# Patient Record
Sex: Female | Born: 1940 | Race: White | Hispanic: No
Health system: Midwestern US, Community
[De-identification: ages and names within clinical notes are randomized; demographics above are authoritative.]

## PROBLEM LIST (undated history)

## (undated) DIAGNOSIS — C50919 Malignant neoplasm of unspecified site of unspecified female breast: Secondary | ICD-10-CM

## (undated) DIAGNOSIS — R634 Abnormal weight loss: Secondary | ICD-10-CM

## (undated) DIAGNOSIS — R609 Edema, unspecified: Secondary | ICD-10-CM

## (undated) DIAGNOSIS — F419 Anxiety disorder, unspecified: Secondary | ICD-10-CM

## (undated) DIAGNOSIS — M25572 Pain in left ankle and joints of left foot: Secondary | ICD-10-CM

## (undated) DIAGNOSIS — Z853 Personal history of malignant neoplasm of breast: Secondary | ICD-10-CM

## (undated) DIAGNOSIS — Z780 Asymptomatic menopausal state: Secondary | ICD-10-CM

## (undated) DIAGNOSIS — G8929 Other chronic pain: Secondary | ICD-10-CM

## (undated) DIAGNOSIS — R319 Hematuria, unspecified: Secondary | ICD-10-CM

## (undated) DIAGNOSIS — R300 Dysuria: Secondary | ICD-10-CM

## (undated) DIAGNOSIS — Z1231 Encounter for screening mammogram for malignant neoplasm of breast: Secondary | ICD-10-CM

## (undated) DIAGNOSIS — Z8542 Personal history of malignant neoplasm of other parts of uterus: Secondary | ICD-10-CM

## (undated) DIAGNOSIS — L299 Pruritus, unspecified: Secondary | ICD-10-CM

## (undated) DIAGNOSIS — R079 Chest pain, unspecified: Secondary | ICD-10-CM

## (undated) DIAGNOSIS — E785 Hyperlipidemia, unspecified: Secondary | ICD-10-CM

## (undated) HISTORY — PX: BREAST BIOPSY: SHX20

## (undated) HISTORY — DX: Malignant neoplasm of unspecified site of unspecified female breast: C50.919

## (undated) HISTORY — PX: MASTECTOMY: SHX3

## (undated) HISTORY — PX: MASTECTOMY W/ SENTINEL NODE BIOPSY: SHX2001

---

## 2007-06-30 ENCOUNTER — Inpatient Hospital Stay (HOSPITAL_COMMUNITY): Admission: EM | Admit: 2007-06-30 | Discharge: 2007-07-02 | Payer: Self-pay | Admitting: Emergency Medicine

## 2008-12-14 LAB — CEA: CEA: 3.3 ng/mL (ref 0.0–5.0)

## 2008-12-16 LAB — CANCER ANTIGEN 125: CA-125: 9 U/mL (ref 0–35)

## 2008-12-16 LAB — AFP, TUMOR MARKER
AFP (TUMOR MARKER): 2 ng/mL (ref 0–9)
AFP Tumor Marker: 2 ng/mL (ref 0–9)

## 2008-12-16 LAB — HCG TUMOR MARKER: Beta-HCG(Tumor Marker): <1 IU/L (ref 0–5)

## 2008-12-16 LAB — HCG, TUMOR MARKER: BETA-HCG,HCGT: <1 IU/L (ref 0–5)

## 2009-02-09 LAB — T4, FREE: T4, Free: 1.1 NG/DL (ref 0.89–1.76)

## 2009-02-09 LAB — TSH 3RD GENERATION: TSH: 2.05 u[IU]/mL (ref 0.51–6.27)

## 2009-02-09 LAB — SED RATE (ESR): Sed rate (ESR): 5 MM/HR (ref 0–30)

## 2009-02-10 LAB — VITAMIN D, 25 HYDROXY: Vitamin D 25-Hydroxy: 25 ng/mL — ABNORMAL LOW (ref 30–80)

## 2009-02-10 LAB — ANA QL, W/REFLEX CASCADE
ANA, Direct: NOT DETECTED
ANA: NOT DETECTED

## 2009-02-11 LAB — RHEUMATOID FACTOR, QL: Rheumatoid factor, QL: NEGATIVE

## 2009-02-13 LAB — CARDIOLIPIN AND PHOSPHATIDYL AB PANEL
Cardiolipin Ab,IgA: 5 [APL'U] (ref 0–11)
Cardiolipin Ab,IgG: 15 [GPL'U] — ABNORMAL HIGH (ref 0–14)
Cardiolipin Ab,IgM: 5 [MPL'U] (ref 0–12)
Phosphatidylethanolamine,IgA: 4 U/mL (ref 0–15)
Phosphatidylethanolamine,IgG: 8 U/mL (ref 0–15)
Phosphatidylethanolamine,IgM: 9 U/mL (ref 0–15)
Phosphatidylglycerol,IgA: 0 U/mL (ref 0–15)
Phosphatidylglycerol,IgG: 4 U/mL (ref 0–15)
Phosphatidylglycerol,IgM: 4 U/mL (ref 0–15)
Phosphatidylinositol,IgA: 0 U/mL (ref 0–15)
Phosphatidylinositol,IgG: 2 U/mL (ref 0–15)
Phosphatidylinositol,IgM: 1 U/mL (ref 0–15)
Phosphatidylserine,IgA: 4 U/mL (ref 0–19)
Phosphatidylserine,IgG: 13 U/mL — ABNORMAL HIGH (ref 0–10)
Phosphatidylserine,IgM: 9 U/mL (ref 0–24)

## 2010-04-11 NOTE — Progress Notes (Signed)
Pt given flushot per Dr RD

## 2010-09-09 NOTE — Consult Note (Signed)
Jodi, Kaufman              ACCOUNT NO.:  [id number]   MEDICAL RECORD NO.:  [id number]          PATIENT TYPE:  INP   LOCATION:  0106                         FACILITY:  [facility]   PHYSICIAN:  Jordan Hawks. Elnoria Howard, MD    DATE OF BIRTH:  [birth year]   DATE OF CONSULTATION:  06/30/2007  DATE OF DISCHARGE:                                 CONSULTATION   REFERRING PHYSICIAN:  [facility] hospitalist.   REASON FOR CONSULTATION:  Hematochezia.   HISTORY OF PRESENT ILLNESS:  This is a 70 year old female with a past  medical history of gastroesophageal reflux disease and colonic polyps  and remote history of breast cancer, status post mastectomy, who was  admitted to the hospital with complaints of hematochezia.  The patient  states that it started early this morning.  Subsequently, she has had 3  bowel movements; with the first bowel movement, there was a significant  amount of clots and the subsequent 2 bowel movements have revealed a  decreased amount of bleeding.  The patient underwent a screening  colonoscopy on June 23, 2007 without any incident.  She was noted to  have 6 polyps,2  of which were rather large and 2 endoclips were placed  on 1 of the polypectomy sites.  All of the polyps were removed with  electrocautery.  Subsequently, the patient was here in [place]  visiting her daughter and she developed hematochezia.  Incidentally, the  patient does complain of having some left lower quadrant pain that has  been persistent for approximately 3 years.  Prior workup was unrevealing  for any gynecologic etiology; however, this is the first time she has  noticed any type of rectal bleeding.   PAST MEDICAL AND SURGICAL HISTORY:  As stated above.   FAMILY HISTORY:  Significant for colonic polyps in her brother.   SOCIAL HISTORY:  Negative for alcohol, tobacco or illicit drug use.   HOME MEDICATIONS:  1. Prilosec 20 mg p.o. daily.  2. Multivitamin.  3. Caltrate.   ALLERGIES:  To  AUGMENTIN and TAMOXIFEN.   REVIEW OF SYSTEMS:  Negative for the 13-point review of systems, unless  stated in the history present illness.   PHYSICAL EXAMINATION:  VITAL SIGNS:  Blood pressure is 125/69, heart  rate is 86, respirations 16, temperature is 98.4.  GENERAL:  The patient is in no acute distress, alert and oriented.  HEENT:  Normocephalic, atraumatic.  Extraocular muscles intact.  NECK:  Supple with no lymphadenopathy.  LUNGS:  Clear to auscultation bilaterally.  CARDIOVASCULAR:  Regular rate and rhythm.  ABDOMEN:  Flat, soft.  Mild tenderness in the left lower quadrant.  No  rebound or rigidity.  EXTREMITIES:  No clubbing, cyanosis or edema.   LABORATORY VALUES:  White blood cell count is 8.9, hemoglobin 13.1, MCV  is 88.5, platelets at 259,000.  PT is 13.1, INR 1, PTT 27.  Sodium 139,  potassium 4.4, chloride 106, CO2 25, glucose 104, BUN is 20, creatinine  0.69, AST is 19, ALT 14, total bilirubin 0.8, alkaline phosphatase is  59, albumin is 2.8.   IMPRESSION:  1. Probable post  polypectomy bleed.  2. Left lower quadrant pain, questionable ischemic colitis in light of      the concomitant hematochezia.  3. Diverticulosis.   After evaluation of the patient, I believe the patient is having a post  polypectomy bleed and a repeat colonoscopy is required at this time,  given the amount of blood that she reports passing, despite the  decreasing amounts of bleeding.   PLAN:  Plan at this time is to proceed with a repeat colonoscopy for  further evaluation and treatment.      Jordan Hawks Elnoria Howard, MD  Electronically Signed     PDH/MEDQ  D:  06/30/2007  T:  07/01/2007  Job:  [id number]   cc:   [address], [facility] [place], [place] [address] Amada Jupiter MD

## 2010-09-09 NOTE — Discharge Summary (Signed)
Jodi Kaufman, Jodi Kaufman              ACCOUNT NO.:  [id number]   MEDICAL RECORD NO.:  [id number]          PATIENT TYPE:  INP   LOCATION:  1433                         FACILITY:  [facility]   PHYSICIAN:  Isidor Holts, M.D.  DATE OF BIRTH:  10/07/1940   DATE OF ADMISSION:  06/30/2007  DATE OF DISCHARGE:  07/02/2007                               DISCHARGE SUMMARY   PRIMARY CARE PHYSICIAN:  Unassigned.   PRIMARY GASTROENTEROLOGIST:  Dr. Amada Jupiter, [address],  [place], [place], [address].   DISCHARGE DIAGNOSES:  1. Colonic polyposis.  2. Status post multiple colonic polypectomies June 23, 2007.  3. Post polypectomy lower gastrointestinal bleed.  4. History of diverticulosis.  5. Gastroesophageal reflux disease.   DISCHARGE MEDICATIONS:  1. Prilosec 20 mg p.o. daily.  2. Multivitamin one p.o. daily.  3. Caltrate 1-2 tablets p.o. daily.   PROCEDURES:  1. Colonoscopy performed July 01, 2007, by Dr. Jordan Hawks. Hung,      gastroenterologist.  This showed old blood upon entry into the      colon which tracked all the way up to the cecum.  Two polypectomy      sites were identified.  One site just distal to the cecal cap was      noted to have two resolution clips and the other side distal to the      initial polypectomy site.  No active bleeding was identified from      the polypectomy sites.  However, the polypectomy site with the      clips demonstrated two hemocystic spots.  Two additional resolution      clips were applied.  However, it was difficult.  The angle was      tangential to the hemocystic spots.  Additionally the fibrotic base      of the polypectomy sites made it difficult to apply the clips but      satisfactory placement was achieved.  One clip was placed on a      suspicious spot on other polypectomy site.  Some mild oozing was      noted after clip placement which was most likely from trauma of the      clip.  To ensure safety 2 mL of 1:10,000 epinephrine was  injected.      Finally a 7 mm sessile polyp was found approximately 5 cm distal to      the second polypectomy site.  This was not removed in light of      current bleeding.  Internal and external hemorrhoids were noted in      the rectum.  There were sigmoid diverticula.   CONSULTATIONS:  Jordan Hawks. Elnoria Howard, gastroenterologist.   ADMISSION HISTORY:  As in H and P notes of June 30, 2007.  However, in  brief this is a 70 year old female, with known history of right breast  cancer, diagnosed approximately 13-1/2 years ago treated with right  mastectomy.  Also colonic polyposis, diverticulosis, history of acute  diverticulitis approximately 7 years ago, GERD who presented with  recurrent hematochezia on June 30, 2007, following colonoscopy June 23, 2007, at the [facility] in [place] where a total of  6 polyps were identified and retrieved in what was described as a  difficult procedure.  She was admitted for further evaluation and  investigation management.   CLINICAL COURSE:  1. Post polypectomy lower GI bleed.  For details of presentation,      refer to admission history above.  The patient was placed on close      observation, intravenous fluids were started, bowel rest was      instituted and serial hemoglobin/hematocrits were done.  The      patient remained hemodynamically stable.  Hemoglobin remained      reasonable.  She underwent lower GI endoscopy on July 01, 2007, by      Dr. Jeani Hawking, who was called on consultation.  For the details      of findings, refer to procedure list above.  Endoclips were placed      to ensure hemostasis and an additional 7 mm sessile polyp was      identified, although this was not removed because of the patient's      recent bleed.  By July 02, 2007, the patient was completely      asymptomatic.  Hemoglobin was considered reasonable at 11.1 with a      hematocrit of 31.7.  She is recommended to follow up subsequently       within a week, with her primary gastroenterologist, Dr. Evelene Croon in [place].   1. History of colon polyps.  As mentioned above the patient was      identified to have at least one remaining 7 mm sessile polyp,      located approximately 5 cm distal to second polypectomy site.  This      was not removed in view of recent bleed.  However, it will likely      require retrieval in the future.  This will be deferred to the      patient's primary gastroenterologist.   DISPOSITION:  The patient was on July 02, 2007, considered clinically  stable for discharge.  She has been discharged accordingly.  She is  recommended to return to regular duties on July 06, 2007.  Activity:  As tolerated.  Diet:  no restrictions.   FOLLOW UP INSTRUCTIONS:  :  The patient is recommended to follow up with her primary  gastroenterologist, Dr. Evelene Croon, in [place] within 1 week of  discharge.  She is instructed to call for an appointment.      Isidor Holts, M.D.  Electronically Signed     CO/MEDQ  D:  07/02/2007  T:  07/02/2007  Job:  [id number]   cc:   Evelene Croon, Dr  [address]  [place], [place] [address]

## 2010-09-09 NOTE — H&P (Signed)
Jodi, Kaufman              ACCOUNT NO.:  [id number]   MEDICAL RECORD NO.:  [id number]          PATIENT TYPE:  INP   LOCATION:  1433                         FACILITY:  [facility]   PHYSICIAN:  Isidor Holts, M.D.  DATE OF BIRTH:  [birth year]   DATE OF ADMISSION:  06/30/2007  DATE OF DISCHARGE:                              HISTORY & PHYSICAL   PRIMARY CARE PHYSICIAN:  Unassigned.  Primary gastroenterologist, Dr.  Reva Bores. Wolfe.   CHIEF COMPLAINT:  Hematochezia with dizziness and weakness x1 day.   HISTORY OF PRESENT ILLNESS:  This is a 70 year old female, who is quite  a good historian.  She normally resides in [place], [place], but was  visiting her mother in [place] and arrived on June 29, 2007.  According to her, she is status post colonoscopy on June 23, 2007,  performed by Dr. Artis Flock at [facility] in [place].  During  this procedure, a total of six polyps were identified.  These were  treated with snare excision, hot biopsy and fulguration.  Retrieval was  described as difficult.  In the a.m. of June 30, 2007, she got up  feeling in her usual state of health, went to the bathroom, moved her  bowels and passed bright red blood, associated with dark clots, which  she described as large in quantity.  About 20-30 minutes later, she  had another bowel movement, this time with a small amount of blood.  During this process, she experienced dizziness and generalized weakness.  According to her, she then called her primary gastroenterologist in  [place], Dr. Artis Flock, who advised her to go to the emergency department  should she have a third bowel movement with blood.  Approximately 2  hours later, this occurred.  She therefore came to the emergency  department.  She denies vomiting, but does admit to left lower quadrant  abdominal discomfort.  She has no fever or chills.   PAST MEDICAL HISTORY:  1. Remote history of right breast cancer approximately 13-1/2  years      ago treated with right mastectomy.  She was unable to tolerate      Tamoxifen at the time, and follows up regularly with her primary      oncologist in [place].  2. Diverticulosis.  3. Acute diverticulitis approximately 7 years ago.  4. GERD.   MEDICATIONS:  1. Prilosec 20 mg p.o. daily.  2. Multivitamin one p.o. daily.  3. Caltrate 1-2 tablets p.o. daily.   ALLERGIES:  AUGMENTIN AND TAMOXIFEN.  These cause nausea and dizziness.   REVIEW OF SYSTEMS:  As per HPI and chief complaint, otherwise negative.   SOCIAL HISTORY:  The patient is divorced approximately 35 years now.  She is retired, but works Secretary/administrator.  Nonsmoker.  Use  to drink alcohol quite heavily approximately 25 years ago.  Lately, has  a glass of wine every couple of weeks, but has completely quit, since  approximately 4 months ago.  No history of drug abuse.  The patient has  two daughters.   FAMILY HISTORY:  The patient's mother is age 91 years  and has a history  of hypertension and coronary artery disease, status post MI.  The  patient's father died at age 91 years from lung cancer.  He was a  smoker.   FAMILY HISTORY:  Noncontributory.   PHYSICAL EXAMINATION:  VITAL SIGNS:  Temperature 98.4, pulse 86 per  minute and regular, respiratory rate 18, BP 125/69 mmHg, pulse oximeter  97% on room air.  GENERAL:  The patient does not appear to be in obvious acute distress  and at the time of this evaluation is alert, communicative, not short of  breath at rest.  HEENT:  No clinical pallor, no jaundice.  No conjunctival injection.  Throat is clear.  NECK:  Supple.  JVP not seen.  No palpable lymphadenopathy.  No palpable  goiter.  CHEST:  Clinically clear to auscultation.  No wheezes or crackles.  HEART:  S1, S2 heard.  Normal regular, no murmurs.  ABDOMEN:  Full, soft.  The patient has diffuse tenderness in both lower  quadrants.  No guarding or rebound.  However, bowel sounds are  heard.  EXTREMITIES:  No pitting edema.  Palpable peripheral pulses.  MUSCULOSKELETAL:  Osteoarthritic changes are noted.  NEUROLOGIC:  No focal neurologic deficit on gross examination.   LABORATORY DATA AND X-RAY FINDINGS:  CBC with WBC 8.9, hemoglobin 13.1,  hematocrit 37.8, platelets 259.  Electrolytes with sodium 139, potassium  4.4, chloride 106, CO2 25, BUN 20, creatinine 0.69, glucose 104.  AST  19, ALT 14, alkaline phosphatase 69.  INR 1.0, PTT 27.  Fecal occult  blood testing positive.   ASSESSMENT/PLAN:  1. Lower gastrointestinal bleed.  Differential diagnostic      considerations include diverticular bleed versus complication of      recent polypectomy.  We shall admit the patient and do serial      hemoglobin/hematocrits.  Intravenous fluids, type and save packed      red blood cells, and transfuse if indicated.  The patient would      certainly benefit from gastroenterology consultation.  We shall      request this and abide by recommendations.   1. History of gastroesophageal reflux disease.  This appears      asymptomatic at present.  Continue proton pump inhibitor treatment.   Further management will depend on clinical course.Isidor Holts, M.D.  Electronically Signed     CO/MEDQ  D:  06/30/2007  T:  07/01/2007  Job:  [id number]   cc:   Reva Bores. Artis Flock, M.D.  [phone] [address]  [facility]  [place], [place]  [address]

## 2010-11-07 NOTE — Telephone Encounter (Signed)
Pt does not want to be seen today because she is too weak but would like to be seen sometime next week. Pt has been experiencing dizziness with vomiting as well as numbness in her leg and foot. Stated that she woke up feeling very dizzy and lightheaded thought she needed some water so got some water tried to lay down and then vomited. Says she is too weak to come in to today but would like to try for next week. Pt can be reached at her home number [phone]

## 2010-11-07 NOTE — Telephone Encounter (Signed)
sched ov for next week pls  Does she want nausea med?  Ok to call in phenergan if she wants

## 2010-11-07 NOTE — Telephone Encounter (Signed)
Put on cancellation list or 1115am tues

## 2010-11-07 NOTE — Telephone Encounter (Signed)
You don't have any appt available next week. Please advise

## 2010-11-07 NOTE — Telephone Encounter (Signed)
Pt scheduled

## 2010-11-11 NOTE — Progress Notes (Signed)
70 year old white female who presents for evaluation    It's been quite a while since we saw her. She had called initially last week and she was complaining of nausea, dizziness and diarrhea. That cleared up within a day. She made the appointment and she's here to address a few other things    She reports that she's been having pain in her right shoulder for some time now, she actually has difficulty abducting the shoulder beyond a certain angle. Also, she is been having intermittent left knee pain. Lastly, she been having a numbness sensation in the left foot but really doesn't extend beyond the ankle region. She describes no back pain or classic radicular symptoms down the leg. She knows of no injury. With regards to the right shoulder or left knee, again no injury there, she really tried anything for it either.    Past Medical History   Diagnosis Date   ??? Diverticulosis    ??? Hepatomegaly    ??? GERD (gastroesophageal reflux disease)    ??? IBS (irritable bowel syndrome)    ??? Allergic rhinitis    ??? Cancer      breast   ??? Colon polyps      Past Surgical History   Procedure Date   ??? Endoscopy, colon, diagnostic    ??? Hx hysterectomy    ??? Hx mastectomy      modified right   ??? Hx tonsillectomy    ??? Hx wisdom teeth extraction      History     Social History   ??? Marital Status: Unknown     Spouse Name: N/A     Number of Children: N/A   ??? Years of Education: N/A     Occupational History   ??? Not on file.     Social History Main Topics   ??? Smoking status: Former Smoker   ??? Smokeless tobacco: Never Used   ??? Alcohol Use: No   ??? Drug Use: No   ??? Sexually Active:      Other Topics Concern   ??? Not on file     Social History Narrative   ??? No narrative on file     Allergies   Allergen Reactions   ??? Augmentin (Amoxicillin-Pot Clavulanate) Rash     No current outpatient prescriptions on file prior to visit.   REVIEW OF SYSTEMS:   Ophtho ??? no vision change or eye pain  Oral ??? no mouth pain, tongue or tooth problems   Ears ??? no hearing loss, ear pain, fullness, no swallowing problems  Cardiac ??? no CP, PND, orthopnea, edema, palpitations or syncope  Chest ??? no breast masses  Resp ??? no wheezing, chronic coughing, dyspnea  GI ??? no heartburn, nausea, vomiting, change in bowel habits, bleeding, hemorrhoids  Neuro ??? no focal weakness, numbness, paresthesias, incoordination, ataxia, involuntary movements  Endo - no polyuria, polydipsia, nocturia, hot flashes    Visit Vitals   Item Reading   ??? BP 134/78   ??? Pulse 88   ??? Temp(Src) 98.6 ??F (37 ??C) (Oral)   ??? Ht 5' 6.5" (1.689 m)   ??? Wt 147 lb (66.679 kg)   ??? BMI 23.37 kg/m2   Decreased abduction in the left shoulder right shoulder to about 45??. There was pain on palpation of the subacromial area and also the a.c. Joint. Strength, sensation, reflexes intact otherwise. The left knee showed intact range of motion, no clear effusion. There was slightly decreased soft touch in the  second and third toes off the left foot, negative straight-leg. Lungs are clear. Heart showed regular rhythm    Assessment and plan:  1. Right shoulder pain. Suggested she go see Dr. Henriette Combs group  2. Left knee pain. She wants to try nsaids for now, declined films  3. Foot numbness.  Decilned EMG for now. She is thinking of seeing Dr. Burt Knack about a second opinion on this.  4. Viral syndrome.  Resolved

## 2010-11-11 NOTE — Patient Instructions (Signed)
MyChart Activation    Thank you for requesting access to MyChart. Please follow the instructions below to securely access and download your online medical record. MyChart allows you to send messages to your doctor, view your test results, renew your prescriptions, schedule appointments, and more.    How Do I Sign Up?    1. In your internet browser, go to [email or web address]  2. Click on the First Time User? Click Here link in the Sign In box. You will be redirect to the New Member Sign Up page.  3. Enter your MyChart Access Code exactly as it appears below. You will not need to use this code after you???ve completed the sign-up process. If you do not sign up before the expiration date, you must request a new code.    MyChart Access Code: [email or web address]  Expires: 02/09/2011 11:11 AM (This is the date your MyChart access code will expire)    4. Enter the last four digits of your Social Security Number (xxxx) and Date of Birth (mm/dd/yyyy) as indicated and click Submit. You will be taken to the next sign-up page.  5. Create a MyChart ID. This will be your MyChart login ID and cannot be changed, so think of one that is secure and easy to remember.  6. Create a MyChart password. You can change your password at any time.  7. Enter your Password Reset Question and Answer. This can be used at a later time if you forget your password.   8. Enter your e-mail address. You will receive e-mail notification when new information is available in MyChart.  9. Click Sign Up. You can now view and download portions of your medical record.  10. Click the Download Summary menu link to download a portable copy of your medical information.    Additional Information    If you have questions, please visit the Frequently Asked Questions section of the MyChart website at [email or web address] Remember, MyChart is NOT to be used for urgent needs. For medical emergencies, dial 911.

## 2011-01-19 LAB — DIFFERENTIAL
Basophils Absolute: 0
Basophils Relative: 0
Eosinophils Absolute: 0
Eosinophils Relative: 0
Lymphocytes Relative: 15
Lymphs Abs: 1.4
Monocytes Absolute: 0.5
Monocytes Relative: 6
Neutro Abs: 7
Neutrophils Relative %: 78 — ABNORMAL HIGH

## 2011-01-19 LAB — COMPREHENSIVE METABOLIC PANEL
ALT: 14
AST: 19
Albumin: 3.8
Alkaline Phosphatase: 59
BUN: 20
CO2: 25
Calcium: 9.5
Chloride: 106
Creatinine, Ser: 0.69
GFR calc Af Amer: >60
GFR calc non Af Amer: >60
Glucose, Bld: 104 — ABNORMAL HIGH
Potassium: 4.4
Sodium: 139
Total Bilirubin: 0.8
Total Protein: 6.4

## 2011-01-19 LAB — BASIC METABOLIC PANEL
BUN: 10
BUN: 3 — ABNORMAL LOW
CO2: 25
CO2: 27
Calcium: 8.7
Calcium: 8.9
Chloride: 108
Chloride: 109
Creatinine, Ser: 0.73
Creatinine, Ser: 0.74
GFR calc Af Amer: >60
GFR calc Af Amer: >60
GFR calc non Af Amer: >60
GFR calc non Af Amer: >60
Glucose, Bld: 105 — ABNORMAL HIGH
Glucose, Bld: 113 — ABNORMAL HIGH
Potassium: 3.5
Potassium: 3.8
Sodium: 140
Sodium: 142

## 2011-01-19 LAB — TYPE AND SCREEN
ABO/RH(D): B NEG
Antibody Screen: POSITIVE
DAT, IgG: NEGATIVE

## 2011-01-19 LAB — PREPARE RBC (CROSSMATCH)

## 2011-01-19 LAB — CBC
HCT: 31.7 — ABNORMAL LOW
HCT: 32.5 — ABNORMAL LOW
HCT: 37.8
Hemoglobin: 11.1 — ABNORMAL LOW
Hemoglobin: 11.4 — ABNORMAL LOW
Hemoglobin: 13.1
MCHC: 34.6
MCHC: 35
MCHC: 35.1
MCV: 87.7
MCV: 88
MCV: 88.5
Platelets: 215
Platelets: 217
Platelets: 259
RBC: 3.61 — ABNORMAL LOW
RBC: 3.7 — ABNORMAL LOW
RBC: 4.27
RDW: 14.1
RDW: 14.4
RDW: 14.5
WBC: 5.2
WBC: 6.1
WBC: 8.9

## 2011-01-19 LAB — PROTIME-INR
INR: 1
Prothrombin Time: 13.4

## 2011-01-19 LAB — HEMOGLOBIN AND HEMATOCRIT, BLOOD
HCT: 34.2 — ABNORMAL LOW
Hemoglobin: 11.6 — ABNORMAL LOW

## 2011-01-19 LAB — APTT: aPTT: 27

## 2011-01-19 LAB — OCCULT BLOOD X 1 CARD TO LAB, STOOL: Fecal Occult Bld: POSITIVE

## 2011-01-19 LAB — ABO/RH: ABO/RH(D): B NEG

## 2011-10-12 ENCOUNTER — Ambulatory Visit (INDEPENDENT_AMBULATORY_CARE_PROVIDER_SITE_OTHER): Payer: Medicare Other | Admitting: Family Medicine

## 2011-10-12 ENCOUNTER — Encounter: Payer: Self-pay | Admitting: Family Medicine

## 2011-10-12 VITALS — BP 130/69 | HR 72 | Ht 67.0 in | Wt 145.0 lb

## 2011-10-12 DIAGNOSIS — Z853 Personal history of malignant neoplasm of breast: Secondary | ICD-10-CM

## 2011-10-12 DIAGNOSIS — Z13220 Encounter for screening for lipoid disorders: Secondary | ICD-10-CM

## 2011-10-12 DIAGNOSIS — R413 Other amnesia: Secondary | ICD-10-CM

## 2011-10-12 DIAGNOSIS — D6489 Other specified anemias: Secondary | ICD-10-CM

## 2011-10-12 DIAGNOSIS — Z1231 Encounter for screening mammogram for malignant neoplasm of breast: Secondary | ICD-10-CM

## 2011-10-12 DIAGNOSIS — N959 Unspecified menopausal and perimenopausal disorder: Secondary | ICD-10-CM

## 2011-10-12 DIAGNOSIS — D539 Nutritional anemia, unspecified: Secondary | ICD-10-CM

## 2011-10-12 DIAGNOSIS — R5381 Other malaise: Secondary | ICD-10-CM

## 2011-10-12 DIAGNOSIS — N3281 Overactive bladder: Secondary | ICD-10-CM | POA: Insufficient documentation

## 2011-10-12 DIAGNOSIS — R5383 Other fatigue: Secondary | ICD-10-CM

## 2011-10-12 DIAGNOSIS — E559 Vitamin D deficiency, unspecified: Secondary | ICD-10-CM

## 2011-10-12 NOTE — Telephone Encounter (Signed)
Tried to call PT. We have a received a records request to transfer records.  Ask PT if she is leaving the practice- if so, ask her if she has a new address.

## 2011-10-12 NOTE — Patient Instructions (Signed)
Please schedule an office visit from [place] testing sometime this summer Please make sure you are fasting for 8-9 hour before going for your labwork. You can drink plenty of water.  We will call you with your lab results. If you don't here from [place] in about a week then please give [place] a call at [phone].

## 2011-10-12 NOTE — Progress Notes (Signed)
Subjective:    Patient ID: Jodi Kaufman, female    DOB: 07/03/1940, 71 y.o.   MRN: [id number]  HPI  Here to estab care.  Last bone density was 5 years ago.  Was told vit D was low as well.  mammo was 2.5 years ago. Hasn't been checked since then.    She does have some chronic mild tenderness in the left lower quadrant. She had an ultrasound of her liver ureters to investigate the area per her GYN. Everything was normal and reassuring. Her pain has not progressed or gotten worse. She does have a history of overactive bladder.  History of breast cancer 18 years ago. Her last mammogram was 2 and half years ago. She says that they were following some type of cancer marker on her blood work yearly. She is intolerant to tamoxifen. She did have a mastectomy, never had radiation or chemotherapy. She does complain of chronic fatigue.  Review of Systems  Constitutional: Negative for fever, diaphoresis and unexpected weight change.  HENT: Negative for hearing loss, rhinorrhea and tinnitus.   Eyes: Positive for visual disturbance.  Respiratory: Negative for cough and wheezing.   Cardiovascular: Negative for chest pain and palpitations.  Gastrointestinal: Negative for nausea, vomiting, diarrhea and blood in stool.  Genitourinary: Negative for vaginal bleeding, vaginal discharge and difficulty urinating.       + nocturia.    Musculoskeletal: Positive for myalgias and arthralgias.  Skin: Negative for rash.  Neurological: Negative for headaches.       + memory loss.   Hematological: Negative for adenopathy. Does not bruise/bleed easily.  Psychiatric/Behavioral: Negative for disturbed wake/sleep cycle and dysphoric mood. The patient is not nervous/anxious.        BP 130/69  Pulse 72  Ht 5\' 7"  (1.702 m)  Wt 145 lb (65.772 kg)  BMI 22.71 kg/m2  SpO2 98%    Allergies  Allergen Reactions  . Clavulanic Acid Hives  . Tamoxifen Nausea Only    Past Medical History  Diagnosis Date  . Breast  cancer     Past Surgical History  Procedure Date  . Mastectomy w/ sentinel node biopsy     Right. 18 years ago.      History   Social History  . Marital Status: Single    Spouse Name: N/A    Number of Children: N/A  . Years of Education: N/A   Occupational History  . Not on file.   Social History Main Topics  . Smoking status: Not on file  . Smokeless tobacco: Not on file  . Alcohol Use: Yes     Rarley.    . Drug Use: Not on file  . Sexually Active: Not Currently   Other Topics Concern  . Not on file   Social History Narrative   Lives in [place] but stays with her daughter 1-2 weeks out of the month.      Family History  Problem Relation Age of Onset  . Heart disease Mother   . Anemia Mother     penicous anemia.   . Alzheimer's disease Mother     Outpatient Encounter Prescriptions as of 10/12/2011  Medication Sig Dispense Refill  . Calcium Citrate (CITRACAL PO) Take by mouth.      . Multiple Vitamins-Minerals (CENTRUM SILVER PO) Take by mouth.           Objective:   Physical Exam  Constitutional: She is oriented to person, place, and time. She appears well-developed and well-nourished.  HENT:  Head: Normocephalic and atraumatic.  Neck: Neck supple. No thyromegaly present.  Cardiovascular: Normal rate, regular rhythm and normal heart sounds.        No carotid or abdominal bruits.  Pulmonary/Chest: Effort normal and breath sounds normal.  Abdominal: Soft. Bowel sounds are normal. She exhibits no distension and no mass. There is tenderness. There is no rebound and no guarding.       Mild tenderness in the LLQ.   Musculoskeletal: She exhibits no edema.  Lymphadenopathy:    She has no cervical adenopathy.  Neurological: She is alert and oriented to person, place, and time.  Skin: Skin is warm and dry.  Psychiatric: She has a normal mood and affect. Her behavior is normal.          Assessment & Plan:  History of breast cancer-it's important to get her  mammogram up-to-date. It was then put in an order for this. She's also overdue for a bone density test as well.  History of vitamin D deficiency-she has been on a supplement but has not been rechecked and well over a year. We will recheck her levels.  Due for screening lipid panel.  Fatigue-will check a TSH and a CBC to rule out other types of anemia.  She's had some early memory loss. Her mother has a diagnosis of Alzheimer's. I recommend scheduling her for many mental status exam sometime this summer for further evaluation.

## 2011-10-13 NOTE — Telephone Encounter (Signed)
Tried to call PT again- no answer

## 2011-10-13 NOTE — Telephone Encounter (Signed)
Tried to call again- no answer

## 2011-10-14 NOTE — Telephone Encounter (Signed)
PT is not leaving practice- she goes to [place] frequently and wants a DR there  to have her records

## 2011-11-06 ENCOUNTER — Encounter: Payer: Self-pay | Admitting: Family Medicine

## 2011-11-06 DIAGNOSIS — E559 Vitamin D deficiency, unspecified: Secondary | ICD-10-CM | POA: Insufficient documentation

## 2011-12-14 ENCOUNTER — Encounter: Payer: Self-pay | Admitting: Family Medicine

## 2011-12-14 ENCOUNTER — Ambulatory Visit (INDEPENDENT_AMBULATORY_CARE_PROVIDER_SITE_OTHER): Payer: Medicare Other | Admitting: Family Medicine

## 2011-12-14 VITALS — BP 111/70 | HR 101 | Temp 97.7°F | Ht 67.0 in | Wt 146.0 lb

## 2011-12-14 DIAGNOSIS — R1084 Generalized abdominal pain: Secondary | ICD-10-CM

## 2011-12-14 LAB — POCT URINALYSIS DIPSTICK
Bilirubin, UA: NEGATIVE
Blood, UA: NEGATIVE
Glucose, UA: NEGATIVE
Ketones, UA: NEGATIVE
Nitrite, UA: NEGATIVE
Spec Grav, UA: 1.025
Urobilinogen, UA: 0.2
pH, UA: 6.5

## 2011-12-14 NOTE — Patient Instructions (Addendum)
We will call you with your lab results. If you don't here from us in about a week then please give us a call at [phone].  

## 2011-12-14 NOTE — Progress Notes (Signed)
  Subjective:    Patient ID: Jodi Kaufman, female    DOB: 07-08-1940, 70 y.o.   MRN: [id number]  HPI Had an excruiting pain in her stomach for 2 days on Thurs and Friday. No vomiting or diarrhea.  No nausea.  Still has gallbladder.  Started feeling some better on Sun. Ate more on Sunday.  No fever. Had something simlar before. Stared in epigastrum  Then felt like moved to her suprapubic area.  No change in BMs.     Review of Systems BP 111/70  Pulse 101  Temp 97.7 F (36.5 C) (Oral)  Ht 5\' 7"  (1.702 m)  Wt 146 lb (66.225 kg)  BMI 22.87 kg/m2    Allergies  Allergen Reactions  . Clavulanic Acid Hives  . Tamoxifen Nausea Only    Past Medical History  Diagnosis Date  . Breast cancer     Past Surgical History  Procedure Date  . Mastectomy w/ sentinel node biopsy     Right. 18 years ago.      History   Social History  . Marital Status: Single    Spouse Name: N/A    Number of Children: N/A  . Years of Education: N/A   Occupational History  . Not on file.   Social History Main Topics  . Smoking status: Never Smoker   . Smokeless tobacco: Not on file  . Alcohol Use: Yes     Rarley.    . Drug Use: Not on file  . Sexually Active: Not Currently   Other Topics Concern  . Not on file   Social History Narrative   Lives in [place] but stays with her daughter 1-2 weeks out of the month.      Family History  Problem Relation Age of Onset  . Heart disease Mother   . Anemia Mother     penicous anemia.   . Alzheimer's disease Mother     Outpatient Encounter Prescriptions as of 12/14/2011  Medication Sig Dispense Refill  . Calcium Citrate (CITRACAL PO) Take by mouth.      . Multiple Vitamins-Minerals (CENTRUM SILVER PO) Take by mouth.              Objective:   Physical Exam  Constitutional: She is oriented to person, place, and time. She appears well-developed and well-nourished.  HENT:  Head: Normocephalic and atraumatic.  Cardiovascular: Normal rate, regular  rhythm and normal heart sounds.   Pulmonary/Chest: Effort normal and breath sounds normal.  Abdominal: Soft. Bowel sounds are normal. She exhibits no distension and no mass. There is tenderness. There is no rebound and no guarding.       Generalized tenderness.   Neurological: She is alert and oriented to person, place, and time.  Skin: Skin is warm and dry.  Psychiatric: She has a normal mood and affect. Her behavior is normal.          Assessment & Plan:  ABdmominal  Pain - She is better today.  She is still tender on exam so would like to get a CBC and CMP.  If elevated consider fiurther eval of GB, though pain is diffuse.  Call if pain recurs.  Check UA as well. UA + for LE and protein.  Will send for culture.  Also consider diverticulitis, though she is better. Pancreatitis is less likely but will check amylase and lipase. Make sure staying hydrated and drinking plenty of fluids. Make sure to increase the fiber in her diet.

## 2011-12-15 ENCOUNTER — Other Ambulatory Visit: Payer: Medicare Other

## 2011-12-15 ENCOUNTER — Ambulatory Visit (INDEPENDENT_AMBULATORY_CARE_PROVIDER_SITE_OTHER): Payer: Medicare Other

## 2011-12-15 DIAGNOSIS — Z1231 Encounter for screening mammogram for malignant neoplasm of breast: Secondary | ICD-10-CM

## 2011-12-15 DIAGNOSIS — D6489 Other specified anemias: Secondary | ICD-10-CM

## 2011-12-16 ENCOUNTER — Encounter: Payer: Self-pay | Admitting: *Deleted

## 2011-12-16 LAB — URINE CULTURE: Colony Count: 30000

## 2012-01-15 NOTE — Progress Notes (Signed)
I reviewed the patient's medical history, the physician assistant's findings on physical examination, the patient's diagnoses, and treatment plan as documented in the progress note. I concur with the treatment plan as documented. There are no additional recommendations at this time.

## 2012-01-15 NOTE — Progress Notes (Signed)
HPI/History  Jamie [name] is a 71 y.o. Caucasian female c/o severe sore throat since yesteday. Pt reports loss of voice and anterior cervical glands tender.  Denies any voice muffling or exudate.  No working thermometer at home, minimal chills.  No other upper respiratory symptoms.    There is no problem list on file for this patient.    Past Medical History   Diagnosis Date   ??? Diverticulosis    ??? Hepatomegaly    ??? GERD (gastroesophageal reflux disease)    ??? IBS (irritable bowel syndrome)    ??? Allergic rhinitis    ??? Cancer      breast   ??? Colon polyps      Past Surgical History   Procedure Date   ??? Endoscopy, colon, diagnostic    ??? Hx hysterectomy    ??? Hx mastectomy      modified right   ??? Hx tonsillectomy    ??? Hx wisdom teeth extraction      History     Social History   ??? Marital Status: UNKNOWN     Spouse Name: N/A     Number of Children: N/A   ??? Years of Education: N/A     Occupational History   ??? Not on file.     Social History Main Topics   ??? Smoking status: Former Smoker   ??? Smokeless tobacco: Never Used   ??? Alcohol Use: No   ??? Drug Use: No   ??? Sexually Active:      Other Topics Concern   ??? Not on file     Social History Narrative   ??? No narrative on file     Family History   Problem Relation Age of Onset   ??? COPD Mother    ??? Cancer Father      lung   ??? Elevated Lipids Brother    ??? Other Mother      hyperthyroidism   ??? Coronary Artery Disease Mother    ??? Hypertension Mother      Current Outpatient Prescriptions   Medication Sig   ??? azithromycin (ZITHROMAX) 250 mg tablet Take two tablets today then one tablet daily. Brand name necessary.     Allergies   Allergen Reactions   ??? Augmentin (Amoxicillin-Pot Clavulanate) Rash       Review of Systems  Slight reflux symptoms, no meds.  Denies cough, wheeze, CP, SOB.  No difficulty swallowing or sensation of closure.  Other significant findings included in HPI.    Physical Examination  BP 120/70   Pulse 78   Temp 97.9 ??F (36.6 ??C)   Resp 16   Ht 5\' 6"  (1.676 m)    Wt 147 lb (66.679 kg)   BMI 23.73 kg/m2   SpO2 98%  Vitals reviewed.    General appearance - Alert and in no acute distress. Not anxious, non-diaphoretic. Pt talks in whisper.  Mental status - Appropriate mood, behavior, speech, dress, motor activity, and thought processes.  Ears - External ears appear normal bilat. Auditory canals appear normal bilat.  TMs appear normal bilat.  Nose - Good air movement. No erythema. No rhinorrhea.  Mouth - Speaks in a whisper. No trismus noted. Mucous membranes moist. Pharynx with slight erythema. No exudate or lesions noted. No pharyngeal swelling. Teeth in ill repair, multiple caries noted.  Neck - Supple without rigidity.  Lymph - No periauricular or perimandibular tenderness or swelling. There are shotty, tender anterior cervical nodes bilat.  Chest -  No tachypnea, retractions, or cyanosis. Good respiratory effort. Clear to auscultation bilat. No wheezes, rales, or rhonchi noted.   Cardiovascular - Normal rate, regular rhythm.    Assessment and Plan  1. Pharyngitis, poor dentition  ?? Discussed dental hygiene/contribution as well as other potential origins of pharyngitis.  Refuses rapid tests/cx.  ?? Will place on Azithromycin.  Pt reports brand name is fine, but intolerant to generic ingredients.    Discussed case with Dr. Tyler Pita.    PLEASE NOTE:   This document has been produced using voice recognition software. Unrecognized errors in transcription may be present.    Etta Quill [employer] [facility] [place]  [phone]  01/15/2012

## 2012-03-25 NOTE — Telephone Encounter (Signed)
Notified patient, script called into [facility].

## 2012-03-25 NOTE — Telephone Encounter (Signed)
PT has been dizzy on and off since last Saturday. Thinks it might be inner ear  Please advise.  PT# [id number]

## 2012-03-25 NOTE — Telephone Encounter (Signed)
antivert - pls call to her pharmacy

## 2012-04-05 NOTE — Telephone Encounter (Signed)
Called and talked to patient encouraging her to have a mammogram, colonoscopy, and pneumonia vaccine. Encouraged patient to call her PCP to schedule an appointment.        Last Test:    Mammogram: 11-26-11    Colonscopy: 11-25-08    Pneumonia Vaccine: 01-27-07    Pap Smear: None

## 2012-04-06 NOTE — Telephone Encounter (Signed)
Pt stated she's not feeling well. Very dizzy and not herself. She stated she's too afraid to drive because of the dizziness and she do not have anyone to bring her. She would like someone to call her and prescribe her with something else that could help her feel a little better

## 2012-04-06 NOTE — Telephone Encounter (Signed)
Tried to call patient, no answer

## 2012-04-06 NOTE — Telephone Encounter (Signed)
Cant really do that without eval   Suggest urgent care

## 2012-04-07 NOTE — Telephone Encounter (Signed)
Tried to call, no answer

## 2012-04-08 NOTE — Telephone Encounter (Signed)
Patient was notified and told to go to urgent care and afer much convincing she agreed

## 2012-04-08 NOTE — Telephone Encounter (Signed)
Have tried to call several times, with no return call. Will close encounter for now.

## 2012-05-11 LAB — AMB POC COMPLETE CBC, AUTOMATED ONCOLOGY
ABS. GRANS (POC): 5 10*3/uL (ref 1.8–9.5)
HCT (POC): 49.4 % — AB (ref 36–48)
HGB (POC): 15.4 g/dL (ref 12–16)
LYMPHOCYTES (POC): 22.9 % — AB (ref 24–44)
PLATELET (POC): 254 10*3/uL (ref 140–440)
WBC (POC): 7.1 10*3/uL (ref 4.5–13)

## 2012-05-11 NOTE — Progress Notes (Signed)
Quick Note:    Labs reviewed and noted.  ______

## 2012-05-11 NOTE — Progress Notes (Signed)
Hematology/Oncology Consult Note    Name: Jamie [name]  Date: 05/11/2012  DOB: March 22, 1941      Primary Care Provider: Dr. Tyler Pita    Jamie [name] is a 72 y.o. year old female with a history of breast cancer and uterine cancer.     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 is a 72 year old female who returns to [place] after not being seen since 2010 for her history of breast cancer and uterine cancer. She also has a history of diverticulosis, GERD and anxiety. She was originally diagnosed with Stage IIA breast cancer in July 1995, lymph node positive ER positive, s/p right mastectomy and completed 2 months of Tamoxifen. She followed up for many years but then missed many appointments.  Her most recent appointment in our clinic was in August of 2010 where she was seen for routine follow up and at that time CT scans were ordered. Since she has last been seen in clinic she has been caring for her ailing mother who is 45. She has stopped taking nexium for her GERD and recently developed some left lower abdominal pain intermittently with some reflux. She has no blood in the urine or stool and no vomiting or nausea. She denies chest pain or sob. Has been up to date with her mammograms and informs me she just had one in September 2013 in [place] where her daughter lives which was normal. She was recommended to have a DEXA scan at that time but declined. Her appetite has been stable, no significant weight loss and no noticeable breast masses or skin changes. She is not taking any other medications besides caltrate and Centrum silver multi vitamin. She is here again to re-establish care with our office.     Allergies: The patient is allergic to augmentin    Past Medical History   Diagnosis Date   ??? Diverticulosis    ??? Hepatomegaly    ??? GERD (gastroesophageal reflux disease)    ??? IBS (irritable bowel syndrome)    ??? Allergic rhinitis    ??? Cancer      breast   ??? Colon polyps      Urinary incontinence    Past Surgical History   Procedure Laterality Date   ??? Endoscopy, colon, diagnostic     ??? Hx hysterectomy     ??? Hx mastectomy and axillary lymph node dissection       modified right   ??? Hx tonsillectomy     ??? Hx wisdom teeth extraction       History     Social History   ??? Marital Status: divorced     Spouse Name: N/A     Number of Children: N/A   ??? Years of Education: N/A     Occupational History   ??? Works as a Solicitor at [employer]     Social History Main Topics   ??? Smoking status: Former Smoker   ??? Smokeless tobacco: Never Used   ??? Alcohol Use: No   ??? Drug Use: No   ??? Sexually Active:      Other Topics Concern   ??? Not on file     Social History Narrative   ??? 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 COPD Mother    ???  Cancer Father      lung   ??? Elevated Lipids Brother    ??? Other Mother      hyperthyroidism   ??? Coronary Artery Disease Mother    ??? Hypertension Mother      Current Outpatient Prescriptions   Medication Sig Dispense Refill   ??? omeprazole (PRILOSEC) 40 mg capsule Take 1 Cap by mouth daily.  30 Cap  3   ??? Caltrate +D        No current facility-administered medications for this visit.       Review of Systems  There are no additional complaints today on the multiorgan system review except for those stated above.       Objective:   BP 113/70   Pulse 78   Temp(Src) 97.9 ??F (36.6 ??C) (Oral)   Ht 5\' 6"  (1.676 m)   Wt 66.679 kg (147 lb)   BMI 23.74 kg/m2    Physical Exam:   PS/ECOG: 100  General: Well appearing, in NAD  Skin: examination of the skin reveals no bruising, rash or petechiae  HEENT: Normocephalic, atraumatic. Conjunctiva and sclera are clear. Pupils are equal, round and reactive to light. EOMs are intact. ENT without oral mucosal lesions, stomatitis or thrush  Neck: supple without lymphadenopathy, JVD or thyromegaly  Lymphatics: no palpable cervical, supraclavicular, axillary or  inguinal lymphadenopathy  Anterior chest wall and breasts. Right breast surgically absent. Left breast without mass, nipple discharge or skin retraction. No axillary adenopathy bilaterally.   Lungs: clear breath sounds bilaterally, no rhonchi or wheezes noted  Heart: Regular rate and rhythm, no murmurs, rubs or gallops, S1-S2 noted. Positive peripheral pulses bilaterally upper and lower extremities  Abdomen: soft, non-tender, non-distended, no HSM, positive bowel sounds  Extremities: without clubbing, cyanosis or edema  Neurologic: no focal deficits, steady gait, Alert and oriented x 3.  Psychologic: mood and affect are appropriate, no anxiety or depression noted    Laboratory Data:     Results for orders placed in visit on 05/11/12   AMB POC COMPLETE CBC, AUTOMATED ONCOLOGY       Result Value Range    HGB (POC) 15.4  12 - 16 g/dL    HCT (POC) 16.1 (*) 36 - 48 %    WBC (POC) 7.1  4.5 - 13 K/uL    PLATELET (POC) 254  140 - 440 K/uL    LYMPHOCYTES (POC) 22.9 (*) 24 - 44 %    ABS. GRANS (POC) 5.0  1.8 - 9.5 K/uL        There is no problem list on file for this patient.        Assessment:     1. Breast cancer    2. Uterine cancer    3. Vitamin D deficiency    4. Reflux    5. Abdominal pain          Plan:    Jamie [name] has re-established care with [place] and today I will check a CA 27.29 level. I have asked her to have her most recent mammogram sent to [place] from [place] and these will continue yearly. Because of her abdominal pain and GERD like symtoms I have recommended a CT of the abdomen and pelvis but at this time she does not want to have these done.  Therefore we discussed a trial of  Prilosec (she does not want nexium) 40 mg daily to see if this controls her symptoms. She is in agreement  that if symptoms persist  she will have the CT scan. I will also send a UA/clx because she is having some dysuria. It has been recommended to Jamie [name] that she have a DEXA scan in the near future and again has declined this. With her hx of  vitamin d deficiency and osteopenia we will address this at the next ov again. Should her CA 27.29 level come back elevated she will be called back for a much sooner apt and CT scans. We will see her in 1 month for fu unless sooner indicated.     Ms. Balin was seen by both myself and Dr. Alanda Slim in consultation today.       Follow-up Disposition:  Return in about 1 month (around 06/11/2012).   Orders Placed This Encounter   ??? METABOLIC PANEL, COMPREHENSIVE   ??? CA 27.29   ??? UA/M W/RFLX CULTURE, ROUTINE   ??? VITAMIN D, 1, 25 DIHYDROXY   ??? Collection of Blood ([facility] Draw, not [facility])   ??? CBC - Automated Hemogram/Platelet   ??? omeprazole (PRILOSEC) 40 mg capsule     Sig: Take 1 Cap by mouth daily.     Dispense:  30 Cap     Refill:  3       Kingsley Callander, NP  05/11/2012    I have assessed the patient independently and  agree with the full assessment as outlined.  Kennon Holter, MD, [facility]

## 2012-05-17 LAB — UA/M W/RFLX CULTURE, ROUTINE
Bilirubin: NEGATIVE
Blood: NEGATIVE
Glucose: NEGATIVE
Ketone: NEGATIVE
Leukocyte Esterase: NEGATIVE
Nitrites: POSITIVE — AB
Specific Gravity: 1.026 (ref 1.005–1.030)
Urobilinogen: 0.2 mg/dL (ref 0.0–1.9)
pH (UA): 8.5 — ABNORMAL HIGH (ref 5.0–7.5)

## 2012-05-17 LAB — MICROSCOPIC EXAMINATION: RBC: NONE SEEN /hpf (ref 0–?)

## 2012-05-17 LAB — METABOLIC PANEL, COMPREHENSIVE
A-G Ratio: 2.1 (ref 1.1–2.5)
ALT (SGPT): 9 IU/L (ref 0–32)
AST (SGOT): 20 IU/L (ref 0–40)
Albumin: 4.5 g/dL (ref 3.5–4.8)
Alk. phosphatase: 55 IU/L (ref 39–117)
BUN/Creatinine ratio: 20 (ref 11–26)
BUN: 19 mg/dL (ref 8–27)
Bilirubin, total: 0.9 mg/dL (ref 0.0–1.2)
CO2: 23 mmol/L (ref 19–28)
Calcium: 10 mg/dL (ref 8.6–10.2)
Chloride: 99 mmol/L (ref 97–108)
Creatinine: 0.93 mg/dL (ref 0.57–1.00)
GFR est AA: 72 mL/min/{1.73_m2} (ref >59)
GFR est non-AA: 62 mL/min/{1.73_m2} (ref >59)
GLOBULIN, TOTAL: 2.1 g/dL (ref 1.5–4.5)
Glucose: 77 mg/dL (ref 65–99)
Potassium: 4.3 mmol/L (ref 3.5–5.2)
Protein, total: 6.6 g/dL (ref 6.0–8.5)
Sodium: 142 mmol/L (ref 134–144)

## 2012-05-17 LAB — CA 27.29: CA 27.29: 36.3 U/mL (ref 0.0–38.6)

## 2012-05-17 LAB — VITAMIN D, 1, 25 DIHYDROXY: Calcitriol (Vit D 1, 25 di-OH): 45.1 pg/mL (ref 10.0–75.0)

## 2012-05-17 LAB — URINE CULTURE, ROUTINE

## 2012-05-17 NOTE — Progress Notes (Signed)
Quick Note:    Labs reviewed. Patient aware and started on Cipro 500 bid x 10 days.  ______

## 2012-05-17 NOTE — Telephone Encounter (Signed)
Jamie [name] was called and informed of her lab results and urinalysis, culture and sensitivity. She has positive proteus mirabilis in her urine >100,000 cfu and this is sensitive to Cipro. I will start her on Cipro 500 mg BID x 10 days.

## 2012-05-27 NOTE — Progress Notes (Signed)
Patient called today to inform me she has not been taking her Cipro that was prescribed. I have instead called her in Levaquin 250 mg solution QD x 10 days since the patient cannot swallow pills, has allergies to Augmentin as well as possibly bactrim. She will pick this up at [facility].

## 2012-06-13 NOTE — Addendum Note (Signed)
Addended by: Harvel Ricks on: 06/13/2012 11:06 AM     Modules accepted: Orders

## 2012-06-13 NOTE — Progress Notes (Signed)
Hematology/Oncology  Progress Note    Name: Jamie [name]  Date: 06/13/2012  DOB: 07/12/1940    RUE:[id number] S. Tyler Pita, MD    Jamie [name] is a 72 year old female who was seen for management of her UTI, breast cancer, uterine cancer, and reflux.  Current therapy ciprofloxacin, OTC Prilosec      Subjective:     The patient is here in the clinic today to inform me that she did not take the full 10 days of antibiotics with ciprofloxacin.  She'll do 5 days then stop the medication.  She also informed me that she did not take the over-the-counter Prilosec for her reflux symptoms either.  She states that her urinary symptoms did improve while taking the antibiotics.  She is still experiencing the acid reflux symptoms however.     Past Medical History   Diagnosis Date   ??? Diverticulosis    ??? Hepatomegaly    ??? GERD (gastroesophageal reflux disease)    ??? IBS (irritable bowel syndrome)    ??? Allergic rhinitis    ??? Cancer      breast   ??? Colon polyps      Past Surgical History   Procedure Laterality Date   ??? Endoscopy, colon, diagnostic     ??? Hx hysterectomy     ??? Hx mastectomy       modified right   ??? Hx tonsillectomy     ??? Hx wisdom teeth extraction       History     Social History   ??? Marital Status: UNKNOWN     Spouse Name: N/A     Number of Children: N/A   ??? Years of Education: N/A     Occupational History   ??? Not on file.     Social History Main Topics   ??? Smoking status: Former Smoker   ??? Smokeless tobacco: Never Used   ??? Alcohol Use: No   ??? Drug Use: No   ??? Sexually Active:      Other Topics Concern   ??? Not on file     Social History Narrative   ??? No narrative on file     Family History   Problem Relation Age of Onset   ??? COPD Mother    ??? Cancer Father      lung   ??? Elevated Lipids Brother    ??? Other Mother      hyperthyroidism   ??? Coronary Artery Disease Mother    ??? Hypertension Mother      Current Outpatient Prescriptions   Medication Sig Dispense Refill   ??? ciprofloxacin (CIPRO) 500 mg tablet        ??? omeprazole  (PRILOSEC) 40 mg capsule Take 1 Cap by mouth daily.  30 Cap  3   ??? azithromycin (ZITHROMAX) 250 mg tablet Take two tablets today then one tablet daily. Brand name necessary.  6 Tab  0       Review of Systems  All items on the multiple organ system review are negative, except as outlined above.    Objective:   BP 118/72   Pulse 74   Temp(Src) 97.7 ??F (36.5 ??C)   Wt 67.132 kg (148 lb)   BMI 23.9 kg/m2    Physical Exam:   Gen. Appearance: The patient is in no acute distress.  Skin: There is no bruise or rash.  HEENT: The exam is unremarkable.  Neck: Supple without lymphadenopathy or thyromegaly.  Lungs: Clear to  auscultation and percussion; there are no wheezes or rhonchi.  Heart: Regular rate and rhythm; there are no murmurs, gallops, or rubs.  Abdomen: Bowel sounds are present and normal.  There is no guarding, tenderness, or hepatosplenomegaly.  Extremities: There is no clubbing, cyanosis, or edema.  Neurologic: There are no focal neurologic deficits.  Lymphatics: There is no palpable peripheral lymphadenopathy.    Lab data:      Results for orders placed in visit on 05/11/12   METABOLIC PANEL, COMPREHENSIVE       Result Value Range    Glucose 77  65 - 99 mg/dL    BUN 19  8 - 27 mg/dL    Creatinine 5.28  4.13 - 1.00 mg/dL    GFR est non-AA 62  >24 mL/min/1.73    GFR est AA 72  >59 mL/min/1.73    BUN/Creatinine ratio 20  11 - 26    Sodium 142  134 - 144 mmol/L    Potassium 4.3  3.5 - 5.2 mmol/L    Chloride 99  97 - 108 mmol/L    CO2 23  19 - 28 mmol/L    Calcium 10.0  8.6 - 10.2 mg/dL    Protein, total 6.6  6.0 - 8.5 g/dL    Albumin 4.5  3.5 - 4.8 g/dL    GLOBULIN, TOTAL 2.1  1.5 - 4.5 g/dL    A-G Ratio 2.1  1.1 - 2.5    Bilirubin, total 0.9  0.0 - 1.2 mg/dL    Alk. phosphatase 55  39 - 117 IU/L    AST 20  0 - 40 IU/L    ALT 9  0 - 32 IU/L   CA 27.29       Result Value Range    CA 27.29 36.3  0.0 - 38.6 U/mL   UA/M W/RFLX CULTURE, ROUTINE       Result Value Range    Specific Gravity 1.026  1.005 - 1.030    pH 8.5 (*)  5.0 - 7.5    Color Yellow  Yellow    Appearance Turbid (*) Clear    Leukocyte Esterase Negative  Negative    Protein 2+ (*) Negative/Trace    Glucose Negative  Negative    Ketone Negative  Negative    Blood Negative  Negative    Bilirubin Negative  Negative    Urobilinogen 0.2  0.0 - 1.9 mg/dL    Nitrites Positive (*) Negative    Microscopic Examination See additional order      URINALYSIS REFLEX Comment     VITAMIN D, 1, 25 DIHYDROXY       Result Value Range    Calcitriol (Vit D 1, 25 di-OH) 45.1  10.0 - 75.0 pg/mL   MICROSCOPIC EXAMINATION       Result Value Range    WBC 0-5  0 -  5 /hpf    RBC None seen  0 -  3 /hpf    Epithelial cells 0-10  0 - 10 /hpf    Casts Present (*) None seen /lpf    Cast type Comment  N/A    Crystals Present (*) N/A    Crystal type Comment  N/A    Mucus Present  Not Estab.    Bacteria Few  None seen/Few   URINE CULTURE, ROUTINE       Result Value Range    Urine Culture, Routine        Value: Proteus mirabilis  Greater than 100,000 colony forming units per mL   AMB POC COMPLETE CBC, AUTOMATED ONCOLOGY       Result Value Range    HGB (POC) 15.4  12 - 16 g/dL    HCT (POC) 09.8 (*) 36 - 48 %    WBC (POC) 7.1  4.5 - 13 K/uL    PLATELET (POC) 254  140 - 440 K/uL    LYMPHOCYTES (POC) 22.9 (*) 24 - 44 %    ABS. GRANS (POC) 5.0  1.8 - 9.5 K/uL            Assessment:     1. Breast cancer    2. Uterine cancer    3. UTI (lower urinary tract infection)        Plan:   A urinalysis with reflex culture and sensitivity will be ordered today.  I have asked the patient to consider taking the over-the-counter Prilosec to see if this helps improve her symptoms of reflux.  We will also schedule the patient for a bone density test.  I will have her return to clinic in 3 months.  Orders Placed This Encounter   ??? UA/M W/RFLX CULTURE, ROUTINE   ??? METABOLIC PANEL, COMPREHENSIVE   ??? CA 27.29   ??? CANCER ANTIGEN 125   ??? AMB POC COMPLETE CBC, AUTOMATED ONCOLOGY   ??? ciprofloxacin (CIPRO) 500 mg tablet     Sig:         Kennon Holter, MD  06/13/2012

## 2012-06-14 NOTE — Addendum Note (Signed)
Addended by: Alveta Heimlich C on: 06/14/2012 12:15 PM     Modules accepted: Orders

## 2012-06-15 LAB — MICROSCOPIC EXAMINATION

## 2012-06-15 LAB — CA 27.29: CA 27.29: 35.1 U/mL (ref 0.0–38.6)

## 2012-06-15 LAB — METABOLIC PANEL, COMPREHENSIVE
A-G Ratio: 2 (ref 1.1–2.5)
ALT (SGPT): 12 IU/L (ref 0–32)
AST (SGOT): 19 IU/L (ref 0–40)
Albumin: 4.3 g/dL (ref 3.5–4.8)
Alk. phosphatase: 56 IU/L (ref 39–117)
BUN/Creatinine ratio: 24 (ref 11–26)
BUN: 20 mg/dL (ref 8–27)
Bilirubin, total: 0.6 mg/dL (ref 0.0–1.2)
CO2: 28 mmol/L (ref 19–28)
Calcium: 9.9 mg/dL (ref 8.6–10.2)
Chloride: 101 mmol/L (ref 97–108)
Creatinine: 0.83 mg/dL (ref 0.57–1.00)
GFR est AA: 83 mL/min/{1.73_m2} (ref >59)
GFR est non-AA: 72 mL/min/{1.73_m2} (ref >59)
GLOBULIN, TOTAL: 2.2 g/dL (ref 1.5–4.5)
Glucose: 67 mg/dL (ref 65–99)
Potassium: 4.3 mmol/L (ref 3.5–5.2)
Protein, total: 6.5 g/dL (ref 6.0–8.5)
Sodium: 144 mmol/L (ref 134–144)

## 2012-06-15 LAB — UA/M W/RFLX CULTURE, ROUTINE
Bilirubin: NEGATIVE
Blood: NEGATIVE
Glucose: NEGATIVE
Ketone: NEGATIVE
Leukocyte Esterase: NEGATIVE
Nitrites: NEGATIVE
Protein: NEGATIVE
Specific Gravity: 1.025 (ref 1.005–1.030)
Urobilinogen: 0.2 mg/dL (ref 0.0–1.9)
pH (UA): 8 — ABNORMAL HIGH (ref 5.0–7.5)

## 2012-06-15 LAB — CANCER ANTIGEN 125: Cancer Ag (CA) 125: 8.2 U/mL (ref 0.0–34.0)

## 2012-06-17 LAB — AMB POC COMPLETE CBC, AUTOMATED ONCOLOGY
ABS. GRANS (POC): 4.4 10*3/uL (ref 1.8–9.5)
HCT (POC): 46.1 % (ref 36–48)
HGB (POC): 15.1 g/dL (ref 12–16)
LYMPHOCYTES (POC): 27.2 % (ref 24–44)
PLATELET (POC): 253 10*3/uL (ref 140–440)
WBC (POC): 6.5 10*3/uL (ref 4.5–13)

## 2012-06-17 NOTE — Addendum Note (Signed)
Addended by: Betsey Amen. on: 06/17/2012 02:39 PM     Modules accepted: Orders

## 2012-06-20 NOTE — Progress Notes (Signed)
Quick Note:    Labs reviewed and noted.  ______

## 2013-12-06 ENCOUNTER — Other Ambulatory Visit: Payer: Self-pay | Admitting: Family Medicine

## 2013-12-06 DIAGNOSIS — Z1231 Encounter for screening mammogram for malignant neoplasm of breast: Secondary | ICD-10-CM

## 2014-01-09 ENCOUNTER — Ambulatory Visit: Payer: Medicare Other

## 2014-01-17 ENCOUNTER — Ambulatory Visit: Payer: Medicare Other

## 2014-02-07 ENCOUNTER — Other Ambulatory Visit: Payer: Self-pay | Admitting: Hematology and Oncology

## 2014-02-07 ENCOUNTER — Other Ambulatory Visit: Payer: Self-pay | Admitting: Family Medicine

## 2014-02-07 ENCOUNTER — Ambulatory Visit (INDEPENDENT_AMBULATORY_CARE_PROVIDER_SITE_OTHER): Payer: Medicare Other

## 2014-02-07 DIAGNOSIS — Z1231 Encounter for screening mammogram for malignant neoplasm of breast: Secondary | ICD-10-CM

## 2015-04-02 ENCOUNTER — Encounter: Attending: Internal Medicine | Primary: Family Medicine

## 2015-08-16 ENCOUNTER — Other Ambulatory Visit: Payer: Self-pay | Admitting: Family Medicine

## 2015-08-16 DIAGNOSIS — Z1239 Encounter for other screening for malignant neoplasm of breast: Secondary | ICD-10-CM

## 2015-08-16 DIAGNOSIS — Z139 Encounter for screening, unspecified: Secondary | ICD-10-CM

## 2015-09-18 ENCOUNTER — Ambulatory Visit: Payer: Medicare Other

## 2015-09-19 ENCOUNTER — Ambulatory Visit: Payer: Medicare Other | Admitting: Family Medicine

## 2015-10-02 ENCOUNTER — Ambulatory Visit: Payer: Medicare Other | Admitting: Family Medicine

## 2015-10-02 ENCOUNTER — Ambulatory Visit: Payer: Medicare Other

## 2015-10-09 ENCOUNTER — Ambulatory Visit: Payer: Medicare Other

## 2015-11-27 ENCOUNTER — Ambulatory Visit: Admit: 2015-11-27 | Discharge: 2015-11-27 | Payer: MEDICARE | Attending: Family | Primary: Family Medicine

## 2015-11-27 ENCOUNTER — Inpatient Hospital Stay: Admit: 2015-11-27 | Payer: MEDICARE | Primary: Family Medicine

## 2015-11-27 DIAGNOSIS — K219 Gastro-esophageal reflux disease without esophagitis: Secondary | ICD-10-CM | POA: Insufficient documentation

## 2015-11-27 DIAGNOSIS — Z8739 Personal history of other diseases of the musculoskeletal system and connective tissue: Secondary | ICD-10-CM | POA: Insufficient documentation

## 2015-11-27 DIAGNOSIS — R399 Unspecified symptoms and signs involving the genitourinary system: Secondary | ICD-10-CM

## 2015-11-27 DIAGNOSIS — E785 Hyperlipidemia, unspecified: Secondary | ICD-10-CM

## 2015-11-27 LAB — AMB POC URINALYSIS DIP STICK AUTO W/O MICRO
Glucose (UA POC): NEGATIVE
Nitrites (UA POC): NEGATIVE
Specific gravity (UA POC): 1.02 (ref 1.001–1.035)
Urobilinogen (UA POC): 0.2 (ref 0.2–1)
pH (UA POC): 7 (ref 4.6–8.0)

## 2015-11-27 MED ORDER — TRIMETHOPRIM-SULFAMETHOXAZOLE 160 MG-800 MG TAB
160-800 mg | ORAL_TABLET | Freq: Two times a day (BID) | ORAL | 0 refills | Status: AC
Start: 2015-11-27 — End: 2015-12-04

## 2015-11-27 NOTE — Patient Instructions (Addendum)
Paronychia: Care Instructions  Your Care Instructions  Paronychia (say "pair-oh-NY-kee-uh") is an infection of the skin around a fingernail or toenail. It happens when germs enter through a break in the skin. The doctor may have made a small cut in the infected area to drain the pus.  Most cases of paronychia improve in a few days. But watch your symptoms and follow your doctor's advice. Though rare, a mild case can turn into something more serious and infect your entire finger or toe. Also, it is possible for an infection to return.  Follow-up care is a key part of your treatment and safety. Be sure to make and go to all appointments, and call your doctor if you are having problems. It's also a good idea to know your test results and keep a list of the medicines you take.  How can you care for yourself at home?  ?? If your doctor told you how to care for your infected nail, follow the doctor's instructions. If you did not get instructions, follow this general advice:  ?? Wash the area with clean water 2 times a day. Don't use hydrogen peroxide or alcohol, which can slow healing.  ?? You may cover the area with a thin layer of petroleum jelly, such as Vaseline, and a nonstick bandage.  ?? Apply more petroleum jelly and replace the bandage as needed.  ?? If your doctor prescribed antibiotics, take them as directed. Do not stop taking them just because you feel better. You need to take the full course of antibiotics.  ?? Take an over-the-counter pain medicine, such as acetaminophen (Tylenol), ibuprofen (Advil, Motrin), or naproxen (Aleve). Read and follow all instructions on the label.  ?? Do not take two or more pain medicines at the same time unless the doctor told you to. Many pain medicines have acetaminophen, which is Tylenol. Too much acetaminophen (Tylenol) can be harmful.  ?? Prop up the toe or finger so that it is higher than the level of your heart. This will help with pain and swelling.   ?? Apply heat. Put a warm water bottle, heating pad set on low, or warm cloth on your finger or toe. Do not go to sleep with a heating pad on your skin.  ?? Soak the area in warm water twice a day for 15 minutes each time. After soaking, dry the area well and apply a thin layer of petroleum jelly, such as Vaseline. Put on a new bandage.  When should you call for help?  Call your doctor now or seek immediate medical care if:  ?? You have signs of new or worsening infection, such as:  ?? Increased pain, swelling, warmth, or redness.  ?? Red streaks leading from the infected skin.  ?? Pus draining from the area.  ?? A fever.  Watch closely for changes in your health, and be sure to contact your doctor if:  ?? You do not get better as expected.  Where can you learn more?  Go to http://www.healthwise.net/[facility].  Enter C435 in the search box to learn more about "Paronychia: Care Instructions."  Current as of: February 07, 2015  Content Version: 11.3  ?? 2006-2017 [employer]wise[employer]. Care instructions adapted under license by [employer] (which disclaims liability or warranty for this information). If you have questions about a medical condition or this instruction, always ask your healthcare professional. Healthwise, Incorporated disclaims any warranty or liability for your use of this information.

## 2015-11-27 NOTE — Progress Notes (Signed)
Patient was in the office today to see Kendell Bane, NP and expressed interest in resources for help. Nurse Navigator attempted to contact patient to assess and provide resources for help. Phone line remains busy, will attempt to contact at a later time.
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
## 2015-11-27 NOTE — Progress Notes (Signed)
1. Have you been to the ER, urgent care clinic or hospitalized since your last visit? NO.     2. Have you seen or consulted any other health care providers outside of the [facility] since your last visit (Include any pap smears or colon screening)? NO      Do you have an Advanced Directive? NO    Would you like information on Advanced Directives? YES

## 2015-11-28 ENCOUNTER — Inpatient Hospital Stay: Admit: 2015-11-28 | Payer: MEDICARE | Primary: Family Medicine

## 2015-11-28 ENCOUNTER — Encounter: Admit: 2015-11-28 | Discharge: 2015-11-28 | Payer: MEDICARE | Primary: Family Medicine

## 2015-11-28 DIAGNOSIS — E785 Hyperlipidemia, unspecified: Secondary | ICD-10-CM

## 2015-11-28 NOTE — Addendum Note (Signed)
Addended by: Lieutenant Diego on: 11/28/2015 09:27 AM      Modules accepted: Orders

## 2015-11-29 LAB — CBC WITH AUTOMATED DIFF
ABS. BASOPHILS: 0 10*3/uL (ref 0.0–0.06)
ABS. EOSINOPHILS: 0.1 10*3/uL (ref 0.0–0.4)
ABS. LYMPHOCYTES: 1.5 10*3/uL (ref 0.9–3.6)
ABS. MONOCYTES: 0.5 10*3/uL (ref 0.05–1.2)
ABS. NEUTROPHILS: 4.4 10*3/uL (ref 1.8–8.0)
BASOPHILS: 1 % (ref 0–2)
EOSINOPHILS: 1 % (ref 0–5)
HCT: 47.8 % — ABNORMAL HIGH (ref 35.0–45.0)
HGB: 15.4 g/dL (ref 12.0–16.0)
LYMPHOCYTES: 23 % (ref 21–52)
MCH: 29.7 PG (ref 24.0–34.0)
MCHC: 32.2 g/dL (ref 31.0–37.0)
MCV: 92.3 FL (ref 74.0–97.0)
MONOCYTES: 8 % (ref 3–10)
MPV: 11 FL (ref 9.2–11.8)
NEUTROPHILS: 67 % (ref 40–73)
PLATELET: 252 10*3/uL (ref 135–420)
RBC: 5.18 M/uL (ref 4.20–5.30)
RDW: 14.2 % (ref 11.6–14.5)
WBC: 6.5 10*3/uL (ref 4.6–13.2)

## 2015-11-29 LAB — METABOLIC PANEL, COMPREHENSIVE
A-G Ratio: 1.3 (ref 0.8–1.7)
ALT (SGPT): 13 U/L (ref 13–56)
AST (SGOT): 15 U/L (ref 15–37)
Albumin: 4 g/dL (ref 3.4–5.0)
Alk. phosphatase: 64 U/L (ref 45–117)
Anion gap: 11 mmol/L (ref 3.0–18)
BUN/Creatinine ratio: 20 (ref 12–20)
BUN: 20 MG/DL — ABNORMAL HIGH (ref 7.0–18)
Bilirubin, total: 0.9 MG/DL (ref 0.2–1.0)
CO2: 26 mmol/L (ref 21–32)
Calcium: 9.6 MG/DL (ref 8.5–10.1)
Chloride: 105 mmol/L (ref 100–108)
Creatinine: 1.01 MG/DL (ref 0.6–1.3)
GFR est AA: >60 mL/min/{1.73_m2} (ref >60)
GFR est non-AA: 54 mL/min/{1.73_m2} — ABNORMAL LOW (ref >60)
Globulin: 3 g/dL (ref 2.0–4.0)
Glucose: 96 mg/dL (ref 74–99)
Potassium: 4.3 mmol/L (ref 3.5–5.5)
Protein, total: 7 g/dL (ref 6.4–8.2)
Sodium: 142 mmol/L (ref 136–145)

## 2015-11-29 LAB — LIPID PANEL
CHOL/HDL Ratio: 3.3 (ref 0–5.0)
Cholesterol, total: 217 MG/DL — ABNORMAL HIGH (ref <200)
HDL Cholesterol: 65 MG/DL — ABNORMAL HIGH (ref 40–60)
LDL, calculated: 129.8 MG/DL — ABNORMAL HIGH (ref 0–100)
Triglyceride: 111 MG/DL (ref <150)
VLDL, calculated: 22.2 MG/DL

## 2015-11-29 LAB — CULTURE, URINE
Culture result:: 10000
Culture: 10000

## 2015-11-29 LAB — VITAMIN D, 25 HYDROXY: Vitamin D 25-Hydroxy: 23 ng/mL — ABNORMAL LOW (ref 30–100)

## 2015-11-29 LAB — TSH 3RD GENERATION: TSH: 1.36 u[IU]/mL (ref 0.36–3.74)

## 2015-11-29 NOTE — Telephone Encounter (Signed)
Jamie [name] at [facility] labs is calling to get a valid dx code for Medicare for cbc, tsh and lipid done yesterday.

## 2015-11-29 NOTE — Progress Notes (Signed)
Nurse Navigator contacted patient to evaluate what assistance the patient needed. The patient express needing assistance with her mother. She attempted to contact Assisted Living facilities with no success. Nurse Navigator provided the patient with the contact number for [facility], who provides assistance with navigating the assisted living search and placement. Patient also wanted to schedule a follow up appointment for her mother with Dr. Celesta Aver; appointment scheduled. Patient was satisfied with information and thanked Nurse Navigator for assistance and stated no other needs at this time.

## 2015-12-02 NOTE — Telephone Encounter (Signed)
Call from Shanon Brow at [facility], he is asking for a different icd-10 code for lipid, cbc and tsh, he said the ones listed aren'y payable codes for these tests with Medicare, [name]

## 2015-12-02 NOTE — Telephone Encounter (Signed)
I changed the code to [phone], this will not work?

## 2015-12-03 NOTE — Telephone Encounter (Signed)
Per Mickel Baas, it shows it has been billed successfully

## 2015-12-03 NOTE — Telephone Encounter (Signed)
Jamie [name] they are needing a DX code for the labs. Says the items put down are not billable for medicare. Please check with RD and see what he would suggest as a billable dx for the labs for Medicare.

## 2015-12-03 NOTE — Telephone Encounter (Signed)
Use hyperlipidemia, unspecified

## 2015-12-04 NOTE — Telephone Encounter (Signed)
Hyperlipidemia added for primary dx. Order faxed to [facility].

## 2015-12-04 NOTE — Telephone Encounter (Signed)
Please call Jamie [name] and let her know that I have looked over her labs.     Labs showed that she is dehydrated some so she needs to increase her water intake. Her kidney function is a little on the lower end of normal so we will watch that.     Lipid levels are elevated, we can discuss this further at upcoming appointment, however first line therapy will be diet and exercise modifications. Limit high fat foods, animal type oils, red meats, processed foods. Exercise recommendation in 150 minutes of walking weekly.     Thyroid function is normal    Vitamin D level is 23, which is low, normal is above 30. Recommend starting OTC Vitamin D 1000 international units PO daily and then we can recheck labs in the future. Also get about 15- 30 mins of sun exposure to arms and legs about 3-4 times a week.     Please follow up at scheduled appointment.     Thank you

## 2015-12-04 NOTE — Telephone Encounter (Signed)
Patient is calling for lab results.

## 2015-12-05 NOTE — Telephone Encounter (Signed)
I spoke with patient in depth regarding her results and recommendations below.  She verbalized understanding.  She stated she woke up with some dizziness this morning and wonders if it could be from the Vitamin D def.  I told her it's likely from being dehydrated and encouraged her to drink plenty of water.  She said she was also feeling sick to her stomach after drinking ensure this morning.  She said she may call the office back to be seen, I encouraged her to get some fluids in her and give it about 24 hours and see if she doesn't start to feel better.    I also talked with her about diet changes, she said she normally gets up and eats a good breakfast but then eats candy the rest of the day and drinks a lot of almond milk.  She isn't sure she can make many changes to her schedule at this point because she spends so much time taking care of her mother.  I encouraged her to think about small changes she can start to work into her schedule, she will do this and call if her other issues don't resolve.  Total time on the phone was 16 minutes.

## 2015-12-05 NOTE — Telephone Encounter (Signed)
Mail copy of lab results to PT

## 2015-12-06 NOTE — Telephone Encounter (Signed)
Not any better- still very dizzy and has a "belly ache "for 2 days- please advise

## 2015-12-08 NOTE — Telephone Encounter (Signed)
Please contact Jamie [name].  She called over the weekend and stated she has not been feeling well with "2 days of belly aches".     Please find out if she has been taking the Bactrim as prescribed. If so did she complete the prescribed 7 days worth.     Also ask her if she feels her stomach aches are coming from her history of IBS and did she start to change her diet as per our conversation she was eating very poorly.    If she started to change her diet her body may be adjusting to that.    If she would like to see me sooner that her scheduled appointment I would be more than happy to see her for concerns.

## 2015-12-09 NOTE — Telephone Encounter (Signed)
Attempted to contact patient, no answer and her voice mailbox is full.

## 2015-12-11 ENCOUNTER — Encounter: Attending: Family | Primary: Family Medicine

## 2015-12-11 NOTE — Telephone Encounter (Signed)
Tried to call again, VM is full. She cancelled her appt with Larene Beach today, Im going to done the message

## 2015-12-13 ENCOUNTER — Encounter: Attending: Family | Primary: Family Medicine

## 2016-08-14 NOTE — Telephone Encounter (Signed)
Pt called with 2 infected teeth. I checked with RD - he said schedule with DR Norma Fredrickson- I tried to schedule an OV but she said she can't come due to caring for her mother

## 2016-10-21 ENCOUNTER — Other Ambulatory Visit: Payer: Self-pay | Admitting: Family Medicine

## 2016-10-21 DIAGNOSIS — Z1239 Encounter for other screening for malignant neoplasm of breast: Secondary | ICD-10-CM

## 2016-10-23 ENCOUNTER — Ambulatory Visit: Payer: Medicare Other

## 2016-11-03 ENCOUNTER — Ambulatory Visit: Admit: 2016-11-03 | Discharge: 2016-11-03 | Payer: MEDICARE | Attending: Family | Primary: Family Medicine

## 2016-11-03 DIAGNOSIS — Z889 Allergy status to unspecified drugs, medicaments and biological substances status: Secondary | ICD-10-CM

## 2016-11-03 MED ORDER — LORATADINE 10 MG TAB
10 mg | ORAL_TABLET | Freq: Every day | ORAL | 3 refills | Status: DC
Start: 2016-11-03 — End: 2017-09-01

## 2016-11-03 MED ORDER — OMEPRAZOLE 20 MG CAP, DELAYED RELEASE
20 mg | ORAL_CAPSULE | Freq: Every day | ORAL | 3 refills | Status: DC
Start: 2016-11-03 — End: 2017-09-01

## 2016-11-03 MED ORDER — FLUTICASONE 50 MCG/ACTUATION NASAL SPRAY, SUSP
50 mcg/actuation | NASAL | 1 refills | Status: DC
Start: 2016-11-03 — End: 2017-09-01

## 2016-11-03 NOTE — Progress Notes (Signed)
1. Have you been to the ER, urgent care clinic or hospitalized since your last visit? NO.     2. Have you seen or consulted any other health care providers outside of the [facility] since your last visit (Include any pap smears or colon screening)? NO      Do you have an Advanced Directive? NO    Would you like information on Advanced Directives? NO

## 2016-11-03 NOTE — Patient Instructions (Signed)
Symptomatic Treatment:     Make sure you are staying hydrated, 6-8 glasses of water daily, warm teas with honey to soothe a sore throat, throat lozenges, cool mist humidifier    Over the counter medications:     Nasal saline rinses followed by Flonase or Nasacort 1 puff to each side of the nose to be done daily prior to bedtime to help decreased nasal congestion and post nasal drip.    Mucinex daily to help loosen chest and nasal congestion and needs to be taken daily with good hydration to provide relief.     Antihistamines (Claritin, Zyrtec, Allegra) help dry up congestion and decrease watery or itchy eyes, ear fullness if related to noninfectious fluid behind the ear drum, and itchy ears.    Sudafed- helps with congestion and cough. This medication does contain phenylephrine so be cautious while taking this medication if you have a diagnosis of elevated blood pressure.     Tylenol, Naproxsyn, Aleve can be used to help relieve pain/tenderness/headaches/fever

## 2016-11-03 NOTE — Progress Notes (Signed)
Jamie [name] is a 76 y.o. Caucasian female and presents with    Chief Complaint   Patient presents with   ??? Depression     Patient here for depression about mothers.   ??? Labs     Patient here for a physical and new labs too be done.    ??? Leg Swelling     Patient wants to reports that she has been having off and on both leg swelling.  Patient stated that she notice in the left foot is red after standing for a long period of time and that it happens have she has eaten alot of sweets.        Subjective:  HPI  She lived with her 69 year old mother who had advanced stage Dementia who recently passed away. She also reports recent death of brother from lung cancer/colon cancer. She has two daughters that are pushing her to go to the doctor. She does not want to focus on anything negative. She denies psychology referral today.    Swelling of left ankle and foot mostly to left sometimes to right.     She thinks is allergic to dust. Sneezing frequently. Last three nights placed vix vapor rub to nose and wakes feeling better with positive relief.   ??  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 She has not followed up with Dr. Nadyne Coombes since 2014. The last mammogram was 01/2014 which was unremarkable but recommended yearly screenings.  ??  Abdominal pain/history of GERD  She has complaints of abdominal pain today that has been ongoing. She has a history of IBS with constipation. Her diet consists of Reese peanut butter cups throughout the day. She does not cook. She believes that her poor food choices are the cause for the abdominal pain. She denies nausea, vomiting, diarrhea, blood in the stool. She does have a history of GERD and is not taking the recommended Prilosec. The reflux is intermittent and again she feels it is related to poor diet.     Again today she is with complaints of generalized abdominal pain that has  been intermittent for some time. She has a poor diet, states eating mostly candy except for breakfast.     Additional Concerns: none     ROS   Review of Systems   Constitutional: Negative.    Cardiovascular: Negative.    Gastrointestinal: Positive for abdominal pain and heartburn.   Skin: Negative.        Allergies   Allergen Reactions   ??? Augmentin [Amoxicillin-Pot Clavulanate] Rash   ??? Clavulanic Acid Hives   ??? Tamoxifen Nausea Only       Current Outpatient Prescriptions   Medication Sig Dispense Refill   ??? calcium citrate-vitamin D3 (CITRACAL + D) tablet Take 1 Tab by mouth daily.     ??? loratadine (CLARITIN) 10 mg tablet Take 1 Tab by mouth daily. 90 Tab 3   ??? fluticasone (FLONASE) 50 mcg/actuation nasal spray 1 puff to each nare after nasal saline rinse prior to bedtime 1 Bottle 1   ??? omeprazole (PRILOSEC) 20 mg capsule Take 1 Cap by mouth daily. 90 Cap 3       Social History     Social History   ??? Marital status: UNKNOWN     Spouse name: N/A   ??? Number of children: N/A   ??? Years of education: N/A     Occupational History   ???  Not on file.     Social History Main Topics   ??? Smoking status: Never Smoker   ??? Smokeless tobacco: Never Used   ??? 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 Drug use: No   ??? Sexual activity: No     Other Topics Concern   ??? Not on file     Social History Narrative       Past Medical History:   Diagnosis Date   ??? Allergic rhinitis    ??? Arthritis    ??? Cancer ([place])     breast   ??? Colon polyps    ??? Depression    ??? Diverticulosis    ??? GERD (gastroesophageal reflux disease)    ??? Hepatomegaly    ??? IBS (irritable bowel syndrome)        Past Surgical History:   Procedure Laterality Date   ??? ENDOSCOPY, COLON, DIAGNOSTIC     ??? HX HYSTERECTOMY     ??? HX MASTECTOMY      modified right   ??? HX TONSILLECTOMY     ??? HX WISDOM TEETH EXTRACTION         Family History   Problem Relation Age of Onset   ??? Cancer Father      lung   ??? Other Mother      hyperthyroidism    ??? Coronary Artery Disease Mother    ??? Hypertension Mother    ??? Elevated Lipids Brother    ??? Cancer Brother      colon cancer   ??? Hypertension Brother        Objective:  Vitals:    11/03/16 1418   BP: 110/70   Pulse: 87   Resp: 14   Temp: 98.7 ??F (37.1 ??C)   TempSrc: Axillary   SpO2: 97%   Weight: 146 lb 3.2 oz (66.3 kg)   Height: 5' 6"  (1.676 m)   PainSc:   0 - No pain       LABS   Results for orders placed or performed during the hospital encounter of 11/28/15   CBC WITH AUTOMATED DIFF   Result Value Ref Range    WBC 6.5 4.6 - 13.2 K/uL    RBC 5.18 4.20 - 5.30 M/uL    HGB 15.4 12.0 - 16.0 g/dL    HCT 47.8 (H) 35.0 - 45.0 %    MCV 92.3 74.0 - 97.0 FL    MCH 29.7 24.0 - 34.0 PG    MCHC 32.2 31.0 - 37.0 g/dL    RDW 14.2 11.6 - 14.5 %    PLATELET 252 135 - 420 K/uL    MPV 11.0 9.2 - 11.8 FL    NEUTROPHILS 67 40 - 73 %    LYMPHOCYTES 23 21 - 52 %    MONOCYTES 8 3 - 10 %    EOSINOPHILS 1 0 - 5 %    BASOPHILS 1 0 - 2 %    ABS. NEUTROPHILS 4.4 1.8 - 8.0 K/UL    ABS. LYMPHOCYTES 1.5 0.9 - 3.6 K/UL    ABS. MONOCYTES 0.5 0.05 - 1.2 K/UL    ABS. EOSINOPHILS 0.1 0.0 - 0.4 K/UL    ABS. BASOPHILS 0.0 0.0 - 0.06 K/UL    DF AUTOMATED     METABOLIC PANEL, COMPREHENSIVE   Result Value Ref Range    Sodium 142 136 - 145 mmol/L    Potassium 4.3 3.5 -  5.5 mmol/L    Chloride 105 100 - 108 mmol/L    CO2 26 21 - 32 mmol/L    Anion gap 11 3.0 - 18 mmol/L    Glucose 96 74 - 99 mg/dL    BUN 20 (H) 7.0 - 18 MG/DL    Creatinine 1.01 0.6 - 1.3 MG/DL    BUN/Creatinine ratio 20 12 - 20      GFR est AA >60 >60 ml/min/1.66m    GFR est non-AA 54 (L) >60 ml/min/1.723m   Calcium 9.6 8.5 - 10.1 MG/DL    Bilirubin, total 0.9 0.2 - 1.0 MG/DL    ALT (SGPT) 13 13 - 56 U/L    AST (SGOT) 15 15 - 37 U/L    Alk. phosphatase 64 45 - 117 U/L    Protein, total 7.0 6.4 - 8.2 g/dL    Albumin 4.0 3.4 - 5.0 g/dL    Globulin 3.0 2.0 - 4.0 g/dL    A-G Ratio 1.3 0.8 - 1.7     TSH 3RD GENERATION   Result Value Ref Range    TSH 1.36 0.36 - 3.74 uIU/mL   LIPID PANEL    Result Value Ref Range    LIPID PROFILE          Cholesterol, total 217 (H) <200 MG/DL    Triglyceride 111 <150 MG/DL    HDL Cholesterol 65 (H) 40 - 60 MG/DL    LDL, calculated 129.8 (H) 0 - 100 MG/DL    VLDL, calculated 22.2 MG/DL    CHOL/HDL Ratio 3.3 0 - 5.0     VITAMIN D, 25 HYDROXY   Result Value Ref Range    Vitamin D 25-Hydroxy 23.0 (L) 30 - 100 ng/mL       TESTS  none    PE  Physical Exam   Constitutional: She is oriented to person, place, and time. She appears well-developed and well-nourished. No distress.   HENT:   Head: Normocephalic and atraumatic.   Cardiovascular: Normal rate, regular rhythm, normal heart sounds and intact distal pulses.    No murmur heard.  Pulmonary/Chest: Effort normal and breath sounds normal. No respiratory distress. She has no wheezes. She has no rales. She exhibits no tenderness.   Abdominal: Soft. Bowel sounds are normal. She exhibits no distension and no mass. There is no hepatosplenomegaly, splenomegaly or hepatomegaly. There is tenderness. There is no rebound, no guarding and no CVA tenderness.   Neurological: She is alert and oriented to person, place, and time.   Skin: Skin is warm and dry. She is not diaphoretic.   Psychiatric: She has a normal mood and affect. Her behavior is normal. Judgment and thought content normal.   Vitals reviewed.    Assessment/Plan:    1. GERD/Generalized abdominal pain- CT abd/pelvis stat order and labs ordered. Highly recommended to have completed soon, r/o diverticulitis, pancreatitis, cholecystitis, history of uterine and breast cancer.  She reports will have labs completed tomorrow while fasting. Start Prilosec for acid reflux. Will call with results. She is overwhelmed with grief currently so requested to have imaging and labs completed and then follow up after completed. She reports will be going out of town to see family.     2. Bilateral lower extremity swelling with left worse than right- USKoreaduplex left lower ext ordered stat. ECHO ordered, BNP.  Highly recommended to be completed soon.    3. Seasonal allergies- Post nasal drip, trial Claritin, nasal saline rinse and Flonase.  4. Vitamin d def- lab ordered.    5. Situational depression due to grieving process related to mother and brothers death. She did not want to discuss anything negative. She did not want to finish PHQ9 screening. She refused psychology referral today. Will monitor.    Lab review: orders written for new lab studies as appropriate; see orders    Today's Visit:   Diagnoses and all orders for this visit:    1. H/O seasonal allergies  -     loratadine (CLARITIN) 10 mg tablet; Take 1 Tab by mouth daily.  -     fluticasone (FLONASE) 50 mcg/actuation nasal spray; 1 puff to each nare after nasal saline rinse prior to bedtime    2. Left leg swelling  -     ECHO ADULT COMPLETE; Future  -     CBC WITH AUTOMATED DIFF; Future  -     METABOLIC PANEL, COMPREHENSIVE; Future  -     HEMOGLOBIN A1C WITH EAG; Future  -     LIPID PANEL; Future  -     TSH 3RD GENERATION; Future  -     URINALYSIS W/ RFLX MICROSCOPIC; Future  -     LIPASE; Future  -     AMYLASE; Future  -     CT ABD PELV W WO CONT; Future  -     NT-PRO BNP; Future  -     [place] EXT NONVAS LT COMP; Future    3. Gastroesophageal reflux disease without esophagitis    4. Generalized abdominal pain  -     ECHO ADULT COMPLETE; Future  -     CBC WITH AUTOMATED DIFF; Future  -     METABOLIC PANEL, COMPREHENSIVE; Future  -     HEMOGLOBIN A1C WITH EAG; Future  -     LIPID PANEL; Future  -     TSH 3RD GENERATION; Future  -     URINALYSIS W/ RFLX MICROSCOPIC; Future  -     LIPASE; Future  -     AMYLASE; Future  -     CT ABD PELV W WO CONT; Future  -     NT-PRO BNP; Future  -     [place] EXT NONVAS LT COMP; Future    5. Vitamin D deficiency  -     VITAMIN D, 25 HYDROXY; Future    6. History of uterine cancer: hysterectomy ovaries intact    7. Situational depression    Other orders   -     omeprazole (PRILOSEC) 20 mg capsule; Take 1 Cap by mouth daily.      Health Maintenance: mammogram scheduled for this Friday, hx of breast cancer, depending on results she will follow up with Dr. Nadyne Coombes.    I have discussed the diagnosis with the patient and the intended plan as seen in the above orders.  The patient has received an after-visit summary and questions were answered concerning future plans.  I have discussed medication side effects and warnings with the patient as well. I have reviewed the plan of care with the patient, accepted their input and they are in agreement with the treatment goals.       Follow-up Disposition: Not on File   More than 1/2 of this 40 minute visit was spent in counseling and coordination of care, as described above.    Wyline Beady, FNP-C  [facility]  [address] [place] [address]  Phone: [phone]  Fax: [phone]

## 2016-11-04 ENCOUNTER — Encounter

## 2016-11-04 ENCOUNTER — Inpatient Hospital Stay: Admit: 2016-11-04 | Payer: MEDICARE | Attending: Family | Primary: Family Medicine

## 2016-11-04 ENCOUNTER — Inpatient Hospital Stay: Payer: MEDICARE | Attending: Family | Primary: Family Medicine

## 2016-11-04 DIAGNOSIS — R600 Localized edema: Secondary | ICD-10-CM

## 2016-11-04 NOTE — Progress Notes (Signed)
Please inform Jamie [name] that imaging was negative for blood clots.

## 2016-11-05 NOTE — Telephone Encounter (Signed)
Pt aware of message below and verbalized understanding. No further questions or concerns from pt at this time.

## 2016-11-05 NOTE — Telephone Encounter (Signed)
-----   Message from Lieutenant Diego, NP sent at 11/04/2016 11:26 PM EDT -----  Please inform Ms. Lemaster that imaging was negative for blood clots.

## 2016-11-06 ENCOUNTER — Ambulatory Visit (INDEPENDENT_AMBULATORY_CARE_PROVIDER_SITE_OTHER): Payer: Medicare Other

## 2016-11-06 DIAGNOSIS — Z1231 Encounter for screening mammogram for malignant neoplasm of breast: Secondary | ICD-10-CM

## 2016-11-06 DIAGNOSIS — Z1239 Encounter for other screening for malignant neoplasm of breast: Secondary | ICD-10-CM

## 2016-11-13 NOTE — Telephone Encounter (Signed)
PT is looking for test results

## 2016-11-15 NOTE — Telephone Encounter (Signed)
Mammogram was negative for malignancy, recommended yearly screening.     Please ask if she is still planning on scheduling the CT of the abdomen/pelvis and the ECHO to look at her heart as I received messages from scheduling stating that they have been unsuccessful in reaching her to schedule, if she needs number to scheduling please provide.

## 2016-11-16 NOTE — Telephone Encounter (Signed)
Tried to call home number again, it half rings and then goes to busy signal. Not sure if its also disconnected?   Letter sent

## 2016-11-16 NOTE — Telephone Encounter (Signed)
Tried to call, mobile number is disconnected  Home number is busy

## 2016-11-25 NOTE — Telephone Encounter (Signed)
Pain in left leg- she had U/S on this leg- she injured  It a couple of days after the test- pain from ankle up to thigh. I scheduled her for Monday since that was the 1st you had- wasn't sure if she needed to be seen sooner by another provider. Please advsie

## 2016-11-26 NOTE — Telephone Encounter (Signed)
Spoke with patient she stated that about 2 weeks ago she injured her leg on the door of a dishwasher. She stated that her leg has been sore since but she had very severe pain in her leg yesterday 11/25/16 and that is why she called to schedule an appointment. Since then the pain has decreased. Patient states that the pain is actually a lot better. Patient states that the leg is slightly swollen but that she often has bilateral edema in her legs. Patient stated that the area is bruised where she hit it. She is unsure if she should keep her appointment since the pain is going away. I spoke with SL who stated if patient is having pain and bruising then to just keep her current appointment and she will evaluate her on Monday 11/30/16 until then continue with rest, elevation for the swelling and tylenol or ibuprofen for pain . Patient agrees.

## 2016-11-26 NOTE — Telephone Encounter (Signed)
Please contact the patient and triage.

## 2016-11-30 ENCOUNTER — Inpatient Hospital Stay: Admit: 2016-11-30 | Payer: MEDICARE | Primary: Family Medicine

## 2016-11-30 ENCOUNTER — Ambulatory Visit: Admit: 2016-11-30 | Discharge: 2016-11-30 | Payer: MEDICARE | Attending: Family | Primary: Family Medicine

## 2016-11-30 DIAGNOSIS — M25572 Pain in left ankle and joints of left foot: Secondary | ICD-10-CM

## 2016-11-30 DIAGNOSIS — M7989 Other specified soft tissue disorders: Secondary | ICD-10-CM

## 2016-11-30 NOTE — Progress Notes (Signed)
1. Have you been to the ER, urgent care clinic or hospitalized since your last visit? NO.     2. Have you seen or consulted any other health care providers outside of the [facility] since your last visit (Include any pap smears or colon screening)? NO      Do you have an Advanced Directive? NO    Would you like information on Advanced Directives? NO

## 2016-11-30 NOTE — Progress Notes (Signed)
Jamie [name] is a 76 y.o. Caucasian female and presents with    Chief Complaint   Patient presents with   ??? Leg Pain     Patient here for left leg pain.  Patient reports it was feeling better on friday and when she went to go lay down that the pain radiated down from thigh into the toes. Patient reports pain getting worse x 1 month   ??? Foot Pain     Patient reports left foot pain after 2-3 hours of standing. Patient reports that its more often and more worse x 1 month       Subjective:  HPI   Jamie [name] presents today with complaints of left ankle pain. She reports was visiting family about 1 month ago, the dishwasher draw was down and she hit the ankle on the draw. Reports swelling and pain 12/10 on pain rating scale at the time, today 4/10 pain. She reports immediate ankle swelling, she iced the area but states the swelling decreased over two weeks. Took one dose of ibuprofen and reports some relief. Did not want to take any other medication. Denies erythema, paresthesias.     Additional Concerns: none     ROS   Review of Systems   Constitutional: Negative.    HENT: Negative.    Eyes: Negative.    Respiratory: Negative.    Cardiovascular: Negative.    Gastrointestinal: Negative.    Genitourinary: Negative.    Skin: Negative.        Allergies   Allergen Reactions   ??? Augmentin [Amoxicillin-Pot Clavulanate] Rash   ??? Clavulanic Acid Hives   ??? Tamoxifen Nausea Only       Current Outpatient Prescriptions   Medication Sig Dispense Refill   ??? calcium citrate-vitamin D3 (CITRACAL + D) tablet Take 1 Tab by mouth daily.     ??? loratadine (CLARITIN) 10 mg tablet Take 1 Tab by mouth daily. 90 Tab 3   ??? fluticasone (FLONASE) 50 mcg/actuation nasal spray 1 puff to each nare after nasal saline rinse prior to bedtime 1 Bottle 1   ??? omeprazole (PRILOSEC) 20 mg capsule Take 1 Cap by mouth daily. 90 Cap 3       Social History     Social History   ??? Marital status: UNKNOWN     Spouse name: N/A   ??? Number of children: N/A    ??? Years of education: N/A     Occupational History   ??? Not on file.     Social History Main Topics   ??? Smoking status: Never Smoker   ??? Smokeless tobacco: Never Used   ??? 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 Drug use: No   ??? Sexual activity: No     Other Topics Concern   ??? Not on file     Social History Narrative       Past Medical History:   Diagnosis Date   ??? Allergic rhinitis    ??? Arthritis    ??? Cancer ([place])     breast   ??? Colon polyps    ??? Depression    ??? Diverticulosis    ??? GERD (gastroesophageal reflux disease)    ??? Hepatomegaly    ??? IBS (irritable bowel syndrome)        Past Surgical History:   Procedure Laterality Date   ??? ENDOSCOPY, COLON, DIAGNOSTIC     ???  HX HYSTERECTOMY     ??? HX MASTECTOMY      modified right   ??? HX TONSILLECTOMY     ??? HX WISDOM TEETH EXTRACTION         Family History   Problem Relation Age of Onset   ??? Cancer Father      lung   ??? Other Mother      hyperthyroidism   ??? Coronary Artery Disease Mother    ??? Hypertension Mother    ??? Elevated Lipids Brother    ??? Cancer Brother      colon cancer   ??? Hypertension Brother      Objective:  Vitals:    11/30/16 1334   BP: 116/68   Pulse: 76   Resp: 14   Temp: 98.4 ??F (36.9 ??C)   TempSrc: Axillary   SpO2: 96%   Weight: 148 lb (67.1 kg)   Height: 5' 6"  (1.676 m)   PainSc:   5   PainLoc: Leg       LABS   Results for orders placed or performed during the hospital encounter of 11/28/15   CBC WITH AUTOMATED DIFF   Result Value Ref Range    WBC 6.5 4.6 - 13.2 K/uL    RBC 5.18 4.20 - 5.30 M/uL    HGB 15.4 12.0 - 16.0 g/dL    HCT 47.8 (H) 35.0 - 45.0 %    MCV 92.3 74.0 - 97.0 FL    MCH 29.7 24.0 - 34.0 PG    MCHC 32.2 31.0 - 37.0 g/dL    RDW 14.2 11.6 - 14.5 %    PLATELET 252 135 - 420 K/uL    MPV 11.0 9.2 - 11.8 FL    NEUTROPHILS 67 40 - 73 %    LYMPHOCYTES 23 21 - 52 %    MONOCYTES 8 3 - 10 %    EOSINOPHILS 1 0 - 5 %    BASOPHILS 1 0 - 2 %    ABS. NEUTROPHILS 4.4 1.8 - 8.0 K/UL    ABS. LYMPHOCYTES 1.5 0.9 - 3.6 K/UL     ABS. MONOCYTES 0.5 0.05 - 1.2 K/UL    ABS. EOSINOPHILS 0.1 0.0 - 0.4 K/UL    ABS. BASOPHILS 0.0 0.0 - 0.06 K/UL    DF AUTOMATED     METABOLIC PANEL, COMPREHENSIVE   Result Value Ref Range    Sodium 142 136 - 145 mmol/L    Potassium 4.3 3.5 - 5.5 mmol/L    Chloride 105 100 - 108 mmol/L    CO2 26 21 - 32 mmol/L    Anion gap 11 3.0 - 18 mmol/L    Glucose 96 74 - 99 mg/dL    BUN 20 (H) 7.0 - 18 MG/DL    Creatinine 1.01 0.6 - 1.3 MG/DL    BUN/Creatinine ratio 20 12 - 20      GFR est AA >60 >60 ml/min/1.4m    GFR est non-AA 54 (L) >60 ml/min/1.716m   Calcium 9.6 8.5 - 10.1 MG/DL    Bilirubin, total 0.9 0.2 - 1.0 MG/DL    ALT (SGPT) 13 13 - 56 U/L    AST (SGOT) 15 15 - 37 U/L    Alk. phosphatase 64 45 - 117 U/L    Protein, total 7.0 6.4 - 8.2 g/dL    Albumin 4.0 3.4 - 5.0 g/dL    Globulin 3.0 2.0 - 4.0 g/dL    A-G Ratio 1.3 0.8 - 1.7  TSH 3RD GENERATION   Result Value Ref Range    TSH 1.36 0.36 - 3.74 uIU/mL   LIPID PANEL   Result Value Ref Range    LIPID PROFILE          Cholesterol, total 217 (H) <200 MG/DL    Triglyceride 111 <150 MG/DL    HDL Cholesterol 65 (H) 40 - 60 MG/DL    LDL, calculated 129.8 (H) 0 - 100 MG/DL    VLDL, calculated 22.2 MG/DL    CHOL/HDL Ratio 3.3 0 - 5.0     VITAMIN D, 25 HYDROXY   Result Value Ref Range    Vitamin D 25-Hydroxy 23.0 (L) 30 - 100 ng/mL       TESTS  MAM Results (most recent):    Results from Hospital Encounter encounter on 06/15/06   MAM MAMMO UNI DX DIGTL   Narrative MAM 6090 - MAMMOGRAPHY UNILATERAL  - LEFT  -  Jun 15 2006  RESULTS: HISTORY:  SCREENING  FINDINGS:  CC AND MLO VIEWS OF THE LEFT BREAST ARE SUBMITTED.  COMPARISON  IS MADE TO PRIOR EXAM/S FROM 2002  2003  2005  AND 2006 .  THE R2 IMAGE  CHECKER WAS USED.  THERE IS HETEROGENEOUSLY DENSE FIBROGLANDULAR TISSUE.  NO MASSES   ARCHITECTURAL DISTORTION  SUSPICIOUS MICROCALCIFICATIONS  OR DOMINANT  AXILLARY LYMPHADENOPATHY IS SEEN.  BENIGN APPEARING CALCIFICATIONS ARE  AGAIN NOTED.   IMPRESSION:   NO RADIOGRAPHIC EVIDENCE FOR MALIGNANCY.  BI-RADS 2  BI-RADS 0:  ADDITIONAL IMAGING NECESSARY  BI-RADS 1:  NEGATIVE  BI-RADS 2:  BENIGN FINDINGS  BI-RADS 3:  PROBABLY BENIGN  BI-RADS 4:  SUSPICIOUS ABNORMALITY  BI-RADS 5:  HIGHLY SUSPICIOUS FOR MALIGNANCY  TECHNOLOGIST: Tori Milks SMOOT  INTERPRETING PHYSICIAN: Benjiman Core M.D.  ELECTRONICALLY SIGNED  BY / DATE: Benjiman Core M.D. Jun 16 2006  9:33A          PE  Physical Exam   Constitutional: She is oriented to person, place, and time.   Musculoskeletal: Normal range of motion. She exhibits no edema, tenderness or deformity.   Neurological: She is alert and oriented to person, place, and time. She has normal reflexes. No cranial nerve deficit.   Skin: Skin is warm and dry. No rash noted. No erythema. No pallor.   x1 scabbed site over lateral malleolus, no infectious symptoms   Psychiatric: She has a normal mood and affect. Her behavior is normal. Judgment and thought content normal.       Assessment/Plan:    1. Left ankle pain with trauma, possible fracture- Seems to be improving but did order xray due to length of swelling reported and continues to report intermittent bouts of pain. x1 scabbed site- no infectious symptoms noted. Rest, ice, nsaids.    Lab review: orders written for new lab studies as appropriate; see orders    Today's Visit:   Diagnoses and all orders for this visit:    1. Acute left ankle pain  -     XR ANKLE LT AP/LAT; Future      Health Maintenance: In process    I have discussed the diagnosis with the patient and the intended plan as seen in the above orders.  The patient has received an after-visit summary and questions were answered concerning future plans.  I have discussed medication side effects and warnings with the patient as well. I have reviewed the plan of care with the patient, accepted their input and they are in agreement with the  treatment goals.       Follow-up Disposition: Not on File    More than 1/2 of this 20 minute visit was spent in counseling and coordination of care, as described above.    Wyline Beady, FNP-C  [facility]  [address] [place] [address]  Phone: [phone]  Fax: [phone]

## 2016-11-30 NOTE — Progress Notes (Signed)
Please inform Jamie [name] that the ankle xray was negative for fracture or dislocation however it did show that there is a small heel spur and she did complain of some foot pain in the past with excessive standing and this may be an aggravating factor to the plantar fascia which is the connective tissue that supports the arch of the foot connecting the heel of the foot to the toes. Current recommendation is inserts in the shoes to the heel region and change in shoe along with as needed use of anti-inflammatories such as Aleve or Naprosyn.

## 2016-12-01 ENCOUNTER — Inpatient Hospital Stay: Admit: 2016-12-01 | Payer: MEDICARE | Primary: Family Medicine

## 2016-12-01 LAB — CBC WITH AUTOMATED DIFF
ABS. BASOPHILS: 0 10*3/uL (ref 0.0–0.1)
ABS. EOSINOPHILS: 0.2 10*3/uL (ref 0.0–0.4)
ABS. LYMPHOCYTES: 1.6 10*3/uL (ref 0.9–3.6)
ABS. MONOCYTES: 0.8 10*3/uL (ref 0.05–1.2)
ABS. NEUTROPHILS: 5.8 10*3/uL (ref 1.8–8.0)
BASOPHILS: 0 % (ref 0–2)
EOSINOPHILS: 2 % (ref 0–5)
HCT: 47.7 % — ABNORMAL HIGH (ref 35.0–45.0)
HGB: 15.5 g/dL (ref 12.0–16.0)
LYMPHOCYTES: 19 % — ABNORMAL LOW (ref 21–52)
MCH: 30 PG (ref 24.0–34.0)
MCHC: 32.5 g/dL (ref 31.0–37.0)
MCV: 92.4 FL (ref 74.0–97.0)
MONOCYTES: 10 % (ref 3–10)
MPV: 11.2 FL (ref 9.2–11.8)
NEUTROPHILS: 69 % (ref 40–73)
PLATELET: 262 10*3/uL (ref 135–420)
RBC: 5.16 M/uL (ref 4.20–5.30)
RDW: 14.3 % (ref 11.6–14.5)
WBC: 8.4 10*3/uL (ref 4.6–13.2)

## 2016-12-01 LAB — URINALYSIS W/ RFLX MICROSCOPIC
Bilirubin: NEGATIVE
Blood: NEGATIVE
Glucose: NEGATIVE mg/dL
Ketone: NEGATIVE mg/dL
Nitrites: NEGATIVE
Protein: NEGATIVE mg/dL
Specific gravity: 1.03 (ref 1.005–1.030)
Urobilinogen: 0.2 EU/dL (ref 0.2–1.0)
pH (UA): 6.5 (ref 5.0–8.0)

## 2016-12-01 LAB — TSH 3RD GENERATION: TSH: 1.3 u[IU]/mL (ref 0.36–3.74)

## 2016-12-01 LAB — VITAMIN D, 25 HYDROXY: Vitamin D 25-Hydroxy: 16.8 ng/mL — ABNORMAL LOW (ref 30–100)

## 2016-12-01 LAB — LIPID PANEL
CHOL/HDL Ratio: 3.2 (ref 0–5.0)
Cholesterol, total: 227 MG/DL — ABNORMAL HIGH (ref <200)
HDL Cholesterol: 70 MG/DL — ABNORMAL HIGH (ref 40–60)
LDL, calculated: 134.8 MG/DL — ABNORMAL HIGH (ref 0–100)
LDL/HDL Ratio: 1.9
Triglyceride: 111 MG/DL (ref <150)
VLDL, calculated: 22.2 MG/DL

## 2016-12-01 LAB — METABOLIC PANEL, COMPREHENSIVE
A-G Ratio: 1.2 (ref 0.8–1.7)
ALT (SGPT): 15 U/L (ref 13–56)
AST (SGOT): 12 U/L — ABNORMAL LOW (ref 15–37)
Albumin: 3.8 g/dL (ref 3.4–5.0)
Alk. phosphatase: 75 U/L (ref 45–117)
Anion gap: 8 mmol/L (ref 3.0–18)
BUN/Creatinine ratio: 27 — ABNORMAL HIGH (ref 12–20)
BUN: 24 MG/DL — ABNORMAL HIGH (ref 7.0–18)
Bilirubin, total: 0.6 MG/DL (ref 0.2–1.0)
CO2: 27 mmol/L (ref 21–32)
Calcium: 8.9 MG/DL (ref 8.5–10.1)
Chloride: 107 mmol/L (ref 100–108)
Creatinine: 0.89 MG/DL (ref 0.6–1.3)
GFR est AA: >60 mL/min/{1.73_m2} (ref >60)
GFR est non-AA: >60 mL/min/{1.73_m2} (ref >60)
Globulin: 3.3 g/dL (ref 2.0–4.0)
Glucose: 95 mg/dL (ref 74–99)
Potassium: 4.6 mmol/L (ref 3.5–5.5)
Protein, total: 7.1 g/dL (ref 6.4–8.2)
Sodium: 142 mmol/L (ref 136–145)

## 2016-12-01 LAB — NT-PRO BNP: NT pro-BNP: 56 PG/ML (ref 0–1800)

## 2016-12-01 LAB — URINE MICROSCOPIC ONLY
RBC: 0 /hpf (ref 0–5)
WBC: 0 /hpf (ref 0–4)

## 2016-12-01 LAB — HEMOGLOBIN A1C WITH EAG
Est. average glucose: 114 mg/dL
Hemoglobin A1c: 5.6 % (ref 4.2–5.6)

## 2016-12-01 LAB — AMYLASE: Amylase: 74 U/L (ref 25–115)

## 2016-12-01 LAB — LIPASE: Lipase: 206 U/L (ref 73–393)

## 2016-12-01 NOTE — Telephone Encounter (Signed)
Called and spoke to patient about the below message.  Patient verbalized understanding and requested to have this information mailed to her.  Also printed some information for Plantar Fasciitis for patient to be mailed.

## 2016-12-01 NOTE — Telephone Encounter (Signed)
-----   Message from Lieutenant Diego, NP sent at 11/30/2016 10:26 PM EDT -----  Please inform Jamie [name] that the ankle xray was negative for fracture or dislocation however it did show that there is a small heel spur and she did complain of some foot pain in the past with excessive standing and this may be an aggravating factor to the plantar fascia which is the connective tissue that supports the arch of the foot connecting the heel of the foot to the toes. Current recommendation is inserts in the shoes to the heel region and change in shoe along with as needed use of anti-inflammatories such as Aleve or Naprosyn.

## 2016-12-03 NOTE — Telephone Encounter (Signed)
No answer on home #, voice mailbox is full on mobile #.

## 2016-12-03 NOTE — Telephone Encounter (Signed)
Please inform Jamie [name] that vitamin d is low at 16 and needs to be at 30 to 100, Please take vitamin d 2000 units daily and we will recheck in the future. Her bad cholesterol is also high but her good cholesterol is also elevated which is good to help reduce risk of heart attack and stroke,however the risk of heart attack and stroke is 11.2% for you over the next 10 years so it is advised to start taking a medication to lower cholesterol. Diet is always important so limit red meat, animal oils, sweets, increase fiber in the diet. All other labs were unremarkable for pathology.

## 2016-12-04 MED ORDER — ATORVASTATIN 40 MG TAB
40 mg | ORAL_TABLET | Freq: Every day | ORAL | 0 refills | Status: DC
Start: 2016-12-04 — End: 2017-09-01

## 2016-12-04 NOTE — Telephone Encounter (Signed)
Pt given this information. She does want you to send medication for her cholesterol to the [facility] on file.    She also wants a copy of her labs mailed to her which I will do today.

## 2016-12-04 NOTE — Telephone Encounter (Signed)
Pt called the office today requesting her results.  I tried to call her back and she isn't available.  Same as yesterday, her mailbox is full and no answer on home #.  I'm going to close this message out because we have 2 encounters in the inbox for her with the same message in each.

## 2016-12-04 NOTE — Telephone Encounter (Signed)
Please inform that I sent in Lipitor 40 mg daily. Please let the patient know that I will need her to follow up in 2 weeks with me and this medication will need to be monitored through labs about every 3-4 months. I also sent in Vitamin d to see if will be covered by insurance. Please schedule her.

## 2016-12-04 NOTE — Telephone Encounter (Signed)
I tried calling her back.  No answer on home phone and her voice mailbox is full on her mobile #.  I tried reaching her yesterday as well and it was the same scenario.  Her results message is below.      ?? Lieutenant Diego, NP 23 hours ago (1:32 PM)   ??    ??   ??    Please inform Jamie [name] that vitamin d is low at 16 and needs to be at 30 to 100, Please take vitamin d 2000 units daily and we will recheck in the future. Her bad cholesterol is also high but her good cholesterol is also elevated which is good to help reduce risk of heart attack and stroke,however the risk of heart attack and stroke is 11.2% for you over the next 10 years so it is advised to start taking a medication to lower cholesterol. Diet is always important so limit red meat, animal oils, sweets, increase fiber in the diet. All other labs were unremarkable for pathology.  ??  ??   ??  ??   ?? Documentation

## 2016-12-04 NOTE — Telephone Encounter (Signed)
Patient request a call back to go over lab results from 11/30/16    Please advise patient.

## 2016-12-07 NOTE — Telephone Encounter (Signed)
Called and spoke to patient about message below.  Patient stated that she is going out of town the last week of August so scheduled a follow up appointment on Friday August 24th at 1:30pm.  Patient verbalized understanding with no additional questions.

## 2016-12-18 ENCOUNTER — Encounter: Attending: Family | Primary: Family Medicine

## 2016-12-31 ENCOUNTER — Encounter: Attending: Family | Primary: Family Medicine

## 2017-05-05 ENCOUNTER — Encounter: Attending: Family | Primary: Family Medicine

## 2017-06-03 ENCOUNTER — Telehealth

## 2017-06-03 NOTE — Telephone Encounter (Signed)
She has a hx of breast ca and uterine ca per Dr Rolla Plate last note in 2014

## 2017-06-03 NOTE — Telephone Encounter (Signed)
Pt needs a referral to Dr Nadyne Coombes, oncologist.  Michela Pitcher she contacted their office, since she was a former patient, but they said since it has been so long since she was last there, they need a referral from PCP.

## 2017-06-03 NOTE — Telephone Encounter (Signed)
Why does she need this referral? What is going on?

## 2017-06-06 NOTE — Addendum Note (Signed)
Addended by: Lieutenant Diego on: 06/06/2017 09:19 PM     Modules accepted: Orders

## 2017-06-06 NOTE — Telephone Encounter (Signed)
Yes she is with this history however she has not followed up since that time so I wanted to make sure no acute issues but I will refer.

## 2017-06-14 ENCOUNTER — Encounter: Attending: Family | Primary: Family Medicine

## 2017-06-21 ENCOUNTER — Encounter: Attending: Family | Primary: Family Medicine

## 2017-09-01 ENCOUNTER — Ambulatory Visit: Attending: Family | Primary: Family Medicine

## 2017-09-01 ENCOUNTER — Ambulatory Visit: Admit: 2017-09-01 | Discharge: 2017-09-01 | Payer: MEDICARE | Attending: Family | Primary: Family Medicine

## 2017-09-01 DIAGNOSIS — Z853 Personal history of malignant neoplasm of breast: Secondary | ICD-10-CM

## 2017-09-01 LAB — CBC AND DIFFERENTIAL
HCT: 48 — AB (ref 36–46)
Hemoglobin: 15.5 (ref 12.0–16.0)
Platelets: 262 (ref 150–399)
WBC: 8.4

## 2017-09-01 LAB — HEPATIC FUNCTION PANEL
ALT: 15 (ref 7–35)
AST: 12 — AB (ref 13–35)
Alkaline Phosphatase: 75 (ref 25–125)
Bilirubin, Total: 0.6

## 2017-09-01 LAB — BASIC METABOLIC PANEL
BUN: 24 — AB (ref 4–21)
Creatinine: 0.9 (ref 0.5–1.1)
Glucose: 95
Potassium: 4.6 (ref 3.4–5.3)
Sodium: 142 (ref 137–147)

## 2017-09-01 LAB — LIPID PANEL
Cholesterol: 227 — AB (ref 0–200)
HDL: 70 (ref 35–70)
LDL Cholesterol: 135
Triglycerides: 111 (ref 40–160)

## 2017-09-01 LAB — TSH: TSH: 1.3 (ref 0.41–5.90)

## 2017-09-01 LAB — VITAMIN D 25 HYDROXY (VIT D DEFICIENCY, FRACTURES): Vit D, 25-Hydroxy: 16.8

## 2017-09-01 MED ORDER — DIPHTH,PERTUS(ACEL)TETANUS VAC(PF) 2 LF-(5-3-5MCG)-5 LF/0.5 ML IM SUSP
2 Lf-(.5-5-3-5 mcg)-5Lf/0.5 mL | INJECTION | Freq: Once | INTRAMUSCULAR | 0 refills | Status: AC
Start: 2017-09-01 — End: 2017-09-01

## 2017-09-01 MED ORDER — VARICELLA-ZOSTER GLYCOE VACC-AS01B ADJ(PF) 50 MCG/0.5 ML IM SUSPENSION
50 mcg/0.5 mL | INTRAMUSCULAR | 1 refills | Status: DC
Start: 2017-09-01 — End: 2018-05-06

## 2017-09-01 NOTE — Progress Notes (Signed)
Jamie [name] presents today for   Chief Complaint   Patient presents with   ??? Fatigue              Depression Screening:  3 most recent PHQ Screens 11/03/2016   Little interest or pleasure in doing things Nearly every day   Feeling down, depressed, irritable, or hopeless Nearly every day   Total Score PHQ 2 6       Learning Assessment:  Learning Assessment 11/27/2015   PRIMARY LEARNER Patient   HIGHEST LEVEL OF EDUCATION - PRIMARY LEARNER  SOME COLLEGE   BARRIERS PRIMARY LEARNER FINANCIAL   PRIMARY LANGUAGE ENGLISH   LEARNER PREFERENCE PRIMARY DEMONSTRATION     LISTENING     PICTURES     READING     VIDEOS   ANSWERED BY Patient   RELATIONSHIP SELF       Abuse Screening:  Abuse Screening Questionnaire 11/27/2015   Do you ever feel afraid of your partner? N   Are you in a relationship with someone who physically or mentally threatens you? N   Is it safe for you to go home? Y       Fall Risk  Fall Risk Assessment, last 12 mths 11/03/2016   Able to walk? Yes   Fall in past 12 months? No           Coordination of Care:  1. Have you been to the ER, urgent care clinic since your last visit? no  Hospitalized since your last visit? no    2. Have you seen or consulted any other health care providers outside of [facility] since your last visit?Include any pap smears or colon screening. no

## 2017-09-01 NOTE — Progress Notes (Signed)
Jamie [name] is a 77 y.o. Caucasian female and presents with    Chief Complaint   Patient presents with   ??? Fatigue     reports being tired all the time, reports tremors and shakes in hands       Subjective:  HPI   Jamie [name] presents today for fatigue. She would not let the nurse take a temperature, complained about BP procedure, she is unable to fill out the depression questionnaire, see media for notes. She lost her mother over a year ago, reports with poor appetite, little energy and sometimes feels down but then reports no money to do pleasurable.  She is putting question marks by feeling bad about herself but denies SI/HI, she is putting question marks by trouble concentrating. This is a very difficult examination. She is reporting the loss of her mother and brother passed 5 days later, she reports it took a tole on her. She is learning how to cook, she continues to consume too much Reeses cups but reports cut down.       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 She has not followed up with Dr. Nadyne Coombes since 2014. The last mammogram was 01/2014 which was unremarkable but??recommended yearly screenings. 10/2016 completed mammogram and was negative for malignancy requesting yearly screenings.   ??  Abdominal pain/history of GERD  She had complaints of abdominal pain that had been ongoing. She has a history of IBS with constipation. Her diet consisted in the past of Reese peanut butter cups throughout the day. She does not cook. She believes that her poor food choices are the cause for the abdominal pain. She denies nausea, vomiting, diarrhea, blood in the stool. She does have a history of GERD and is not taking the recommended Prilosec. The reflux is intermittent and again she feels it is related to poor diet.   ??      Additional Concerns: none     ROS   Review of Systems   Constitutional: Positive for malaise/fatigue.       Allergies    Allergen Reactions   ??? Augmentin [Amoxicillin-Pot Clavulanate] Rash   ??? Clavulanic Acid Hives   ??? Tamoxifen Nausea Only       Current Outpatient Medications   Medication Sig Dispense Refill   ??? multivit-min/iron/folic/lutein (CENTRUM SILVER WOMEN PO) Take  by mouth.     ??? varicella-zoster recombinant, PF, (SHINGRIX, PF,) 50 mcg/0.5 mL susr injection First injection IM then second in 2 to 6 months after first 0.5 mL 1   ??? calcium citrate-vitamin D3 (CITRACAL + D) tablet Take 1 Tab by mouth two (2) times a day.         Social History     Socioeconomic History   ??? Marital status: UNKNOWN     Spouse name: Not on file   ??? Number of children: Not on file   ??? Years of education: Not on file   ??? Highest education level: Not on file   Occupational History   ??? Not on file   Social Needs   ??? Financial resource strain: Not on file   ??? Food insecurity:     Worry: Not on file     Inability: Not on file   ??? Transportation needs:     Medical: Not on file     Non-medical: Not on file   Tobacco Use   ??? Smoking status: Never Smoker   ???  Smokeless tobacco: Never Used   Substance and Sexual Activity   ??? 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 Drug use: No   ??? Sexual activity: Never   Lifestyle   ??? Physical activity:     Days per week: Not on file     Minutes per session: Not on file   ??? Stress: Not on file   Relationships   ??? Social connections:     Talks on phone: Not on file     Gets together: Not on file     Attends religious service: Not on file     Active member of club or organization: Not on file     Attends meetings of clubs or organizations: Not on file     Relationship status: Not on file   ??? Intimate partner violence:     Fear of current or ex partner: Not on file     Emotionally abused: Not on file     Physically abused: Not on file     Forced sexual activity: Not on file   Other Topics Concern   ??? Not on file   Social History Narrative   ??? Not on file       Past Medical History:    Diagnosis Date   ??? Allergic rhinitis    ??? Arthritis    ??? Cancer ([place])     breast   ??? Colon polyps    ??? Depression    ??? Diverticulosis    ??? GERD (gastroesophageal reflux disease)    ??? Hepatomegaly    ??? IBS (irritable bowel syndrome)        Past Surgical History:   Procedure Laterality Date   ??? ENDOSCOPY, COLON, DIAGNOSTIC     ??? HX HYSTERECTOMY     ??? HX MASTECTOMY      modified right   ??? HX TONSILLECTOMY     ??? HX WISDOM TEETH EXTRACTION         Family History   Problem Relation Age of Onset   ??? Cancer Father         lung   ??? Other Mother         hyperthyroidism   ??? Coronary Artery Disease Mother    ??? Hypertension Mother    ??? Elevated Lipids Brother    ??? Cancer Brother         colon cancer   ??? Hypertension Brother      Objective:  Vitals:    09/01/17 0937   BP: 132/78   Pulse: 74   Resp: 14   SpO2: 97%   Weight: 144 lb (65.3 kg)   Height: 5' 6"  (1.676 m)   PainSc:   0 - No pain       LABS   Results for orders placed or performed during the hospital encounter of 11/30/16   CBC WITH AUTOMATED DIFF   Result Value Ref Range    WBC 8.4 4.6 - 13.2 K/uL    RBC 5.16 4.20 - 5.30 M/uL    HGB 15.5 12.0 - 16.0 g/dL    HCT 47.7 (H) 35.0 - 45.0 %    MCV 92.4 74.0 - 97.0 FL    MCH 30.0 24.0 - 34.0 PG    MCHC 32.5 31.0 - 37.0 g/dL    RDW 14.3 11.6 - 14.5 %    PLATELET 262 135 - 420 K/uL  MPV 11.2 9.2 - 11.8 FL    NEUTROPHILS 69 40 - 73 %    LYMPHOCYTES 19 (L) 21 - 52 %    MONOCYTES 10 3 - 10 %    EOSINOPHILS 2 0 - 5 %    BASOPHILS 0 0 - 2 %    ABS. NEUTROPHILS 5.8 1.8 - 8.0 K/UL    ABS. LYMPHOCYTES 1.6 0.9 - 3.6 K/UL    ABS. MONOCYTES 0.8 0.05 - 1.2 K/UL    ABS. EOSINOPHILS 0.2 0.0 - 0.4 K/UL    ABS. BASOPHILS 0.0 0.0 - 0.1 K/UL    DF AUTOMATED     METABOLIC PANEL, COMPREHENSIVE   Result Value Ref Range    Sodium 142 136 - 145 mmol/L    Potassium 4.6 3.5 - 5.5 mmol/L    Chloride 107 100 - 108 mmol/L    CO2 27 21 - 32 mmol/L    Anion gap 8 3.0 - 18 mmol/L    Glucose 95 74 - 99 mg/dL    BUN 24 (H) 7.0 - 18 MG/DL     Creatinine 0.89 0.6 - 1.3 MG/DL    BUN/Creatinine ratio 27 (H) 12 - 20      GFR est AA >60 >60 ml/min/1.63m    GFR est non-AA >60 >60 ml/min/1.716m   Calcium 8.9 8.5 - 10.1 MG/DL    Bilirubin, total 0.6 0.2 - 1.0 MG/DL    ALT (SGPT) 15 13 - 56 U/L    AST (SGOT) 12 (L) 15 - 37 U/L    Alk. phosphatase 75 45 - 117 U/L    Protein, total 7.1 6.4 - 8.2 g/dL    Albumin 3.8 3.4 - 5.0 g/dL    Globulin 3.3 2.0 - 4.0 g/dL    A-G Ratio 1.2 0.8 - 1.7     HEMOGLOBIN A1C WITH EAG   Result Value Ref Range    Hemoglobin A1c 5.6 4.2 - 5.6 %    Est. average glucose 114 mg/dL   LIPID PANEL   Result Value Ref Range    LIPID PROFILE          Cholesterol, total 227 (H) <200 MG/DL    Triglyceride 111 <150 MG/DL    HDL Cholesterol 70 (H) 40 - 60 MG/DL    LDL, calculated 134.8 (H) 0 - 100 MG/DL    VLDL, calculated 22.2 MG/DL    CHOL/HDL Ratio 3.2 0 - 5.0      LDL/HDL Ratio 1.9     TSH 3RD GENERATION   Result Value Ref Range    TSH 1.30 0.36 - 3.74 uIU/mL   URINALYSIS W/ RFLX MICROSCOPIC   Result Value Ref Range    Color YELLOW      Appearance CLOUDY      Specific gravity 1.030 1.005 - 1.030      pH (UA) 6.5 5.0 - 8.0      Protein NEGATIVE  NEG mg/dL    Glucose NEGATIVE  NEG mg/dL    Ketone NEGATIVE  NEG mg/dL    Bilirubin NEGATIVE  NEG      Blood NEGATIVE  NEG      Urobilinogen 0.2 0.2 - 1.0 EU/dL    Nitrites NEGATIVE  NEG      Leukocyte Esterase TRACE (A) NEG     LIPASE   Result Value Ref Range    Lipase 206 73 - 393 U/L   AMYLASE   Result Value Ref Range    Amylase 74 25 -  115 U/L   NT-PRO BNP   Result Value Ref Range    NT pro-BNP 56 0 - 1,800 PG/ML   VITAMIN D, 25 HYDROXY   Result Value Ref Range    Vitamin D 25-Hydroxy 16.8 (L) 30 - 100 ng/mL   URINE MICROSCOPIC ONLY   Result Value Ref Range    WBC 0 to 3 0 - 4 /hpf    RBC 0 to 3 0 - 5 /hpf    Epithelial cells 1+ 0 - 5 /lpf    Triple Phosphate crystals 1+ (A) NEG       TESTS  none    PE  Physical Exam   Constitutional: She is oriented to person, place, and time. She appears  well-developed and well-nourished. No distress.   HENT:   Head: Normocephalic and atraumatic.   Right Ear: External ear normal.   Left Ear: External ear normal.   Nose: Nose normal.   Mouth/Throat: Oropharynx is clear and moist. Abnormal dentition. No oropharyngeal exudate.   Neck: Normal range of motion.   Cardiovascular: Normal rate, regular rhythm, normal heart sounds and intact distal pulses.   Pulmonary/Chest: Effort normal and breath sounds normal. No respiratory distress. She has no wheezes. She has no rales. She exhibits no tenderness.   Abdominal: Soft. Bowel sounds are normal.   Musculoskeletal: Normal range of motion.   Lymphadenopathy:     She has no cervical adenopathy.   Neurological: She is alert and oriented to person, place, and time. She has normal reflexes.   Skin: Skin is warm and dry. She is not diaphoretic.   Psychiatric: She has a normal mood and affect. Her behavior is normal. Judgment and thought content normal.       Assessment/Plan:    1. Chronic fatigue due to the loss of her mother/brother, she reports lack of income to get better. She is with life stressors related to the loss of her mother, poor diet, lack of hydration. She reports starting to learn how to cook because her mother used to do all the cooking prior to dementia. She is very anxietal. She reports eating a little bit better. Labs today. Mammogram ordered. DEXA scan ordered. Ref to Ophthalmology. Labs ordered for next week with fasting.     2. Urge incontinence- She may be interested in the future for PT therapy.       Lab review: orders written for new lab studies as appropriate; see orders    Today's Visit:   Diagnoses and all orders for this visit:    1. 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 Screening mammogram for high-risk patient  -     MAM MAMMO BI SCREENING INCL CAD; Future    3. Chronic fatigue   -     CBC WITH AUTOMATED DIFF; Future  -     METABOLIC PANEL, COMPREHENSIVE; Future  -     LIPID PANEL; Future  -     TSH 3RD GENERATION; Future  -     URINALYSIS W/ RFLX MICROSCOPIC; Future  -     VITAMIN D, 25 HYDROXY; Future    4. Vitamin D deficiency  -     VITAMIN D, 25 HYDROXY; Future    5. History of osteopenia T score -1.9 (2008)   -     DEXA BONE DENSITY STUDY AXIAL; Future  6. Hyperlipidemia, unspecified hyperlipidemia type  -     LIPID PANEL; Future    7. Polyuria  -     URINALYSIS W/ RFLX MICROSCOPIC; Future    8. Postmenopausal  -     DEXA BONE DENSITY STUDY AXIAL; Future    9. Screening for glaucoma  -     REFERRAL TO OPHTHALMOLOGY    Other orders  -     diph,Pertuss,Acell,,Tet Vac-PF (ADACEL) 2 Lf-(2.5-5-3-5 mcg)-5Lf/0.5 mL susp; 0.5 mL by IntraMUSCular route once for 1 dose.  -     varicella-zoster recombinant, PF, (SHINGRIX, PF,) 50 mcg/0.5 mL susr injection; First injection IM then second in 2 to 6 months after first      Health Maintenance: Tdap, shingrix, pneumococcal overdue, glaucoma screening overdue, needs Medicare Wellness.     I have discussed the diagnosis with the patient and the intended plan as seen in the above orders.  The patient has received an after-visit summary and questions were answered concerning future plans.  I have discussed medication side effects and warnings with the patient as well. I have reviewed the plan of care with the patient, accepted their input and they are in agreement with the treatment goals.          More than 1/2 of this 30 minute visit was spent in counseling and coordination of care, as described above.    Wyline Beady, FNP-C  [facility]  [address] [place] [address]  Phone: [phone]  Fax: [phone]    ---------------------------------------------------------------------------------------------------------------------------  This is a subsequent Preventive Physical Examination (IPPE) providing  Personalized Prevention Plan Services     [place] is a 77 y.o. Caucasian female and presents for a welcome to Medicare physical exam         I have reviewed the patient's medical history in detail and updated the computerized patient record.     History     Past Medical History:   Diagnosis Date   ??? Allergic rhinitis    ??? Arthritis    ??? Cancer ([place])     breast   ??? Colon polyps    ??? Depression    ??? Diverticulosis    ??? GERD (gastroesophageal reflux disease)    ??? Hepatomegaly    ??? IBS (irritable bowel syndrome)       Past Surgical History:   Procedure Laterality Date   ??? ENDOSCOPY, COLON, DIAGNOSTIC     ??? HX HYSTERECTOMY     ??? HX MASTECTOMY      modified right   ??? HX TONSILLECTOMY     ??? HX WISDOM TEETH EXTRACTION       Current Outpatient Medications   Medication Sig Dispense Refill   ??? multivit-min/iron/folic/lutein (CENTRUM SILVER WOMEN PO) Take  by mouth.     ??? varicella-zoster recombinant, PF, (SHINGRIX, PF,) 50 mcg/0.5 mL susr injection First injection IM then second in 2 to 6 months after first 0.5 mL 1   ??? calcium citrate-vitamin D3 (CITRACAL + D) tablet Take 1 Tab by mouth two (2) times a day.       Allergies   Allergen Reactions   ??? Augmentin [Amoxicillin-Pot Clavulanate] Rash   ??? Clavulanic Acid Hives   ??? Tamoxifen Nausea Only     Family History   Problem Relation Age of Onset   ??? Cancer Father         lung   ??? Other Mother  hyperthyroidism   ??? Coronary Artery Disease Mother    ??? Hypertension Mother    ??? Elevated Lipids Brother    ??? Cancer Brother         colon cancer   ??? Hypertension Brother      Social History     Tobacco Use   ??? Smoking status: Never Smoker   ??? Smokeless tobacco: Never Used   Substance Use Topics   ??? 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   Mammo- ordered   Dexascan- ordered    Depression Risk Factor Screening:      Patient Health Questionnaire (PHQ-2)   Over the last 2 weeks, how often have you been bothered by any of the following problems?  ?? Little interest or pleasure in doing things?  ?? Several days. [1]  ?? Feeling down, depressed, or hopeless?   ?? Several days. [1]    Total Score: 2/6  PHQ-2 Assessment Scoring:   A score of 2 or more requires further screening with the PHQ-9    Alcohol Risk Factor Screening:     Women: On any occasion during the past 3 months, have you had more than 3 drinks containing alcohol? NO   Do you average more than 7 drinks per week? NO      Functional Ability and Level of Safety:     Hearing Loss    Hearing is good.    Activities of Daily Living   Self-care.   Requires assistance with: no ADLs    Fall Risk   No fall risk factors    Abuse Screen   None    Review of Systems   ROS   History obtained from the patient    All other systems reviewed and are negative.    A comprehensive review of systems was negative except for that written in the HPI.        Examination   Physical Examination  Vitals:    09/01/17 0937   BP: 132/78   Pulse: 74   Resp: 14   SpO2: 97%   Weight: 144 lb (65.3 kg)   Height: 5' 6"  (1.676 m)   PainSc:   0 - No pain      Body mass index is 23.24 kg/m??.  Visual Acuity: Not performed  EKG Screening: normal EKG, normal sinus rhythm, unchanged from previous tracings, n/a.     End-of-life planning  Advanced Directive in the case than an injury or illness causes the patient to be unable to make health care decisions  Health Care Directive or Living Will: no    Assessment/Plan:   Education and counseling provided:  Are appropriate based on today's review and evaluation  lab results and schedule of future lab studies reviewed with patient  reviewed diet, exercise and weight control.  Brief written plan, checklist  Handouts for vaccines provided.      I have discussed the diagnosis with the patient and the intended plan as seen in the above orders.  The patient has received an after-visit summary and questions were answered concerning future plans.  I have discussed medication side effects and warnings with  the patient as well.I have reviewed the plan of care with the patient, accepted their input and they are in agreement with the treatment goals.

## 2017-09-01 NOTE — Patient Instructions (Signed)
??  Pneumococcal Conjugate Vaccine (PCV13): What You Need to Know  Why get vaccinated?  Vaccination can protect both children and adults from pneumococcal disease.  Pneumococcal disease is caused by bacteria that can spread from person to person through close contact. It can cause ear infections, and it can also lead to more serious infections of the:  ?? Lungs (pneumonia).  ?? Blood (bacteremia).  ?? Covering of the brain and spinal cord (meningitis).  Pneumococcal pneumonia is most common among adults. Pneumococcal meningitis can cause deafness and brain damage, and it kills about 1 child in 10 who get it.  Anyone can get pneumococcal disease, but children under 67 years of age and adults 19 years and older, people with certain medical conditions, and cigarette smokers are at the highest risk.  Before there was a vaccine, the [place] saw the following in children under 5 each year from pneumococcal disease:  ?? More than 700 cases of meningitis  ?? About 13,000 blood infections  ?? About 5 million ear infections  ?? About 200 deaths  Since the vaccine became available, severe pneumococcal disease in these children has fallen by 88%.  About 18,000 older adults die of pneumococcal disease each year in the [place].  Treatment of pneumococcal infections with penicillin and other drugs is not as effective as it used to be, because some strains of the disease have become resistant to these drugs. This makes prevention of the disease through vaccination even more important.  PCV13 vaccine  Pneumococcal conjugate vaccine (called PCV13) protects against 13 types of pneumococcal bacteria.  PCV13 is routinely given to children at 2, 4, 6, and 12???71 months of age. It is also recommended for children and adults 8 to 14 years of age with certain health conditions, and for all adults 79 years of age and older. Your doctor can give you details.  Some people should not get this vaccine   Anyone who has ever had a life-threatening allergic reaction to a dose of this vaccine, to an earlier pneumococcal vaccine called PCV7, or to any vaccine containing diphtheria toxoid (for example, DTaP), should not get PCV13.  Anyone with a severe allergy to any component of PCV13 should not get the vaccine. Tell your doctor if the person being vaccinated has any severe allergies.  If the person scheduled for vaccination is not feeling well, your healthcare provider might decide to reschedule the shot on another day.  Risks of a vaccine reaction  With any medicine, including vaccines, there is a chance of reactions. These are usually mild and go away on their own, but serious reactions are also possible. Problems reported following PCV13 varied by age and dose in the series.  The most common problems reported among children were:  ?? About half became drowsy after the shot, had a temporary loss of appetite, or had redness or tenderness where the shot was given.  ?? About 1 out of 3 had swelling where the shot was given.  ?? About 1 out of 3 had a mild fever, and about 1 in 20 had a fever over 102.2??F.  ?? Up to about 8 out of 10 became fussy or irritable.  Adults have reported pain, redness, and swelling where the shot was given; also mild fever, fatigue, headache, chills, or muscle pain.  Young children who get PCV13 along with inactivated flu vaccine at the same time may be at increased risk for seizures caused by fever. Ask your doctor for more information.  Problems that could happen after any vaccine:  ?? People sometimes faint after a medical procedure, including vaccination. Sitting or lying down for about 15 minutes can help prevent fainting and the injuries caused by a fall. Tell your doctor if you feel dizzy or have vision changes or ringing in the ears.  ?? Some older children and adults get severe pain in the shoulder and have difficulty moving the arm where a shot was given. This happens very rarely.   ?? Any medication can cause a severe allergic reaction. Such reactions from a vaccine are very rare, estimated at about 1 in a million doses, and would happen within a few minutes to a few hours after the vaccination.  As with any medicine, there is a very small chance of a vaccine causing a serious injury or death.  The safety of vaccines is always being monitored. For more information, visit: [email or web address].  What if there is a serious reaction?  What should I look for?  ?? Look for anything that concerns you, such as signs of a severe allergic reaction, very high fever, or unusual behavior.  Signs of a severe allergic reaction can include hives, swelling of the face and throat, difficulty breathing, a fast heartbeat, dizziness, and weakness, usually within a few minutes to a few hours after the vaccination.  What should I do?  ?? If you think it is a severe allergic reaction or other emergency that can't wait, call 911 or get the person to the nearest hospital. Otherwise, call your doctor.  ?? Reactions should be reported to the Vaccine Adverse Event Reporting System (VAERS). Your doctor should file this report, or you can do it yourself through the VAERS website at www.vaers.SamedayNews.es, or by calling 838-560-4609.  VAERS does not give medical advice.  The National Vaccine Injury Compensation Program  The National Vaccine Injury Compensation Program (VICP) is a federal program that was created to compensate people who may have been injured by certain vaccines.  Persons who believe they may have been injured by a vaccine can learn about the program and about filing a claim by calling 872-443-7867 or visiting the [place] website at GoldCloset.com.ee. There is a time limit to file a claim for compensation.  How can I learn more?  ?? Ask your healthcare provider. He or she can give you the vaccine package insert or suggest other sources of information.   ?? Call your local or state health department.  ?? Contact the Centers for Disease Control and Prevention (CDC):  ? Call 854-657-1158 (1-800-CDC-INFO) or  ? Visit CDC's website at http://hunter.com/  Vaccine Information Statement  PCV13 Vaccine  03/01/2014  42 [place] ?? [phone]  Department of Health and [employer] for Disease Control and Prevention  Many Vaccine Information Statements are available in Spanish and other languages. See AbsolutelyGenuine.com.br.  Muchas hojas de informaci??n sobre vacunas est??n disponibles en espa??ol y en otros idiomas. Visite AbsolutelyGenuine.com.br.  Care instructions adapted under license by [employer] (which disclaims liability or warranty for this information). If you have questions about a medical condition or this instruction, always ask your healthcare professional. [place] any warranty or liability for your use of this information.  ??  ??      ??  Recombinant Zoster (Shingles) Vaccine, RZV: What you need to know  Why get vaccinated?  Shingles (also called herpes zoster, or just zoster) is a painful skin rash, often with blisters. Shingles is caused by the varicella zoster  virus, the same virus that causes chickenpox. After you have chickenpox, the virus stays in your body and can cause shingles later in life.  You can't catch shingles from another person. However, a person who has never had chickenpox (or chickenpox vaccine) could get chickenpox from someone with shingles.  A shingles rash usually appears on one side of the face or body and heals within 2 to 4 weeks. Its main symptom is pain, which can be severe. Other symptoms can include fever, headache, chills, and upset stomach. Very rarely, a shingles infection can lead to pneumonia, hearing problems, blindness, brain inflammation (encephalitis), or death.  For about 1 person in 5, severe pain can continue even long after the rash  has cleared up. This long-lasting pain is called post-herpetic neuralgia (PHN).  Shingles is far more common in people 59 years of age and older than in younger people, and the risk increases with age. It is also more common in people whose immune system is weakened because of a disease such as cancer, or by drugs such as steroids or chemotherapy. At least 1 million people a year in the [place] get shingles.  Shingles vaccine (recombinant)  Recombinant shingles vaccine was approved by FDA in 2017 for the prevention of shingles. In clinical trials, it was more than 90% effective in preventing shingles. It can also reduce the likelihood of PHN.  Two doses, 2 to 6 months apart, are recommended for adults 42 and older.  This vaccine is also recommended for people who have already gotten the live shingles vaccine (Zostavax). There is no live virus in this vaccine.  Some people should not get this vaccine  Tell your vaccine provider if you:  ?? Have any severe, life-threatening allergies. A person who has ever had a life-threatening allergic reaction after a dose of recombinant shingles vaccine, or has a severe allergy to any component of this vaccine, may be advised not to be vaccinated. Ask your health care provider if you want information about vaccine components.  ?? Are pregnant or breastfeeding. There is not much information about use of recombinant shingles vaccine in pregnant or nursing women. Your healthcare provider might recommend delaying vaccination.  ?? Are not feeling well. If you have a mild illness, such as a cold, you can probably get the vaccine today. If you are moderately or severely ill, you should probably wait until you recover. Your doctor can advise you.  Risks of a vaccine reaction  With any medicine, including vaccines, there is a chance of reactions.  After recombinant shingles vaccination, a person might experience:  ?? Pain, redness, soreness, or swelling at the site of the injection   ?? Headache, muscle aches, fever, shivering, fatigue  In clinical trials, most people got a sore arm with mild or moderate pain after vaccination, and some also had redness and swelling where they got the shot. Some people felt tired, had muscle pain, a headache, shivering, fever, stomach pain, or nausea. About 1 out of 6 people who got recombinant zoster vaccine experienced side effects that prevented them from doing regular activities. Symptoms went away on their own in about 2 to 3 days. Side effects were more common in younger people.  You should still get the second dose of recombinant zoster vaccine even if you had one of these reactions after the first dose.  Other things that could happen after this vaccine:  ?? People sometimes faint after medical procedures, including vaccination. Sitting or lying down for  about 15 minutes can help prevent fainting and injuries caused by a fall. Tell your provider if you feel dizzy or have vision changes or ringing in the ears.  ?? Some people get shoulder pain that can be more severe and longer-lasting than routine soreness that can follow injections. This happens very rarely.  ?? Any medication can cause a severe allergic reaction. Such reactions to a vaccine are estimated at about 1 in a million doses, and would happen within a few minutes to a few hours after the vaccination.  As with any medicine, there is a very remote chance of a vaccine causing a serious injury or death.  The safety of vaccines is always being monitored. For more information, visit: http://www.aguilar.org/  What if there is a serious problem?  What should I look for?  ?? Look for anything that concerns you, such as signs of a severe allergic reaction, very high fever, or unusual behavior.  Signs of a severe allergic reaction can include hives, swelling of the face and throat, difficulty breathing, a fast heartbeat, dizziness, and  weakness. These would usually start a few minutes to a few hours after the vaccination.  What should I do?  ?? If you think it is a severe allergic reaction or other emergency that can't wait, call 9-1-1 or get to the nearest hospital. Otherwise, call your health care provider.  ?? Afterward, the reaction should be reported to the Vaccine Adverse Event Reporting System (VAERS). Your doctor should file this report, or you can do it yourself through the VAERS website at www.vaers.SamedayNews.es, or by calling [phone].  VAERS does not give medical advice..  How can I learn more?  ?? Ask your health care provider. He or she can give you the vaccine package insert or suggest other sources of information.  ?? Call your local or state health department.  ?? Contact the Centers for Disease Control and Prevention (CDC):  ? Call 405-686-8331 (1-800-CDC-INFO) or  ? Visit CDC's website at http://hunter.com/  Vaccine Information Statement (Interim)  Recombinant Zoster Vaccine  06/08/2016  Department of Health and [employer] for Disease Control and Prevention  Many Vaccine Information Statements are available in Spanish and other languages. See AbsolutelyGenuine.com.br.  Hojas de Informaci??n Sobre Vacunas est??n disponibles en Espa??ol y en muchos otros idiomas. Visite https://www.martin.org/   Care instructions adapted under license by [employer] (which disclaims liability or warranty for this information). If you have questions about a medical condition or this instruction, always ask your healthcare professional. [place] any warranty or liability for your use of this information.  ??  ??        ??  Tdap (Tetanus, Diphtheria, Pertussis) Vaccine: What You Need to Know  Why get vaccinated?  Tetanus, diphtheria, and pertussis are very serious diseases. Tdap vaccine can protect [place] from these diseases. And Tdap vaccine given to pregnant women can protect newborn babies against pertussis.   Tetanus (lockjaw) is rare in the [place] today. It causes painful muscle tightening and stiffness, usually all over the body.  ?? It can lead to tightening of muscles in the head and neck so you can't open your mouth, swallow, or sometimes even breathe. Tetanus kills about 1 out of 10 people who are infected even after receiving the best medical care.  Diphtheria is also rare in the [place] today. It can cause a thick coating to form in the back of the throat.  ?? It can lead  to breathing problems, heart failure, paralysis, and death.  Pertussis (whooping cough) causes severe coughing spells, which can cause difficulty breathing, vomiting, and disturbed sleep.  ?? It can also lead to weight loss, incontinence, and rib fractures. Up to 2 in 100 adolescents and 5 in 100 adults with pertussis are hospitalized or have complications, which could include pneumonia or death.  These diseases are caused by bacteria. Diphtheria and pertussis are spread from person to person through secretions from coughing or sneezing. Tetanus enters the body through cuts, scratches, or wounds.  Before vaccines, as many as 200,000 cases of diphtheria, 200,000 cases of pertussis, and hundreds of cases of tetanus were reported in the [place] each year. Since vaccination began, reports of cases for tetanus and diphtheria have dropped by about 99% and for pertussis by about 80%.  Tdap vaccine  The Tdap vaccine can protect adolescents and adults from tetanus, diphtheria, and pertussis. One dose of Tdap is routinely given at age 60 or 18. People who did not get Tdap at that age should get it as soon as possible.  Tdap is especially important for health care professionals and anyone having close contact with a baby younger than 12 months.  Pregnant women should get a dose of Tdap during every pregnancy, to protect the newborn from pertussis. Infants are most at risk for severe, life-threatening complications from pertussis.   Another vaccine, called Td, protects against tetanus and diphtheria, but not pertussis. A Td booster should be given every 10 years. Tdap may be given as one of these boosters if you have never gotten Tdap before. Tdap may also be given after a severe cut or burn to prevent tetanus infection.  Your doctor or the person giving you the vaccine can give you more information.  Tdap may safely be given at the same time as other vaccines.  Some people should not get this vaccine  ?? A person who has ever had a life-threatening allergic reaction after a previous dose of any diphtheria-, tetanus-, or pertussis-containing vaccine, OR has a severe allergy to any part of this vaccine, should not get Tdap vaccine. Tell the person giving the vaccine about any severe allergies.  ?? Anyone who had coma or long repeated seizures within 7 days after a childhood dose of DTP or DTaP, or a previous dose of Tdap, should not get Tdap, unless a cause other than the vaccine was found. They can still get Td.  ?? Talk to your doctor if you:  ? Have seizures or another nervous system problem.  ? Had severe pain or swelling after any vaccine containing diphtheria, tetanus, or pertussis.  ? Ever had a condition called Guillain-Barr?? Syndrome (GBS).  ? Aren't feeling well on the day the shot is scheduled.  Risks  With any medicine, including vaccines, there is a chance of side effects. These are usually mild and go away on their own. Serious reactions are also possible but are rare.  Most people who get Tdap vaccine do not have any problems with it.  Mild problems following Tdap  (Did not interfere with activities)  ?? Pain where the shot was given (about 3 in 4 adolescents or 2 in 3 adults)  ?? Redness or swelling where the shot was given (about 1 person in 5)  ?? Mild fever of at least 100.4??F (up to about 1 in 25 adolescents or 1 in 100 adults)  ?? Headache (about 3 or 4 people in 10)  ?? Tiredness (about  1 person in 3 or 4)   ?? Nausea, vomiting, diarrhea, stomachache (up to 1 in 4 adolescents or 1 in 10 adults)  ?? Chills, sore joints (about 1 person in 10)  ?? Body aches (about 1 person in 3 or 4)  ?? Rash, swollen glands (uncommon)  Moderate problems following Tdap  (Interfered with activities, but did not require medical attention)  ?? Pain where the shot was given (up to 1 in 5 or 6)  ?? Redness or swelling where the shot was given (up to about 1 in 16 adolescents or 1 in 12 adults)  ?? Fever over 102??F (about 1 in 100 adolescents or 1 in 250 adults)  ?? Headache (about 1 in 7 adolescents or 1 in 10 adults)  ?? Nausea, vomiting, diarrhea, stomachache (up to 1 to 3 people in 100)  ?? Swelling of the entire arm where the shot was given (up to about 1 in 500)  Severe problems following Tdap  (Unable to perform usual activities; required medical attention)  ?? Swelling, severe pain, bleeding and redness in the arm where the shot was given (rare)  Problems that could happen after any vaccine:  ?? People sometimes faint after a medical procedure, including vaccination. Sitting or lying down for about 15 minutes can help prevent fainting, and injuries caused by a fall. Tell your doctor if you feel dizzy or have vision changes or ringing in the ears.  ?? Some people get severe pain in the shoulder and have difficulty moving the arm where a shot was given. This happens very rarely.  ?? Any medication can cause a severe allergic reaction. Such reactions from a vaccine are very rare, estimated at fewer than 1 in a million doses, and would happen within a few minutes to a few hours after the vaccination.  As with any medicine, there is a very remote chance of a vaccine causing a serious injury or death.  The safety of vaccines is always being monitored. For more information, visit: [email or web address]ch.  What if there is a serious problem?  What should I look for?  ?? Look for anything that concerns you, such as signs of a severe allergic  reaction, very high fever, or unusual behavior.  Signs of a severe allergic reaction can include hives, swelling of the face and throat, difficulty breathing, a fast heartbeat, dizziness, and weakness. These would usually start a few minutes to a few hours after the vaccination.  What should I do?  ?? If you think it is a severe allergic reaction or other emergency that can't wait, call 9-1-1 or get the person to the nearest hospital. Otherwise, call your doctor.  ?? Afterward, the reaction should be reported to the Vaccine Adverse Event Reporting System (VAERS). Your doctor might file this report, or you can do it yourself through the VAERS web site at www.vaers.SamedayNews.es, or by calling 4320861180.  VAERS does not give medical advice.  The National Vaccine Injury Compensation Program  The National Vaccine Injury Compensation Program (VICP) is a federal program that was created to compensate people who may have been injured by certain vaccines.  Persons who believe they may have been injured by a vaccine can learn about the program and about filing a claim by calling 905-580-3606 or visiting the [place] website at GoldCloset.com.ee. There is a time limit to file a claim for compensation.  How can I learn more?  ?? Ask your doctor. He or she can give you the vaccine package  insert or suggest other sources of information.  ?? Call your local or state health department.  ?? Contact the Centers for Disease Control and Prevention (CDC):  ? Call 631-348-4391 (1-800-CDC-INFO) or  ? Visit CDC's website at http://hunter.com/  Vaccine Information Statement (Interim)  Tdap Vaccine  (06/20/13)  42 [place] ?? [phone]  Department of Health and [place] for Disease Control and Prevention  Many Vaccine Information Statements are available in Spanish and other languages. See AbsolutelyGenuine.com.br.  Muchas hojas de informaci??n sobre vacunas est??n disponibles en espa??ol y  en otros idiomas. Visite AbsolutelyGenuine.com.br.  Care instructions adapted under license by [employer] (which disclaims liability or warranty for this information). If you have questions about a medical condition or this instruction, always ask your healthcare professional. [place] any warranty or liability for your use of this information.  ??  ??

## 2017-09-08 ENCOUNTER — Other Ambulatory Visit: Admit: 2017-09-08 | Discharge: 2017-09-08 | Payer: MEDICARE | Primary: Family Medicine

## 2017-09-08 ENCOUNTER — Inpatient Hospital Stay: Admit: 2017-09-08 | Payer: MEDICARE | Primary: Family Medicine

## 2017-09-08 DIAGNOSIS — E559 Vitamin D deficiency, unspecified: Secondary | ICD-10-CM

## 2017-09-08 DIAGNOSIS — R5382 Chronic fatigue, unspecified: Secondary | ICD-10-CM

## 2017-09-08 NOTE — Progress Notes (Signed)
Vitamin d is insufficient- recommend OTC vitamin D 2000 units daily if not already, labs show need to improve hydration- increase water, cholesterol has improved slightly compared to 9 months ago and recommendation is significant diet changes stopping sweets, breads, pastas, and increasing vegetables and fruits, the calculated risk suggests need for Statin Cholesterol medication, thyroid is good, blood levels- wbc and rbc good,

## 2017-09-09 LAB — METABOLIC PANEL, COMPREHENSIVE
A-G Ratio: 1.3 (ref 0.8–1.7)
ALT (SGPT): 18 U/L (ref 13–56)
AST (SGOT): 17 U/L (ref 15–37)
Albumin: 3.9 g/dL (ref 3.4–5.0)
Alk. phosphatase: 64 U/L (ref 45–117)
Anion gap: 4 mmol/L (ref 3.0–18)
BUN/Creatinine ratio: 23 — ABNORMAL HIGH (ref 12–20)
BUN: 20 MG/DL — ABNORMAL HIGH (ref 7.0–18)
Bilirubin, total: 0.8 MG/DL (ref 0.2–1.0)
CO2: 30 mmol/L (ref 21–32)
Calcium: 9.2 MG/DL (ref 8.5–10.1)
Chloride: 107 mmol/L (ref 100–108)
Creatinine: 0.86 MG/DL (ref 0.6–1.3)
GFR est AA: >60 mL/min/{1.73_m2} (ref >60)
GFR est non-AA: >60 mL/min/{1.73_m2} (ref >60)
Globulin: 2.9 g/dL (ref 2.0–4.0)
Glucose: 98 mg/dL (ref 74–99)
Potassium: 4.3 mmol/L (ref 3.5–5.5)
Protein, total: 6.8 g/dL (ref 6.4–8.2)
Sodium: 141 mmol/L (ref 136–145)

## 2017-09-09 LAB — CBC WITH AUTOMATED DIFF
ABS. BASOPHILS: 0 10*3/uL (ref 0.0–0.1)
ABS. EOSINOPHILS: 0.1 10*3/uL (ref 0.0–0.4)
ABS. LYMPHOCYTES: 1.4 10*3/uL (ref 0.9–3.6)
ABS. MONOCYTES: 0.5 10*3/uL (ref 0.05–1.2)
ABS. NEUTROPHILS: 4 10*3/uL (ref 1.8–8.0)
BASOPHILS: 1 % (ref 0–2)
EOSINOPHILS: 2 % (ref 0–5)
HCT: 47.1 % — ABNORMAL HIGH (ref 35.0–45.0)
HGB: 15 g/dL (ref 12.0–16.0)
LYMPHOCYTES: 23 % (ref 21–52)
MCH: 30.1 PG (ref 24.0–34.0)
MCHC: 31.8 g/dL (ref 31.0–37.0)
MCV: 94.4 FL (ref 74.0–97.0)
MONOCYTES: 8 % (ref 3–10)
MPV: 10.5 FL (ref 9.2–11.8)
NEUTROPHILS: 66 % (ref 40–73)
PLATELET: 267 10*3/uL (ref 135–420)
RBC: 4.99 M/uL (ref 4.20–5.30)
RDW: 14.3 % (ref 11.6–14.5)
WBC: 6 10*3/uL (ref 4.6–13.2)

## 2017-09-09 LAB — LIPID PANEL
CHOL/HDL Ratio: 3.2 (ref 0–5.0)
Cholesterol, total: 217 MG/DL — ABNORMAL HIGH (ref <200)
HDL Cholesterol: 67 MG/DL — ABNORMAL HIGH (ref 40–60)
LDL, calculated: 129 MG/DL — ABNORMAL HIGH (ref 0–100)
Triglyceride: 105 MG/DL (ref <150)
VLDL, calculated: 21 MG/DL

## 2017-09-09 LAB — VITAMIN D, 25 HYDROXY: Vitamin D 25-Hydroxy: 25.9 ng/mL — ABNORMAL LOW (ref 30–100)

## 2017-09-09 LAB — TSH 3RD GENERATION: TSH: 2.13 u[IU]/mL (ref 0.36–3.74)

## 2017-09-15 NOTE — Telephone Encounter (Signed)
-----   Message from Lieutenant Diego, NP sent at 09/14/2017  9:32 PM EDT -----  Vitamin d is insufficient- recommend OTC vitamin D 2000 units daily if not already, labs show need to improve hydration- increase water, cholesterol has improved slightly compared to 9 months ago and recommendation is significant diet changes stopping sweets, breads, pastas, and increasing vegetables and fruits, the calculated risk suggests need for Statin Cholesterol medication, thyroid is good, blood levels- wbc and rbc good,

## 2017-09-15 NOTE — Telephone Encounter (Signed)
Called and spoke with patient gave message below. Patient stated she would need an RX for the vitamin D. I explained that this is the OTC dosage but patient stated she can not swallow pills and would need this in a liquid or crushable form and stated she has not been able to find that OTC. Also wanted to know if you are going to send in an RX for the statin? Again she stated this would need to be crushable or liquid. Please send any RX's to the [facility] on [address]

## 2017-09-21 MED ORDER — ATORVASTATIN 10 MG TAB
10 mg | ORAL_TABLET | Freq: Every day | ORAL | 1 refills | Status: DC
Start: 2017-09-21 — End: 2018-05-06

## 2017-09-21 NOTE — Addendum Note (Signed)
Addended by: Lieutenant Diego on: 09/21/2017 01:29 PM     Modules accepted: Orders

## 2017-09-21 NOTE — Telephone Encounter (Signed)
Yes please send to [facility]

## 2017-09-21 NOTE — Telephone Encounter (Signed)
Statins can be crushed and she can purchase liquid Vitamin D. Would she like me to call in the statin therapy.

## 2017-09-21 NOTE — Addendum Note (Signed)
Addendum Note by Lieutenant Diego, NP at 09/21/17 1329                Author: Lieutenant Diego, NP  Service: --  Author Type: Nurse Practitioner       Filed: 09/21/17 1329  Encounter Date: 09/15/2017  Status: Signed          Editor: Harlon Kutner, Adele Barthel, NP (Nurse Practitioner)          Addended by: Lieutenant Diego on: 09/21/2017 01:29 PM    Modules accepted: Orders

## 2017-10-04 NOTE — Telephone Encounter (Signed)
Pt called and asked for her Lab Results from 05/15 to be mailed to her

## 2017-10-04 NOTE — Telephone Encounter (Signed)
Lab results have been mailed

## 2017-12-15 ENCOUNTER — Other Ambulatory Visit: Payer: Self-pay | Admitting: Family Medicine

## 2017-12-15 DIAGNOSIS — Z1239 Encounter for other screening for malignant neoplasm of breast: Secondary | ICD-10-CM

## 2018-01-11 ENCOUNTER — Ambulatory Visit: Payer: Medicare Other | Admitting: Family Medicine

## 2018-01-12 ENCOUNTER — Ambulatory Visit: Payer: Medicare Other

## 2018-01-12 ENCOUNTER — Ambulatory Visit: Payer: Medicare Other | Admitting: Family Medicine

## 2018-01-13 ENCOUNTER — Ambulatory Visit (INDEPENDENT_AMBULATORY_CARE_PROVIDER_SITE_OTHER): Payer: Medicare Other

## 2018-01-13 DIAGNOSIS — Z1239 Encounter for other screening for malignant neoplasm of breast: Secondary | ICD-10-CM

## 2018-01-13 DIAGNOSIS — Z1231 Encounter for screening mammogram for malignant neoplasm of breast: Secondary | ICD-10-CM

## 2018-02-09 ENCOUNTER — Ambulatory Visit (INDEPENDENT_AMBULATORY_CARE_PROVIDER_SITE_OTHER): Payer: Medicare Other | Admitting: Family Medicine

## 2018-02-09 ENCOUNTER — Encounter: Payer: Self-pay | Admitting: Family Medicine

## 2018-02-09 VITALS — BP 120/63 | HR 75 | Ht 65.98 in | Wt 148.0 lb

## 2018-02-09 DIAGNOSIS — R32 Unspecified urinary incontinence: Secondary | ICD-10-CM | POA: Diagnosis not present

## 2018-02-09 DIAGNOSIS — Z853 Personal history of malignant neoplasm of breast: Secondary | ICD-10-CM | POA: Diagnosis not present

## 2018-02-09 DIAGNOSIS — E559 Vitamin D deficiency, unspecified: Secondary | ICD-10-CM

## 2018-02-09 DIAGNOSIS — F411 Generalized anxiety disorder: Secondary | ICD-10-CM

## 2018-02-09 DIAGNOSIS — N3281 Overactive bladder: Secondary | ICD-10-CM

## 2018-02-09 DIAGNOSIS — B372 Candidiasis of skin and nail: Secondary | ICD-10-CM

## 2018-02-09 MED ORDER — NYSTATIN 100000 UNIT/GM EX POWD: Freq: Four times a day (QID) | CUTANEOUS | 2 refills | Status: DC

## 2018-02-09 NOTE — Patient Instructions (Signed)
I would recommend add 1000 IU/39mcg of daily Vitamin D to your regimen.  We can recheck your Vitamin D in the spring.

## 2018-02-09 NOTE — Progress Notes (Signed)
Subjective:    Patient ID: Jodi Kaufman, female    DOB: Oct 04, 1940, 77 y.o.   MRN: [id number]  HPI  She has a history of Stage 11B infiltrating ductal carcinoma.  She has been she actually saw a Designer, jewellery in [place] where she lives back in May.  They ordered a CBC, CMP, TSH, vitamin D, urinalysis and lipid panel at that time.  Losing weight and she feels fatigue. She feels like she is shaking at times.  Kidney function and liver enzymes were normal.  Cholesterol was mildly elevated.  Myoglobin was normal.  No anemia.  No sign of infection.  Her thyroid and glucose levels were normal.  Vitamin D was low.  Analysis was normal as well.  Dr. Ozella Almond as has been her oncologist for years that is been several years since she seen him.  Even though she has been cancer free for 10 years she wants to get back in and see him at least once a year and would like [place] to place a new referral.\She did just have her mammogram done last month.  She is also concerned because at one point in time she was told she had some type of lump or mass possibly on the left ovary she says they followed it with 2 pelvic ultrasounds and then has not had it followed up since.  He is also having incontinence issues.  She says she is worn pads in her underwear for years but just feels like now when her bladder leaks the entire bladder empties it is not just a little bit of incontinence.  Is to the point where she is having to wear adult diapers and she is interested in having that addressed more in-depth.  Because of the incontinence issues she gets a lot of difficulty with getting yeast on her skin she says she tries to dry her skin as soon as she can and tries to clean with baby wipes but sometimes is not able to do that immediately.  She does have vitamin D deficiency and says she has been taking a supplement.  Her level in May was low.  He also reports a history of constipation she will sometimes use milk of  magnesia or MiraLAX as needed.  She does have a family history of colon cancer and is overdue for her colonoscopy.  Review of Systems  Constitutional: Positive for fatigue and unexpected weight change. Negative for diaphoresis and fever.  HENT: Negative for hearing loss, postnasal drip, sneezing and tinnitus.   Eyes: Negative for visual disturbance.  Respiratory: Negative for cough and wheezing.   Cardiovascular: Negative for chest pain and palpitations.  Gastrointestinal: Positive for constipation.  Genitourinary: Negative for vaginal bleeding and vaginal discharge.  Musculoskeletal: Positive for arthralgias, back pain and myalgias.  Neurological: Negative for headaches.  Hematological: Negative for adenopathy. Does not bruise/bleed easily.   BP 120/63   Pulse 75   Ht 5' 5.98" (1.676 m)   Wt 148 lb (67.1 kg)   SpO2 100%   BMI 23.90 kg/m     Allergies  Allergen Reactions  . Clavulanic Acid Hives  . Tamoxifen Nausea Only  . Amoxicillin-Pot Clavulanate Rash    Hives    Past Medical History:  Diagnosis Date  . Breast cancer [facility])     Past Surgical History:  Procedure Laterality Date  . BREAST BIOPSY    . MASTECTOMY    . MASTECTOMY W/ SENTINEL NODE BIOPSY     Right. 18 years  ago.      Social History   Socioeconomic History  . Marital status: Single    Spouse name: Not on file  . Number of children: Not on file  . Years of education: Not on file  . Highest education level: Not on file  Occupational History  . Not on file  Social Needs  . Financial resource strain: Not on file  . Food insecurity:    Worry: Not on file    Inability: Not on file  . Transportation needs:    Medical: Not on file    Non-medical: Not on file  Tobacco Use  . Smoking status: Never Smoker  . Smokeless tobacco: Never Used  Substance and Sexual Activity  . Alcohol use: Never    Frequency: Never    Comment: Rarley.    . Drug use: Never  . Sexual activity: Not Currently  Lifestyle   . Physical activity:    Days per week: Not on file    Minutes per session: Not on file  . Stress: Not on file  Relationships  . Social connections:    Talks on phone: Not on file    Gets together: Not on file    Attends religious service: Not on file    Active member of club or organization: Not on file    Attends meetings of clubs or organizations: Not on file    Relationship status: Not on file  . Intimate partner violence:    Fear of current or ex partner: Not on file    Emotionally abused: Not on file    Physically abused: Not on file    Forced sexual activity: Not on file  Other Topics Concern  . Not on file  Social History Narrative   Lives in [place] but stays with her daughter 1-2 weeks out of the month.      Family History  Problem Relation Age of Onset  . Heart disease Mother   . Anemia Mother        penicous anemia.   . Alzheimer's disease Mother     Outpatient Encounter Medications as of 02/09/2018  Medication Sig  . Calcium Citrate (CITRACAL PO) Take by mouth.  . Multiple Vitamins-Minerals (CENTRUM SILVER 50+WOMEN PO) Take 1 tablet by mouth daily.  . [DISCONTINUED] atorvastatin (LIPITOR) 10 MG tablet Take 10 mg by mouth at bedtime.  . [DISCONTINUED] Calcium Citrate-Vitamin D 315-250 MG-UNIT TABS Take 1 tablet by mouth 2 (two) times daily.  Marland Kitchen nystatin (MYCOSTATIN/NYSTOP) powder Apply topically 4 (four) times daily.  . [DISCONTINUED] Multiple Vitamins-Minerals (CENTRUM SILVER PO) Take by mouth.   No facility-administered encounter medications on file as of 02/09/2018.          Objective:   Physical Exam  Constitutional: She is oriented to person, place, and time. She appears well-developed and well-nourished.  HENT:  Head: Normocephalic and atraumatic.  Cardiovascular: Normal rate, regular rhythm and normal heart sounds.  Pulmonary/Chest: Effort normal and breath sounds normal.  Neurological: She is alert and oriented to person, place, and time.  Skin:  Skin is warm and dry.  Psychiatric: She has a normal mood and affect. Her behavior is normal.       Assessment & Plan:  Fatigue/weight loss-she is most concerned about getting back in with Dr. Nadyne Coombes and we will work on that.  Encouraged her to continue to keep an eye on her weight.  She said she did get a little bit of weight back after she went  to the beach this summer.  History of breast cancer-mammogram up-to-date.  Vitamin D deficiency-continue with calcium supplement but did recommend that she consider adding a separate vitamin D, 1000 IU / 25 mg daily.  Incontinence-would be happy to refer her to urology for further evaluation and to discuss treatment options.  Unclear if she could have overactive bladder, detrusor instability, bladder prolapse etc.  Recurrent skin yeast infections of the groin/perineum-we will send over prescription for nystatin powder for her to use as needed.  Discussed the most important thing is to get the skin is dry as quickly as possible after the incontinence episodes.  Family history colon cancer-reminded her that she is overdue for colonoscopy and encouraged her to schedule.

## 2018-03-18 ENCOUNTER — Other Ambulatory Visit: Payer: Self-pay | Admitting: Family Medicine

## 2018-03-18 MED ORDER — NYSTATIN 100000 UNIT/GM EX CREA: 1.0000 "application " | TOPICAL_CREAM | Freq: Two times a day (BID) | CUTANEOUS | 0 refills | Status: DC

## 2018-03-18 NOTE — Telephone Encounter (Signed)
Ok to make change 

## 2018-03-18 NOTE — Telephone Encounter (Signed)
Pt would like the Rx for Nystatin changed to cream/ointment. Same pharmacy [facility], [place]). Routing.

## 2018-03-21 NOTE — Telephone Encounter (Signed)
Patient called in and states she has been sick for 8-9 days. Symptoms include low appetite, pain in left shoulder that radiates to chest, left breast, left neck and arm. Patient states she used liquid Ibuprofen and it helped a little. However, at night the pain would come back. Patient also states she had a low grade temperature with coughing up green mucus. Patient states she used Asper cream on the area's that hurt. Informed patient to go to the nearest Urgent Care or Emergency Department to be treated due to [place] not having a nurse practitioner or physicians assistant, and patient verbalized understanding and states that she will go to [facility] to be seen.

## 2018-04-04 NOTE — Telephone Encounter (Signed)
Line busy will try again later

## 2018-04-04 NOTE — Telephone Encounter (Signed)
Patient was very upset that she was not notified that NP Wyline Beady left the practice. I apologized to patient.  Pt stated she contacted our office a few weeks ago about some concerns she was having and she reported she did not go to ED. Patient is scheduled for new patient appointment with Dr.Mason on 06/07/18. Patient was upset that the only availability was a 12:30pm slot and no early morning. Pt stated she feels like [employer] is slapping her in the face right now. Advised patient that if she needed to be seen for anything acute to contact office to be scheduled. Patient verbalized understanding.

## 2018-04-04 NOTE — Telephone Encounter (Signed)
Patient called in upset wanting to schedule a f/u from a call she made on 03/21/18. Patient states she was not notified that Dr. Earlie Counts was leaving and needs to be ASAP. She states she her symptoms have increased and she does not want to go to urgent care or the emergency room. She would like a complete check over and would like to meet her new doctor. Patient can be reached at [phone] to discuss this further

## 2018-04-04 NOTE — Telephone Encounter (Signed)
Mailbox full

## 2018-04-26 ENCOUNTER — Encounter: Payer: Self-pay | Admitting: Family Medicine

## 2018-04-26 ENCOUNTER — Ambulatory Visit (INDEPENDENT_AMBULATORY_CARE_PROVIDER_SITE_OTHER): Payer: Medicare Other

## 2018-04-26 ENCOUNTER — Ambulatory Visit (INDEPENDENT_AMBULATORY_CARE_PROVIDER_SITE_OTHER): Payer: Medicare Other | Admitting: Family Medicine

## 2018-04-26 VITALS — BP 113/69 | HR 75 | Temp 98.7°F | Ht 66.0 in | Wt 146.0 lb

## 2018-04-26 DIAGNOSIS — R0789 Other chest pain: Secondary | ICD-10-CM

## 2018-04-26 DIAGNOSIS — R05 Cough: Secondary | ICD-10-CM | POA: Diagnosis not present

## 2018-04-26 DIAGNOSIS — J439 Emphysema, unspecified: Secondary | ICD-10-CM

## 2018-04-26 DIAGNOSIS — K0889 Other specified disorders of teeth and supporting structures: Secondary | ICD-10-CM | POA: Diagnosis not present

## 2018-04-26 DIAGNOSIS — R059 Cough, unspecified: Secondary | ICD-10-CM

## 2018-04-26 MED ORDER — CEFDINIR 250 MG/5ML PO SUSR: 300.0000 mg | Freq: Two times a day (BID) | ORAL | 0 refills | Status: AC

## 2018-04-26 MED ORDER — SULFAMETHOXAZOLE-TRIMETHOPRIM 800-160 MG PO TABS: 1.0000 | ORAL_TABLET | Freq: Two times a day (BID) | ORAL | 0 refills | Status: DC

## 2018-04-26 MED ORDER — PREDNISONE 20 MG PO TABS: 40.0000 mg | ORAL_TABLET | Freq: Every day | ORAL | 0 refills | Status: DC

## 2018-04-26 NOTE — Progress Notes (Signed)
Acute Office Visit  Subjective:    Patient ID: Jodi Kaufman, female    DOB: November 26, 1940, 77 y.o.   MRN: [id number]  Chief Complaint  Patient presents with  . Cough    pt reports that she has had a cough for 8 days her cough has been dry she has taken robin    HPI Patient is in today for infected tooth.  Is been going on for about a year she has a couple of teeth that actually need to be extracted.  She is been having some pain particularly in the right frontal area on the upper jaw that is been radiating towards her right ear.  Reports that about 4 weeks ago she was having some pain between her left shoulder and her neck that was radiating up into her neck towards her ear and down into her chest.  She thought maybe she had initially slept on it wrong but it lasted literally for a couple of weeks.  When it started to improve slightly she is started to develop a more dry cough which has now been going on for a couple of weeks though she still has chest pain especially when she takes a deep breath.  No fevers chills or sweats.  No significant sinus congestion.  Has had a dry cough for 8 days. Taking Robitussion. She is getting some yellow mucous.    Past Medical History:  Diagnosis Date  . Breast cancer [facility])     Past Surgical History:  Procedure Laterality Date  . BREAST BIOPSY    . MASTECTOMY    . MASTECTOMY W/ SENTINEL NODE BIOPSY     Right. 18 years ago.      Family History  Problem Relation Age of Onset  . Heart disease Mother   . Anemia Mother        penicous anemia.   . Alzheimer's disease Mother     Social History   Socioeconomic History  . Marital status: Single    Spouse name: Not on file  . Number of children: Not on file  . Years of education: Not on file  . Highest education level: Not on file  Occupational History  . Not on file  Social Needs  . Financial resource strain: Not on file  . Food insecurity:    Worry: Not on file    Inability: Not on  file  . Transportation needs:    Medical: Not on file    Non-medical: Not on file  Tobacco Use  . Smoking status: Never Smoker  . Smokeless tobacco: Never Used  Substance and Sexual Activity  . Alcohol use: Never    Frequency: Never    Comment: Rarley.    . Drug use: Never  . Sexual activity: Not Currently  Lifestyle  . Physical activity:    Days per week: Not on file    Minutes per session: Not on file  . Stress: Not on file  Relationships  . Social connections:    Talks on phone: Not on file    Gets together: Not on file    Attends religious service: Not on file    Active member of club or organization: Not on file    Attends meetings of clubs or organizations: Not on file    Relationship status: Not on file  . Intimate partner violence:    Fear of current or ex partner: Not on file    Emotionally abused: Not on file    Physically  abused: Not on file    Forced sexual activity: Not on file  Other Topics Concern  . Not on file  Social History Narrative   Lives in [place] but stays with her daughter 1-2 weeks out of the month.      Outpatient Medications Prior to Visit  Medication Sig Dispense Refill  . Calcium Citrate (CITRACAL PO) Take by mouth.    . Multiple Vitamins-Minerals (CENTRUM SILVER 50+WOMEN PO) Take 1 tablet by mouth daily.    Marland Kitchen nystatin cream (MYCOSTATIN) Apply 1 application topically 2 (two) times daily. 30 g 0   No facility-administered medications prior to visit.     Allergies  Allergen Reactions  . Clavulanic Acid Hives  . Tamoxifen Nausea Only    ROS     Objective:    Physical Exam  Constitutional: She is oriented to person, place, and time. She appears well-developed and well-nourished.  HENT:  Head: Normocephalic and atraumatic.  Right Ear: External ear normal.  Left Ear: External ear normal.  Nose: Nose normal.  Mouth/Throat: Oropharynx is clear and moist.  TMs and canals are clear.   Eyes: Pupils are equal, round, and reactive to  light. Conjunctivae and EOM are normal.  Neck: Neck supple. No thyromegaly present.  Cardiovascular: Normal rate, regular rhythm and normal heart sounds.  Pulmonary/Chest: Effort normal and breath sounds normal. She has no wheezes.  Lymphadenopathy:    She has no cervical adenopathy.  Neurological: She is alert and oriented to person, place, and time.  Skin: Skin is warm and dry.  Psychiatric: She has a normal mood and affect.    BP 113/69   Pulse 75   Temp 98.7 F (37.1 C)   Ht 5\' 6"  (1.676 m)   Wt 146 lb (66.2 kg)   SpO2 98%   BMI 23.57 kg/m  Wt Readings from Last 3 Encounters:  04/26/18 146 lb (66.2 kg)  02/09/18 148 lb (67.1 kg)  12/14/11 146 lb (66.2 kg)    Health Maintenance Due  Topic Date Due  . TETANUS/TDAP  08/08/1960  . DEXA SCAN  08/09/2006  . PNA vac Low Risk Adult (2 of 2 - PCV13) 01/27/2008    There are no preventive care reminders to display for this patient.   No results found for: TSH Lab Results  Component Value Date   WBC 6.1 07/02/2007   HGB 11.1 (L) 07/02/2007   HCT 31.7 (L) 07/02/2007   MCV 88.0 07/02/2007   PLT 217 07/02/2007   Lab Results  Component Value Date   NA 140 07/02/2007   K 3.5 07/02/2007   CO2 27 07/02/2007   GLUCOSE 105 (H) 07/02/2007   BUN 3 (L) 07/02/2007   CREATININE 0.74 07/02/2007   BILITOT 0.8 06/30/2007   ALKPHOS 59 06/30/2007   AST 19 06/30/2007   ALT 14 06/30/2007   PROT 6.4 06/30/2007   ALBUMIN 3.8 06/30/2007   CALCIUM 8.7 07/02/2007   No results found for: CHOL No results found for: HDL No results found for: LDLCALC No results found for: TRIG No results found for: CHOLHDL No results found for: HGBA1C     Assessment & Plan:   Problem List Items Addressed This Visit    None    Visit Diagnoses    Cough    -  Primary   Relevant Orders   DG Chest 2 View (Completed)   Atypical chest pain       Relevant Orders   DG Chest 2 View (Completed)  EKG 12-Lead   Pain, dental         Cough - most  consistant with a COPD exacerbation but she doesn't have a formal dx. Will tx with ABX and prednisone.  Chest x-ray shows emphysema but no sign of pneumonia which is reassuring.  Note she has a difficult time swallowing pills and would prefer a liquid.  Thus sent a prescription for Omnicef.  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  Dental pain-encouraged her to get in with a dentist as soon as possible it sounds like she needs some extensive dental work.   Meds ordered this encounter  Medications  . DISCONTD: sulfamethoxazole-trimethoprim (BACTRIM DS,SEPTRA DS) 800-160 MG tablet    Sig: Take 1 tablet by mouth 2 (two) times daily.    Dispense:  20 tablet    Refill:  0  . predniSONE (DELTASONE) 20 MG tablet    Sig: Take 2 tablets (40 mg total) by mouth daily with breakfast.    Dispense:  10 tablet    Refill:  0  . cefdinir (OMNICEF) 250 MG/5ML suspension    Sig: Take 6 mLs (300 mg total) by mouth 2 (two) times daily for 10 days.    Dispense:  120 mL    Refill:  0       [name], MD

## 2018-04-29 ENCOUNTER — Telehealth: Payer: Self-pay

## 2018-04-29 NOTE — Telephone Encounter (Addendum)
Jodi Kaufman went back to Jodi Kaufman yesterday. She state she has misplaced the last of the [facility]. She wanted to know if she still needs to complete the antibiotic. If so she will need it sent to Jodi Kaufman. She states she is 90 % better and wanted to know if she need the rest. She states she doesn't need the liquid form because it cost too much.

## 2018-04-29 NOTE — Telephone Encounter (Signed)
If she is much better then I think she is OK to not finish the antibiotic.

## 2018-04-29 NOTE — Telephone Encounter (Signed)
Patient advised.

## 2018-05-02 ENCOUNTER — Ambulatory Visit: Attending: Hematology & Oncology | Primary: Family Medicine

## 2018-05-02 ENCOUNTER — Inpatient Hospital Stay: Admit: 2018-05-02 | Payer: MEDICARE | Primary: Family Medicine

## 2018-05-02 ENCOUNTER — Ambulatory Visit
Admit: 2018-05-02 | Discharge: 2018-05-02 | Payer: MEDICARE | Attending: Hematology & Oncology | Primary: Family Medicine

## 2018-05-02 ENCOUNTER — Inpatient Hospital Stay: Admit: 2018-05-02 | Primary: Family Medicine

## 2018-05-02 DIAGNOSIS — Z853 Personal history of malignant neoplasm of breast: Secondary | ICD-10-CM

## 2018-05-02 DIAGNOSIS — Z9011 Acquired absence of right breast and nipple: Secondary | ICD-10-CM

## 2018-05-02 LAB — CBC WITH 3 PART DIFF
ABS. LYMPHOCYTES: 1.2 10*3/uL (ref 1.1–5.9)
ABS. MIXED CELLS: 0.6 10*3/uL (ref 0.0–2.3)
ABS. NEUTROPHILS: 6.7 10*3/uL (ref 1.8–9.5)
ABSOLUTE MIXED CELLS: 0.6 10*3/uL (ref 0.0–2.3)
HCT: 46.4 % (ref 36–48)
HGB: 15.4 g/dL (ref 12.0–16)
Hematocrit: 46.4 % (ref 36–48)
Hemoglobin: 15.4 g/dL (ref 12.0–16)
LYMPHOCYTES: 14 % (ref 14–44)
Lymphocytes %: 14 % (ref 14–44)
Lymphocytes Absolute: 1.2 10*3/uL (ref 1.1–5.9)
MCH: 30 PG (ref 25.0–35.0)
MCH: 30 PG (ref 25.0–35.0)
MCHC: 33.2 g/dL (ref 31–37)
MCHC: 33.2 g/dL (ref 31–37)
MCV: 90.4 FL (ref 78–102)
MCV: 90.4 FL (ref 78–102)
Mixed Cells: 7 % (ref 0.1–17)
Mixed cells: 7 % (ref 0.1–17)
NEUTROPHILS: 79 % — ABNORMAL HIGH (ref 40–70)
Neutrophils %: 79 % — ABNORMAL HIGH (ref 40–70)
Neutrophils Absolute: 6.7 10*3/uL (ref 1.8–9.5)
PLATELET: 266 10*3/uL (ref 140–440)
Platelets: 266 10*3/uL (ref 140–440)
RBC: 5.13 M/uL — ABNORMAL HIGH (ref 4.10–5.10)
RBC: 5.13 M/uL — ABNORMAL HIGH (ref 4.10–5.10)
RDW: 13.1 % (ref 11.5–14.5)
RDW: 13.1 % (ref 11.5–14.5)
WBC: 8.5 10*3/uL (ref 4.5–13.0)
WBC: 8.5 10*3/uL (ref 4.5–13.0)

## 2018-05-02 NOTE — Progress Notes (Deleted)
Hematology/Oncology  Progress Note    Name: Jamie [name]  Date: 05/02/2018  DOB: 28-Dec-1940    [name], Adele Barthel, NP     Jamie [name] is a 78 y.o. year old female who was seen for ***.      Subjective:     Patient has no complaint of ***.Marland Kitchen     Past medical history, family history, and social history: these were reviewed and remains unchanged.    Past Medical History:   Diagnosis Date   ??? Allergic rhinitis    ??? Arthritis    ??? Cancer ([place])     breast   ??? Colon polyps    ??? Depression    ??? Diverticulosis    ??? GERD (gastroesophageal reflux disease)    ??? Hepatomegaly    ??? IBS (irritable bowel syndrome)      Past Surgical History:   Procedure Laterality Date   ??? ENDOSCOPY, COLON, DIAGNOSTIC     ??? HX HYSTERECTOMY     ??? HX MASTECTOMY      modified right   ??? HX TONSILLECTOMY     ??? HX WISDOM TEETH EXTRACTION       Social History     Socioeconomic History   ??? Marital status: SINGLE     Spouse name: Not on file   ??? Number of children: Not on file   ??? Years of education: Not on file   ??? Highest education level: Not on file   Occupational History   ??? Not on file   Social Needs   ??? Financial resource strain: Not on file   ??? Food insecurity:     Worry: Not on file     Inability: Not on file   ??? Transportation needs:     Medical: Not on file     Non-medical: Not on file   Tobacco Use   ??? Smoking status: Never Smoker   ??? Smokeless tobacco: Never Used   Substance and Sexual Activity   ??? 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 Drug use: No   ??? Sexual activity: Never   Lifestyle   ??? Physical activity:     Days per week: Not on file     Minutes per session: Not on file   ??? Stress: Not on file   Relationships   ??? Social connections:     Talks on phone: Not on file     Gets together: Not on file     Attends religious service: Not on file     Active member of club or organization: Not on file     Attends meetings of clubs or organizations: Not on file     Relationship status: Not on file    ??? Intimate partner violence:     Fear of current or ex partner: Not on file     Emotionally abused: Not on file     Physically abused: Not on file     Forced sexual activity: Not on file   Other Topics Concern   ??? Not on file   Social History Narrative   ??? Not on file     Family History   Problem Relation Age of Onset   ??? Cancer Father         lung   ??? Other Mother         hyperthyroidism   ??? Coronary Artery Disease Mother    ???  Hypertension Mother    ??? Elevated Lipids Brother    ??? Cancer Brother         colon cancer   ??? Hypertension Brother      Current Outpatient Medications   Medication Sig Dispense Refill   ??? atorvastatin (LIPITOR) 10 mg tablet Take 1 Tab by mouth daily. 90 Tab 1   ??? multivit-min/iron/folic/lutein (CENTRUM SILVER WOMEN PO) Take  by mouth.     ??? varicella-zoster recombinant, PF, (SHINGRIX, PF,) 50 mcg/0.5 mL susr injection First injection IM then second in 2 to 6 months after first 0.5 mL 1   ??? calcium citrate-vitamin D3 (CITRACAL + D) tablet Take 1 Tab by mouth two (2) times a day.         Review of Systems      Objective:     Visit Vitals  BP 129/80   Pulse 82   Temp 98.2 ??F (36.8 ??C) (Oral)   Resp 18   Ht 5\' 6"  (1.676 m)   Wt 67.1 kg (148 lb)   SpO2 97%   BMI 23.89 kg/m??     ECOG PS***  Physical Exam:   Gen. Appearance: The patient is in no acute distress.  Skin: There is no bruise or rash.  HEENT: The exam is unremarkable.  Neck: Supple without lymphadenopathy or thyromegaly.  Lungs: Clear to auscultation and percussion; there are no wheezes or rhonchi.  Heart: Regular rate and rhythm; there are no murmurs, gallops, or rubs.  Abdomen: Bowel sounds are present and normal.  There is no guarding, tenderness, or hepatosplenomegaly.  Extremities: There is no clubbing, cyanosis, or edema.  Neurologic: There are no focal neurologic deficits.  Lymphatics: There is no palpable peripheral lymphadenopathy. Musculoskeletal: The patient has full range of motion at all joints.  There is no evidence of  joint deformity or effusions.  There is no focal joint tenderness.  Psychological/psychiatric: There is no clinical evidence of anxiety, depression, or melancholy.    Lab data:      No results found for this or any previous visit.        Assessment:     1. History of breast cancer    2. History of left mastectomy    3. History of uterine cancer          Plan:     Orders Placed This Encounter   ??? COMPLETE CBC & AUTO DIFF WBC   ??? CT CHEST ABD PELV W CONT     Standing Status:   Future     Standing Expiration Date:   06/03/2019     Order Specific Question:   Reason for Exam     Answer:   HISTORY OF BREAST CANCER     Order Specific Question:   STAT Creatinine as indicated     Answer:   Yes     Order Specific Question:   This order utilizes IV contrast.  What additional contrast is needed?     Answer:   Per Radiologist Protocol   ??? InHouse CBC ([facility])     Standing Status:   Future     Standing Expiration Date:   1/61/0960   ??? METABOLIC PANEL, COMPREHENSIVE     Standing Status:   Future     Standing Expiration Date:   05/03/2019   ??? CA 27.29     Standing Status:   Future     Standing Expiration Date:   07/30/4096   ??? METABOLIC PANEL, COMPREHENSIVE  Standing Status:   Future     Standing Expiration Date:   05/03/2019   ??? CA 27.29     Standing Status:   Future     Standing Expiration Date:   05/03/2019       Suzy Bouchard, MD  05/02/2018      Please note: This document has been produced using voice recognition software.  Unrecognized errors in transcription may be present.

## 2018-05-02 NOTE — Progress Notes (Signed)
Hematology/Oncology Consult Note    Name: Jamie [name]  Date: 05/02/2018  DOB: [birth year]      Primary Care Provider: Dr. Lieutenant [name]  Jamie [name] is a 78 y.o. year old female with a history of breast cancer and uterine cancer.     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 She also has a history of diverticulosis, GERD and anxiety. She was originally diagnosed with Stage IIA breast cancer in July 1995, lymph node positive ER positive, s/p right mastectomy and completed 2 months of Tamoxifen. She followed up for many years but then missed many appointments.  Her most recent appointment in our clinic was in February 2014 where she was seen for routine follow up and at that time CT scans were ordered.  She reports that since her last clinic visit she has been spending a lot of time with her family in [place].  She did establish with a clinic there who recently did an x-ray and told her that she had abnormal findings on her lungs.  They also told her that she may have emphysema.  Her most recent memory 2018 with a unilateral left breast mammography showing no evidence of disease.  Due to  her history of both breast cancer and uterine cancer she decided to come here for further assessment to rule out any evidence of recurrent or metastatic disease.      Past medical history, family history, and social history were reviewed and are unchanged.    Past Medical History   Diagnosis Date   ??? Diverticulosis    ??? Hepatomegaly    ??? GERD (gastroesophageal reflux disease)    ??? IBS (irritable bowel syndrome)    ??? Allergic rhinitis    ??? Cancer      breast   ??? Colon polyps     Urinary incontinence    Past Surgical History   Procedure Laterality Date   ??? Endoscopy, colon, diagnostic     ??? Hx hysterectomy      ??? Hx mastectomy and axillary lymph node dissection       modified right   ??? Hx tonsillectomy     ??? Hx wisdom teeth extraction       History     Social History   ??? Marital Status: divorced     Spouse Name: N/A     Number of Children: N/A   ??? Years of Education: N/A     Occupational History   ??? Works as a Scientist, clinical (histocompatibility and immunogenetics) at [employer]   ??? Smoking status: Former Smoker   ??? Smokeless tobacco: Never Used   ??? Alcohol Use: No   ??? Drug Use: No   ??? Sexually Active:      Other Topics Concern   ??? Not on file     Social History Narrative   ??? 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 COPD Mother    ??? Cancer Father      lung   ??? Elevated Lipids Brother    ??? Other Mother  hyperthyroidism   ??? Coronary Artery Disease Mother    ??? Hypertension Mother      Current Outpatient Prescriptions   Medication Sig Dispense Refill   ??? omeprazole (PRILOSEC) 40 mg capsule Take 1 Cap by mouth daily.  30 Cap  3   ??? Caltrate +D        No current facility-administered medications for this visit.       Review of Systems  Constitutional: The patient has no acute distress or discomfort.  HEENT: The patient denies recent head trauma, eye pain, blurred vision,  hearing deficit, oropharyngeal mucosal pain or lesions, and the patient denies throat pain or discomfort.  Lymphatics: The patient denies palpable peripheral lymphadenopathy.  Hematologic: The patient denies having bruising, bleeding, or progressive fatigue.  Respiratory: Patient denies having shortness of breath, cough, sputum production, fever, or dyspnea on exertion.  Cardiovascular: The patient denies having leg pain, leg swelling, heart palpitations, chest permit, chest pain, or lightheadedness.  The patient denies having dyspnea on exertion.  Gastrointestinal: The patient denies having nausea, emesis, or diarrhea.  The patient denies having any hematemesis or blood in the stool.  Genitourinary: Patient denies having urinary urgency, frequency, or dysuria.  The patient denies having blood in the urine.  Psychological: The patient denies having symptoms of nervousness, anxiety, depression, or thoughts of harming self.  Skin: Patient denies having skin rashes, skin, ulcerations, or unexplained itching or pruritus.  Musculoskeletal: The patient denies having pain in the joints or bones.        Objective:     Visit Vitals  BP 129/80   Pulse 82   Temp 98.2 ??F (36.8 ??C) (Oral)   Resp 18   Ht 5' 6"  (1.676 m)   Wt 67.1 kg (148 lb)   SpO2 97%   BMI 23.89 kg/m??       Physical Exam:   PS/ECOG: 100  General: Well appearing, in NAD  Skin: examination of the skin reveals no bruising, rash or petechiae  HEENT: Normocephalic, atraumatic. Conjunctiva and sclera are clear. Pupils are equal, round and reactive to light. EOMs are intact. ENT without oral mucosal lesions, stomatitis or thrush  Neck: supple without lymphadenopathy, JVD or thyromegaly  Lymphatics: no palpable cervical, supraclavicular, axillary or inguinal lymphadenopathy  Anterior chest wall and breasts. Right breast surgically absent. Left breast without mass, nipple discharge or skin retraction. No axillary adenopathy bilaterally.   Lungs: clear breath sounds bilaterally, no rhonchi or wheezes noted  Heart: Regular rate and rhythm, no murmurs, rubs or gallops, S1-S2 noted. Positive peripheral pulses bilaterally upper and lower extremities  Abdomen: soft, non-tender, non-distended, no HSM, positive bowel sounds  Extremities: without clubbing, cyanosis or edema  Neurologic: no focal deficits, steady gait, Alert and oriented x 3.  Psychologic: mood and affect are appropriate, no anxiety or depression noted    Laboratory Data:     Results for orders placed or performed during the hospital encounter of 09/08/17   CBC WITH AUTOMATED DIFF   Result Value Ref Range     WBC 6.0 4.6 - 13.2 K/uL    RBC 4.99 4.20 - 5.30 M/uL    HGB 15.0 12.0 - 16.0 g/dL    HCT 47.1 (H) 35.0 - 45.0 %    MCV 94.4 74.0 - 97.0 FL    MCH 30.1 24.0 - 34.0 PG    MCHC 31.8 31.0 - 37.0 g/dL    RDW 14.3 11.6 - 14.5 %    PLATELET 267 135 -  420 K/uL    MPV 10.5 9.2 - 11.8 FL    NEUTROPHILS 66 40 - 73 %    LYMPHOCYTES 23 21 - 52 %    MONOCYTES 8 3 - 10 %    EOSINOPHILS 2 0 - 5 %    BASOPHILS 1 0 - 2 %    ABS. NEUTROPHILS 4.0 1.8 - 8.0 K/UL    ABS. LYMPHOCYTES 1.4 0.9 - 3.6 K/UL    ABS. MONOCYTES 0.5 0.05 - 1.2 K/UL    ABS. EOSINOPHILS 0.1 0.0 - 0.4 K/UL    ABS. BASOPHILS 0.0 0.0 - 0.1 K/UL    DF AUTOMATED     METABOLIC PANEL, COMPREHENSIVE   Result Value Ref Range    Sodium 141 136 - 145 mmol/L    Potassium 4.3 3.5 - 5.5 mmol/L    Chloride 107 100 - 108 mmol/L    CO2 30 21 - 32 mmol/L    Anion gap 4 3.0 - 18 mmol/L    Glucose 98 74 - 99 mg/dL    BUN 20 (H) 7.0 - 18 MG/DL    Creatinine 0.86 0.6 - 1.3 MG/DL    BUN/Creatinine ratio 23 (H) 12 - 20      GFR est AA >60 >60 ml/min/1.72m    GFR est non-AA >60 >60 ml/min/1.730m   Calcium 9.2 8.5 - 10.1 MG/DL    Bilirubin, total 0.8 0.2 - 1.0 MG/DL    ALT (SGPT) 18 13 - 56 U/L    AST (SGOT) 17 15 - 37 U/L    Alk. phosphatase 64 45 - 117 U/L    Protein, total 6.8 6.4 - 8.2 g/dL    Albumin 3.9 3.4 - 5.0 g/dL    Globulin 2.9 2.0 - 4.0 g/dL    A-G Ratio 1.3 0.8 - 1.7     LIPID PANEL   Result Value Ref Range    LIPID PROFILE          Cholesterol, total 217 (H) <200 MG/DL    Triglyceride 105 <150 MG/DL    HDL Cholesterol 67 (H) 40 - 60 MG/DL    LDL, calculated 129 (H) 0 - 100 MG/DL    VLDL, calculated 21 MG/DL    CHOL/HDL Ratio 3.2 0 - 5.0     TSH 3RD GENERATION   Result Value Ref Range    TSH 2.13 0.36 - 3.74 uIU/mL   VITAMIN D, 25 HYDROXY   Result Value Ref Range    Vitamin D 25-Hydroxy 25.9 (L) 30 - 100 ng/mL        Patient Active Problem List   Diagnosis Code   ??? History of right breast cancer: Stage IIB infiltrating dutal carcinoma R breast 1995 Z85.3    ??? History of uterine cancer: hysterectomy ovaries intact Z85.42   ??? Vitamin D deficiency E55.9   ??? GERD (gastroesophageal reflux disease) K21.9   ??? History of osteopenia T score -1.9 (2008)  Z87.39         Assessment:     1. History of breast cancer    2. History of right mastectomy    3. History of uterine cancer          Plan:   History of right breast cancer and right modified radical mastectomy: The patient has not been seen in clinic since February 2014.  At this time we will check a comprehensive metabolic panel and CA 2003???47evel.. Marland KitchenShe was recently told that she had an abnormal  chest x-ray.  With her history of both breast cancer and uterine cancer CT scans to the chest, abdomen, and pelvis will be done to rule out recurrent disease.    History of uterine cancer: CT scans of the abdomen pelvis will be obtained to rule out any evidence of recurrent disease.         Orders Placed This Encounter   ??? COMPLETE CBC & AUTO DIFF WBC   ??? CT CHEST ABD PELV W CONT     Standing Status:   Future     Standing Expiration Date:   06/03/2019     Order Specific Question:   Reason for Exam     Answer:   HISTORY OF BREAST CANCER     Order Specific Question:   STAT Creatinine as indicated     Answer:   Yes     Order Specific Question:   This order utilizes IV contrast.  What additional contrast is needed?     Answer:   Per Radiologist Protocol   ??? InHouse CBC ([facility])     Standing Status:   Future     Standing Expiration Date:   12/13/2991   ??? METABOLIC PANEL, COMPREHENSIVE     Standing Status:   Future     Standing Expiration Date:   05/03/2019   ??? CA 27.29     Standing Status:   Future     Standing Expiration Date:   10/25/6965   ??? METABOLIC PANEL, COMPREHENSIVE     Standing Status:   Future     Standing Expiration Date:   05/03/2019   ??? CA 27.29     Standing Status:   Future     Standing Expiration Date:   05/03/2019       Suzy Bouchard, MD  05/02/2018

## 2018-05-02 NOTE — Patient Instructions (Signed)
Breast Cancer: Care Instructions  Your Care Instructions    Breast cancer occurs when abnormal cells grow out of control in the breast. These cancer cells can spread within the breast, to nearby lymph nodes and other tissues, and to other parts of the body.  Being treated for cancer can weaken your body, and you may feel very tired. Get the rest your body needs so you can feel better.  Finding out that you have cancer is scary. You may feel many emotions and may need some help coping. Seek out family, friends, and counselors for support. You also can do things at home to make yourself feel better while you go through treatment. Call the [employer] (1-800-227-2345) or visit its website at www.cancer.org for more information.  Follow-up care is a key part of your treatment and safety. Be sure to make and go to all appointments, and call your doctor if you are having problems. It's also a good idea to know your test results and keep a list of the medicines you take.  How can you care for yourself at home?  ?? Take your medicines exactly as prescribed. Call your doctor if you think you are having a problem with your medicine. You may get medicine for nausea and vomiting if you have these side effects.  ?? Follow your doctor's instructions to relieve pain. Pain from cancer and surgery can almost always be controlled. Use pain medicine when you first notice pain, before it becomes severe.  ?? Eat healthy food. If you do not feel like eating, try to eat food that has protein and extra calories to keep up your strength and prevent weight loss. Drink liquid meal replacements for extra calories and protein. Try to eat your main meal early.  ?? Get some physical activity every day, but do not get too tired. Keep doing the hobbies you enjoy as your energy allows.  ?? Do not smoke. Smoking can make your cancer worse. If you need help quitting, talk to your doctor about stop-smoking programs and medicines.  These can increase your chances of quitting for good.  ?? Take steps to control your stress and workload. Learn relaxation techniques.  ? Share your feelings. Stress and tension affect our emotions. By expressing your feelings to others, you may be able to understand and cope with them.  ? Consider joining a support group. Talking about a problem with your spouse, a good friend, or other people with similar problems is a good way to reduce tension and stress.  ? Express yourself through art. Try writing, crafts, dance, or art to relieve stress. Some dance, writing, or art groups may be available just for people who have cancer.  ? Be kind to your body and mind. Getting enough sleep, eating a healthy diet, and taking time to do things you enjoy can contribute to an overall feeling of balance in your life and can help reduce stress.  ? Get help if you need it. Discuss your concerns with your doctor or counselor.  ?? If you are vomiting or have diarrhea:  ? Drink plenty of fluids (enough so that your urine is light yellow or clear like water) to prevent dehydration. Choose water and other caffeine-free clear liquids. If you have kidney, heart, or liver disease and have to limit fluids, talk with your doctor before you increase the amount of fluids you drink.  ? When you are able to eat, try clear soups, mild foods, and liquids until   all symptoms are gone for 12 to 48 hours. Other good choices include dry toast, crackers, cooked cereal, and gelatin dessert, such as Jell-O.  ?? If you have not already done so, prepare a list of advance directives. Advance directives are instructions to your doctor and family members about what kind of care you want if you become unable to speak or express yourself.  When should you call for help?  Call 911 anytime you think you may need emergency care. For example, call if:  ?? ?? You passed out (lost consciousness).   ??Call your doctor now or seek immediate medical care if:   ?? ?? You have a fever.   ?? ?? You have abnormal bleeding.   ?? ?? You think you have an infection.   ?? ?? You have new or worse pain.   ?? ?? You have new symptoms, such as a cough, belly pain, vomiting, diarrhea, or a rash.   ??Watch closely for changes in your health, and be sure to contact your doctor if:  ?? ?? You are much more tired than usual.   ?? ?? You have swollen glands in your armpits, groin, or neck.   ?? ?? You do not get better as expected.   Where can you learn more?  Go to http://www.healthwise.net/[employer].  Enter V321 in the search box to learn more about "Breast Cancer: Care Instructions."  Current as of: April 14, 2017  Content Version: 12.2  ?? 2006-2019 [employer]. Care instructions adapted under license by [employer] (which disclaims liability or warranty for this information). If you have questions about a medical condition or this instruction, always ask your healthcare professional. [employer] disclaims any warranty or liability for your use of this information.

## 2018-05-02 NOTE — Progress Notes (Signed)
Hematology/Oncology Consult Note    Name: Jamie [name]  Date: 05/02/2018  DOB: [birth year]      Primary Care Provider: Dr. Lieutenant Diego  Jamie [name] is a 78 y.o. year old female with a history of breast cancer and uterine cancer.     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 She also has a history of diverticulosis, GERD and anxiety. She was originally diagnosed with Stage IIA breast cancer in July 1995, lymph node positive ER positive, s/p right mastectomy and completed 2 months of Tamoxifen. She followed up for many years but then missed many appointments.  Her most recent appointment in our clinic was in February 2014 where she was seen for routine follow up and at that time CT scans were ordered.  She reports that since her last clinic visit she has been spending a lot of time with her family in [place].  She did establish with a clinic there who recently did an x-ray and told her that she had abnormal findings on her lungs.  They also told her that she may have emphysema.  Her most recent memory 2018 with a unilateral left breast mammography showing no evidence of disease.  Due to  her history of both breast cancer and uterine cancer she decided to come here for further assessment to rule out any evidence of recurrent or metastatic disease.      Past medical history, family history, and social history were reviewed and are unchanged.    Past Medical History   Diagnosis Date   ??? Diverticulosis    ??? Hepatomegaly    ??? GERD (gastroesophageal reflux disease)    ??? IBS (irritable bowel syndrome)    ??? Allergic rhinitis    ??? Cancer      breast   ??? Colon polyps     Urinary incontinence    Past Surgical History   Procedure Laterality Date   ??? Endoscopy, colon, diagnostic     ??? Hx hysterectomy     ??? Hx mastectomy and  axillary lymph node dissection       modified right   ??? Hx tonsillectomy     ??? Hx wisdom teeth extraction       History     Social History   ??? Marital Status: divorced     Spouse Name: N/A     Number of Children: N/A   ??? Years of Education: N/A     Occupational History   ??? Works as a Scientist, clinical (histocompatibility and immunogenetics) at [employer]   ??? Smoking status: Former Smoker   ??? Smokeless tobacco: Never Used   ??? Alcohol Use: No   ??? Drug Use: No   ??? Sexually Active:      Other Topics Concern   ??? Not on file     Social History Narrative   ??? 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 COPD Mother    ??? Cancer Father      lung   ??? Elevated Lipids Brother    ??? Other Mother  hyperthyroidism   ??? Coronary Artery Disease Mother    ??? Hypertension Mother      Current Outpatient Prescriptions   Medication Sig Dispense Refill   ??? omeprazole (PRILOSEC) 40 mg capsule Take 1 Cap by mouth daily.  30 Cap  3   ??? Caltrate +D        No current facility-administered medications for this visit.       Review of Systems  Constitutional: The patient has no acute distress or discomfort.  HEENT: The patient denies recent head trauma, eye pain, blurred vision,  hearing deficit, oropharyngeal mucosal pain or lesions, and the patient denies throat pain or discomfort.  Lymphatics: The patient denies palpable peripheral lymphadenopathy.  Hematologic: The patient denies having bruising, bleeding, or progressive fatigue.  Respiratory: Patient denies having shortness of breath, cough, sputum production, fever, or dyspnea on exertion.  Cardiovascular: The patient denies having leg pain, leg swelling, heart palpitations, chest permit, chest pain, or lightheadedness.  The patient denies having dyspnea on exertion.  Gastrointestinal: The patient denies having nausea, emesis, or diarrhea. The patient denies having any hematemesis or blood in the stool.  Genitourinary:  Patient denies having urinary urgency, frequency, or dysuria.  The patient denies having blood in the urine.  Psychological: The patient denies having symptoms of nervousness, anxiety, depression, or thoughts of harming self.  Skin: Patient denies having skin rashes, skin, ulcerations, or unexplained itching or pruritus.  Musculoskeletal: The patient denies having pain in the joints or bones.        Objective:     Visit Vitals  BP 129/80   Pulse 82   Temp 98.2 ??F (36.8 ??C) (Oral)   Resp 18   Ht 5' 6"  (1.676 m)   Wt 67.1 kg (148 lb)   SpO2 97%   BMI 23.89 kg/m??       Physical Exam:   PS/ECOG: 100  General: Well appearing, in NAD  Skin: examination of the skin reveals no bruising, rash or petechiae  HEENT: Normocephalic, atraumatic. Conjunctiva and sclera are clear. Pupils are equal, round and reactive to light. EOMs are intact. ENT without oral mucosal lesions, stomatitis or thrush  Neck: supple without lymphadenopathy, JVD or thyromegaly  Lymphatics: no palpable cervical, supraclavicular, axillary or inguinal lymphadenopathy  Anterior chest wall and breasts. Right breast surgically absent. Left breast without mass, nipple discharge or skin retraction. No axillary adenopathy bilaterally.   Lungs: clear breath sounds bilaterally, no rhonchi or wheezes noted  Heart: Regular rate and rhythm, no murmurs, rubs or gallops, S1-S2 noted. Positive peripheral pulses bilaterally upper and lower extremities  Abdomen: soft, non-tender, non-distended, no HSM, positive bowel sounds  Extremities: without clubbing, cyanosis or edema  Neurologic: no focal deficits, steady gait, Alert and oriented x 3.  Psychologic: mood and affect are appropriate, no anxiety or depression noted    Laboratory Data:     Results for orders placed or performed during the hospital encounter of 09/08/17   CBC WITH AUTOMATED DIFF   Result Value Ref Range    WBC 6.0 4.6 - 13.2 K/uL    RBC 4.99 4.20 - 5.30 M/uL    HGB 15.0 12.0 - 16.0 g/dL    HCT 47.1 (H) 35.0  - 45.0 %    MCV 94.4 74.0 - 97.0 FL    MCH 30.1 24.0 - 34.0 PG    MCHC 31.8 31.0 - 37.0 g/dL    RDW 14.3 11.6 - 14.5 %    PLATELET 267 135 -  420 K/uL    MPV 10.5 9.2 - 11.8 FL    NEUTROPHILS 66 40 - 73 %    LYMPHOCYTES 23 21 - 52 %    MONOCYTES 8 3 - 10 %    EOSINOPHILS 2 0 - 5 %    BASOPHILS 1 0 - 2 %    ABS. NEUTROPHILS 4.0 1.8 - 8.0 K/UL    ABS. LYMPHOCYTES 1.4 0.9 - 3.6 K/UL    ABS. MONOCYTES 0.5 0.05 - 1.2 K/UL    ABS. EOSINOPHILS 0.1 0.0 - 0.4 K/UL    ABS. BASOPHILS 0.0 0.0 - 0.1 K/UL    DF AUTOMATED     METABOLIC PANEL, COMPREHENSIVE   Result Value Ref Range    Sodium 141 136 - 145 mmol/L    Potassium 4.3 3.5 - 5.5 mmol/L    Chloride 107 100 - 108 mmol/L    CO2 30 21 - 32 mmol/L    Anion gap 4 3.0 - 18 mmol/L    Glucose 98 74 - 99 mg/dL    BUN 20 (H) 7.0 - 18 MG/DL    Creatinine 0.86 0.6 - 1.3 MG/DL    BUN/Creatinine ratio 23 (H) 12 - 20      GFR est AA >60 >60 ml/min/1.31m    GFR est non-AA >60 >60 ml/min/1.747m   Calcium 9.2 8.5 - 10.1 MG/DL    Bilirubin, total 0.8 0.2 - 1.0 MG/DL    ALT (SGPT) 18 13 - 56 U/L    AST (SGOT) 17 15 - 37 U/L    Alk. phosphatase 64 45 - 117 U/L    Protein, total 6.8 6.4 - 8.2 g/dL    Albumin 3.9 3.4 - 5.0 g/dL    Globulin 2.9 2.0 - 4.0 g/dL    A-G Ratio 1.3 0.8 - 1.7     LIPID PANEL   Result Value Ref Range    LIPID PROFILE          Cholesterol, total 217 (H) <200 MG/DL    Triglyceride 105 <150 MG/DL    HDL Cholesterol 67 (H) 40 - 60 MG/DL    LDL, calculated 129 (H) 0 - 100 MG/DL    VLDL, calculated 21 MG/DL    CHOL/HDL Ratio 3.2 0 - 5.0     TSH 3RD GENERATION   Result Value Ref Range    TSH 2.13 0.36 - 3.74 uIU/mL   VITAMIN D, 25 HYDROXY   Result Value Ref Range    Vitamin D 25-Hydroxy 25.9 (L) 30 - 100 ng/mL        Patient Active Problem List   Diagnosis Code   ??? 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 History of uterine cancer: hysterectomy ovaries intact Z85.42   ??? Vitamin D deficiency E55.9   ??? GERD (gastroesophageal  reflux disease) K21.9   ??? History of osteopenia T score -1.9 (2008)  Z87.39         Assessment:     1. History of breast cancer    2. History of right mastectomy    3. History of uterine cancer          Plan:   History of right breast cancer and right modified radical mastectomy: The patient has not been seen in clinic since February 2014.  At this time we will check a comprehensive metabolic panel and CA 2044???03evel.. Marland KitchenShe was recently told that she had an abnormal  chest x-ray.  With her history of both breast cancer and uterine cancer CT scans to the chest, abdomen, and pelvis will be done to rule out recurrent disease.    History of uterine cancer: CT scans of the abdomen pelvis will be obtained to rule out any evidence of recurrent disease.         Orders Placed This Encounter   ??? COMPLETE CBC & AUTO DIFF WBC   ??? CT CHEST ABD PELV W CONT     Standing Status:   Future     Standing Expiration Date:   06/03/2019     Order Specific Question:   Reason for Exam     Answer:   HISTORY OF BREAST CANCER     Order Specific Question:   STAT Creatinine as indicated     Answer:   Yes     Order Specific Question:   This order utilizes IV contrast.  What additional contrast is needed?     Answer:   Per Radiologist Protocol   ??? InHouse CBC ([facility])     Standing Status:   Future     Standing Expiration Date:   12/13/2991   ??? METABOLIC PANEL, COMPREHENSIVE     Standing Status:   Future     Standing Expiration Date:   05/03/2019   ??? CA 27.29     Standing Status:   Future     Standing Expiration Date:   10/25/6965   ??? METABOLIC PANEL, COMPREHENSIVE     Standing Status:   Future     Standing Expiration Date:   05/03/2019   ??? CA 27.29     Standing Status:   Future     Standing Expiration Date:   05/03/2019       Suzy Bouchard, MD  05/02/2018

## 2018-05-03 LAB — METABOLIC PANEL, COMPREHENSIVE
A-G Ratio: 1.1 (ref 0.8–1.7)
ALT (SGPT): 15 U/L (ref 13–56)
AST (SGOT): 18 U/L (ref 10–38)
Albumin: 3.7 g/dL (ref 3.4–5.0)
Alk. phosphatase: 63 U/L (ref 45–117)
Anion gap: 6 mmol/L (ref 3.0–18)
BUN/Creatinine ratio: 22 — ABNORMAL HIGH (ref 12–20)
BUN: 19 MG/DL — ABNORMAL HIGH (ref 7.0–18)
Bilirubin, total: 0.6 MG/DL (ref 0.2–1.0)
CO2: 28 mmol/L (ref 21–32)
Calcium: 10.2 MG/DL — ABNORMAL HIGH (ref 8.5–10.1)
Chloride: 107 mmol/L (ref 100–111)
Creatinine: 0.85 MG/DL (ref 0.6–1.3)
GFR est AA: >60 mL/min/{1.73_m2} (ref >60)
GFR est non-AA: >60 mL/min/{1.73_m2} (ref >60)
Globulin: 3.3 g/dL (ref 2.0–4.0)
Glucose: 99 mg/dL (ref 74–99)
Potassium: 4.7 mmol/L (ref 3.5–5.5)
Protein, total: 7 g/dL (ref 6.4–8.2)
Sodium: 141 mmol/L (ref 136–145)

## 2018-05-03 LAB — CA 27.29
CA 27.29, 142134: 40.2 U/mL — ABNORMAL HIGH (ref 0.0–38.6)
CA 27.29: 40.2 U/mL — ABNORMAL HIGH (ref 0.0–38.6)

## 2018-05-03 LAB — COMPREHENSIVE METABOLIC PANEL
ALT: 15 U/L (ref 13–56)
AST: 18 U/L (ref 10–38)
Albumin/Globulin Ratio: 1.1 (ref 0.8–1.7)
Albumin: 3.7 g/dL (ref 3.4–5.0)
Alkaline Phosphatase: 63 U/L (ref 45–117)
Anion Gap: 6 mmol/L (ref 3.0–18)
BUN: 19 MG/DL — ABNORMAL HIGH (ref 7.0–18)
Bun/Cre Ratio: 22 — ABNORMAL HIGH (ref 12–20)
CO2: 28 mmol/L (ref 21–32)
Calcium: 10.2 MG/DL — ABNORMAL HIGH (ref 8.5–10.1)
Chloride: 107 mmol/L (ref 100–111)
Creatinine: 0.85 MG/DL (ref 0.6–1.3)
EGFR IF NonAfrican American: >60 mL/min/{1.73_m2} (ref >60)
GFR African American: >60 mL/min/{1.73_m2} (ref >60)
Globulin: 3.3 g/dL (ref 2.0–4.0)
Glucose: 99 mg/dL (ref 74–99)
Potassium: 4.7 mmol/L (ref 3.5–5.5)
Sodium: 141 mmol/L (ref 136–145)
Total Bilirubin: 0.6 MG/DL (ref 0.2–1.0)
Total Protein: 7 g/dL (ref 6.4–8.2)

## 2018-05-03 NOTE — Telephone Encounter (Signed)
Jamie [name] with [facility] Sched[facility] [phone] calling about  CT/Chest/Abdomin scan. Patient mentioned allergic to contrast. Patient needs pre meds or new order to test. Also ABN triggered dx code. Need dx code for testing.

## 2018-05-05 ENCOUNTER — Encounter: Payer: Self-pay | Admitting: Family Medicine

## 2018-05-06 ENCOUNTER — Ambulatory Visit: Attending: Family Medicine | Primary: Family Medicine

## 2018-05-06 ENCOUNTER — Ambulatory Visit: Admit: 2018-05-06 | Discharge: 2018-05-06 | Payer: MEDICARE | Attending: Family Medicine | Primary: Family Medicine

## 2018-05-06 DIAGNOSIS — Z20828 Contact with and (suspected) exposure to other viral communicable diseases: Secondary | ICD-10-CM

## 2018-05-06 MED ORDER — ALPRAZOLAM 0.25 MG TAB
0.25 mg | ORAL_TABLET | Freq: Once | ORAL | 0 refills | Status: AC | PRN
Start: 2018-05-06 — End: 2018-05-06

## 2018-05-06 MED ORDER — OSELTAMIVIR PHOSPHATE 75 MG CAP
75 mg | ORAL_CAPSULE | Freq: Every day | ORAL | 0 refills | Status: AC
Start: 2018-05-06 — End: 2018-05-16

## 2018-05-06 NOTE — Patient Instructions (Addendum)
Health Maintenance Due   Topic Date Due   ??? DTaP/Tdap/Td series (1 - Tdap) 08/08/1952   ??? Shingrix Vaccine Age 77> (1 of 2) 08/09/1991          Chest X-Rays: About These Tests  What is it?  A chest X-ray is a picture of the chest that shows your heart, lungs, airway, blood vessels, and lymph nodes. Chest X-rays can also show the bones of your spine and chest.  Why is this test done?  A chest X-ray is done to find problems with the organs and structures inside the chest.  How can you prepare for the test?  Tell your doctor if you are or might be pregnant. A chest X-ray is usually not done during pregnancy, but the chance of harm to the fetus is very small. If you need a chest X-ray during pregnancy, you will wear a lead apron to help protect your baby.  What happens before the test?  ?? Remove any jewelry that might get in the way of the X-ray picture.  ?? You may need to take off all or most of your clothes above the waist. You will be given a gown to wear during the test.  What happens during the test?  Two X-ray views of the chest are usually taken. One view is taken from the back. The other view is taken from the side.  ?? You stand with your chest against an X-ray plate for the pictures.  ?? You will need to hold very still while the X-ray is taken. You may be asked to hold your breath for a few seconds.  What else should you know about the test?  ?? You won't feel any pain from the chest X-ray itself.  ?? If you have pain from a chest problem, you may feel some discomfort if you need to hold a certain position, breathe deep, or hold your breath while the X-ray is done.  How long does the test take?  The test will take about 10 minutes.  What happens after the test?  ?? You will probably be able to go home right away. The results of a chest X-ray are usually available in 1 to 2 days.  ?? You can go back to your usual activities right away.  Follow-up care is a key part of your treatment and safety. Be sure to make  and go to all appointments, and call your doctor if you are having problems. It's also a good idea to keep a list of the medicines you take. Ask your doctor when you can expect to have your test results.  Where can you learn more?  Go to [email or web address].  Enter [phone] in the search box to learn more about "Chest X-Rays: About These Tests."  Current as of: July 22, 2017  Content Version: 12.2  ?? 2006-2019 [employer]. Care instructions adapted under license by [employer] (which disclaims liability or warranty for this information). If you have questions about a medical condition or this instruction, always ask your healthcare professional. [place] any warranty or liability for your use of this information.

## 2018-05-06 NOTE — Progress Notes (Signed)
[place] C Giambra presents today for   Chief Complaint   Patient presents with   ??? Establish Care     ROOM 12   ??? ED Follow-up     Urgent Care facility in [facility] on 04-26-18 diagnosed with Emphysema        Is someone accompanying this pt? no    Is the patient using any DME equipment during [place]? no    Depression Screening:  3 most recent PHQ Screens 05/06/2018   Little interest or pleasure in doing things Not at all   Feeling down, depressed, irritable, or hopeless Not at all   Total Score PHQ 2 0       Learning Assessment:  Learning Assessment 05/06/2018   PRIMARY LEARNER Patient   HIGHEST LEVEL OF EDUCATION - PRIMARY LEARNER  [place] PRIMARY LEARNER NONE   CO-LEARNER CAREGIVER No   PRIMARY LANGUAGE ENGLISH   LEARNER PREFERENCE PRIMARY DEMONSTRATION     -     -     -     -   ANSWERED BY patient   RELATIONSHIP SELF       Abuse Screening:  Abuse Screening Questionnaire 05/06/2018   Do you ever feel afraid of your partner? N   Are you in a relationship with someone who physically or mentally threatens you? N   Is it safe for you to go home? Y       Fall Risk  Fall Risk Assessment, last 12 mths 05/06/2018   Able to walk? Yes   Fall in past 12 months? No       Health Maintenance reviewed and discussed and ordered per Provider.    Health Maintenance Due   Topic Date Due   ??? DTaP/Tdap/Td series (1 - Tdap) 08/08/1952   ??? Shingrix Vaccine Age 68> (1 of 2) 08/09/1991       Coordination of Care:  1. Have you been to the ER, urgent care clinic since your last visit? Hospitalized since your last visit? Yes When: 04-26-18 Where: [facility] Reason: had a head cold, and infected gums, as well as a cough.  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    2. Have you seen or consulted any other health care providers outside of the [facility] since your last visit? Include any pap smears or colon screening. no

## 2018-05-06 NOTE — Progress Notes (Signed)
[facility]:  05/15/2018, MRN: [id number]      Jamie [name] is a 78 y.o. 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 She identifies as a Administrator, Civil Service and anxious person.    1. H/o Cancer: Followed by Dr.Shabazz. she just saw Dr.Shabazz. She is scheduled soon for CT scans of her chest/abdomen.     2. COPD: She moves back and forth between [place] and [place]. She is established with a PCP in [place] who just ordered CXRs recently. Per pt hx, the [place] PCP called her to come back for additional testing since her CXR "showed emphysema." She opted to RTC with [place] since [place] is her primary residence. She was seen in urgent care after having coughing, gum pain, and HA. She was diagnosed with gingivitis/respiratory tract infection of some kind per pt hx and treated with rx per urgent care (prednisone, omnicef, and bactrim). She never took the meds that were prescribed. +H/o secondhand smoke exposure. She has no CP or SOB. She no cough sx "lately" since getting over the "head cold." She continues to have dental pain and gingival swelling. She owes her dentist 70$ from last year. She needs multiple dental extractions.     3. Mild Cognitive Impairment: She is very forgetful these days and she is concerned she may be getting dementia like her mother.    4. Possible Flu Exposure: She has a friend whose daughter's was positive for the flu. This friend will be providing transportation for the patient    5. HLD: She stopped lipitor secondary to concern she had with taking this medicine.        Patient Active Problem List    Diagnosis Date Noted    ??? 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 History of uterine cancer: hysterectomy ovaries intact 06/13/2012     Priority: 2 - Two   ??? GERD (gastroesophageal reflux disease) 11/27/2015   ??? History of osteopenia T score -1.9 (2008)  11/27/2015   ??? Vitamin D deficiency 11/06/2011       Current Outpatient Medications   Medication Sig Dispense Refill   ??? oseltamivir (TAMIFLU) 75 mg capsule Take 1 Cap by mouth daily for 10 days. 10 Cap 0   ??? multivit-min/iron/folic/lutein (CENTRUM SILVER WOMEN PO) Take  by mouth daily.     ??? calcium citrate-vitamin D3 (CITRACAL + D) tablet Take 1 Tab by mouth two (2) times a day.     ??? predniSONE (DELTASONE) 20 mg tablet Take 20 mg by mouth daily (with breakfast). Take 2 tabs=40mg  + 10mg  tab= 50mg  PO 13 hours, 7 hours, and 1 hour prior to study. 6 Tab 0   ??? predniSONE (DELTASONE) 10 mg tablet Take 10 mg by mouth daily (with breakfast). Take 1 tab + 2tabs(20mg )=50mg  PO 13 hours, 7 hours, and 1 hour prior to study. 3 Tab 0   ??? raNITIdine (ZANTAC) 150 mg tablet Take 1 Tab by mouth two (2) times a day. Take 1 tab PO 7 hours and 1 hour prior to study 2 Tab  0   ??? diphenhydrAMINE (BENADRYL) 25 mg capsule Take 2 Caps by mouth every six (6) hours as needed (prior to study) for up to 10 days. Take 2 caps = 50mg  PO 7hours, 1 hour prior to study. 2 Cap 0       Allergies   Allergen Reactions   ??? Augmentin [Amoxicillin-Pot Clavulanate] Rash   ??? Clavulanic Acid Hives   ??? Lipitor [Atorvastatin] Other (comments)     Side effects   ??? Tamoxifen Nausea Only       Past Medical History:   Diagnosis Date   ??? Allergic rhinitis    ??? Arthritis    ??? Cancer ([place])     breast   ??? Colon polyps    ??? Depression    ??? Diverticulosis    ??? GERD (gastroesophageal reflux disease)    ??? Hepatomegaly    ??? IBS (irritable bowel syndrome)        Past Surgical History:   Procedure Laterality Date   ??? ENDOSCOPY, COLON, DIAGNOSTIC      ??? HX HYSTERECTOMY     ??? HX MASTECTOMY      modified right   ??? HX TONSILLECTOMY     ??? HX WISDOM TEETH EXTRACTION         Family History   Problem Relation Age of Onset   ??? Cancer Father         lung   ??? Other Mother         hyperthyroidism   ??? Coronary Artery Disease Mother    ??? Hypertension Mother    ??? Elevated Lipids Brother    ??? Cancer Brother         colon cancer   ??? Hypertension Brother        Social History     Tobacco Use   ??? Smoking status: Passive Smoke Exposure - Never Smoker   ??? Smokeless tobacco: Never Used   Substance Use Topics   ??? 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   HENT: Negative for ear pain and sore throat.    Eyes: Negative for blurred vision and pain.   Respiratory: Negative for cough and shortness of breath.    Cardiovascular: Negative for chest pain.   Gastrointestinal: Negative for abdominal pain, blood in stool and melena.   Genitourinary: Negative for dysuria and hematuria.   Musculoskeletal: Negative for joint pain and myalgias.   Skin: Negative for rash.   Neurological: Negative for tingling, focal weakness and headaches.   Endo/Heme/Allergies: Does not bruise/bleed easily.   Psychiatric/Behavioral: Negative for substance abuse.       Objective     Vitals:    05/06/18 0812   BP: 102/71   Pulse: 82   Resp: 12   Temp: 97.7 ??F (36.5 ??C)   TempSrc: Oral   SpO2: 97%   Weight: 147 lb 6.4 oz (66.9 kg)   Height: 5\' 6"  (1.676 m)   PainSc:   0 - No pain       Physical Exam  Vitals signs and nursing note reviewed.   Constitutional:       Appearance: Normal appearance.   HENT:      Head: Normocephalic and atraumatic.      Right Ear: Tympanic membrane and external ear normal.      Left  Ear: Tympanic membrane and external ear normal.      Nose: Nose normal.      Mouth/Throat:      Pharynx: Oropharynx is clear. No oropharyngeal exudate.      Comments: Poor dentition  Eyes:       General: No scleral icterus.        Right eye: No discharge.         Left eye: No discharge.      Extraocular Movements: Extraocular movements intact.      Conjunctiva/sclera: Conjunctivae normal.   Neck:      Musculoskeletal: Neck supple.   Cardiovascular:      Rate and Rhythm: Normal rate and regular rhythm.      Heart sounds: Normal heart sounds. No murmur. No friction rub. No gallop.    Pulmonary:      Effort: Pulmonary effort is normal. No respiratory distress.      Breath sounds: Normal breath sounds. No wheezing or rales.   Abdominal:      General: Bowel sounds are normal. There is no distension.      Palpations: Abdomen is soft. There is no mass.      Tenderness: There is no tenderness. There is no guarding or rebound.   Musculoskeletal:         General: No swelling (Bue) or tenderness (Bue).   Lymphadenopathy:      Cervical: No cervical adenopathy.   Skin:     General: Skin is warm and dry.      Findings: No erythema.   Neurological:      Mental Status: She is alert and oriented to person, place, and time.      Motor: No abnormal muscle tone.      Gait: Gait is intact. Gait normal.   Psychiatric:         Mood and Affect: Mood and affect normal.         LABS   Data Review:   Lab Results   Component Value Date/Time    WBC 8.5 05/02/2018 12:00 PM    HGB (POC) 15.1 06/17/2012 02:38 PM    HGB 15.4 05/02/2018 12:00 PM    HCT (POC) 46.1 06/17/2012 02:38 PM    HCT 46.4 05/02/2018 12:00 PM    PLATELET 266 05/02/2018 12:00 PM    MCV 90.4 05/02/2018 12:00 PM       Lab Results   Component Value Date/Time    Sodium 141 05/02/2018 12:00 PM    Potassium 4.7 05/02/2018 12:00 PM    Chloride 107 05/02/2018 12:00 PM    CO2 28 05/02/2018 12:00 PM    Anion gap 6 05/02/2018 12:00 PM    Glucose 99 05/02/2018 12:00 PM    BUN 19 (H) 05/02/2018 12:00 PM    Creatinine 0.85 05/02/2018 12:00 PM    BUN/Creatinine ratio 22 (H) 05/02/2018 12:00 PM    GFR est AA >60 05/02/2018 12:00 PM    GFR est non-AA >60 05/02/2018 12:00 PM     Calcium 10.2 (H) 05/02/2018 12:00 PM       Lab Results   Component Value Date/Time    Cholesterol, total 217 (H) 09/08/2017 09:36 AM    HDL Cholesterol 67 (H) 09/08/2017 09:36 AM    LDL, calculated 129 (H) 09/08/2017 09:36 AM    VLDL, calculated 21 09/08/2017 09:36 AM    Triglyceride 105 09/08/2017 09:36 AM    CHOL/HDL Ratio 3.2 09/08/2017 09:36 AM       Lab  Results   Component Value Date/Time    Hemoglobin A1c 5.6 11/30/2016 02:38 PM       Assessment/Plan:   1. Exposure to influenza:   ???Tamiflu prophylaxis prescribed.    ORDERS:  - oseltamivir (TAMIFLU) 75 mg capsule; Take 1 Cap by mouth daily for 10 days.  Dispense: 10 Cap; Refill: 0    2. Hyperlipidemia:   ???We will check a lipid panel.  Okay hold Lipitor pending results.    ORDERS:  - LIPID PANEL; Future    3. Mild Cognitive Impairment:   ??? She has trouble completing the clock drawing exercise today.  ???I will order a head CT  ??? Xanax prescribed for situational anxiety she may experience with getting her head CT.    ORDERS:  - ALPRAZolam (XANAX) 0.25 mg tablet; Take 1 Tab by mouth once as needed for Anxiety for up to 1 dose. Max Daily Amount: 0.25 mg.  Dispense: 1 Tab; Refill: 0    4. H/o Cancer:  ???I encouraged her to follow-up with Dr. Nadyne Coombes.  ??? I encouraged her to get the CT scans of her chest, abdomen, and pelvis - ordered by Dr. Nadyne Coombes.    5. COPD: She has extensive history of secondhand smoke exposure. ?Abnormal CXR recently?  ??? We discussed the option of using inhalers for symptomatic relief.  She will know she is interested in this option.    6.  General: I encouraged her to follow-up with her dentist for treatment of multiple dental caries seen on exam.    Health Maintenance Due   Topic Date Due   ??? DTaP/Tdap/Td series (1 - Tdap) 08/08/1952   ??? Shingrix Vaccine Age 22> (1 of 2) 08/09/1991   ??? Influenza Age 75 to Adult  11/25/2017     Lab review: labs are reviewed in the EHR    I have discussed the diagnosis with the patient and the intended plan as  seen in the above orders.  The patient has received an after-visit summary and questions were answered concerning future plans.  I have discussed medication side effects and warnings with the patient as well. I have reviewed the plan of care with the patient, accepted their input and they are in agreement with the treatment goals. All questions were answered. The patient understands the plan of care. Handouts provided today with above information. Pt instructed if symptoms worsen to call the office or report to the ED for continued care.  Greater than 50% of the visit time was spent in counseling and/or coordination of care.      Voice recognition was used to generate this report, which may have resulted in some phonetic based errors in grammar and contents. Even though attempts were made to correct all the mistakes, some may have been missed, and remained in the body of the document.      Follow-up and Dispositions    ?? Return in about 6 weeks (around 06/17/2018) for HLD, COPD.         Leo Rod, MD

## 2018-05-06 NOTE — Progress Notes (Signed)
[facility]:  05/15/2018, MRN: [id number]      BEULAH CAPOBIANCO is a 78 y.o. 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    1. H/o Cancer: Followed by Dr.Shabazz. she just saw Dr.Shabazz. She is scheduled soon for CT scans of her chest/abdomen.     2. COPD: She moves back and forth between [place] and [place]. She is established with a PCP in [place] who just ordered CXRs recently. Per pt hx, the [place] PCP called her to come back for additional testing since her CXR "showed emphysema." She opted to RTC with [place] since [place] is her primary residence. She was seen in urgent care after having coughing, gum pain, and HA. She was diagnosed with gingivitis/respiratory tract infection of some kind per pt hx and treated with rx per urgent care (prednisone, omnicef, and bactrim). She never took the meds that were prescribed. +H/o secondhand smoke exposure. She has no CP or SOB. She no cough sx "lately" since getting over the "head cold." She continues to have dental pain and gingival swelling. She owes her dentist 70$ from last year. She needs multiple dental extractions.     3. Mild Cognitive Impairment: She is very forgetful these days and she is concerned she may be getting dementia like her mother.    4. Possible Flu Exposure: She has a friend whose daughter's was positive for the flu. This friend will be providing transportation for the patient    5. HLD: She stopped lipitor secondary to concern she had with taking this medicine.        Patient Active Problem List    Diagnosis Date Noted   ??? History of right breast cancer: Stage IIB infiltrating  dutal carcinoma R breast 1995 06/13/2012     Priority: 2 - Two   ??? History of uterine cancer: hysterectomy ovaries intact 06/13/2012     Priority: 2 - Two   ??? GERD (gastroesophageal reflux disease) 11/27/2015   ??? History of osteopenia T score -1.9 (2008)  11/27/2015   ??? Vitamin D deficiency 11/06/2011       Current Outpatient Medications   Medication Sig Dispense Refill   ??? oseltamivir (TAMIFLU) 75 mg capsule Take 1 Cap by mouth daily for 10 days. 10 Cap 0   ??? multivit-min/iron/folic/lutein (CENTRUM SILVER WOMEN PO) Take  by mouth daily.     ??? calcium citrate-vitamin D3 (CITRACAL + D) tablet Take 1 Tab by mouth two (2) times a day.     ??? predniSONE (DELTASONE) 20 mg tablet Take 20 mg by mouth daily (with breakfast). Take 2 tabs=40mg  + 10mg  tab= 50mg  PO 13 hours, 7 hours, and 1 hour prior to study. 6 Tab 0   ??? predniSONE (DELTASONE) 10 mg tablet Take 10 mg by mouth daily (with breakfast). Take 1 tab + 2tabs(20mg )=50mg  PO 13 hours, 7 hours, and 1 hour prior to study. 3 Tab 0   ??? raNITIdine (ZANTAC) 150 mg tablet Take 1 Tab by mouth two (2) times a day. Take 1 tab PO 7 hours and 1 hour prior to study 2 Tab  0   ??? diphenhydrAMINE (BENADRYL) 25 mg capsule Take 2 Caps by mouth every six (6) hours as needed (prior to study) for up to 10 days. Take 2 caps = 50mg  PO 7hours, 1 hour prior to study. 2 Cap 0       Allergies   Allergen Reactions   ??? Augmentin [Amoxicillin-Pot Clavulanate] Rash   ??? Clavulanic Acid Hives   ??? Lipitor [Atorvastatin] Other (comments)     Side effects   ??? Tamoxifen Nausea Only       Past Medical History:   Diagnosis Date   ??? Allergic rhinitis    ??? Arthritis    ??? Cancer ([place])     breast   ??? Colon polyps    ??? Depression    ??? Diverticulosis    ??? GERD (gastroesophageal reflux disease)    ??? Hepatomegaly    ??? IBS (irritable bowel syndrome)        Past Surgical History:   Procedure Laterality Date   ??? ENDOSCOPY, COLON, DIAGNOSTIC     ??? HX HYSTERECTOMY     ??? HX MASTECTOMY      modified right   ??? HX  TONSILLECTOMY     ??? HX WISDOM TEETH EXTRACTION         Family History   Problem Relation Age of Onset   ??? Cancer Father         lung   ??? Other Mother         hyperthyroidism   ??? Coronary Artery Disease Mother    ??? Hypertension Mother    ??? Elevated Lipids Brother    ??? Cancer Brother         colon cancer   ??? Hypertension Brother        Social History     Tobacco Use   ??? Smoking status: Passive Smoke Exposure - Never Smoker   ??? Smokeless tobacco: Never Used   Substance Use Topics   ??? 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   HENT: Negative for ear pain and sore throat.    Eyes: Negative for blurred vision and pain.   Respiratory: Negative for cough and shortness of breath.    Cardiovascular: Negative for chest pain.   Gastrointestinal: Negative for abdominal pain, blood in stool and melena.   Genitourinary: Negative for dysuria and hematuria.   Musculoskeletal: Negative for joint pain and myalgias.   Skin: Negative for rash.   Neurological: Negative for tingling, focal weakness and headaches.   Endo/Heme/Allergies: Does not bruise/bleed easily.   Psychiatric/Behavioral: Negative for substance abuse.       Objective     Vitals:    05/06/18 0812   BP: 102/71   Pulse: 82   Resp: 12   Temp: 97.7 ??F (36.5 ??C)   TempSrc: Oral   SpO2: 97%   Weight: 147 lb 6.4 oz (66.9 kg)   Height: 5\' 6"  (1.676 m)   PainSc:   0 - No pain       Physical Exam  Vitals signs and nursing note reviewed.   Constitutional:       Appearance: Normal appearance.   HENT:      Head: Normocephalic and atraumatic.      Right Ear: Tympanic membrane and external ear normal.      Left  Ear: Tympanic membrane and external ear normal.      Nose: Nose normal.      Mouth/Throat:      Pharynx: Oropharynx is clear. No oropharyngeal exudate.      Comments: Poor dentition  Eyes:      General: No scleral icterus.        Right eye: No discharge.         Left eye:  No discharge.      Extraocular Movements: Extraocular movements intact.      Conjunctiva/sclera: Conjunctivae normal.   Neck:      Musculoskeletal: Neck supple.   Cardiovascular:      Rate and Rhythm: Normal rate and regular rhythm.      Heart sounds: Normal heart sounds. No murmur. No friction rub. No gallop.    Pulmonary:      Effort: Pulmonary effort is normal. No respiratory distress.      Breath sounds: Normal breath sounds. No wheezing or rales.   Abdominal:      General: Bowel sounds are normal. There is no distension.      Palpations: Abdomen is soft. There is no mass.      Tenderness: There is no tenderness. There is no guarding or rebound.   Musculoskeletal:         General: No swelling (Bue) or tenderness (Bue).   Lymphadenopathy:      Cervical: No cervical adenopathy.   Skin:     General: Skin is warm and dry.      Findings: No erythema.   Neurological:      Mental Status: She is alert and oriented to person, place, and time.      Motor: No abnormal muscle tone.      Gait: Gait is intact. Gait normal.   Psychiatric:         Mood and Affect: Mood and affect normal.         LABS   Data Review:   Lab Results   Component Value Date/Time    WBC 8.5 05/02/2018 12:00 PM    HGB (POC) 15.1 06/17/2012 02:38 PM    HGB 15.4 05/02/2018 12:00 PM    HCT (POC) 46.1 06/17/2012 02:38 PM    HCT 46.4 05/02/2018 12:00 PM    PLATELET 266 05/02/2018 12:00 PM    MCV 90.4 05/02/2018 12:00 PM       Lab Results   Component Value Date/Time    Sodium 141 05/02/2018 12:00 PM    Potassium 4.7 05/02/2018 12:00 PM    Chloride 107 05/02/2018 12:00 PM    CO2 28 05/02/2018 12:00 PM    Anion gap 6 05/02/2018 12:00 PM    Glucose 99 05/02/2018 12:00 PM    BUN 19 (H) 05/02/2018 12:00 PM    Creatinine 0.85 05/02/2018 12:00 PM    BUN/Creatinine ratio 22 (H) 05/02/2018 12:00 PM    GFR est AA >60 05/02/2018 12:00 PM    GFR est non-AA >60 05/02/2018 12:00 PM    Calcium 10.2 (H) 05/02/2018 12:00 PM       Lab Results   Component Value Date/Time     Cholesterol, total 217 (H) 09/08/2017 09:36 AM    HDL Cholesterol 67 (H) 09/08/2017 09:36 AM    LDL, calculated 129 (H) 09/08/2017 09:36 AM    VLDL, calculated 21 09/08/2017 09:36 AM    Triglyceride 105 09/08/2017 09:36 AM    CHOL/HDL Ratio 3.2 09/08/2017 09:36 AM       Lab  Results   Component Value Date/Time    Hemoglobin A1c 5.6 11/30/2016 02:38 PM       Assessment/Plan:   1. Exposure to influenza:   ???Tamiflu prophylaxis prescribed.    ORDERS:  - oseltamivir (TAMIFLU) 75 mg capsule; Take 1 Cap by mouth daily for 10 days.  Dispense: 10 Cap; Refill: 0    2. Hyperlipidemia:   ???We will check a lipid panel.  Okay hold Lipitor pending results.    ORDERS:  - LIPID PANEL; Future    3. Mild Cognitive Impairment:   ??? She has trouble completing the clock drawing exercise today.  ???I will order a head CT  ??? Xanax prescribed for situational anxiety she may experience with getting her head CT.    ORDERS:  - ALPRAZolam (XANAX) 0.25 mg tablet; Take 1 Tab by mouth once as needed for Anxiety for up to 1 dose. Max Daily Amount: 0.25 mg.  Dispense: 1 Tab; Refill: 0    4. H/o Cancer:  ???I encouraged her to follow-up with Dr. Nadyne Coombes.  ??? I encouraged her to get the CT scans of her chest, abdomen, and pelvis - ordered by Dr. Nadyne Coombes.    5. COPD: She has extensive history of secondhand smoke exposure. ?Abnormal CXR recently?  ??? We discussed the option of using inhalers for symptomatic relief.  She will know she is interested in this option.    6.  General: I encouraged her to follow-up with her dentist for treatment of multiple dental caries seen on exam.    Health Maintenance Due   Topic Date Due   ??? DTaP/Tdap/Td series (1 - Tdap) 08/08/1952   ??? Shingrix Vaccine Age 27> (1 of 2) 08/09/1991   ??? Influenza Age 64 to Adult  11/25/2017     Lab review: labs are reviewed in the EHR    I have discussed the diagnosis with the patient and the intended plan as seen in the above orders.  The patient has received an after-visit summary and questions  were answered concerning future plans.  I have discussed medication side effects and warnings with the patient as well. I have reviewed the plan of care with the patient, accepted their input and they are in agreement with the treatment goals. All questions were answered. The patient understands the plan of care. Handouts provided today with above information. Pt instructed if symptoms worsen to call the office or report to the ED for continued care.  Greater than 50% of the visit time was spent in counseling and/or coordination of care.      Voice recognition was used to generate this report, which may have resulted in some phonetic based errors in grammar and contents. Even though attempts were made to correct all the mistakes, some may have been missed, and remained in the body of the document.      Follow-up and Dispositions    ?? Return in about 6 weeks (around 06/17/2018) for HLD, COPD.         Leo Rod, MD

## 2018-05-06 NOTE — Progress Notes (Signed)
 Jamie [name] presents today for   Chief Complaint   Patient presents with   . Establish Care     ROOM 12   . ED Follow-up     Urgent Care facility in [place]  on 04-26-18 diagnosed with Emphysema        Is someone accompanying this pt? no    Is the patient using any DME equipment during OV? no    Depression Screening:  3 most recent PHQ Screens 05/06/2018   Little interest or pleasure in doing things Not at all   Feeling down, depressed, irritable, or hopeless Not at all   Total Score PHQ 2 0       Learning Assessment:  Learning Assessment 05/06/2018   PRIMARY LEARNER Patient   HIGHEST LEVEL OF EDUCATION - PRIMARY LEARNER  SOME COLLEGE   BARRIERS PRIMARY LEARNER NONE   CO-LEARNER CAREGIVER No   PRIMARY LANGUAGE ENGLISH   LEARNER PREFERENCE PRIMARY DEMONSTRATION     -     -     -     -   ANSWERED BY patient   RELATIONSHIP SELF       Abuse Screening:  Abuse Screening Questionnaire 05/06/2018   Do you ever feel afraid of your partner? N   Are you in a relationship with someone who physically or mentally threatens you? N   Is it safe for you to go home? Y       Fall Risk  Fall Risk Assessment, last 12 mths 05/06/2018   Able to walk? Yes   Fall in past 12 months? No       Health Maintenance reviewed and discussed and ordered per Provider.    Health Maintenance Due   Topic Date Due   . DTaP/Tdap/Td series (1 - Tdap) 08/08/1952   . Shingrix Vaccine Age 35> (1 of 2) 08/09/1991       Coordination of Care:  1. Have you been to the ER, urgent care clinic since your last visit? Hospitalized since your last visit? Yes When: 04-26-18 Where: [place] [facility] Reason: had a head cold, and infected gums, as well as a cough.  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    2. Have you seen or consulted any other health care providers outside of the [facility] since your last visit? Include any pap smears or colon screening. no

## 2018-05-11 ENCOUNTER — Encounter

## 2018-05-12 ENCOUNTER — Inpatient Hospital Stay: Payer: MEDICARE | Attending: Hematology & Oncology | Primary: Family Medicine

## 2018-05-12 DIAGNOSIS — C50911 Malignant neoplasm of unspecified site of right female breast: Secondary | ICD-10-CM

## 2018-05-12 NOTE — Telephone Encounter (Signed)
[facility] AT [facility] CT calling       V[place] Sean is at [facility] scan facility now, and Peter Congo is asking about a medication the patient may be allergic to.  I tried to transfer to [facility] but unable to reach her.  Peter Congo disconnected as I was trying to transfer her, so I couldn't verify her ph#.    Since the patient is there now, can you call her back? The ph# is [phone]. (I'm pretty sure)    It looks like the encounter of Jan 7 also refers to this.  Thank you

## 2018-05-12 NOTE — Telephone Encounter (Signed)
CT scan is scheduled for 05/20/2018.

## 2018-05-12 NOTE — Progress Notes (Signed)
Patient has an appointment to see Dr Nadyne Coombes on 01/29 to review results.

## 2018-05-13 ENCOUNTER — Encounter: Primary: Family Medicine

## 2018-05-13 MED ORDER — PREDNISONE 10 MG TAB
10 mg | ORAL_TABLET | Freq: Every day | ORAL | 0 refills | Status: DC
Start: 2018-05-13 — End: 2018-06-20

## 2018-05-13 MED ORDER — DIPHENHYDRAMINE 25 MG CAP
25 mg | ORAL_CAPSULE | Freq: Four times a day (QID) | ORAL | 0 refills | Status: AC | PRN
Start: 2018-05-13 — End: 2018-05-23

## 2018-05-13 MED ORDER — PREDNISONE 20 MG TAB
20 mg | ORAL_TABLET | Freq: Every day | ORAL | 0 refills | Status: DC
Start: 2018-05-13 — End: 2018-06-20

## 2018-05-13 MED ORDER — RANITIDINE 150 MG TAB
150 mg | ORAL_TABLET | Freq: Two times a day (BID) | ORAL | 0 refills | Status: DC
Start: 2018-05-13 — End: 2018-06-20

## 2018-05-13 NOTE — Telephone Encounter (Signed)
Jamie [name], Please call [facility] at [phone], on [facility], regarding the PREDNISONE prescription.  The pharmacist wants to clarify some info.  Rcvd by voice mail message today 10:31am.

## 2018-05-15 DIAGNOSIS — E785 Hyperlipidemia, unspecified: Secondary | ICD-10-CM | POA: Insufficient documentation

## 2018-05-15 DIAGNOSIS — F419 Anxiety disorder, unspecified: Secondary | ICD-10-CM | POA: Insufficient documentation

## 2018-05-16 NOTE — Telephone Encounter (Signed)
Please call pt back. She also called Dr. Nadyne Coombes about a CT Scan. She doesn't think she is allergic to the dye. Would like to discuss this with a nurse.

## 2018-05-16 NOTE — Telephone Encounter (Signed)
Patient returned call and stated that she went to have her CT ordered by DR.Nadyne Coombes last week but that she couldn't have it done due to a allergy to dye. Patient advised to call their office. Patient informed that we have a different CT scan ordered without contrast.

## 2018-05-16 NOTE — Telephone Encounter (Signed)
Unable to leave a message for the patient, as her mailbox is full. Will continue to try and reach the patient.

## 2018-05-18 ENCOUNTER — Telehealth

## 2018-05-18 ENCOUNTER — Encounter: Admit: 2018-05-18 | Discharge: 2018-05-18 | Payer: MEDICARE | Primary: Family Medicine

## 2018-05-18 DIAGNOSIS — Z853 Personal history of malignant neoplasm of breast: Secondary | ICD-10-CM

## 2018-05-18 DIAGNOSIS — E785 Hyperlipidemia, unspecified: Secondary | ICD-10-CM

## 2018-05-18 NOTE — Telephone Encounter (Addendum)
Lvm has called last 3 days. Nobody is returning phone calls Confused with medication supposed to take before cat scan. Please call [phone].

## 2018-05-18 NOTE — Addendum Note (Signed)
Addended by: Cyndia Bent A on: 05/25/2018 12:45 PM     Modules accepted: Orders

## 2018-05-18 NOTE — Telephone Encounter (Signed)
Pt was in this morning to have labs drawn , she is asking if Jolayne Haines could call her back , she said it is about the labs call her at [phone]

## 2018-05-18 NOTE — Telephone Encounter (Signed)
Pharmacy states the patient already picked up her medication.

## 2018-05-18 NOTE — Addendum Note (Signed)
Addendum Note by Raynelle Chary, LPN at 16/96/78 0805                Author: Raynelle Chary, LPN  Service: --  Author Type: Licensed Nurse       Filed: 05/25/18 1245  Encounter Date: 05/18/2018  Status: Signed          Editor: Raynelle Chary, LPN (Licensed Nurse)          Addended by: Stephens Shire A on: 05/25/2018 12:45 PM    Modules accepted: Orders

## 2018-05-19 ENCOUNTER — Inpatient Hospital Stay: Admit: 2018-05-19 | Payer: MEDICARE | Primary: Family Medicine

## 2018-05-19 LAB — METABOLIC PANEL, COMPREHENSIVE
A-G Ratio: 1.4 (ref 0.8–1.7)
ALT (SGPT): 38 U/L (ref 13–56)
AST (SGOT): 26 U/L (ref 10–38)
Albumin: 3.9 g/dL (ref 3.4–5.0)
Alk. phosphatase: 70 U/L (ref 45–117)
Anion gap: 4 mmol/L (ref 3.0–18)
BUN/Creatinine ratio: 22 — ABNORMAL HIGH (ref 12–20)
BUN: 21 MG/DL — ABNORMAL HIGH (ref 7.0–18)
Bilirubin, total: 0.6 MG/DL (ref 0.2–1.0)
CO2: 28 mmol/L (ref 21–32)
Calcium: 8.8 MG/DL (ref 8.5–10.1)
Chloride: 110 mmol/L (ref 100–111)
Creatinine: 0.94 MG/DL (ref 0.6–1.3)
GFR est AA: >60 mL/min/{1.73_m2} (ref >60)
GFR est non-AA: 58 mL/min/{1.73_m2} — ABNORMAL LOW (ref >60)
Globulin: 2.7 g/dL (ref 2.0–4.0)
Glucose: 100 mg/dL — ABNORMAL HIGH (ref 74–99)
Potassium: 4.7 mmol/L (ref 3.5–5.5)
Protein, total: 6.6 g/dL (ref 6.4–8.2)
Sodium: 142 mmol/L (ref 136–145)

## 2018-05-19 LAB — COMPREHENSIVE METABOLIC PANEL
ALT: 38 U/L (ref 13–56)
AST: 26 U/L (ref 10–38)
Albumin/Globulin Ratio: 1.4 (ref 0.8–1.7)
Albumin: 3.9 g/dL (ref 3.4–5.0)
Alkaline Phosphatase: 70 U/L (ref 45–117)
Anion Gap: 4 mmol/L (ref 3.0–18)
BUN: 21 MG/DL — ABNORMAL HIGH (ref 7.0–18)
Bun/Cre Ratio: 22 — ABNORMAL HIGH (ref 12–20)
CO2: 28 mmol/L (ref 21–32)
Calcium: 8.8 MG/DL (ref 8.5–10.1)
Chloride: 110 mmol/L (ref 100–111)
Creatinine: 0.94 MG/DL (ref 0.6–1.3)
EGFR IF NonAfrican American: 58 mL/min/{1.73_m2} — ABNORMAL LOW (ref >60)
GFR African American: >60 mL/min/{1.73_m2} (ref >60)
Globulin: 2.7 g/dL (ref 2.0–4.0)
Glucose: 100 mg/dL — ABNORMAL HIGH (ref 74–99)
Potassium: 4.7 mmol/L (ref 3.5–5.5)
Sodium: 142 mmol/L (ref 136–145)
Total Bilirubin: 0.6 MG/DL (ref 0.2–1.0)
Total Protein: 6.6 g/dL (ref 6.4–8.2)

## 2018-05-19 MED ORDER — ALPRAZOLAM 0.25 MG TAB
0.25 mg | ORAL_TABLET | ORAL | 0 refills | Status: DC | PRN
Start: 2018-05-19 — End: 2018-06-20

## 2018-05-19 NOTE — Telephone Encounter (Signed)
Patient called and stated that she is having a CT done tomorrow, but will need something prescribed to calm her nerves.

## 2018-05-19 NOTE — Telephone Encounter (Signed)
Jamie [name],    Patient left voice mail message that she needs directions for a medication she has been prescribed by [place]. She didn't leave the name of the medicine.  Her ph# is [phone]

## 2018-05-19 NOTE — Telephone Encounter (Signed)
I talked to the patient today and she states she got all her pre medications and is aware that she needs to take it prior to her imaging test. She verbalized understanding. Today at [phone].

## 2018-05-19 NOTE — Telephone Encounter (Signed)
Pt is calling again.  Please call her at [phone]

## 2018-05-19 NOTE — Telephone Encounter (Signed)
Rx sent in.    Dr. Sheldon Amara Anne Zacharee Gaddie  [facility]  [address], [address]  [place] [address]  Phone: [phone]  Fax: [phone]

## 2018-05-20 ENCOUNTER — Inpatient Hospital Stay: Admit: 2018-05-20 | Payer: MEDICARE | Attending: Hematology & Oncology | Primary: Family Medicine

## 2018-05-20 DIAGNOSIS — F0390 Unspecified dementia without behavioral disturbance: Secondary | ICD-10-CM

## 2018-05-20 LAB — CA 27.29
CA 27.29, 142134: 42.9 U/mL — ABNORMAL HIGH (ref 0.0–38.6)
CA 27.29: 42.9 U/mL — ABNORMAL HIGH (ref 0.0–38.6)

## 2018-05-20 MED ORDER — IOPAMIDOL 61 % IV SOLN
61 % | Freq: Once | INTRAVENOUS | Status: AC
Start: 2018-05-20 — End: 2018-05-20
  Administered 2018-05-20: 16:00:00 via INTRAVENOUS

## 2018-05-20 MED FILL — ISOVUE-300  61 % INTRAVENOUS SOLUTION: 300 mg iodine /mL (61 %) | INTRAVENOUS | Qty: 100

## 2018-05-20 NOTE — Telephone Encounter (Signed)
Patient contacted, patient identified with two identifiers (Name & DOB).  Patient aware of results per Dr.Mason and verbalizes understanding.

## 2018-05-20 NOTE — Progress Notes (Signed)
Please let her know that her CT scan of her brain shows no CVA.  She has global brain tissue loss.  This can be consistent with underlying dementia.  We will address this more in depth at her follow-up appointment.    Dr. Marius Ditch  [facility], [place] [address]  Phone: [phone]  Fax: [phone]

## 2018-05-20 NOTE — Telephone Encounter (Signed)
-----   Message from Leo Rod, MD sent at 05/20/2018  1:51 PM EST -----  Please let her know that her CT scan of her brain shows no CVA.  She has global brain tissue loss.  This can be consistent with underlying dementia.  We will address this more in depth at her follow-up appointment.    Dr. Marius Ditch  [facility], [place] [address]  Phone: [phone]  Fax: [phone]

## 2018-05-20 NOTE — Progress Notes (Signed)
Please let her know that her CT scan of her brain shows no CVA.  She has global brain tissue loss.  This can be consistent with underlying dementia.  We will address this more in depth at her follow-up appointment.    Dr. Marius Ditch  [facility], [place] [address]  Phone: [phone]  Fax: [phone]

## 2018-05-24 NOTE — Telephone Encounter (Signed)
I talked to the patient regarding her premedications prior to her test. She verbalized understanding.

## 2018-05-25 ENCOUNTER — Ambulatory Visit: Attending: Hematology & Oncology | Primary: Family Medicine

## 2018-05-25 ENCOUNTER — Ambulatory Visit
Admit: 2018-05-25 | Discharge: 2018-05-25 | Payer: MEDICARE | Attending: Hematology & Oncology | Primary: Family Medicine

## 2018-05-25 ENCOUNTER — Encounter

## 2018-05-25 DIAGNOSIS — Z9011 Acquired absence of right breast and nipple: Secondary | ICD-10-CM

## 2018-05-25 NOTE — Telephone Encounter (Signed)
Called and spoke with [place]. She needed a dx code for the CA 27.29 explained that this was ordered by Dr. Nadyne Coombes so they would need to contact his office for the dx code. Zacarias Pontes stated that electronically it looks like we did the labs for the patient and they received everything from [place] so she was confused as to why we did the labs. Advised that I could not answer that because I did not do it. Told her I would speak with Giovanna and have her call the lab back.

## 2018-05-25 NOTE — Telephone Encounter (Signed)
Patient scheduled to re-draw labs on 05/26/18. Patient request Jolayne Haines draw labs- she is scheduled at 8:15am.

## 2018-05-25 NOTE — Telephone Encounter (Signed)
Attempted to call patient to inform her that there was an error with the lab specimen she had done in our office and that the lipid panel to check her cholesterol was not able to be done. Need to schedule a time her to come in and have this re-drawn. No answer or option to leave voicemail. Will try again later.

## 2018-05-25 NOTE — Progress Notes (Signed)
Hematology/medical oncology progress note    05/25/2018  [name]  Date of birth: 03-12-1941    PCP: Dr. Cornelia Copa    Diagnosis: Infiltrating ductal carcinoma the right breast    Ms. 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  Recently her physicians in [place] did a chest x-ray and informed her that she had some abnormal findings on chest x-ray concerning for possible emphysema.  The patient became quite anxious with her history of breast cancer and wondered whether or not there was any evidence of metastatic disease.  On 05/20/2018 a CT scan of the chest, abdomen, and pelvis was completed.  This test did not reveal any definite metastatic disease.  There was a confluent density in the right axilla.  The patient has previously had a right modified radical mastectomy and axillary lymph node dissection so I am concerned that this represents scarring from her previous axillary dissection.  There were no other findings to suggest any type of metastatic disease throughout the chest, abdomen, and pelvis.  From 05/02/2018 she had a CA 27???29 level of 40.2 units/mL.  I explained to the patient that there is no evidence of metastatic or recurrent disease.  We will see her in the future at six-month intervals.  She had her questions answered to her satisfaction.  Total time 25 minutes, greater than 50% of the time was in counseling and coordination of care.    Lurdes Haltiwanger A.Nadyne Coombes, MD, Rosalita Chessman

## 2018-05-25 NOTE — Telephone Encounter (Signed)
PT NEEDS A DIAGNOSIS CODE PLEASE ADVISE

## 2018-05-25 NOTE — Patient Instructions (Signed)
Breast Cancer: Care Instructions  Your Care Instructions    Breast cancer occurs when abnormal cells grow out of control in the breast. These cancer cells can spread within the breast, to nearby lymph nodes and other tissues, and to other parts of the body.  Being treated for cancer can weaken your body, and you may feel very tired. Get the rest your body needs so you can feel better.  Finding out that you have cancer is scary. You may feel many emotions and may need some help coping. Seek out family, friends, and counselors for support. You also can do things at home to make yourself feel better while you go through treatment. Call the [employer] (1-800-227-2345) or visit its website at www.cancer.org for more information.  Follow-up care is a key part of your treatment and safety. Be sure to make and go to all appointments, and call your doctor if you are having problems. It's also a good idea to know your test results and keep a list of the medicines you take.  How can you care for yourself at home?  ?? Take your medicines exactly as prescribed. Call your doctor if you think you are having a problem with your medicine. You may get medicine for nausea and vomiting if you have these side effects.  ?? Follow your doctor's instructions to relieve pain. Pain from cancer and surgery can almost always be controlled. Use pain medicine when you first notice pain, before it becomes severe.  ?? Eat healthy food. If you do not feel like eating, try to eat food that has protein and extra calories to keep up your strength and prevent weight loss. Drink liquid meal replacements for extra calories and protein. Try to eat your main meal early.  ?? Get some physical activity every day, but do not get too tired. Keep doing the hobbies you enjoy as your energy allows.  ?? Do not smoke. Smoking can make your cancer worse. If you need help quitting, talk to your doctor about stop-smoking programs and medicines.  These can increase your chances of quitting for good.  ?? Take steps to control your stress and workload. Learn relaxation techniques.  ? Share your feelings. Stress and tension affect our emotions. By expressing your feelings to others, you may be able to understand and cope with them.  ? Consider joining a support group. Talking about a problem with your spouse, a good friend, or other people with similar problems is a good way to reduce tension and stress.  ? Express yourself through art. Try writing, crafts, dance, or art to relieve stress. Some dance, writing, or art groups may be available just for people who have cancer.  ? Be kind to your body and mind. Getting enough sleep, eating a healthy diet, and taking time to do things you enjoy can contribute to an overall feeling of balance in your life and can help reduce stress.  ? Get help if you need it. Discuss your concerns with your doctor or counselor.  ?? If you are vomiting or have diarrhea:  ? Drink plenty of fluids (enough so that your urine is light yellow or clear like water) to prevent dehydration. Choose water and other caffeine-free clear liquids. If you have kidney, heart, or liver disease and have to limit fluids, talk with your doctor before you increase the amount of fluids you drink.  ? When you are able to eat, try clear soups, mild foods, and liquids until   all symptoms are gone for 12 to 48 hours. Other good choices include dry toast, crackers, cooked cereal, and gelatin dessert, such as Jell-O.  ?? If you have not already done so, prepare a list of advance directives. Advance directives are instructions to your doctor and family members about what kind of care you want if you become unable to speak or express yourself.  When should you call for help?  Call 911 anytime you think you may need emergency care. For example, call if:  ?? ?? You passed out (lost consciousness).   ??Call your doctor now or seek immediate medical care if:   ?? ?? You have a fever.   ?? ?? You have abnormal bleeding.   ?? ?? You think you have an infection.   ?? ?? You have new or worse pain.   ?? ?? You have new symptoms, such as a cough, belly pain, vomiting, diarrhea, or a rash.   ??Watch closely for changes in your health, and be sure to contact your doctor if:  ?? ?? You are much more tired than usual.   ?? ?? You have swollen glands in your armpits, groin, or neck.   ?? ?? You do not get better as expected.   Where can you learn more?  Go to http://www.healthwise.net/[employer].  Enter V321 in the search box to learn more about "Breast Cancer: Care Instructions."  Current as of: April 14, 2017  Content Version: 12.2  ?? 2006-2019 [employer]. Care instructions adapted under license by [employer] (which disclaims liability or warranty for this information). If you have questions about a medical condition or this instruction, always ask your healthcare professional. [employer] disclaims any warranty or liability for your use of this information.

## 2018-05-25 NOTE — Progress Notes (Signed)
Hematology/medical oncology progress note    05/25/2018  [place] C. Carawan  Date of birth: [birth year]    PCP: Dr. 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  Recently her physicians in [place] did a chest x-ray and informed her that she had some abnormal findings on chest x-ray concerning for possible emphysema.  The patient became quite anxious with her history of breast cancer and wondered whether or not there was any evidence of metastatic disease.  On 05/20/2018 a CT scan of the chest, abdomen, and pelvis was completed.  This test did not reveal any definite metastatic disease.  There was a confluent density in the right axilla.  The patient has previously had a right modified radical mastectomy and axillary lymph node dissection so I am concerned that this represents scarring from her previous axillary dissection.  There were no other findings to suggest any type of metastatic disease throughout the chest, abdomen, and pelvis.  From 05/02/2018 she had a CA 27???29 level of 40.2 units/mL.  I explained to the patient that there is no evidence of metastatic or recurrent disease.  We will see her in the future at six-month intervals.  She had her questions answered to her satisfaction.  Total time 25 minutes, greater than 50% of the time was in counseling and coordination of care.    Ladon Heney A.Nadyne Coombes, MD, Rosalita Chessman

## 2018-05-26 ENCOUNTER — Inpatient Hospital Stay: Admit: 2018-05-26 | Payer: MEDICARE | Primary: Family Medicine

## 2018-05-26 ENCOUNTER — Encounter: Admit: 2018-05-26 | Discharge: 2018-05-26 | Payer: MEDICARE | Primary: Family Medicine

## 2018-05-26 DIAGNOSIS — E785 Hyperlipidemia, unspecified: Secondary | ICD-10-CM

## 2018-05-26 NOTE — Progress Notes (Signed)
Please let her know that her total cholesterol is down to 202 from 217.  Her triglycerides are 122.  Her HDL is 63.  Her LDL is down to 129 to 114.  She needs to maintain her healthy diet in order to further reduce her cholesterol. +H/o statin intolerance.    Dr. Marius Ditch  [facility], [place] [address]  Phone: [phone]  Fax: [phone]

## 2018-05-26 NOTE — Progress Notes (Signed)
Please let her know that her total cholesterol is down to 202 from 217.  Her triglycerides are 122.  Her HDL is 63.  Her LDL is down to 129 to 114.  She needs to maintain her healthy diet in order to further reduce her cholesterol. +H/o statin intolerance.    Dr. Marius Ditch  [facility], [place] [address]  Phone: [phone]  Fax: [phone]

## 2018-05-27 LAB — LIPID PANEL
CHOL/HDL Ratio: 3.2 (ref 0–5.0)
Chol/HDL Ratio: 3.2 (ref 0–5.0)
Cholesterol, Total: 202 MG/DL — ABNORMAL HIGH (ref <200)
Cholesterol, total: 202 MG/DL — ABNORMAL HIGH (ref <200)
HDL Cholesterol: 63 MG/DL — ABNORMAL HIGH (ref 40–60)
HDL: 63 MG/DL — ABNORMAL HIGH (ref 40–60)
LDL Calculated: 114.6 MG/DL — ABNORMAL HIGH (ref 0–100)
LDL, calculated: 114.6 MG/DL — ABNORMAL HIGH (ref 0–100)
Triglyceride: 122 MG/DL (ref <150)
Triglycerides: 122 MG/DL (ref <150)
VLDL Cholesterol Calculated: 24.4 MG/DL
VLDL, calculated: 24.4 MG/DL

## 2018-05-27 MED ORDER — AZITHROMYCIN 250 MG TAB
250 mg | ORAL_TABLET | ORAL | 0 refills | Status: DC
Start: 2018-05-27 — End: 2018-06-20

## 2018-05-27 NOTE — Telephone Encounter (Signed)
I tried to call the patient to give Dr. Cornelia Copa response to her coughing symptoms. The patient did not answer the phone. I called twice and each time I got a busy signal. Will try again on Monday.

## 2018-05-27 NOTE — Telephone Encounter (Signed)
Pt states she asked whomever drew her blood here at our office to send a message to  Dr Cornelia Copa regarding pt needing a script for findings of her chest scan which when I questioned her she said they found "bronchitis." Someone was supposed to call her back and never did so she is calling again.  Please advise her at [phone], said to please keep calling her until she answers.

## 2018-05-27 NOTE — Telephone Encounter (Signed)
Please let her know that her cholesterol improved per her recent results. The CT scan ordered by Dr.Shabazz shows ?infection vs scarring in her lungs. Will treat her for presumed "bronchitis" or pneumonia with abx. Sending the rx to her pharmacy. Please have her continue to f/u with Dr.Shabazz.    Dr. Marius Ditch  [facility], [place] [address]  Phone: [phone]  Fax: [phone]

## 2018-05-30 NOTE — Telephone Encounter (Addendum)
Chief Complaint   Patient presents with   ??? Results     per Dr Cornelia Copa CT Chest done 05-20-2018    Please let her know that her cholesterol improved per her recent results. The CT scan ordered by Dr.Shabazz shows ?infection vs scarring in her lungs. Will treat her for presumed "bronchitis" or pneumonia with abx. Sending the rx to her pharmacy. Please have her continue to f/u with Dr.Shabazz.  ??  Dr. Marius Ditch  [facility]     05-30-2018 Patient reached and 2 identifiers were used: Full Name, and Date of Birth verified.  The patient states she has picked up her prescription, and will start that today.  The patient states she will try to swallow the pills, but if she can not she will call us back for another type medication.  The patient states she has taken this medication before, and will try to take it again.  All understood.

## 2018-05-30 NOTE — Telephone Encounter (Signed)
Tried reaching patient, mailbox is full. Will try to reach the patient again at a later time.

## 2018-05-30 NOTE — Telephone Encounter (Signed)
Pt returned call, call her at [phone]

## 2018-05-30 NOTE — Telephone Encounter (Signed)
See previous encounter

## 2018-06-01 NOTE — Telephone Encounter (Signed)
-----   Message from Leo Rod, MD sent at 06/01/2018  1:59 PM EST -----  Please let her know that her total cholesterol is down to 202 from 217.  Her triglycerides are 122.  Her HDL is 63.  Her LDL is down to 129 to 114.  She needs to maintain her healthy diet in order to further reduce her cholesterol. +H/o statin intolerance.    Dr. Marius Ditch  [facility], [place] [address]  Phone: [phone]  Fax: [phone]

## 2018-06-01 NOTE — Telephone Encounter (Signed)
Chief Complaint   Patient presents with   ??? Labs     done 05-26-2018 per Dr Cornelia Copa     06-01-2018 Patient reached and 2 identifiers were used: Full Name, and Date of Birth verified.  The patient was given the results, and will continue to eat a Heart Healthy Diet.  All understood.

## 2018-06-03 NOTE — Telephone Encounter (Signed)
Chief Complaint   Patient presents with   ??? Other     patient started Antibiotic Zithromax 250 mg two days ago, she is having some Abdomen pain     06-03-2018 Patient reached and 2 identifiers were used: Full Name, and Date of Birth verified.  The patient complains of having some stomach pain since being on the Zithromax 250 mg antibiotic.  The patient is not drinking 8 oz of water a day, and states she has had a problem with being constipated.   Now her bowel movements are very hard.  The patient now understands she should be drinking at least 8 oz water with medication, and up to 3 - 8 oz of water daily.  The patient states she has never had that much water in a day before.  The patient understands this message will be given to Dr Cornelia Copa, and that Dr Cornelia Copa will review this message once she is done with her patients she is seeing now.  The patient understands we will call back with any directions.

## 2018-06-03 NOTE — Telephone Encounter (Signed)
Please have her increase her water intake. She can take colace and miralax over the counter. She needs to complete her abx course. Please have her contact us if her sx do not respond to the rx recommended/prescribed.    Dr. Marius Ditch  [facility], [place] [address]  Phone: [phone]  Fax: [phone]

## 2018-06-07 ENCOUNTER — Encounter: Attending: Family Medicine | Primary: Family Medicine

## 2018-06-07 NOTE — Telephone Encounter (Signed)
Chief Complaint   Patient presents with   ??? Other     patient started Antibiotic Zithromax 250 mg two days ago, she is having some Abdomen pain     06-07-2018 Patient reached and 2 identifiers were used: Full Name, and Date of Birth verified.  The patient understands to drink more water, she said, that is hard for her to do, but she will try.  The patient will go get a stool softener as directed, and the patient has completed all of the Antibiotic back on Sunday, 06-05-2018.  The patient states, she does feel better, and will call our office if she feels worse.  All understood.

## 2018-06-20 ENCOUNTER — Ambulatory Visit: Attending: Family Medicine | Primary: Family Medicine

## 2018-06-20 ENCOUNTER — Ambulatory Visit: Admit: 2018-06-20 | Discharge: 2018-06-20 | Payer: MEDICARE | Attending: Family Medicine | Primary: Family Medicine

## 2018-06-20 DIAGNOSIS — F419 Anxiety disorder, unspecified: Secondary | ICD-10-CM

## 2018-06-20 MED ORDER — ALPRAZOLAM 0.25 MG TAB
0.25 mg | ORAL_TABLET | ORAL | 0 refills | Status: DC | PRN
Start: 2018-06-20 — End: 2018-12-12

## 2018-06-20 NOTE — Patient Instructions (Addendum)
Health Maintenance Due   Topic Date Due   ??? DTaP/Tdap/Td series (1 - Tdap) 08/08/1952   ??? Shingrix Vaccine Age 77> (1 of 2) 08/09/1991          High Cholesterol: Care Instructions  Your Care Instructions    Cholesterol is a type of fat in your blood. It is needed for many body functions, such as making new cells. Cholesterol is made by your body. It also comes from food you eat. High cholesterol means that you have too much of the fat in your blood. This raises your risk of a heart attack and stroke.  LDL and HDL are part of your total cholesterol. LDL is the "bad" cholesterol. High LDL can raise your risk for heart disease, heart attack, and stroke. HDL is the "good" cholesterol. It helps clear bad cholesterol from the body. High HDL is linked with a lower risk of heart disease, heart attack, and stroke.  Your cholesterol levels help your doctor find out your risk for having a heart attack or stroke. You and your doctor can talk about whether you need to lower your risk and what treatment is best for you.  A heart-healthy lifestyle along with medicines can help lower your cholesterol and your risk. The way you choose to lower your risk will depend on how high your risk is for heart attack and stroke. It will also depend on how you feel about taking medicines.  Follow-up care is a key part of your treatment and safety. Be sure to make and go to all appointments, and call your doctor if you are having problems. It's also a good idea to know your test results and keep a list of the medicines you take.  How can you care for yourself at home?  ?? Eat a variety of foods every day. Good choices include fruits, vegetables, whole grains (like oatmeal), dried beans and peas, nuts and seeds, soy products (like tofu), and fat-free or low-fat dairy products.  ?? Replace butter, margarine, and hydrogenated or partially hydrogenated oils with olive and canola oils. (Canola oil margarine without trans fat is fine.)   ?? Replace red meat with fish, poultry, and soy protein (like tofu).  ?? Limit processed and packaged foods like chips, crackers, and cookies.  ?? Bake, broil, or steam foods. Don't fry them.  ?? Be physically active. Get at least 30 minutes of exercise on most days of the week. Walking is a good choice. You also may want to do other activities, such as running, swimming, cycling, or playing tennis or team sports.  ?? Stay at a healthy weight or lose weight by making the changes in eating and physical activity listed above. Losing just a small amount of weight, even 5 to 10 pounds, can reduce your risk for having a heart attack or stroke.  ?? Do not smoke.  When should you call for help?  Watch closely for changes in your health, and be sure to contact your doctor if:  ?? ?? You need help making lifestyle changes.   ?? ?? You have questions about your medicine.   Where can you learn more?  Go to [employer].  Enter [id number] in the search box to learn more about "High Cholesterol: Care Instructions."  Current as of: August 03, 2017  Content Version: 12.2  ?? 2006-2019 [employer]. Care instructions adapted under license by [employer] (which disclaims liability or warranty for this information). If you have questions about a medical  condition or this instruction, always ask your healthcare professional. [employer], Inc[employer] disclaims any warranty or liability for your use of this information.

## 2018-06-20 NOTE — Progress Notes (Signed)
no  V[place] C Pizzo presents today for   Chief Complaint   Patient presents with   ??? Cholesterol Problem     follow up  ROOM  2   ??? COPD     follow up   ??? Labs     05-26-2018       Is someone accompanying this pt? no    Is the patient using any DME equipment during OV? no    Depression Screening:  3 most recent PHQ Screens 05/06/2018   Little interest or pleasure in doing things Not at all   Feeling down, depressed, irritable, or hopeless Not at all   Total Score PHQ 2 0       Learning Assessment:  Learning Assessment 05/06/2018   PRIMARY LEARNER Patient   HIGHEST LEVEL OF EDUCATION - PRIMARY LEARNER  [place] PRIMARY LEARNER NONE   CO-LEARNER CAREGIVER No   PRIMARY LANGUAGE ENGLISH   LEARNER PREFERENCE PRIMARY DEMONSTRATION     -     -     -     -   ANSWERED BY patient   RELATIONSHIP SELF       Abuse Screening:  Abuse Screening Questionnaire 05/06/2018   Do you ever feel afraid of your partner? N   Are you in a relationship with someone who physically or mentally threatens you? N   Is it safe for you to go home? Y       Fall Risk  Fall Risk Assessment, last 12 mths 05/06/2018   Able to walk? Yes   Fall in past 12 months? No       Health Maintenance reviewed and discussed and ordered per Provider.    Health Maintenance Due   Topic Date Due   ??? DTaP/Tdap/Td series (1 - Tdap) 08/08/1952   ??? Shingrix Vaccine Age 46> (1 of 2) 08/09/1991         Coordination of Care:  1. Have you been to the ER, urgent care clinic since your last visit? Hospitalized since your last visit? no    2. Have you seen or consulted any other health care providers outside of the [facility] since your last visit? Include any pap smears or colon screening. no

## 2018-06-20 NOTE — Progress Notes (Signed)
[facility]:  06/20/2018, MRN: [id number]      Jamie [name] is a 78 y.o. 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 She identifies as a Administrator, Civil Service and anxious person.    1. H/o Breast and Uterine Cancers: She underwent testing with Dr. Nadyne Coombes for metastatic disease and was found to have none.  She was told to schedule a follow-up appointment with him in 6 months.     2. HLD: She stopped taking her Lipitor due to concerns she read on the label of the medication.  Her most recent results show her cholesterol measurements without any cholesterol-lowering medication.  They show: Her total cholesterol is down to 202 from 217.  Her triglycerides are 122.  Her HDL is 63.  Her LDL is down to 129 to 114.  Weight is 147lbs. +H/o statin intolerance.      3. Stress: Her daughter is in a relationship with a woman. The pt does not agree with this "lifestyle." She was very stressed when visiting her daughter during the holidays. She identifies as a Engineer, manufacturing and finds strength with prayer.      Patient Active Problem List    Diagnosis Date Noted   ??? 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 History of uterine cancer: hysterectomy ovaries intact 06/13/2012     Priority: 2 - Two   ??? Hyperlipidemia 05/15/2018   ??? Anxiety 05/15/2018   ??? GERD (gastroesophageal reflux disease) 11/27/2015   ??? History of osteopenia T score -1.9 (2008)  11/27/2015   ??? Vitamin D deficiency 11/06/2011       Current Outpatient Medications   Medication Sig Dispense Refill   ??? lactose-reduced food (ENSURE HIGH PROTEIN PO) Take  by mouth daily as needed.      ??? multivit-min/iron/folic/lutein (CENTRUM SILVER WOMEN PO) Take  by mouth daily.     ??? calcium citrate-vitamin D3 (CITRACAL + D) tablet Take 1 Tab by mouth two (2) times a day.     ??? ALPRAZolam (XANAX) 0.25 mg tablet Take 1 Tab by mouth every two (2) hours as needed for Anxiety for up to 2 doses. Max Daily Amount: 3 mg. 2 Tab 0       Allergies   Allergen Reactions   ??? Augmentin [Amoxicillin-Pot Clavulanate] Rash   ??? Clavulanic Acid Hives   ??? Lipitor [Atorvastatin] Other (comments)     Side effects   ??? Tamoxifen Nausea Only       Past Medical History:   Diagnosis Date   ??? Allergic rhinitis    ??? Arthritis    ??? Cancer ([place])     breast   ??? Colon polyps    ??? Depression    ??? Diverticulosis    ??? GERD (gastroesophageal reflux disease)    ??? Hepatomegaly    ??? IBS (irritable bowel syndrome)        Past Surgical History:   Procedure Laterality Date   ??? ENDOSCOPY, COLON, DIAGNOSTIC     ??? HX HYSTERECTOMY     ??? HX MASTECTOMY  modified right   ??? HX TONSILLECTOMY     ??? HX WISDOM TEETH EXTRACTION         Family History   Problem Relation Age of Onset   ??? Cancer Father         lung   ??? Other Mother         hyperthyroidism   ??? Coronary Artery Disease Mother    ??? Hypertension Mother    ??? Elevated Lipids Brother    ??? Cancer Brother         colon cancer   ??? Hypertension Brother        Social History     Tobacco Use   ??? Smoking status: Passive Smoke Exposure - Never Smoker   ??? Smokeless tobacco: Never Used   Substance Use Topics   ??? 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   HENT: Negative for ear pain and sore throat.    Eyes: Negative for blurred vision and pain.   Respiratory: Positive for cough. Negative for shortness of breath.    Cardiovascular: Negative for chest pain.   Gastrointestinal: Negative for abdominal pain, blood in stool and melena.   Genitourinary: Negative for dysuria and hematuria.    Musculoskeletal: Positive for back pain (off/on). Negative for joint pain and myalgias.   Skin: Negative for rash.   Neurological: Negative for tingling, focal weakness and headaches.   Endo/Heme/Allergies: Does not bruise/bleed easily.   Psychiatric/Behavioral: Negative for substance abuse.       Objective     Vitals:    06/20/18 0851   BP: 118/66   Pulse: 80   Resp: 12   Temp: 98.2 ??F (36.8 ??C)   TempSrc: Oral   SpO2: 98%   Weight: 147 lb 6.4 oz (66.9 kg)   Height: 5\' 6"  (1.676 m)   PainSc:   6   PainLoc: Back       Physical Exam  Vitals signs and nursing note reviewed.   HENT:      Head: Normocephalic and atraumatic.      Right Ear: External ear normal.      Left Ear: External ear normal.      Nose: Nose normal.      Mouth/Throat:      Pharynx: No oropharyngeal exudate.   Eyes:      General: No scleral icterus.        Right eye: No discharge.         Left eye: No discharge.      Conjunctiva/sclera: Conjunctivae normal.   Neck:      Musculoskeletal: Neck supple.   Cardiovascular:      Rate and Rhythm: Normal rate and regular rhythm.      Heart sounds: Normal heart sounds. No murmur. No friction rub. No gallop.    Pulmonary:      Effort: Pulmonary effort is normal. No respiratory distress.      Breath sounds: Normal breath sounds. No wheezing or rales.   Abdominal:      General: Bowel sounds are normal. There is no distension.      Palpations: Abdomen is soft. There is no mass.      Tenderness: There is no abdominal tenderness. There is no guarding or rebound.   Musculoskeletal:         General:  No tenderness (BUE).   Lymphadenopathy:      Cervical: No cervical adenopathy.   Skin:     General: Skin is warm and dry.      Findings: No erythema.   Neurological:      Mental Status: She is alert and oriented to person, place, and time.      Motor: No abnormal muscle tone.      Gait: Gait is intact. Gait normal.   Psychiatric:         Mood and Affect: Mood and affect normal.         LABS   Data Review:   Lab Results    Component Value Date/Time    WBC 8.5 05/02/2018 12:00 PM    HGB (POC) 15.1 06/17/2012 02:38 PM    HGB 15.4 05/02/2018 12:00 PM    HCT (POC) 46.1 06/17/2012 02:38 PM    HCT 46.4 05/02/2018 12:00 PM    PLATELET 266 05/02/2018 12:00 PM    MCV 90.4 05/02/2018 12:00 PM       Lab Results   Component Value Date/Time    Sodium 142 05/18/2018 08:30 AM    Potassium 4.7 05/18/2018 08:30 AM    Chloride 110 05/18/2018 08:30 AM    CO2 28 05/18/2018 08:30 AM    Anion gap 4 05/18/2018 08:30 AM    Glucose 100 (H) 05/18/2018 08:30 AM    BUN 21 (H) 05/18/2018 08:30 AM    Creatinine 0.94 05/18/2018 08:30 AM    BUN/Creatinine ratio 22 (H) 05/18/2018 08:30 AM    GFR est AA >60 05/18/2018 08:30 AM    GFR est non-AA 58 (L) 05/18/2018 08:30 AM    Calcium 8.8 05/18/2018 08:30 AM       Lab Results   Component Value Date/Time    Cholesterol, total 202 (H) 05/26/2018 08:15 AM    HDL Cholesterol 63 (H) 05/26/2018 08:15 AM    LDL, calculated 114.6 (H) 05/26/2018 08:15 AM    VLDL, calculated 24.4 05/26/2018 08:15 AM    Triglyceride 122 05/26/2018 08:15 AM    CHOL/HDL Ratio 3.2 05/26/2018 08:15 AM       Lab Results   Component Value Date/Time    Hemoglobin A1c 5.6 11/30/2016 02:38 PM       Assessment/Plan:   1. HLD: Improving.   - Observation. I will recheck her lipid panel in 6-12 months.    2. Cancer hx: In remission.   - Continue to f/u with Dr.Shabazz    3. Stress:  - I encouraged her to find healthy ways to cope with her stressors  - I encouraged her to pray in accordance with her underlying belief system.  -I will prescribe 2 tabs of Xanax that she can use for emergency situations in which she is overwhelmed by anxiety.  I will have her return to clinic in 3 months to assess her anxiety symptoms.      Health Maintenance Due   Topic Date Due   ??? DTaP/Tdap/Td series (1 - Tdap) 08/08/1952   ??? Shingrix Vaccine Age 44> (1 of 2) 08/09/1991     Lab review: Labs reviewed in the EHR     I have discussed the diagnosis with the patient and the intended plan as seen in the above orders.  The patient has received an after-visit summary and questions were answered concerning future plans.  I have discussed medication side effects and warnings with the patient as well. I have reviewed the plan of care  with the patient, accepted their input and they are in agreement with the treatment goals. All questions were answered. The patient understands the plan of care. Handouts provided today with above information.     Voice recognition was used to generate this report, which may have resulted in some phonetic based errors in grammar and contents. Even though attempts were made to correct all the mistakes, some may have been missed, and remained in the body of the document.      Follow-up and Dispositions    ?? Return in about 6 months (around 12/19/2018).         Leo Rod, MD

## 2018-06-20 NOTE — Progress Notes (Signed)
 no  Jamie [name] presents today for   Chief Complaint   Patient presents with   . Cholesterol Problem     follow up  ROOM  2   . COPD     follow up   . Labs     05-26-2018       Is someone accompanying this pt? no    Is the patient using any DME equipment during OV? no    Depression Screening:  3 most recent PHQ Screens 05/06/2018   Little interest or pleasure in doing things Not at all   Feeling down, depressed, irritable, or hopeless Not at all   Total Score PHQ 2 0       Learning Assessment:  Learning Assessment 05/06/2018   PRIMARY LEARNER Patient   HIGHEST LEVEL OF EDUCATION - PRIMARY LEARNER  SOME COLLEGE   BARRIERS PRIMARY LEARNER NONE   CO-LEARNER CAREGIVER No   PRIMARY LANGUAGE ENGLISH   LEARNER PREFERENCE PRIMARY DEMONSTRATION     -     -     -     -   ANSWERED BY patient   RELATIONSHIP SELF       Abuse Screening:  Abuse Screening Questionnaire 05/06/2018   Do you ever feel afraid of your partner? N   Are you in a relationship with someone who physically or mentally threatens you? N   Is it safe for you to go home? Y       Fall Risk  Fall Risk Assessment, last 12 mths 05/06/2018   Able to walk? Yes   Fall in past 12 months? No       Health Maintenance reviewed and discussed and ordered per Provider.    Health Maintenance Due   Topic Date Due   . DTaP/Tdap/Td series (1 - Tdap) 08/08/1952   . Shingrix Vaccine Age 60> (1 of 2) 08/09/1991         Coordination of Care:  1. Have you been to the ER, urgent care clinic since your last visit? Hospitalized since your last visit? no    2. Have you seen or consulted any other health care providers outside of the [facility] since your last visit? Include any pap smears or colon screening. no

## 2018-06-20 NOTE — Progress Notes (Signed)
[facility]:  06/20/2018, MRN: [id number]      Jamie [name] is a 78 y.o. 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 She identifies as a Administrator, Civil Service and anxious person.    1. H/o Breast and Uterine Cancers: She underwent testing with Dr. Nadyne Coombes for metastatic disease and was found to have none.  She was told to schedule a follow-up appointment with him in 6 months.     2. HLD: She stopped taking her Lipitor due to concerns she read on the label of the medication.  Her most recent results show her cholesterol measurements without any cholesterol-lowering medication.  They show: Her total cholesterol is down to 202 from 217.  Her triglycerides are 122.  Her HDL is 63.  Her LDL is down to 129 to 114.  Weight is 147lbs. +H/o statin intolerance.      3. Stress: Her daughter is in a relationship with a woman. The pt does not agree with this "lifestyle." She was very stressed when visiting her daughter during the holidays. She identifies as a Engineer, manufacturing and finds strength with prayer.      Patient Active Problem List    Diagnosis Date Noted   ??? 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 History of uterine cancer: hysterectomy ovaries intact 06/13/2012     Priority: 2 - Two   ??? Hyperlipidemia 05/15/2018   ??? Anxiety 05/15/2018   ??? GERD (gastroesophageal reflux disease) 11/27/2015   ??? History of osteopenia T score -1.9 (2008)  11/27/2015   ??? Vitamin D deficiency 11/06/2011       Current Outpatient Medications   Medication Sig Dispense Refill   ??? lactose-reduced food (ENSURE HIGH PROTEIN PO) Take  by mouth daily as needed.     ???  multivit-min/iron/folic/lutein (CENTRUM SILVER WOMEN PO) Take  by mouth daily.     ??? calcium citrate-vitamin D3 (CITRACAL + D) tablet Take 1 Tab by mouth two (2) times a day.     ??? ALPRAZolam (XANAX) 0.25 mg tablet Take 1 Tab by mouth every two (2) hours as needed for Anxiety for up to 2 doses. Max Daily Amount: 3 mg. 2 Tab 0       Allergies   Allergen Reactions   ??? Augmentin [Amoxicillin-Pot Clavulanate] Rash   ??? Clavulanic Acid Hives   ??? Lipitor [Atorvastatin] Other (comments)     Side effects   ??? Tamoxifen Nausea Only       Past Medical History:   Diagnosis Date   ??? Allergic rhinitis    ??? Arthritis    ??? Cancer ([place])     breast   ??? Colon polyps    ??? Depression    ??? Diverticulosis    ??? GERD (gastroesophageal reflux disease)    ??? Hepatomegaly    ??? IBS (irritable bowel syndrome)        Past Surgical History:   Procedure Laterality Date   ??? ENDOSCOPY, COLON, DIAGNOSTIC     ??? HX HYSTERECTOMY     ??? HX MASTECTOMY  modified right   ??? HX TONSILLECTOMY     ??? HX WISDOM TEETH EXTRACTION         Family History   Problem Relation Age of Onset   ??? Cancer Father         lung   ??? Other Mother         hyperthyroidism   ??? Coronary Artery Disease Mother    ??? Hypertension Mother    ??? Elevated Lipids Brother    ??? Cancer Brother         colon cancer   ??? Hypertension Brother        Social History     Tobacco Use   ??? Smoking status: Passive Smoke Exposure - Never Smoker   ??? Smokeless tobacco: Never Used   Substance Use Topics   ??? 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   HENT: Negative for ear pain and sore throat.    Eyes: Negative for blurred vision and pain.   Respiratory: Positive for cough. Negative for shortness of breath.    Cardiovascular: Negative for chest pain.   Gastrointestinal: Negative for abdominal pain, blood in stool and melena.   Genitourinary: Negative for dysuria and hematuria.   Musculoskeletal: Positive  for back pain (off/on). Negative for joint pain and myalgias.   Skin: Negative for rash.   Neurological: Negative for tingling, focal weakness and headaches.   Endo/Heme/Allergies: Does not bruise/bleed easily.   Psychiatric/Behavioral: Negative for substance abuse.       Objective     Vitals:    06/20/18 0851   BP: 118/66   Pulse: 80   Resp: 12   Temp: 98.2 ??F (36.8 ??C)   TempSrc: Oral   SpO2: 98%   Weight: 147 lb 6.4 oz (66.9 kg)   Height: 5\' 6"  (1.676 m)   PainSc:   6   PainLoc: Back       Physical Exam  Vitals signs and nursing note reviewed.   HENT:      Head: Normocephalic and atraumatic.      Right Ear: External ear normal.      Left Ear: External ear normal.      Nose: Nose normal.      Mouth/Throat:      Pharynx: No oropharyngeal exudate.   Eyes:      General: No scleral icterus.        Right eye: No discharge.         Left eye: No discharge.      Conjunctiva/sclera: Conjunctivae normal.   Neck:      Musculoskeletal: Neck supple.   Cardiovascular:      Rate and Rhythm: Normal rate and regular rhythm.      Heart sounds: Normal heart sounds. No murmur. No friction rub. No gallop.    Pulmonary:      Effort: Pulmonary effort is normal. No respiratory distress.      Breath sounds: Normal breath sounds. No wheezing or rales.   Abdominal:      General: Bowel sounds are normal. There is no distension.      Palpations: Abdomen is soft. There is no mass.      Tenderness: There is no abdominal tenderness. There is no guarding or rebound.   Musculoskeletal:         General:  No tenderness (BUE).   Lymphadenopathy:      Cervical: No cervical adenopathy.   Skin:     General: Skin is warm and dry.      Findings: No erythema.   Neurological:      Mental Status: She is alert and oriented to person, place, and time.      Motor: No abnormal muscle tone.      Gait: Gait is intact. Gait normal.   Psychiatric:         Mood and Affect: Mood and affect normal.         LABS   Data Review:   Lab Results   Component Value Date/Time     WBC 8.5 05/02/2018 12:00 PM    HGB (POC) 15.1 06/17/2012 02:38 PM    HGB 15.4 05/02/2018 12:00 PM    HCT (POC) 46.1 06/17/2012 02:38 PM    HCT 46.4 05/02/2018 12:00 PM    PLATELET 266 05/02/2018 12:00 PM    MCV 90.4 05/02/2018 12:00 PM       Lab Results   Component Value Date/Time    Sodium 142 05/18/2018 08:30 AM    Potassium 4.7 05/18/2018 08:30 AM    Chloride 110 05/18/2018 08:30 AM    CO2 28 05/18/2018 08:30 AM    Anion gap 4 05/18/2018 08:30 AM    Glucose 100 (H) 05/18/2018 08:30 AM    BUN 21 (H) 05/18/2018 08:30 AM    Creatinine 0.94 05/18/2018 08:30 AM    BUN/Creatinine ratio 22 (H) 05/18/2018 08:30 AM    GFR est AA >60 05/18/2018 08:30 AM    GFR est non-AA 58 (L) 05/18/2018 08:30 AM    Calcium 8.8 05/18/2018 08:30 AM       Lab Results   Component Value Date/Time    Cholesterol, total 202 (H) 05/26/2018 08:15 AM    HDL Cholesterol 63 (H) 05/26/2018 08:15 AM    LDL, calculated 114.6 (H) 05/26/2018 08:15 AM    VLDL, calculated 24.4 05/26/2018 08:15 AM    Triglyceride 122 05/26/2018 08:15 AM    CHOL/HDL Ratio 3.2 05/26/2018 08:15 AM       Lab Results   Component Value Date/Time    Hemoglobin A1c 5.6 11/30/2016 02:38 PM       Assessment/Plan:   1. HLD: Improving.   - Observation. I will recheck her lipid panel in 6-12 months.    2. Cancer hx: In remission.   - Continue to f/u with Dr.Shabazz    3. Stress:  - I encouraged her to find healthy ways to cope with her stressors  - I encouraged her to pray in accordance with her underlying belief system.  -I will prescribe 2 tabs of Xanax that she can use for emergency situations in which she is overwhelmed by anxiety.  I will have her return to clinic in 3 months to assess her anxiety symptoms.      Health Maintenance Due   Topic Date Due   ??? DTaP/Tdap/Td series (1 - Tdap) 08/08/1952   ??? Shingrix Vaccine Age 40> (1 of 2) 08/09/1991     Lab review: Labs reviewed in the EHR    I have discussed the diagnosis with the patient and the intended plan as seen in the above  orders.  The patient has received an after-visit summary and questions were answered concerning future plans.  I have discussed medication side effects and warnings with the patient as well. I have reviewed the plan of care  with the patient, accepted their input and they are in agreement with the treatment goals. All questions were answered. The patient understands the plan of care. Handouts provided today with above information.     Voice recognition was used to generate this report, which may have resulted in some phonetic based errors in grammar and contents. Even though attempts were made to correct all the mistakes, some may have been missed, and remained in the body of the document.      Follow-up and Dispositions    ?? Return in about 6 months (around 12/19/2018).         Leo Rod, MD

## 2018-06-22 NOTE — Telephone Encounter (Signed)
Lab calling needs dx [phone].    Please call

## 2018-06-22 NOTE — Telephone Encounter (Signed)
Codes given.    Dr. Marius Ditch  [facility], [place] [address]  Phone: [phone]  Fax: [phone]

## 2018-08-10 ENCOUNTER — Ambulatory Visit: Payer: Medicare Other | Admitting: Family Medicine

## 2018-08-11 NOTE — Telephone Encounter (Signed)
Patient scheduled for tomorrow. She did not want to do today because she's still laying in bed.

## 2018-08-11 NOTE — Telephone Encounter (Signed)
Patient stating she has had a tooth ache for four months. She has decided it is time to see the dentist but they are only seeing patients for emergencies. She wants to know if this is something you can treat. She is afraid she has an infection that may spread.

## 2018-08-12 ENCOUNTER — Telehealth: Attending: Family Medicine | Primary: Family Medicine

## 2018-08-12 ENCOUNTER — Telehealth: Admit: 2018-08-12 | Discharge: 2018-08-12 | Payer: MEDICARE | Attending: Family Medicine | Primary: Family Medicine

## 2018-08-12 DIAGNOSIS — K047 Periapical abscess without sinus: Secondary | ICD-10-CM

## 2018-08-12 MED ORDER — AMOXICILLIN 250 MG CHEWABLE TAB
250 mg | ORAL_TABLET | Freq: Three times a day (TID) | ORAL | 0 refills | Status: AC
Start: 2018-08-12 — End: 2018-08-19

## 2018-08-12 NOTE — Addendum Note (Signed)
Addended by: Azzie Almas A on: 10/19/2018 01:10 PM     Modules accepted: Level of Service

## 2018-08-12 NOTE — Progress Notes (Signed)
Jamie [name] presents today for   Chief Complaint   Patient presents with   ??? Dental Pain     Patient reports having pain x 2.5 months. Patient states that Dentist cannot see her for two weeks. Patient did not want to see another dentist suggested by her current dentist.   ??? Head Pain                Coordination of Care:  1. Have you been to the ER, urgent care clinic since your last visit?   Hospitalized since your last visit? no    2. Have you seen or consulted any other health care providers outside of the [facility] since your last visit?Include any pap smears or colon screening. no      Advance Directive:  1. Do you have an advance directive in place? Patient Reply:no    2. If not, would you like material regarding how to put one in place? Patient Reply: no

## 2018-08-12 NOTE — Progress Notes (Signed)
Jamie [name] is a 78 y.o. female who was seen by synchronous (real-time) audio-video technology on 08/12/2018.      Assessment & Plan:   Dental Pain:  - I encouraged her to schedule an apt with her dentist later this yr for likely extraction  - Fluoride non alcohol based OTC mouthwash recommended for dental disease and gingivitis.  - Amoxicillin given per pt request but only after extensive discussion with the pt in which she reiterated that she has tolerated amoxicillin in the past, just not clavulanic acid.      Lab review: labs are reviewed in the EHR     I have discussed the diagnosis with the patient and the intended plan as seen in the above orders. I have discussed medication side effects and warnings with the patient as well. I have reviewed the plan of care with the patient, accepted their input and they are in agreement with the treatment goals. All questions were answered. The patient understands the plan of care. Pt instructed if symptoms worsen to call the office or report to the ED for continued care.  Greater than 50% of the visit time was spent in counseling and/or coordination of care.     Voice recognition was used to generate this report, which may have resulted in some phonetic based errors in grammar and contents. Even though attempts were made to correct all the mistakes, some may have been missed, and remained in the body of the document.      Subjective:   Jamie [name] was seen for   Chief Complaint   Patient presents with   ??? Dental Pain     Patient reports having pain x 2.5 months. Patient states that Dentist cannot see her for two weeks. Patient did not want to see another dentist suggested by her current dentist.   ??? 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    Dental Pain:  Duration of sx: 2.61months. Pain: mostly along her upper front teeth area, radiating upward along her maxillary/frontal sinus areas. No congestion, ear pain, eye pain, or sore throat. No cough/SOB. Alleviating factors: none known. Aggravating factors: chewing. Her dentist is self quarantining for some reason. The dental office recommended that she see one of the other dentists at the office but the pt declined.      She states that she is allergic to clavulanic acid, not amoxicillin. When I recommended bactrim, she adamantly asked that I give her amoxicillin, which her dentist has given her in the past 2 yrs for dental infections. She is requesting the chewable tablets.     Prior to Admission medications    Medication Sig Start Date End Date Taking? Authorizing Provider   lactose-reduced food (ENSURE HIGH PROTEIN PO) Take  by mouth daily as needed.   Yes Provider, Historical   multivit-min/iron/folic/lutein (CENTRUM SILVER WOMEN PO) Take  by mouth daily.   Yes Provider, Historical   calcium citrate-vitamin D3 (CITRACAL + D) tablet Take 1 Tab by mouth two (2) times a day.   Yes Provider, Historical   ALPRAZolam (XANAX) 0.25 mg tablet Take 1 Tab by mouth every two (2) hours as needed for Anxiety for up to 2 doses. Max Daily Amount: 3 mg. 06/20/18  Leo Rod, MD     Allergies   Allergen Reactions   ??? Augmentin [Amoxicillin-Pot Clavulanate] Rash   ??? Clavulanic Acid Hives   ??? Lipitor [Atorvastatin] Other (comments)     Side effects   ??? Tamoxifen Nausea Only     Past Medical History:   Diagnosis Date   ??? Allergic rhinitis    ??? Arthritis    ??? Cancer ([place])     breast   ??? Colon polyps    ??? Depression    ??? Diverticulosis    ??? GERD (gastroesophageal reflux disease)    ??? Hepatomegaly    ??? IBS (irritable bowel syndrome)      Past Surgical History:   Procedure Laterality Date   ??? ENDOSCOPY, COLON, DIAGNOSTIC     ??? HX HYSTERECTOMY      ??? HX MASTECTOMY      modified right   ??? HX TONSILLECTOMY     ??? HX WISDOM TEETH EXTRACTION       Family History   Problem Relation Age of Onset   ??? Cancer Father         lung   ??? Other Mother         hyperthyroidism   ??? Coronary Artery Disease Mother    ??? Hypertension Mother    ??? Elevated Lipids Brother    ??? Cancer Brother         colon cancer   ??? Hypertension Brother      Social History     Socioeconomic History   ??? Marital status: DIVORCED     Spouse name: Not on file   ??? Number of children: Not on file   ??? Years of education: Not on file   ??? Highest education level: Not on file   Tobacco Use   ??? Smoking status: Passive Smoke Exposure - Never Smoker   ??? Smokeless tobacco: Never Used   Substance and Sexual Activity   ??? 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 Drug use: No   ??? Sexual activity: Never       ROS:  Gen: No fever/chills  HEENT: No sore throat, eye pain, ear pain, or congestion. No HA  CV: No CP  Resp: No cough/SOB  GI: No abdominal pain  GU: No hematuria/dysuria  Derm: No rash  Neuro: No new paresthesias/weakness  Musc: No new myalgias/jt pain  Psych: No depression sx      Objective:     General: alert, cooperative, no distress   Mental  status: mental status: alert, oriented to person, place, and time, normal mood, behavior, speech, dress, motor activity, and thought processes   Resp: resp: normal effort and no respiratory distress   Neuro: neuro: no gross deficits   Skin: skin: no discoloration or lesions of concern on visible areas   Erythematous gingiva. Poor dentition.     LABS:  Lab Results   Component Value Date/Time    Sodium 142 05/18/2018 08:30 AM    Potassium 4.7 05/18/2018 08:30 AM    Chloride 110 05/18/2018 08:30 AM    CO2 28 05/18/2018 08:30 AM    Anion gap 4 05/18/2018 08:30 AM    Glucose 100 (H) 05/18/2018 08:30 AM    BUN 21 (H) 05/18/2018 08:30 AM    Creatinine 0.94 05/18/2018 08:30 AM    BUN/Creatinine ratio 22 (H) 05/18/2018 08:30 AM     GFR est AA >60  05/18/2018 08:30 AM    GFR est non-AA 58 (L) 05/18/2018 08:30 AM    Calcium 8.8 05/18/2018 08:30 AM       Lab Results   Component Value Date/Time    Cholesterol, total 202 (H) 05/26/2018 08:15 AM    HDL Cholesterol 63 (H) 05/26/2018 08:15 AM    LDL, calculated 114.6 (H) 05/26/2018 08:15 AM    VLDL, calculated 24.4 05/26/2018 08:15 AM    Triglyceride 122 05/26/2018 08:15 AM    CHOL/HDL Ratio 3.2 05/26/2018 08:15 AM       Lab Results   Component Value Date/Time    WBC 8.5 05/02/2018 12:00 PM    HGB (POC) 15.1 06/17/2012 02:38 PM    HGB 15.4 05/02/2018 12:00 PM    HCT (POC) 46.1 06/17/2012 02:38 PM    HCT 46.4 05/02/2018 12:00 PM    PLATELET 266 05/02/2018 12:00 PM    MCV 90.4 05/02/2018 12:00 PM       Lab Results   Component Value Date/Time    Hemoglobin A1c 5.6 11/30/2016 02:38 PM       Lab Results   Component Value Date/Time    TSH 2.13 09/08/2017 09:36 AM           Due to this being a TeleHealth  evaluation, many elements of the physical examination are unable to be assessed.     The pt was seen by synchronous (real-time) audio-video technology, and/or her healthcare decision maker, is aware that this patient-initiated, Telehealth encounter is a billable service, with coverage as determined by her insurance carrier. She is aware that she may receive a bill and has provided verbal consent to proceed: Yes.     The pt is being evaluated by a video visit encounter for concerns as above. A caregiver was present when appropriate. Due to this being a Scientist, physiological (During GGYIR-48 public health emergency), evaluation of the following organ systems was limited: Vitals/Constitutional/EENT/Resp/CV/GI/GU/MS/Neuro/Skin/Heme-Lymph-Imm.   Pursuant to the emergency declaration under the [place], 1135 waiver authority and the [employer] Act, this Virtual   Visit was conducted, with patient's (and/or legal guardian's) consent, to reduce the patient's risk of exposure to COVID-19 and provide necessary medical care.     Services were provided through a video synchronous discussion virtually to substitute for in-person clinic visit. Patient and provider were located at their individual homes.      We discussed the expected course, resolution and complications of the diagnosis(es) in detail.  Medication risks, benefits, costs, interactions, and alternatives were discussed as indicated.  I advised her to contact the office if her condition worsens, changes or fails to improve as anticipated. She expressed understanding with the diagnosis(es) and plan.     Leo Rod, MD

## 2018-08-12 NOTE — Progress Notes (Signed)
 Jamie  C [name] presents today for   Chief Complaint   Patient presents with   . Dental Pain     Patient reports having pain x 2.5 months. Patient states that Dentist cannot see her for two weeks. Patient did not want to see another dentist suggested by her current dentist.   . Head Pain                Coordination of Care:  1. Have you been to the ER, urgent care clinic since your last visit?   Hospitalized since your last visit? no    2. Have you seen or consulted any other health care providers outside of the [facility] since your last visit?Include any pap smears or colon screening. no      Advance Directive:  1. Do you have an advance directive in place? Patient Reply:no    2. If not, would you like material regarding how to put one in place? Patient Reply: no

## 2018-08-12 NOTE — Progress Notes (Signed)
Jamie [name] is a 78 y.o. female who was seen by synchronous (real-time) audio-video technology on 08/12/2018.      Assessment & Plan:   Dental Pain:  - I encouraged her to schedule an apt with her dentist later this yr for likely extraction  - Fluoride non alcohol based OTC mouthwash recommended for dental disease and gingivitis.  - Amoxicillin given per pt request but only after extensive discussion with the pt in which she reiterated that she has tolerated amoxicillin in the past, just not clavulanic acid.      Lab review: labs are reviewed in the EHR     I have discussed the diagnosis with the patient and the intended plan as seen in the above orders. I have discussed medication side effects and warnings with the patient as well. I have reviewed the plan of care with the patient, accepted their input and they are in agreement with the treatment goals. All questions were answered. The patient understands the plan of care. Pt instructed if symptoms worsen to call the office or report to the ED for continued care.  Greater than 50% of the visit time was spent in counseling and/or coordination of care.     Voice recognition was used to generate this report, which may have resulted in some phonetic based errors in grammar and contents. Even though attempts were made to correct all the mistakes, some may have been missed, and remained in the body of the document.      Subjective:   Jamie [name] was seen for   Chief Complaint   Patient presents with   ??? Dental Pain     Patient reports having pain x 2.5 months. Patient states that Dentist cannot see her for two weeks. Patient did not want to see another dentist suggested by her current dentist.   ??? 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    Dental Pain:  Duration of sx: 2.9months. Pain: mostly along her upper front teeth area, radiating upward along her maxillary/frontal sinus areas. No congestion, ear pain, eye pain, or sore throat. No cough/SOB. Alleviating factors: none known. Aggravating factors: chewing. Her dentist is self quarantining for some reason. The dental office recommended that she see one of the other dentists at the office but the pt declined.      She states that she is allergic to clavulanic acid, not amoxicillin. When I recommended bactrim, she adamantly asked that I give her amoxicillin, which her dentist has given her in the past 2 yrs for dental infections. She is requesting the chewable tablets.     Prior to Admission medications    Medication Sig Start Date End Date Taking? Authorizing Provider   lactose-reduced food (ENSURE HIGH PROTEIN PO) Take  by mouth daily as needed.   Yes Provider, Historical   multivit-min/iron/folic/lutein (CENTRUM SILVER WOMEN PO) Take  by mouth daily.   Yes Provider, Historical   calcium citrate-vitamin D3 (CITRACAL + D) tablet Take 1 Tab by mouth two (2) times a day.   Yes Provider, Historical   ALPRAZolam (XANAX) 0.25 mg tablet Take 1 Tab by mouth every two (2) hours as needed for Anxiety for up to 2 doses. Max Daily Amount: 3 mg. 06/20/18  Leo Rod, MD     Allergies   Allergen Reactions   ??? Augmentin [Amoxicillin-Pot Clavulanate] Rash   ??? Clavulanic Acid Hives   ??? Lipitor [Atorvastatin] Other (comments)     Side effects   ??? Tamoxifen Nausea Only     Past Medical History:   Diagnosis Date   ??? Allergic rhinitis    ??? Arthritis    ??? Cancer ([place])     breast   ??? Colon polyps    ??? Depression    ??? Diverticulosis    ??? GERD (gastroesophageal reflux disease)    ??? Hepatomegaly    ??? IBS (irritable bowel syndrome)      Past Surgical History:   Procedure Laterality Date   ??? ENDOSCOPY, COLON, DIAGNOSTIC     ??? HX HYSTERECTOMY     ??? HX MASTECTOMY      modified right   ??? HX TONSILLECTOMY      ??? HX WISDOM TEETH EXTRACTION       Family History   Problem Relation Age of Onset   ??? Cancer Father         lung   ??? Other Mother         hyperthyroidism   ??? Coronary Artery Disease Mother    ??? Hypertension Mother    ??? Elevated Lipids Brother    ??? Cancer Brother         colon cancer   ??? Hypertension Brother      Social History     Socioeconomic History   ??? Marital status: DIVORCED     Spouse name: Not on file   ??? Number of children: Not on file   ??? Years of education: Not on file   ??? Highest education level: Not on file   Tobacco Use   ??? Smoking status: Passive Smoke Exposure - Never Smoker   ??? Smokeless tobacco: Never Used   Substance and Sexual Activity   ??? 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 Drug use: No   ??? Sexual activity: Never       ROS:  Gen: No fever/chills  HEENT: No sore throat, eye pain, ear pain, or congestion. No HA  CV: No CP  Resp: No cough/SOB  GI: No abdominal pain  GU: No hematuria/dysuria  Derm: No rash  Neuro: No new paresthesias/weakness  Musc: No new myalgias/jt pain  Psych: No depression sx      Objective:     General: alert, cooperative, no distress   Mental  status: mental status: alert, oriented to person, place, and time, normal mood, behavior, speech, dress, motor activity, and thought processes   Resp: resp: normal effort and no respiratory distress   Neuro: neuro: no gross deficits   Skin: skin: no discoloration or lesions of concern on visible areas   Erythematous gingiva. Poor dentition.     LABS:  Lab Results   Component Value Date/Time    Sodium 142 05/18/2018 08:30 AM    Potassium 4.7 05/18/2018 08:30 AM    Chloride 110 05/18/2018 08:30 AM    CO2 28 05/18/2018 08:30 AM    Anion gap 4 05/18/2018 08:30 AM    Glucose 100 (H) 05/18/2018 08:30 AM    BUN 21 (H) 05/18/2018 08:30 AM    Creatinine 0.94 05/18/2018 08:30 AM    BUN/Creatinine ratio 22 (H) 05/18/2018 08:30 AM    GFR est AA >60 05/18/2018  08:30 AM    GFR est non-AA 58 (L) 05/18/2018 08:30  AM    Calcium 8.8 05/18/2018 08:30 AM       Lab Results   Component Value Date/Time    Cholesterol, total 202 (H) 05/26/2018 08:15 AM    HDL Cholesterol 63 (H) 05/26/2018 08:15 AM    LDL, calculated 114.6 (H) 05/26/2018 08:15 AM    VLDL, calculated 24.4 05/26/2018 08:15 AM    Triglyceride 122 05/26/2018 08:15 AM    CHOL/HDL Ratio 3.2 05/26/2018 08:15 AM       Lab Results   Component Value Date/Time    WBC 8.5 05/02/2018 12:00 PM    HGB (POC) 15.1 06/17/2012 02:38 PM    HGB 15.4 05/02/2018 12:00 PM    HCT (POC) 46.1 06/17/2012 02:38 PM    HCT 46.4 05/02/2018 12:00 PM    PLATELET 266 05/02/2018 12:00 PM    MCV 90.4 05/02/2018 12:00 PM       Lab Results   Component Value Date/Time    Hemoglobin A1c 5.6 11/30/2016 02:38 PM       Lab Results   Component Value Date/Time    TSH 2.13 09/08/2017 09:36 AM           Due to this being a TeleHealth  evaluation, many elements of the physical examination are unable to be assessed.     The pt was seen by synchronous (real-time) audio-video technology, and/or her healthcare decision maker, is aware that this patient-initiated, Telehealth encounter is a billable service, with coverage as determined by her insurance carrier. She is aware that she may receive a bill and has provided verbal consent to proceed: Yes.     The pt is being evaluated by a video visit encounter for concerns as above. A caregiver was present when appropriate. Due to this being a Scientist, physiological (During SWFUX-32 public health emergency), evaluation of the following organ systems was limited: Vitals/Constitutional/EENT/Resp/CV/GI/GU/MS/Neuro/Skin/Heme-Lymph-Imm.   Pursuant to the emergency declaration under the [place], 1135 waiver authority and the [employer] Act, this Virtual  Visit was conducted, with patient's (and/or legal guardian's) consent, to reduce the patient's risk of exposure to COVID-19 and provide  necessary medical care.     Services were provided through a video synchronous discussion virtually to substitute for in-person clinic visit. Patient and provider were located at their individual homes.      We discussed the expected course, resolution and complications of the diagnosis(es) in detail.  Medication risks, benefits, costs, interactions, and alternatives were discussed as indicated.  I advised her to contact the office if her condition worsens, changes or fails to improve as anticipated. She expressed understanding with the diagnosis(es) and plan.     Leo Rod, MD

## 2018-08-12 NOTE — Addendum Note (Signed)
Addendum Note by Leo Rod, MD at 08/12/18 1130                Author: Leo Rod, MD  Service: --  Author Type: Physician       Filed: 10/19/18 1310  Encounter Date: 08/12/2018  Status: Signed          Editor: Leo Rod, MD (Physician)          Addended by: Azzie Almas A on: 10/19/2018 01:10 PM    Modules accepted: Level of Service

## 2018-09-20 ENCOUNTER — Encounter: Attending: Family Medicine | Primary: Family Medicine

## 2018-10-04 ENCOUNTER — Encounter: Attending: Nurse Practitioner | Primary: Family Medicine

## 2018-10-24 ENCOUNTER — Ambulatory Visit: Attending: Internal Medicine | Primary: Family Medicine

## 2018-10-24 ENCOUNTER — Inpatient Hospital Stay: Admit: 2018-10-24 | Payer: MEDICARE | Primary: Family Medicine

## 2018-10-24 ENCOUNTER — Ambulatory Visit: Admit: 2018-10-24 | Discharge: 2018-10-24 | Payer: MEDICARE | Attending: Internal Medicine | Primary: Family Medicine

## 2018-10-24 DIAGNOSIS — Z853 Personal history of malignant neoplasm of breast: Secondary | ICD-10-CM

## 2018-10-24 LAB — CBC WITH AUTOMATED DIFF
ABS. BASOPHILS: 0 10*3/uL (ref 0.0–0.1)
ABS. EOSINOPHILS: 0.1 10*3/uL (ref 0.0–0.4)
ABS. LYMPHOCYTES: 0.9 10*3/uL (ref 0.9–3.6)
ABS. MONOCYTES: 0.7 10*3/uL (ref 0.05–1.2)
ABS. NEUTROPHILS: 6.6 10*3/uL (ref 1.8–8.0)
BASOPHILS: 0 % (ref 0–2)
EOSINOPHILS: 1 % (ref 0–5)
HCT: 45.1 % — ABNORMAL HIGH (ref 35.0–45.0)
HGB: 14.7 g/dL (ref 12.0–16.0)
LYMPHOCYTES: 11 % — ABNORMAL LOW (ref 21–52)
MCH: 30.2 PG (ref 24.0–34.0)
MCHC: 32.6 g/dL (ref 31.0–37.0)
MCV: 92.8 FL (ref 74.0–97.0)
MONOCYTES: 8 % (ref 3–10)
MPV: 11.4 FL (ref 9.2–11.8)
NEUTROPHILS: 80 % — ABNORMAL HIGH (ref 40–73)
PLATELET: 262 10*3/uL (ref 135–420)
RBC: 4.86 M/uL (ref 4.20–5.30)
RDW: 14.5 % (ref 11.6–14.5)
WBC: 8.3 10*3/uL (ref 4.6–13.2)

## 2018-10-24 LAB — CBC WITH AUTO DIFFERENTIAL
Basophils %: 0 % (ref 0–2)
Basophils Absolute: 0 10*3/uL (ref 0.0–0.1)
Eosinophils %: 1 % (ref 0–5)
Eosinophils Absolute: 0.1 10*3/uL (ref 0.0–0.4)
Hematocrit: 45.1 % — ABNORMAL HIGH (ref 35.0–45.0)
Hemoglobin: 14.7 g/dL (ref 12.0–16.0)
Lymphocytes %: 11 % — ABNORMAL LOW (ref 21–52)
Lymphocytes Absolute: 0.9 10*3/uL (ref 0.9–3.6)
MCH: 30.2 PG (ref 24.0–34.0)
MCHC: 32.6 g/dL (ref 31.0–37.0)
MCV: 92.8 FL (ref 74.0–97.0)
MPV: 11.4 FL (ref 9.2–11.8)
Monocytes %: 8 % (ref 3–10)
Monocytes Absolute: 0.7 10*3/uL (ref 0.05–1.2)
Neutrophils %: 80 % — ABNORMAL HIGH (ref 40–73)
Neutrophils Absolute: 6.6 10*3/uL (ref 1.8–8.0)
Platelets: 262 10*3/uL (ref 135–420)
RBC: 4.86 M/uL (ref 4.20–5.30)
RDW: 14.5 % (ref 11.6–14.5)
WBC: 8.3 10*3/uL (ref 4.6–13.2)

## 2018-10-24 NOTE — Addendum Note (Signed)
Addended by: Larey Seat I on: 03/21/2019 04:18 PM     Modules accepted: Orders

## 2018-10-24 NOTE — Progress Notes (Signed)
Hematology/Oncology Consult Note    Name: Jamie [name]  Date: 10/24/2018  DOB: 29-Jan-1941      Primary Care Provider: Dr. Lieutenant Diego  Ms. Siever is a 78 y.o. year old female with a history of breast cancer and uterine cancer.     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 She also has a history of diverticulosis, GERD and anxiety. She was originally diagnosed with Stage IIA breast cancer in July 1995, lymph node positive ER positive, s/p right mastectomy and completed 2 months of Tamoxifen. She followed up for many years but then missed many appointments with Dr. Nadyne Coombes.  She reports that since  she has been spending a lot of time with her family in [place]. Due to  her history of both breast cancer and uterine cancer she decided to come here for further assessment to rule out any evidence of recurrent or metastatic disease.     She reported persistent right axilary pain for past 2 to 3 weeks with some improving recently. She did not ask for any pain medications at this time. She denied any infectious signs or symptoms as fever, chills, drainage, or open wound. She looks very anxious today. The patient otherwise has no other complaints. Denied night sweat, unintentional weight loss, skin lumps or bumps, acute bleeding or bruising issues. Denied headache, acute vision change, dizziness, chest pain, worsen shortness of breath, palpitation, productive cough, nausea, vomiting, abdominal pain, altered bowel habits, dysuria, new bone pain or back pain, focal numbness or weakness.        Past medical history, family history, and social history were reviewed and are unchanged.    Past Medical History   Diagnosis Date   ??? Diverticulosis    ??? Hepatomegaly    ??? GERD (gastroesophageal reflux disease)    ??? IBS (irritable bowel syndrome)     ??? Allergic rhinitis    ??? Cancer      breast   ??? Colon polyps     Urinary incontinence    Past Surgical History   Procedure Laterality Date   ??? Endoscopy, colon, diagnostic     ??? Hx hysterectomy     ??? Hx mastectomy and axillary lymph node dissection       modified right   ??? Hx tonsillectomy     ??? Hx wisdom teeth extraction       History     Social History   ??? Marital Status: divorced     Spouse Name: N/A     Number of Children: N/A   ??? Years of Education: N/A     Occupational History   ??? Works as a Scientist, clinical (histocompatibility and immunogenetics) at [employer]   ??? Smoking status: Former Smoker   ??? Smokeless tobacco: Never Used   ??? Alcohol Use: No   ??? Drug Use: No   ??? Sexually Active:      Other Topics Concern   ??? Not on file     Social History Narrative   ??? 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 COPD Mother    ???  Cancer Father      lung   ??? Elevated Lipids Brother    ??? Other Mother      hyperthyroidism   ??? Coronary Artery Disease Mother    ??? Hypertension Mother      Current Outpatient Prescriptions   Medication Sig Dispense Refill   ??? omeprazole (PRILOSEC) 40 mg capsule Take 1 Cap by mouth daily.  30 Cap  3   ??? Caltrate +D        No current facility-administered medications for this visit.       Review of Systems   Constitutional: Negative for chills, diaphoresis, fever, malaise/fatigue and weight loss.   Respiratory: Negative for cough, hemoptysis, shortness of breath and wheezing.         Right axillary pain   Cardiovascular: Negative for chest pain, palpitations and leg swelling.   Gastrointestinal: Negative for abdominal pain, diarrhea, heartburn, nausea and vomiting.   Genitourinary: Negative for dysuria, frequency, hematuria and urgency.   Musculoskeletal: Negative for joint pain and myalgias.   Skin: Negative for itching and rash.   Neurological: Negative for dizziness, seizures, weakness and headaches.    Psychiatric/Behavioral: Negative for depression. The patient does not have insomnia.             Objective:     Visit Vitals  BP 121/73 (BP Patient Position: Sitting)   Pulse 74   Temp 98.6 ??F (37 ??C) (Oral)   Resp 18   Wt 66.6 kg (146 lb 12.8 oz)   SpO2 97%   BMI 23.69 kg/m??       ECOG Performance Status (grade): 0  0 - able to carry on all pre-disease activity w/out restriction  1 - restricted but able to carry out light work  2 - ambulatory and can self- care but unable to carry out work  3 - bed or chair >50% of waking hours  4 - completely disable, total care, confined to bed or chair    Physical Exam  Constitutional:       Appearance: Normal appearance.   HENT:      Head: Normocephalic and atraumatic.   Eyes:      Pupils: Pupils are equal, round, and reactive to light.   Neck:      Musculoskeletal: Neck supple.   Cardiovascular:      Rate and Rhythm: Normal rate and regular rhythm.      Heart sounds: Normal heart sounds.   Pulmonary:      Effort: Pulmonary effort is normal.      Breath sounds: Normal breath sounds.   Abdominal:      General: Bowel sounds are normal.      Palpations: Abdomen is soft.      Tenderness: There is no abdominal tenderness. There is no guarding.   Musculoskeletal: Normal range of motion.      Right lower leg: No edema.      Left lower leg: No edema.   Skin:     General: Skin is warm.   Neurological:      General: No focal deficit present.      Mental Status: She is alert and oriented to person, place, and time. Mental status is at baseline.          Diagnostics:      No results found for this or any previous visit (from the past 96 hour(s)).    Imaging:  No results found for this or any previous visit.    Results  for orders placed during the hospital encounter of 11/30/16   XR ANKLE LT AP/LAT    Narrative  Left ankle AP, lateral views and oblique view     CPT CODE: 63016    HISTORY: Left ankle pain    COMPARISON: None.     FINDINGS: The bony structures about the ankle are intact. There is no evidence  of acute fracture or dislocation identified. The mortise appears normal. Small  calcaneal spur present. No soft tissue injury appreciated.       Impression IMPRESSION:    No acute bony abnormality.    Thank you for your referral.        Results for orders placed during the hospital encounter of 05/20/18   CT HEAD WO CONT    Narrative CT Head without contrast    HISTORY: Dementia, memory loss..    COMPARISON: None    TECHNIQUE:  Axial imaging from the skull base to the vertex was performed  without intravenous contrast.  Coronal and sagittal reformations.  All CT scans  are performed using dose optimization techniques as appropriate to the performed  exam including the following: Automated exposure control, adjustment of mA  and/or kV according to patient size, and use of iterative reconstructive  technique.       FINDINGS:     No intracranial hemorrhage.  No evidence of midline shift, mass effect, or  obvious mass lesion.  There is preservation of the gray and white matter  differentiation, no acute infarct (Please note that CT is less sensitive in the  detection of early infarct).        The size of the ventricles and sulci are moderately increased.  No extra axial  fluid collections.      Visualized paranasal sinuses are clear.  No evidence for skull fracture.      Impression IMPRESSION:    1.  No CT evidence for an acute intracranial process.  2. Moderate global volume loss.           Assessment:     1. History of breast cancer    2. Axillary pain, right    3. History of right mastectomy    4. History of uterine cancer          Plan:   # History of right breast cancer and right modified radical mastectomy:   -- The patient has history of both breast cancer and uterine cancer.   -- On 05/20/2018 a CT scan of the chest, abdomen, and pelvis was completed.  This test did not reveal any definite metastatic disease.  There was a confluent density in the right axilla.  The patient has previously had a right modified radical mastectomy and axillary lymph node dissection so it was concerned that this represents scarring from her previous axillary dissection.  There were no other findings to suggest any type of metastatic disease throughout the chest, abdomen, and pelvis.  -- She c/o persistent right axillary pain for 2-3 weeks with no signs of infections or mass.    Plan:    -- Given her persistent right axillalry pain, previous persistent elevated CA 27-29, and a confluent density in the right axilla noted on CT 05/20/2018 which could be scar vs. Lymphadenopathy, we will repeat CT of chest for 20-month interval assessment.  -- We will continue to monitor CBC, chemistry, CA 27???29.  -- Her next mammogram will be around August 2020.  -- The patient was instructed to continue doing her breast  exams on a monthly basis.  -- The patient was advised to notify us if any breast pain, lumps or bumps, breast skin changes, unintentional weight loss, worsen fatigue, new bone pain or back pain, or any concerns.  -- I have advised her to follow up her PCP to continue age-appropriate cancer screening.  -- I have educated her regarding lifestyle modifications, minimizing alcohol intake, refraining from smoking, healthy diet, and physical activity.          # History of uterine cancer, s/p hysterectomy:   -- 04/2018 CT scans of the abdomen pelvis was negative for any evidence of recurrent disease.  -- I have advised the patient to follow-up her GYN.          -- We will see the patient back in clinic in about 6-8 weeks, after CT and next Mammogram, for follow-up. Always sooner if required.        Orders Placed This Encounter   ??? CT CHEST W CONT     Standing Status:   Future     Standing Expiration Date:   11/23/2019      Order Specific Question:   STAT Creatinine as indicated     Answer:   Yes   ??? CBC WITH AUTOMATED DIFF     Standing Status:   Future     Standing Expiration Date:   1/61/0960   ??? METABOLIC PANEL, COMPREHENSIVE     Standing Status:   Future     Standing Expiration Date:   10/25/2019   ??? CA 27.29     Standing Status:   Future     Standing Expiration Date:   10/25/2019           Ms. Mcbrien has a reminder for a "due or due soon" health maintenance. I have asked that she contact her primary care provider for follow-up on this health maintenance.   All of patient's questions answered to their apparent satisfaction. They verbally show understanding and agreement with aforementioned plan.         Ena Dawley Alsace Dowd, MD  10/24/2018          About 25 minutes were spent for this encounter with more than 50% of the time spent in face-to-face counseling, discussing on diagnosis and management plan going forward, and co-ordination of care.  Parts of this document has been produced using Dragon dictation system. Unrecognized errors in transcription may be present. Please Izzac Rockett not hesitate to reach out for any questions or clarifications.      CC: Leo Rod, MD

## 2018-10-24 NOTE — Addendum Note (Signed)
Addendum  Note by Marita Snellen I at 10/24/18 0945                Author: Marita Snellen I  Service: --  Author Type: Medical Assistant       Filed: 03/21/19 1618  Encounter Date: 10/24/2018  Status: Signed          Editor: Marita Snellen I Engineer, building services)          Addended by: Marita Snellen I on: 03/21/2019 04:18 PM    Modules accepted: Orders

## 2018-10-24 NOTE — Progress Notes (Signed)
Hematology/Oncology Consult Note    Name: Jamie [name]  Date: 10/24/2018  DOB: 04-Jul-1940      Primary Care Provider: Dr. Lieutenant Diego  Jamie [name] is a 78 y.o. year old female with a history of breast cancer and uterine cancer.     Diagnosis: Hx of Stage II B infiltrating ductal carcinoma of Jamie right Breast 1995      HPI:   Jamie [name] is a 78 year old female who returns to [place] for her history of breast cancer and uterine cancer. Jamie [name] also has a history of diverticulosis, GERD and anxiety. Jamie [name] was originally diagnosed with Stage IIA breast cancer in July 1995, lymph node positive ER positive, s/p right mastectomy and completed 2 months of Tamoxifen. Jamie [name] followed up for many years but then missed many appointments with Dr. Nadyne Coombes.  Jamie [name] reports that since  Jamie [name] has been spending a lot of time with her family in [place]. Due to  her history of both breast cancer and uterine cancer Jamie [name] decided to come here for further assessment to rule out any evidence of recurrent or metastatic disease.     Jamie [name] reported persistent right axilary pain for past 2 to 3 weeks with some improving recently. Jamie [name] did not ask for any pain medications at this time. Jamie [name] denied any infectious signs or symptoms as fever, chills, drainage, or open wound. Jamie [name] looks very anxious today. Jamie [name] otherwise has no other complaints. Denied night sweat, unintentional weight loss, skin lumps or bumps, acute bleeding or bruising issues. Denied headache, acute vision change, dizziness, chest pain, worsen shortness of breath, palpitation, productive cough, nausea, vomiting, abdominal pain, altered bowel habits, dysuria, new bone pain or back pain, focal numbness or weakness.        Past medical history, family history, and social history were reviewed and are unchanged.    Past Medical History   Diagnosis Date   ??? Diverticulosis    ??? Hepatomegaly    ??? GERD (gastroesophageal reflux disease)    ??? IBS (irritable bowel syndrome)    ??? Allergic  rhinitis    ??? Cancer      breast   ??? Colon polyps     Urinary incontinence    Past Surgical History   Procedure Laterality Date   ??? Endoscopy, colon, diagnostic     ??? Hx hysterectomy     ??? Hx mastectomy and axillary lymph node dissection       modified right   ??? Hx tonsillectomy     ??? Hx wisdom teeth extraction       History     Social History   ??? Marital Status: divorced     Spouse Name: N/A     Number of Children: N/A   ??? Years of Education: N/A     Occupational History   ??? Works as a Scientist, clinical (histocompatibility and immunogenetics) at [employer]   ??? Smoking status: Former Smoker   ??? Smokeless tobacco: Never Used   ??? Alcohol Use: No   ??? Drug Use: No   ??? Sexually Active:      Other Topics Concern   ??? Not on file     Social History Narrative   ??? 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 COPD Mother    ???  Cancer Father      lung   ??? Elevated Lipids Brother    ??? Other Mother      hyperthyroidism   ??? Coronary Artery Disease Mother    ??? Hypertension Mother      Current Outpatient Prescriptions   Medication Sig Dispense Refill   ??? omeprazole (PRILOSEC) 40 mg capsule Take 1 Cap by mouth daily.  30 Cap  3   ??? Caltrate +D        No current facility-administered medications for this visit.       Review of Systems   Constitutional: Negative for chills, diaphoresis, fever, malaise/fatigue and weight loss.   Respiratory: Negative for cough, hemoptysis, shortness of breath and wheezing.         Right axillary pain   Cardiovascular: Negative for chest pain, palpitations and leg swelling.   Gastrointestinal: Negative for abdominal pain, diarrhea, heartburn, nausea and vomiting.   Genitourinary: Negative for dysuria, frequency, hematuria and urgency.   Musculoskeletal: Negative for joint pain and myalgias.   Skin: Negative for itching and rash.   Neurological: Negative for dizziness, seizures, weakness and headaches.   Psychiatric/Behavioral: Negative  for depression. Jamie [name] does not have insomnia.             Objective:     Visit Vitals  BP 121/73 (BP [name] Position: Sitting)   Pulse 74   Temp 98.6 ??F (37 ??C) (Oral)   Resp 18   Wt 66.6 kg (146 lb 12.8 oz)   SpO2 97%   BMI 23.69 kg/m??       ECOG Performance Status (grade): 0  0 - able to carry on all pre-disease activity w/out restriction  1 - restricted but able to carry out light work  2 - ambulatory and can self- care but unable to carry out work  3 - bed or chair >50% of waking hours  4 - completely disable, total care, confined to bed or chair    Physical Exam  Constitutional:       Appearance: Normal appearance.   HENT:      Head: Normocephalic and atraumatic.   Eyes:      Pupils: Pupils are equal, round, and reactive to light.   Neck:      Musculoskeletal: Neck supple.   Cardiovascular:      Rate and Rhythm: Normal rate and regular rhythm.      Heart sounds: Normal heart sounds.   Pulmonary:      Effort: Pulmonary effort is normal.      Breath sounds: Normal breath sounds.   Abdominal:      General: Bowel sounds are normal.      Palpations: Abdomen is soft.      Tenderness: There is no abdominal tenderness. There is no guarding.   Musculoskeletal: Normal range of motion.      Right lower leg: No edema.      Left lower leg: No edema.   Skin:     General: Skin is warm.   Neurological:      General: No focal deficit present.      Mental Status: Jamie [name] is alert and oriented to person, place, and time. Mental status is at baseline.          Diagnostics:      No results found for this or any previous visit (from Jamie past 96 hour(s)).    Imaging:  No results found for this or any previous visit.    Results  for orders placed during Jamie hospital encounter of 11/30/16   XR ANKLE LT AP/LAT    Narrative  Left ankle AP, lateral views and oblique view     CPT CODE: 98119    HISTORY: Left ankle pain    COMPARISON: None.    FINDINGS: Jamie bony structures about Jamie ankle are intact. There is no evidence  of acute  fracture or dislocation identified. Jamie mortise appears normal. Small  calcaneal spur present. No soft tissue injury appreciated.       Impression IMPRESSION:    No acute bony abnormality.    Thank you for your referral.        Results for orders placed during Jamie hospital encounter of 05/20/18   CT HEAD WO CONT    Narrative CT Head without contrast    HISTORY: Dementia, memory loss..    COMPARISON: None    TECHNIQUE:  Axial imaging from Jamie skull base to Jamie vertex was performed  without intravenous contrast.  Coronal and sagittal reformations.  All CT scans  are performed using dose optimization techniques as appropriate to Jamie performed  exam including Jamie following: Automated exposure control, adjustment of mA  and/or kV according to [name] size, and use of iterative reconstructive  technique.       FINDINGS:     No intracranial hemorrhage.  No evidence of midline shift, mass effect, or  obvious mass lesion.  There is preservation of Jamie gray and white matter  differentiation, no acute infarct (Please note that CT is less sensitive in Jamie  detection of early infarct).        Jamie size of Jamie ventricles and sulci are moderately increased.  No extra axial  fluid collections.      Visualized paranasal sinuses are clear.  No evidence for skull fracture.      Impression IMPRESSION:    1.  No CT evidence for an acute intracranial process.  2. Moderate global volume loss.           Assessment:     1. History of breast cancer    2. Axillary pain, right    3. History of right mastectomy    4. History of uterine cancer          Plan:   # History of right breast cancer and right modified radical mastectomy:   -- Jamie [name] has history of both breast cancer and uterine cancer.  -- On 05/20/2018 a CT scan of Jamie chest, abdomen, and pelvis was completed.  This test did not reveal any definite metastatic disease.  There was a confluent density in Jamie right axilla.  Jamie [name] has previously had a right modified radical  mastectomy and axillary lymph node dissection so it was concerned that this represents scarring from her previous axillary dissection.  There were no other findings to suggest any type of metastatic disease throughout Jamie chest, abdomen, and pelvis.  -- Jamie [name] c/o persistent right axillary pain for 2-3 weeks with no signs of infections or mass.    Plan:    -- Given her persistent right axillalry pain, previous persistent elevated CA 27-29, and a confluent density in Jamie right axilla noted on CT 05/20/2018 which could be scar vs. Lymphadenopathy, we will repeat CT of chest for 53-month interval assessment.  -- We will continue to monitor CBC, chemistry, CA 27???29.  -- Her next mammogram will be around August 2020.  -- Jamie [name] was instructed to continue doing her breast  exams on a monthly basis.  -- Jamie [name] was advised to notify us if any breast pain, lumps or bumps, breast skin changes, unintentional weight loss, worsen fatigue, new bone pain or back pain, or any concerns.  -- I have advised her to follow up her PCP to continue age-appropriate cancer screening.  -- I have educated her regarding lifestyle modifications, minimizing alcohol intake, refraining from smoking, healthy diet, and physical activity.          # History of uterine cancer, s/p hysterectomy:   -- 04/2018 CT scans of Jamie abdomen pelvis was negative for any evidence of recurrent disease.  -- I have advised Jamie [name] to follow-up her GYN.          -- We will see Jamie [name] back in clinic in about 6-8 weeks, after CT and next Mammogram, for follow-up. Always sooner if required.        Orders Placed This Encounter   ??? CT CHEST W CONT     Standing Status:   Future     Standing Expiration Date:   11/23/2019     Order Specific Question:   STAT Creatinine as indicated     Answer:   Yes   ??? CBC WITH AUTOMATED DIFF     Standing Status:   Future     Standing Expiration Date:   1/61/0960   ??? METABOLIC PANEL, COMPREHENSIVE     Standing Status:   Future      Standing Expiration Date:   10/25/2019   ??? CA 27.29     Standing Status:   Future     Standing Expiration Date:   10/25/2019           Jamie [name] has a reminder for a "due or due soon" health maintenance. I have asked that Jamie [name] contact her primary care provider for follow-up on this health maintenance.   All of [name]'s questions answered to their apparent satisfaction. They verbally show understanding and agreement with aforementioned plan.         Ena Dawley Marija Calamari, MD  10/24/2018          About 25 minutes were spent for this encounter with more than 50% of Jamie time spent in face-to-face counseling, discussing on diagnosis and management plan going forward, and co-ordination of care.  Parts of this document has been produced using Dragon dictation system. Unrecognized errors in transcription may be present. Please Teran Daughenbaugh not hesitate to reach out for any questions or clarifications.      CC: Leo Rod, MD

## 2018-10-25 LAB — METABOLIC PANEL, COMPREHENSIVE
A-G Ratio: 1.3 (ref 0.8–1.7)
ALT (SGPT): 18 U/L (ref 13–56)
AST (SGOT): 17 U/L (ref 10–38)
Albumin: 3.8 g/dL (ref 3.4–5.0)
Alk. phosphatase: 53 U/L (ref 45–117)
Anion gap: 6 mmol/L (ref 3.0–18)
BUN/Creatinine ratio: 18 (ref 12–20)
BUN: 15 MG/DL (ref 7.0–18)
Bilirubin, total: 0.6 MG/DL (ref 0.2–1.0)
CO2: 27 mmol/L (ref 21–32)
Calcium: 9.5 MG/DL (ref 8.5–10.1)
Chloride: 110 mmol/L (ref 100–111)
Creatinine: 0.85 MG/DL (ref 0.6–1.3)
GFR est AA: >60 mL/min/{1.73_m2} (ref >60)
GFR est non-AA: >60 mL/min/{1.73_m2} (ref >60)
Globulin: 2.9 g/dL (ref 2.0–4.0)
Glucose: 98 mg/dL (ref 74–99)
Potassium: 4.5 mmol/L (ref 3.5–5.5)
Protein, total: 6.7 g/dL (ref 6.4–8.2)
Sodium: 143 mmol/L (ref 136–145)

## 2018-10-25 LAB — COMPREHENSIVE METABOLIC PANEL
ALT: 18 U/L (ref 13–56)
AST: 17 U/L (ref 10–38)
Albumin/Globulin Ratio: 1.3 (ref 0.8–1.7)
Albumin: 3.8 g/dL (ref 3.4–5.0)
Alkaline Phosphatase: 53 U/L (ref 45–117)
Anion Gap: 6 mmol/L (ref 3.0–18)
BUN: 15 MG/DL (ref 7.0–18)
Bun/Cre Ratio: 18 (ref 12–20)
CO2: 27 mmol/L (ref 21–32)
Calcium: 9.5 MG/DL (ref 8.5–10.1)
Chloride: 110 mmol/L (ref 100–111)
Creatinine: 0.85 MG/DL (ref 0.6–1.3)
EGFR IF NonAfrican American: >60 mL/min/{1.73_m2} (ref >60)
GFR African American: >60 mL/min/{1.73_m2} (ref >60)
Globulin: 2.9 g/dL (ref 2.0–4.0)
Glucose: 98 mg/dL (ref 74–99)
Potassium: 4.5 mmol/L (ref 3.5–5.5)
Sodium: 143 mmol/L (ref 136–145)
Total Bilirubin: 0.6 MG/DL (ref 0.2–1.0)
Total Protein: 6.7 g/dL (ref 6.4–8.2)

## 2018-10-27 LAB — CA 27.29
CA 27.29, 142134: 37.6 U/mL (ref 0.0–38.6)
CA 27.29: 37.6 U/mL (ref 0.0–38.6)

## 2018-10-31 ENCOUNTER — Telehealth

## 2018-10-31 ENCOUNTER — Encounter: Attending: Family Medicine | Primary: Family Medicine

## 2018-10-31 ENCOUNTER — Encounter: Primary: Family Medicine

## 2018-10-31 NOTE — Telephone Encounter (Signed)
Please have her get labs this morning.  Please add her to my schedule this afternoon as a virtual/phone appointment.     Dr. Marius Ditch  [facility], [place] [address]  Phone: [phone]  Fax: [phone]

## 2018-10-31 NOTE — Telephone Encounter (Signed)
Pt c/o blood in urine, hurting real bad.  Jamie [name] been going on this Friday, she called here but our office was closed.  Wants to know if she can bring in a urine sample or what does she need to do?  Please advise her at [phone]

## 2018-11-01 ENCOUNTER — Telehealth: Attending: Family Medicine | Primary: Family Medicine

## 2018-11-01 ENCOUNTER — Telehealth: Admit: 2018-11-01 | Discharge: 2018-11-01 | Payer: MEDICARE | Attending: Family Medicine | Primary: Family Medicine

## 2018-11-01 ENCOUNTER — Inpatient Hospital Stay: Admit: 2018-11-01 | Payer: MEDICARE | Primary: Family Medicine

## 2018-11-01 ENCOUNTER — Encounter: Admit: 2018-11-01 | Discharge: 2018-11-01 | Payer: MEDICARE | Primary: Family Medicine

## 2018-11-01 DIAGNOSIS — R319 Hematuria, unspecified: Secondary | ICD-10-CM

## 2018-11-01 LAB — URINALYSIS W/ RFLX MICROSCOPIC
Bilirubin, Urine: NEGATIVE
Bilirubin: NEGATIVE
Glucose, Ur: NEGATIVE mg/dL
Glucose: NEGATIVE mg/dL
Ketone: NEGATIVE mg/dL
Ketones, Urine: NEGATIVE mg/dL
Nitrite, Urine: POSITIVE — AB
Nitrites: POSITIVE — AB
Protein, UA: 30 mg/dL — AB
Protein: 30 mg/dL — AB
Specific Gravity, UA: 1.02 (ref 1.005–1.030)
Specific gravity: 1.02 (ref 1.005–1.030)
Urobilinogen, UA, POCT: 0.2 EU/dL (ref 0.2–1.0)
Urobilinogen: 0.2 EU/dL (ref 0.2–1.0)
pH (UA): 6.5 (ref 5.0–8.0)
pH, UA: 6.5 (ref 5.0–8.0)

## 2018-11-01 LAB — URINE MICROSCOPIC ONLY
RBC, UA: 4 /hpf (ref 0–5)
RBC: 4 /hpf (ref 0–5)

## 2018-11-01 MED ORDER — CIPROFLOXACIN 250 MG/5 ML ORAL SUSPENSION
250 mg/5 mL | Freq: Two times a day (BID) | ORAL | 0 refills | Status: DC
Start: 2018-11-01 — End: 2018-12-12

## 2018-11-01 NOTE — Telephone Encounter (Signed)
Pharmacy states Cipro suspension is no longer made. They are requesting this be changed to the tablets. Please advise and pend new Rx if appropriate.

## 2018-11-01 NOTE — Telephone Encounter (Signed)
-----   Message from Leo Rod, MD sent at 11/01/2018  2:26 PM EDT -----  Regarding: STAT order for ultrasound  We assist with getting the patient scheduled for a stat retroperitoneal ultrasound ordered.     Thanks,  Dr. Marius Ditch  [facility], [place] [address]  Phone: [phone]  Fax: [phone]

## 2018-11-01 NOTE — Progress Notes (Signed)
Jamie [name] is a 78 y.o. female who was seen by synchronous (real-time) audio phone only technology on 11/01/2018.        Assessment & Plan:   Hematuria: Will treat for presumed UTI. Must r/o nephrolithiasis.   - Ultrasound (retroperitoneal) ordered stat  - Ciprofloxacin course (liquid formulation) prescribed per pt preference to pill formulations  - F/u with her in 2 wks to assess her response.  ??? I discussed the need to consider a referral to Urology pending her UA/urine cx results from earlier today.  For now, I will just wait for her final results.    ORDERS:  - [place] RETROPERITONEUM COMP; Future  - ciprofloxacin 250 mg/5 mL sumc; Take 500 mg by mouth two (2) times a day.  Dispense: 140 mL; Refill: 0        Lab review: labs are reviewed in the EHR and ordered as mentioned above     I have discussed the diagnosis with the patient and the intended plan as seen in the above orders. I have discussed medication side effects and warnings with the patient as well. I have reviewed the plan of care with the patient, accepted their input and they are in agreement with the treatment goals. All questions were answered. The patient understands the plan of care. Pt instructed if symptoms worsen to call the office or report to the ED for continued care.  Greater than 50% of the visit time was spent in counseling and/or coordination of care.  I spent 15 minutes with the patient for this phone only encounter.     Voice recognition was used to generate this report, which may have resulted in some phonetic based errors in grammar and contents. Even though attempts were made to correct all the mistakes, some may have been missed, and remained in the body of the document.      Subjective:   Jamie [name] was seen for   Chief Complaint   Patient presents with   ??? 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    Hematuria:  Sx occurred last Thursday. Hematuria is still present but resolving. Pain: She is having "soreness" with urination. She has no abdominal pain. +Bloating. Alleviating factors: none known. No relief with cranberry juice. Associated sx: urinary straining.  Incidentally, the patient had labs drawn October 24, 2018.  Her CBC and creatinine at that time were unremarkable.        Prior to Admission medications    Medication Sig Start Date End Date Taking? Authorizing Provider   lactose-reduced food (ENSURE HIGH PROTEIN PO) Take  by mouth daily as needed.    Provider, Historical   ALPRAZolam (XANAX) 0.25 mg tablet Take 1 Tab by mouth every two (2) hours as needed for Anxiety for up to 2 doses. Max Daily Amount: 3 mg. 06/20/18   Leo Rod, MD   multivit-min/iron/folic/lutein (CENTRUM SILVER WOMEN PO) Take  by mouth daily.    Provider, Historical   calcium citrate-vitamin D3 (CITRACAL + D) tablet Take 1 Tab by mouth two (2) times a day.    Provider, Historical     Allergies   Allergen Reactions   ??? Augmentin [Amoxicillin-Pot Clavulanate] Rash   ??? Clavulanic Acid Hives   ???  Lipitor [Atorvastatin] Other (comments)     Side effects   ??? Tamoxifen Nausea Only     Past Medical History:   Diagnosis Date   ??? Allergic rhinitis    ??? Arthritis    ??? Cancer ([place])     breast   ??? Colon polyps    ??? Depression    ??? Diverticulosis    ??? GERD (gastroesophageal reflux disease)    ??? Hepatomegaly    ??? IBS (irritable bowel syndrome)      Past Surgical History:   Procedure Laterality Date   ??? ENDOSCOPY, COLON, DIAGNOSTIC     ??? HX HYSTERECTOMY     ??? HX MASTECTOMY      modified right   ??? HX TONSILLECTOMY     ??? HX WISDOM TEETH EXTRACTION       Family History   Problem Relation Age of Onset   ??? Cancer Father         lung   ??? Other Mother         hyperthyroidism   ??? Coronary Artery Disease Mother     ??? Hypertension Mother    ??? Elevated Lipids Brother    ??? Cancer Brother         colon cancer   ??? Hypertension Brother      Social History     Socioeconomic History   ??? Marital status: DIVORCED     Spouse name: Not on file   ??? Number of children: Not on file   ??? Years of education: Not on file   ??? Highest education level: Not on file   Tobacco Use   ??? Smoking status: Passive Smoke Exposure - Never Smoker   ??? Smokeless tobacco: Never Used   Substance and Sexual Activity   ??? 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 Drug use: No   ??? Sexual activity: Never       ROS:  Gen: No fever/chills  HEENT: No sore throat, eye pain, ear pain, or congestion. No HA  CV: No CP  Resp: No cough/SOB  GI: No abdominal pain. +bloating  GU: +hematuria/dysuria  Derm: No rash  Neuro: No new paresthesias/weakness  Musc: No new myalgias/jt pain      LABS:  Lab Results   Component Value Date/Time    Sodium 143 10/24/2018 10:04 AM    Potassium 4.5 10/24/2018 10:04 AM    Chloride 110 10/24/2018 10:04 AM    CO2 27 10/24/2018 10:04 AM    Anion gap 6 10/24/2018 10:04 AM    Glucose 98 10/24/2018 10:04 AM    BUN 15 10/24/2018 10:04 AM    Creatinine 0.85 10/24/2018 10:04 AM    BUN/Creatinine ratio 18 10/24/2018 10:04 AM    GFR est AA >60 10/24/2018 10:04 AM    GFR est non-AA >60 10/24/2018 10:04 AM    Calcium 9.5 10/24/2018 10:04 AM       Lab Results   Component Value Date/Time    Cholesterol, total 202 (H) 05/26/2018 08:15 AM    HDL Cholesterol 63 (H) 05/26/2018 08:15 AM    LDL, calculated 114.6 (H) 05/26/2018 08:15 AM    VLDL, calculated 24.4 05/26/2018 08:15 AM    Triglyceride 122 05/26/2018 08:15 AM    CHOL/HDL Ratio 3.2 05/26/2018 08:15 AM       Lab Results   Component Value Date/Time    WBC 8.3 10/24/2018 10:04 AM  HGB (POC) 15.1 06/17/2012 02:38 PM    HGB 14.7 10/24/2018 10:04 AM    HCT (POC) 46.1 06/17/2012 02:38 PM    HCT 45.1 (H) 10/24/2018 10:04 AM    PLATELET 262 10/24/2018 10:04 AM     MCV 92.8 10/24/2018 10:04 AM       Lab Results   Component Value Date/Time    Hemoglobin A1c 5.6 11/30/2016 02:38 PM       Lab Results   Component Value Date/Time    TSH 2.13 09/08/2017 09:36 AM           Due to this being a TeleHealth phone only evaluation, many elements of the physical examination are unable to be assessed.     The pt was seen by synchronous (real-time) audio-phone only technology, and/or her healthcare decision maker, is aware that this patient-initiated, Telehealth encounter is a billable service, with coverage as determined by her insurance carrier. She is aware that she may receive a bill and has provided verbal consent to proceed: Yes.     The pt is being evaluated by a phone only visit encounter for concerns as above. A caregiver was present when appropriate. Due to this being a Scientist, physiological (During QIHKV-42 public health emergency), evaluation of the following organ systems was limited: Vitals/Constitutional/EENT/Resp/CV/GI/GU/MS/Neuro/Skin/Heme-Lymph-Imm.   Pursuant to the emergency declaration under the [place] waiver authority and the [employer] Act, this Virtual phone only Visit was conducted, with patient's (and/or legal guardian's) consent, to reduce the patient's risk of exposure to COVID-19 and provide necessary medical care.     Services were provided through a phone only synchronous discussion virtually to substitute for in-person clinic visit. Patient and provider were located at their individual homes.      We discussed the expected course, resolution and complications of the diagnosis(es) in detail.  Medication risks, benefits, costs, interactions, and alternatives were discussed as indicated.  I advised her to contact the office if her condition worsens, changes or fails to improve as anticipated. She expressed understanding with the diagnosis(es) and plan.      Leo Rod, MD

## 2018-11-01 NOTE — Telephone Encounter (Signed)
Patient scheduled for STAT US on 11/02/18 at 8:30am [facility] she is to arrive by 8:15am and come with full bladder. Needs to drink 32oz about 1 hour prior to arrival. Explained above directions to patient she verbalized understanding. Patient states she probably doesn't drink 32oz in a day. She is overwhelmed at the thought of drinking this much first thing in the morning and isn't sure if she can. Patient had further questions about what happens if she cant. I advised patient to drink what she can tolerate and show up for the [place] and they will inform her if they are unable to do the test. Patient stated she will try to consume the recommended amount. No further questions or concerns at this time.

## 2018-11-01 NOTE — Progress Notes (Signed)
Jamie [name] is a 78 y.o. female who was seen by synchronous (real-time) audio phone only technology on 11/01/2018.        Assessment & Plan:   Hematuria: Will treat for presumed UTI. Must r/o nephrolithiasis.   - Ultrasound (retroperitoneal) ordered stat  - Ciprofloxacin course (liquid formulation) prescribed per pt preference to pill formulations  - F/u with her in 2 wks to assess her response.  ??? I discussed the need to consider a referral to Urology pending her UA/urine cx results from earlier today.  For now, I will just wait for her final results.    ORDERS:  - [place] RETROPERITONEUM COMP; Future  - ciprofloxacin 250 mg/5 mL sumc; Take 500 mg by mouth two (2) times a day.  Dispense: 140 mL; Refill: 0        Lab review: labs are reviewed in the EHR and ordered as mentioned above     I have discussed the diagnosis with the patient and the intended plan as seen in the above orders. I have discussed medication side effects and warnings with the patient as well. I have reviewed the plan of care with the patient, accepted their input and they are in agreement with the treatment goals. All questions were answered. The patient understands the plan of care. Pt instructed if symptoms worsen to call the office or report to the ED for continued care.  Greater than 50% of the visit time was spent in counseling and/or coordination of care.  I spent 15 minutes with the patient for this phone only encounter.     Voice recognition was used to generate this report, which may have resulted in some phonetic based errors in grammar and contents. Even though attempts were made to correct all the mistakes, some may have been missed, and remained in the body of the document.      Subjective:   Jamie [name] was seen for   Chief Complaint   Patient presents with   ??? 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    Hematuria:  Sx occurred last Thursday. Hematuria is still present but resolving. Pain: She is having "soreness" with urination. She has no abdominal pain. +Bloating. Alleviating factors: none known. No relief with cranberry juice. Associated sx: urinary straining.  Incidentally, the patient had labs drawn October 24, 2018.  Her CBC and creatinine at that time were unremarkable.        Prior to Admission medications    Medication Sig Start Date End Date Taking? Authorizing Provider   lactose-reduced food (ENSURE HIGH PROTEIN PO) Take  by mouth daily as needed.    Provider, Historical   ALPRAZolam (XANAX) 0.25 mg tablet Take 1 Tab by mouth every two (2) hours as needed for Anxiety for up to 2 doses. Max Daily Amount: 3 mg. 06/20/18   Leo Rod, MD   multivit-min/iron/folic/lutein (CENTRUM SILVER WOMEN PO) Take  by mouth daily.    Provider, Historical   calcium citrate-vitamin D3 (CITRACAL + D) tablet Take 1 Tab by mouth two (2) times a day.    Provider, Historical     Allergies   Allergen Reactions   ??? Augmentin [Amoxicillin-Pot Clavulanate] Rash   ??? Clavulanic Acid Hives   ???  Lipitor [Atorvastatin] Other (comments)     Side effects   ??? Tamoxifen Nausea Only     Past Medical History:   Diagnosis Date   ??? Allergic rhinitis    ??? Arthritis    ??? Cancer ([place])     breast   ??? Colon polyps    ??? Depression    ??? Diverticulosis    ??? GERD (gastroesophageal reflux disease)    ??? Hepatomegaly    ??? IBS (irritable bowel syndrome)      Past Surgical History:   Procedure Laterality Date   ??? ENDOSCOPY, COLON, DIAGNOSTIC     ??? HX HYSTERECTOMY     ??? HX MASTECTOMY      modified right   ??? HX TONSILLECTOMY     ??? HX WISDOM TEETH EXTRACTION       Family History   Problem Relation Age of Onset   ??? Cancer Father         lung   ??? Other Mother         hyperthyroidism   ??? Coronary Artery Disease Mother    ??? Hypertension Mother     ??? Elevated Lipids Brother    ??? Cancer Brother         colon cancer   ??? Hypertension Brother      Social History     Socioeconomic History   ??? Marital status: DIVORCED     Spouse name: Not on file   ??? Number of children: Not on file   ??? Years of education: Not on file   ??? Highest education level: Not on file   Tobacco Use   ??? Smoking status: Passive Smoke Exposure - Never Smoker   ??? Smokeless tobacco: Never Used   Substance and Sexual Activity   ??? 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 Drug use: No   ??? Sexual activity: Never       ROS:  Gen: No fever/chills  HEENT: No sore throat, eye pain, ear pain, or congestion. No HA  CV: No CP  Resp: No cough/SOB  GI: No abdominal pain. +bloating  GU: +hematuria/dysuria  Derm: No rash  Neuro: No new paresthesias/weakness  Musc: No new myalgias/jt pain      LABS:  Lab Results   Component Value Date/Time    Sodium 143 10/24/2018 10:04 AM    Potassium 4.5 10/24/2018 10:04 AM    Chloride 110 10/24/2018 10:04 AM    CO2 27 10/24/2018 10:04 AM    Anion gap 6 10/24/2018 10:04 AM    Glucose 98 10/24/2018 10:04 AM    BUN 15 10/24/2018 10:04 AM    Creatinine 0.85 10/24/2018 10:04 AM    BUN/Creatinine ratio 18 10/24/2018 10:04 AM    GFR est AA >60 10/24/2018 10:04 AM    GFR est non-AA >60 10/24/2018 10:04 AM    Calcium 9.5 10/24/2018 10:04 AM       Lab Results   Component Value Date/Time    Cholesterol, total 202 (H) 05/26/2018 08:15 AM    HDL Cholesterol 63 (H) 05/26/2018 08:15 AM    LDL, calculated 114.6 (H) 05/26/2018 08:15 AM    VLDL, calculated 24.4 05/26/2018 08:15 AM    Triglyceride 122 05/26/2018 08:15 AM    CHOL/HDL Ratio 3.2 05/26/2018 08:15 AM       Lab Results   Component Value Date/Time    WBC 8.3 10/24/2018 10:04 AM  HGB (POC) 15.1 06/17/2012 02:38 PM    HGB 14.7 10/24/2018 10:04 AM    HCT (POC) 46.1 06/17/2012 02:38 PM    HCT 45.1 (H) 10/24/2018 10:04 AM    PLATELET 262 10/24/2018 10:04 AM    MCV 92.8 10/24/2018 10:04 AM       Lab Results    Component Value Date/Time    Hemoglobin A1c 5.6 11/30/2016 02:38 PM       Lab Results   Component Value Date/Time    TSH 2.13 09/08/2017 09:36 AM           Due to this being a TeleHealth phone only evaluation, many elements of the physical examination are unable to be assessed.     The pt was seen by synchronous (real-time) audio-phone only technology, and/or her healthcare decision maker, is aware that this patient-initiated, Telehealth encounter is a billable service, with coverage as determined by her insurance carrier. She is aware that she may receive a bill and has provided verbal consent to proceed: Yes.     The pt is being evaluated by a phone only visit encounter for concerns as above. A caregiver was present when appropriate. Due to this being a Scientist, physiological (During GEXBM-84 public health emergency), evaluation of the following organ systems was limited: Vitals/Constitutional/EENT/Resp/CV/GI/GU/MS/Neuro/Skin/Heme-Lymph-Imm.   Pursuant to the emergency declaration under the [place] waiver authority and the [employer] Act, this Virtual phone only Visit was conducted, with patient's (and/or legal guardian's) consent, to reduce the patient's risk of exposure to COVID-19 and provide necessary medical care.     Services were provided through a phone only synchronous discussion virtually to substitute for in-person clinic visit. Patient and provider were located at their individual homes.      We discussed the expected course, resolution and complications of the diagnosis(es) in detail.  Medication risks, benefits, costs, interactions, and alternatives were discussed as indicated.  I advised her to contact the office if her condition worsens, changes or fails to improve as anticipated. She expressed understanding with the diagnosis(es) and plan.     Leo Rod, MD

## 2018-11-02 ENCOUNTER — Inpatient Hospital Stay: Admit: 2018-11-02 | Payer: MEDICARE | Attending: Family Medicine | Primary: Family Medicine

## 2018-11-02 ENCOUNTER — Encounter

## 2018-11-02 DIAGNOSIS — R319 Hematuria, unspecified: Secondary | ICD-10-CM

## 2018-11-02 NOTE — Telephone Encounter (Signed)
Pt aware of message below and verbalized understanding. Patient is asking for [place] to mail a copy of the [place] report- will print and mail. No further questions or concerns from pt at this time.

## 2018-11-02 NOTE — Telephone Encounter (Signed)
-----   Message from Leo Rod, MD sent at 11/02/2018 12:04 PM EDT -----  Please let her know that she needs to take her antibiotic as prescribed.  Her urinalysis studies are consistent with a UTI.  Her ultrasound findings do not show any evidence of a kidney stone.  Nevertheless, given the severity of her bladder infection, I am referring her to Urology.  I will follow-up with her at her upcoming virtual appointment to assess her response and symptoms.    Dr. Marius Ditch  [facility], [place] [address]  Phone: [phone]  Fax: [phone]

## 2018-11-02 NOTE — Telephone Encounter (Signed)
Can she crush the tablet? If so, please change the prescription to the tablet as the pt cannot swallow pills well. If she cannot crush the tablet per her pharmacy team, please let me know so I can find another abx to prescribe for her.    Dr. Marius Ditch  [facility], [place] [address]  Phone: [phone]  Fax: [phone]

## 2018-11-02 NOTE — Telephone Encounter (Signed)
I phoned the pharmacy and pt can pick up ciprofloxacin tab regimen. Ok to crush this tab per my discussion with the pharmacist and take with food. Please have her pick up the prescription.    Dr. Marius Ditch  [facility], [place] [address]  Phone: [phone]  Fax: [phone]

## 2018-11-02 NOTE — Telephone Encounter (Signed)
Attempted to call pharmacy multiple times. Unable to reach anyone- will try again later.

## 2018-11-02 NOTE — Progress Notes (Signed)
Please let her know that she needs to take her antibiotic as prescribed.  Her urinalysis studies are consistent with a UTI.  Her ultrasound findings do not show any evidence of a kidney stone.  Nevertheless, given the severity of her bladder infection, I am referring her to Urology.  I will follow-up with her at her upcoming virtual appointment to assess her response and symptoms.    Dr. Marius Ditch  [facility], [place] [address]  Phone: [phone]  Fax: [phone]

## 2018-11-02 NOTE — Progress Notes (Signed)
Please let her know that she needs to take her antibiotic as prescribed.  Her urinalysis studies are consistent with a UTI.  Her ultrasound findings do not show any evidence of a kidney stone.  Nevertheless, given the severity of her bladder infection, I am referring her to Urology.  I will follow-up with her at her upcoming virtual appointment to assess her response and symptoms.    Dr. Marius Ditch  [facility], [place] [address]  Phone: [phone]  Fax: [phone]

## 2018-11-04 LAB — CULTURE, URINE
Colonies Counted: >100000
Colony Count: >100000

## 2018-11-04 NOTE — Telephone Encounter (Signed)
-----   Message from Leo Rod, MD sent at 11/01/2018  2:25 PM EDT -----  Regarding: F/u apt needed  Please schedule patient for a follow-up 30-minute appointment with me in 2 weeks.    Thanks,  Dr. Marius Ditch  [facility], [place] [address]  Phone: [phone]  Fax: [phone]

## 2018-11-08 ENCOUNTER — Ambulatory Visit: Payer: Medicare Other | Admitting: Family Medicine

## 2018-11-16 ENCOUNTER — Ambulatory Visit: Payer: Medicare Other | Admitting: Family Medicine

## 2018-11-22 ENCOUNTER — Encounter: Attending: Family Medicine | Primary: Family Medicine

## 2018-11-23 ENCOUNTER — Encounter: Attending: Hematology & Oncology | Primary: Family Medicine

## 2018-12-12 ENCOUNTER — Telehealth: Attending: Family Medicine | Primary: Family Medicine

## 2018-12-12 ENCOUNTER — Telehealth: Admit: 2018-12-12 | Discharge: 2018-12-12 | Payer: MEDICARE | Attending: Family Medicine | Primary: Family Medicine

## 2018-12-12 DIAGNOSIS — R300 Dysuria: Secondary | ICD-10-CM

## 2018-12-12 MED ORDER — CIPROFLOXACIN 500 MG TAB
500 mg | ORAL_TABLET | Freq: Two times a day (BID) | ORAL | 0 refills | Status: AC
Start: 2018-12-12 — End: 2018-12-19

## 2018-12-12 NOTE — Telephone Encounter (Signed)
Pt returned call

## 2018-12-12 NOTE — Telephone Encounter (Signed)
Patient reached, she was given message per DR.Mason. Patient started to raise her voice and stated" I don't want to see a urologist, I have the letter they sent me but I didn't call them because I felt better after DR.Mason gave me antibiotics". Patient started to raise her voice even more and said"I want to have my urine tested now". I informed the patient that I would send to provider.

## 2018-12-12 NOTE — Telephone Encounter (Signed)
-----   Message from Leo Rod, MD sent at 12/12/2018  3:38 PM EDT -----  Regarding: F/u apts needed  Please cancel the patient's upcoming appointment later this month and please reschedule her for a follow-up appointment in late September or early October.  Please make a 30-minute virtual appointment.  Additionally, please schedule her to drop off a urine sample tomorrow morning at our lab.  Thanks,  Dr. Marius Ditch  [facility], [place] [address]  Phone: [phone]  Fax: [phone]

## 2018-12-12 NOTE — Telephone Encounter (Signed)
appt scheduled

## 2018-12-12 NOTE — Telephone Encounter (Signed)
Tried reaching patient, mailbox is full. Will continue to try and reach the patient.

## 2018-12-12 NOTE — Telephone Encounter (Signed)
appt made

## 2018-12-12 NOTE — Progress Notes (Signed)
Jamie [name] is a 78 y.o. female who was seen by synchronous (real-time) audio-phone only technology on 12/12/2018.        Assessment & Plan:   UTI: Presumed per sx history.  She is also having urinary incontinence symptoms, from OAB?  - Ordering a UA and urine cx  - Ordering ciprofloxacin since this resolved her sx last month and per her last urine cx sensitivity results.  - Notify me if sx do not resolve with the abx course.  - I discussed the need for a referral to Urology.  She is extremely hesitant.  She had a bad experience with a urologist 20 years ago.  I discussed my expectation that she will have to go if she has a third UTI within 3 to 6 months  ???The patient declines a trial of an OAB medication      Lab review: labs are reviewed in the EHR and ordered as mentioned above.     I have discussed the diagnosis with the patient and the intended plan as seen in the above orders. I have discussed medication side effects and warnings with the patient as well. I have reviewed the plan of care with the patient, accepted their input and they are in agreement with the treatment goals. All questions were answered. The patient understands the plan of care. Pt instructed if symptoms worsen to call the office or report to the ED for continued care.  Greater than 50% of the visit time was spent in counseling and/or coordination of care.  I spent 17 minutes with patient this phone only encounter.     Voice recognition was used to generate this report, which may have resulted in some phonetic based errors in grammar and contents. Even though attempts were made to correct all the mistakes, some may have been missed, and remained in the body of the document.      Subjective:   Jamie [name] was seen for   Chief Complaint   Patient presents with   ??? 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  She has a hard time swallowing pills.    UTI: The patient had hematuria and dysuria last month.  A urinalysis was obtained and findings are consistent with a UTI.  She was given a 2 L of ciprofloxacin.  She completed this course of antibiotics without any adverse side effects.  A urine culture was obtained and positive for E. coli.  She was subsequently referred to a urologist given the severity of her UTI.  Note that the peritoneal ultrasound was also obtained.  Findings are listed below.  Today she reports: dysuria x 2-3 days. No hematuria. +Urine incontinence. She wears disposable underwear at night. She has urgency sx long term for yrs. +Lower back pain.          Prior to Admission medications    Medication Sig Start Date End Date Taking? Authorizing Provider   ciprofloxacin 250 mg/5 mL sumc Take 500 mg by mouth two (2) times a day. 11/01/18   Leo Rod, MD   lactose-reduced food (ENSURE HIGH PROTEIN PO) Take  by mouth daily as needed.    Provider, Historical   ALPRAZolam (XANAX) 0.25 mg tablet Take 1 Tab by  mouth every two (2) hours as needed for Anxiety for up to 2 doses. Max Daily Amount: 3 mg. 06/20/18   Leo Rod, MD   multivit-min/iron/folic/lutein (CENTRUM SILVER WOMEN PO) Take  by mouth daily.    Provider, Historical   calcium citrate-vitamin D3 (CITRACAL + D) tablet Take 1 Tab by mouth two (2) times a day.    Provider, Historical     Allergies   Allergen Reactions   ??? Augmentin [Amoxicillin-Pot Clavulanate] Rash   ??? Clavulanic Acid Hives   ??? Lipitor [Atorvastatin] Other (comments)     Side effects   ??? Tamoxifen Nausea Only     Past Medical History:   Diagnosis Date   ??? Allergic rhinitis    ??? Arthritis    ??? Cancer ([place])     breast   ??? Colon polyps    ??? Depression    ??? Diverticulosis    ??? GERD (gastroesophageal reflux disease)    ??? Hepatomegaly     ??? IBS (irritable bowel syndrome)      Past Surgical History:   Procedure Laterality Date   ??? ENDOSCOPY, COLON, DIAGNOSTIC     ??? HX HYSTERECTOMY     ??? HX MASTECTOMY      modified right   ??? HX TONSILLECTOMY     ??? HX WISDOM TEETH EXTRACTION       Family History   Problem Relation Age of Onset   ??? Cancer Father         lung   ??? Other Mother         hyperthyroidism   ??? Coronary Artery Disease Mother    ??? Hypertension Mother    ??? Elevated Lipids Brother    ??? Cancer Brother         colon cancer   ??? Hypertension Brother      Social History     Socioeconomic History   ??? Marital status: DIVORCED     Spouse name: Not on file   ??? Number of children: Not on file   ??? Years of education: Not on file   ??? Highest education level: Not on file   Tobacco Use   ??? Smoking status: Passive Smoke Exposure - Never Smoker   ??? Smokeless tobacco: Never Used   Substance and Sexual Activity   ??? 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 Drug use: No   ??? Sexual activity: Never       ROS:  Gen: No fever/chills  HEENT: No sore throat, eye pain, ear pain, or congestion. No HA  CV: No CP  Resp: No cough/SOB  GI: +Abdominal pain  GU: +Dysuria.   Derm: No rash  Neuro: No new paresthesias/weakness  Musc: No new myalgias/jt pain  Psych: No depression sx      LABS:  Lab Results   Component Value Date/Time    Sodium 143 10/24/2018 10:04 AM    Potassium 4.5 10/24/2018 10:04 AM    Chloride 110 10/24/2018 10:04 AM    CO2 27 10/24/2018 10:04 AM    Anion gap 6 10/24/2018 10:04 AM    Glucose 98 10/24/2018 10:04 AM    BUN 15 10/24/2018 10:04 AM    Creatinine 0.85 10/24/2018 10:04 AM    BUN/Creatinine ratio 18 10/24/2018 10:04 AM    GFR est AA >60 10/24/2018 10:04 AM    GFR est non-AA >60 10/24/2018 10:04 AM  Calcium 9.5 10/24/2018 10:04 AM       Lab Results   Component Value Date/Time    Cholesterol, total 202 (H) 05/26/2018 08:15 AM    HDL Cholesterol 63 (H) 05/26/2018 08:15 AM    LDL, calculated 114.6 (H) 05/26/2018 08:15 AM     VLDL, calculated 24.4 05/26/2018 08:15 AM    Triglyceride 122 05/26/2018 08:15 AM    CHOL/HDL Ratio 3.2 05/26/2018 08:15 AM       Lab Results   Component Value Date/Time    WBC 8.3 10/24/2018 10:04 AM    HGB (POC) 15.1 06/17/2012 02:38 PM    HGB 14.7 10/24/2018 10:04 AM    HCT (POC) 46.1 06/17/2012 02:38 PM    HCT 45.1 (H) 10/24/2018 10:04 AM    PLATELET 262 10/24/2018 10:04 AM    MCV 92.8 10/24/2018 10:04 AM       Lab Results   Component Value Date/Time    Hemoglobin A1c 5.6 11/30/2016 02:38 PM       Lab Results   Component Value Date/Time    TSH 2.13 09/08/2017 09:36 AM           Due to this being a TeleHealth phone only evaluation, many elements of the physical examination are unable to be assessed.     The pt was seen by synchronous (real-time) audio phone only technology, and/or her healthcare decision maker, is aware that this patient-initiated, Telehealth encounter is a billable service, with coverage as determined by her insurance carrier. She is aware that she may receive a bill and has provided verbal consent to proceed: Yes.     The pt is being evaluated by a phone only visit encounter for concerns as above. A caregiver was present when appropriate. Due to this being a Scientist, physiological (During OFBPZ-02 public health emergency), evaluation of the following organ systems was limited: Vitals/Constitutional/EENT/Resp/CV/GI/GU/MS/Neuro/Skin/Heme-Lymph-Imm.   Pursuant to the emergency declaration under the [place], [place] waiver authority and the [employer] Act, this Virtual phone only Visit was conducted, with patient's (and/or legal guardian's) consent, to reduce the patient's risk of exposure to COVID-19 and provide necessary medical care.     Services were provided through a phone only synchronous discussion virtually to substitute for in-person clinic visit. Patient and provider  were located at their individual homes.      We discussed the expected course, resolution and complications of the diagnosis(es) in detail.  Medication risks, benefits, costs, interactions, and alternatives were discussed as indicated.  I advised her to contact the office if her condition worsens, changes or fails to improve as anticipated. She expressed understanding with the diagnosis(es) and plan.     Leo Rod, MD

## 2018-12-12 NOTE — Telephone Encounter (Signed)
Tried reaching the patient again, no voicemail.

## 2018-12-12 NOTE — Telephone Encounter (Signed)
Pt states she believes she has another UTI.  She is requesting to drop off a urine sample.  Please advise her at [phone].  She said to keep trying the number if she doesn't answer as this is very important to her.

## 2018-12-12 NOTE — Telephone Encounter (Signed)
Please have the patient schedule an appointment with Urology.  Given her hematuria history, she needs to be seen immediately.  Please assist her.    Dr. Marius Ditch  [facility], [place] [address]  Phone: [phone]  Fax: [phone]

## 2018-12-12 NOTE — Telephone Encounter (Signed)
fyi,Patient has not scheduled with Urology. They sent a letter to patient to call their office at [phone].

## 2018-12-12 NOTE — Progress Notes (Signed)
Jamie [name] is a 78 y.o. female who was seen by synchronous (real-time) audio-phone only technology on 12/12/2018.        Assessment & Plan:   UTI: Presumed per sx history.  She is also having urinary incontinence symptoms, from OAB?  - Ordering a UA and urine cx  - Ordering ciprofloxacin since this resolved her sx last month and per her last urine cx sensitivity results.  - Notify me if sx do not resolve with the abx course.  - I discussed the need for a referral to Urology.  She is extremely hesitant.  She had a bad experience with a urologist 20 years ago.  I discussed my expectation that she will have to go if she has a third UTI within 3 to 6 months  ???The patient declines a trial of an OAB medication      Lab review: labs are reviewed in the EHR and ordered as mentioned above.     I have discussed the diagnosis with the patient and the intended plan as seen in the above orders. I have discussed medication side effects and warnings with the patient as well. I have reviewed the plan of care with the patient, accepted their input and they are in agreement with the treatment goals. All questions were answered. The patient understands the plan of care. Pt instructed if symptoms worsen to call the office or report to the ED for continued care.  Greater than 50% of the visit time was spent in counseling and/or coordination of care.  I spent 17 minutes with patient this phone only encounter.     Voice recognition was used to generate this report, which may have resulted in some phonetic based errors in grammar and contents. Even though attempts were made to correct all the mistakes, some may have been missed, and remained in the body of the document.      Subjective:   Jamie [name] was seen for   Chief Complaint   Patient presents with   ??? 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  She has a hard time swallowing pills.    UTI: The patient had hematuria and dysuria last month.  A urinalysis was obtained and findings are consistent with a UTI.  She was given a 2 L of ciprofloxacin.  She completed this course of antibiotics without any adverse side effects.  A urine culture was obtained and positive for E. coli.  She was subsequently referred to a urologist given the severity of her UTI.  Note that the peritoneal ultrasound was also obtained.  Findings are listed below.  Today she reports: dysuria x 2-3 days. No hematuria. +Urine incontinence. She wears disposable underwear at night. She has urgency sx long term for yrs. +Lower back pain.          Prior to Admission medications    Medication Sig Start Date End Date Taking? Authorizing Provider   ciprofloxacin 250 mg/5 mL sumc Take 500 mg by mouth two (2) times a day. 11/01/18   Leo Rod, MD   lactose-reduced food (ENSURE HIGH PROTEIN PO) Take  by mouth daily as needed.    Provider, Historical   ALPRAZolam (XANAX) 0.25 mg tablet Take 1 Tab by  mouth every two (2) hours as needed for Anxiety for up to 2 doses. Max Daily Amount: 3 mg. 06/20/18   Leo Rod, MD   multivit-min/iron/folic/lutein (CENTRUM SILVER WOMEN PO) Take  by mouth daily.    Provider, Historical   calcium citrate-vitamin D3 (CITRACAL + D) tablet Take 1 Tab by mouth two (2) times a day.    Provider, Historical     Allergies   Allergen Reactions   ??? Augmentin [Amoxicillin-Pot Clavulanate] Rash   ??? Clavulanic Acid Hives   ??? Lipitor [Atorvastatin] Other (comments)     Side effects   ??? Tamoxifen Nausea Only     Past Medical History:   Diagnosis Date   ??? Allergic rhinitis    ??? Arthritis    ??? Cancer ([place])     breast   ??? Colon polyps    ??? Depression    ??? Diverticulosis    ??? GERD (gastroesophageal reflux disease)    ??? Hepatomegaly    ??? IBS  (irritable bowel syndrome)      Past Surgical History:   Procedure Laterality Date   ??? ENDOSCOPY, COLON, DIAGNOSTIC     ??? HX HYSTERECTOMY     ??? HX MASTECTOMY      modified right   ??? HX TONSILLECTOMY     ??? HX WISDOM TEETH EXTRACTION       Family History   Problem Relation Age of Onset   ??? Cancer Father         lung   ??? Other Mother         hyperthyroidism   ??? Coronary Artery Disease Mother    ??? Hypertension Mother    ??? Elevated Lipids Brother    ??? Cancer Brother         colon cancer   ??? Hypertension Brother      Social History     Socioeconomic History   ??? Marital status: DIVORCED     Spouse name: Not on file   ??? Number of children: Not on file   ??? Years of education: Not on file   ??? Highest education level: Not on file   Tobacco Use   ??? Smoking status: Passive Smoke Exposure - Never Smoker   ??? Smokeless tobacco: Never Used   Substance and Sexual Activity   ??? 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 Drug use: No   ??? Sexual activity: Never       ROS:  Gen: No fever/chills  HEENT: No sore throat, eye pain, ear pain, or congestion. No HA  CV: No CP  Resp: No cough/SOB  GI: +Abdominal pain  GU: +Dysuria.   Derm: No rash  Neuro: No new paresthesias/weakness  Musc: No new myalgias/jt pain  Psych: No depression sx      LABS:  Lab Results   Component Value Date/Time    Sodium 143 10/24/2018 10:04 AM    Potassium 4.5 10/24/2018 10:04 AM    Chloride 110 10/24/2018 10:04 AM    CO2 27 10/24/2018 10:04 AM    Anion gap 6 10/24/2018 10:04 AM    Glucose 98 10/24/2018 10:04 AM    BUN 15 10/24/2018 10:04 AM    Creatinine 0.85 10/24/2018 10:04 AM    BUN/Creatinine ratio 18 10/24/2018 10:04 AM    GFR est AA >60 10/24/2018 10:04 AM    GFR est non-AA >60 10/24/2018 10:04 AM  Calcium 9.5 10/24/2018 10:04 AM       Lab Results   Component Value Date/Time    Cholesterol, total 202 (H) 05/26/2018 08:15 AM    HDL Cholesterol 63 (H) 05/26/2018 08:15 AM    LDL, calculated 114.6 (H) 05/26/2018 08:15 AM    VLDL,  calculated 24.4 05/26/2018 08:15 AM    Triglyceride 122 05/26/2018 08:15 AM    CHOL/HDL Ratio 3.2 05/26/2018 08:15 AM       Lab Results   Component Value Date/Time    WBC 8.3 10/24/2018 10:04 AM    HGB (POC) 15.1 06/17/2012 02:38 PM    HGB 14.7 10/24/2018 10:04 AM    HCT (POC) 46.1 06/17/2012 02:38 PM    HCT 45.1 (H) 10/24/2018 10:04 AM    PLATELET 262 10/24/2018 10:04 AM    MCV 92.8 10/24/2018 10:04 AM       Lab Results   Component Value Date/Time    Hemoglobin A1c 5.6 11/30/2016 02:38 PM       Lab Results   Component Value Date/Time    TSH 2.13 09/08/2017 09:36 AM           Due to this being a TeleHealth phone only evaluation, many elements of the physical examination are unable to be assessed.     The pt was seen by synchronous (real-time) audio phone only technology, and/or her healthcare decision maker, is aware that this patient-initiated, Telehealth encounter is a billable service, with coverage as determined by her insurance carrier. She is aware that she may receive a bill and has provided verbal consent to proceed: Yes.     The pt is being evaluated by a phone only visit encounter for concerns as above. A caregiver was present when appropriate. Due to this being a Scientist, physiological (During JIRCV-89 public health emergency), evaluation of the following organ systems was limited: Vitals/Constitutional/EENT/Resp/CV/GI/GU/MS/Neuro/Skin/Heme-Lymph-Imm.   Pursuant to the emergency declaration under the [place], [place] waiver authority and the [employer] Act, this Virtual phone only Visit was conducted, with patient's (and/or legal guardian's) consent, to reduce the patient's risk of exposure to COVID-19 and provide necessary medical care.     Services were provided through a phone only synchronous discussion virtually to substitute for in-person clinic visit. Patient and provider were located at their individual  homes.      We discussed the expected course, resolution and complications of the diagnosis(es) in detail.  Medication risks, benefits, costs, interactions, and alternatives were discussed as indicated.  I advised her to contact the office if her condition worsens, changes or fails to improve as anticipated. She expressed understanding with the diagnosis(es) and plan.     Leo Rod, MD

## 2018-12-13 ENCOUNTER — Encounter: Admit: 2018-12-13 | Discharge: 2018-12-13 | Payer: MEDICARE | Primary: Family Medicine

## 2018-12-13 ENCOUNTER — Ambulatory Visit: Primary: Family Medicine

## 2018-12-13 ENCOUNTER — Ambulatory Visit: Payer: MEDICARE | Primary: Family Medicine

## 2018-12-16 ENCOUNTER — Encounter

## 2018-12-16 ENCOUNTER — Inpatient Hospital Stay: Admit: 2018-12-16 | Payer: MEDICARE | Primary: Family Medicine

## 2018-12-16 DIAGNOSIS — R300 Dysuria: Secondary | ICD-10-CM

## 2018-12-19 LAB — CULTURE, URINE
Colonies Counted: >100000
Colony Count: >100000

## 2018-12-20 ENCOUNTER — Encounter: Attending: Internal Medicine | Primary: Family Medicine

## 2018-12-21 ENCOUNTER — Encounter: Attending: Family Medicine | Primary: Family Medicine

## 2018-12-21 NOTE — Telephone Encounter (Signed)
Patient is calling for urine culture results.

## 2018-12-21 NOTE — Telephone Encounter (Signed)
Called patient and gave her the below message. She denies having any symptoms at this time. She states she wants to figure out what keeps causing the UTI's and how is she sure that the UTI has completely resolved this time. Patient reports she can not keep going thru this every month. I asked patient if she has ever seen a Dealer and she stated no. I asked if she would be interested and she stated not at this time. At this time she just wants answers and to get to the bottom of this. Patient became very anxious on the phone and stated she was getting frustrated and needed to hang up the phone. Patient disconnected the call.

## 2018-12-21 NOTE — Telephone Encounter (Signed)
Please let her know that her culture from E. coli base urinary tract infection.  Her antibiotic should have worked to resolve this infection per the culture results.    Dr. Marius Ditch  [facility], [place] [address]  Phone: [phone]  Fax: [phone]

## 2019-01-03 ENCOUNTER — Ambulatory Visit: Payer: MEDICARE | Primary: Family Medicine

## 2019-01-17 NOTE — Progress Notes (Signed)
The patient has called multiple times leaving angry messages about calling her back to cancel and reschedule her 10/14 appt. I have tried twice, only to get "the voicemailbox is full and can not take messages at this time"

## 2019-01-18 ENCOUNTER — Encounter: Payer: Medicare Other | Admitting: Family Medicine

## 2019-01-25 ENCOUNTER — Encounter: Attending: Family Medicine | Primary: Family Medicine

## 2019-01-26 ENCOUNTER — Other Ambulatory Visit: Payer: Self-pay | Admitting: Family Medicine

## 2019-01-26 DIAGNOSIS — Z1231 Encounter for screening mammogram for malignant neoplasm of breast: Secondary | ICD-10-CM

## 2019-01-31 ENCOUNTER — Encounter: Payer: Medicare Other | Admitting: Family Medicine

## 2019-02-01 ENCOUNTER — Inpatient Hospital Stay: Payer: MEDICARE | Attending: Internal Medicine | Primary: Family Medicine

## 2019-02-07 ENCOUNTER — Encounter: Payer: Medicare Other | Admitting: Family Medicine

## 2019-02-08 ENCOUNTER — Encounter

## 2019-02-08 ENCOUNTER — Encounter: Attending: Internal Medicine | Primary: Family Medicine

## 2019-02-08 NOTE — Telephone Encounter (Signed)
Patient called requested xanax for the CT scan in November; patient has been given this before for prior scans due to anxiety.

## 2019-02-09 ENCOUNTER — Ambulatory Visit: Payer: MEDICARE | Primary: Family Medicine

## 2019-02-09 MED ORDER — ALPRAZOLAM 0.25 MG TAB
0.25 mg | ORAL_TABLET | Freq: Two times a day (BID) | ORAL | 0 refills | Status: DC
Start: 2019-02-09 — End: 2019-06-28

## 2019-02-14 ENCOUNTER — Telehealth

## 2019-02-14 MED ORDER — DIPHENHYDRAMINE 25 MG CAP
25 mg | Freq: Four times a day (QID) | ORAL | Status: AC | PRN
Start: 2019-02-14 — End: 2019-02-24

## 2019-02-14 NOTE — Telephone Encounter (Signed)
Patient needs the benadryl as well for the CT scan next month.

## 2019-02-16 ENCOUNTER — Encounter

## 2019-02-16 MED ORDER — DIPHENHYDRAMINE 25 MG TAB
25 mg | ORAL_TABLET | ORAL | 0 refills | Status: AC
Start: 2019-02-16 — End: ?

## 2019-02-16 MED ORDER — PREDNISONE 10 MG TAB
10 mg | ORAL_TABLET | ORAL | 0 refills | Status: DC
Start: 2019-02-16 — End: 2019-06-28

## 2019-02-16 MED ORDER — PREDNISONE 20 MG TAB
20 mg | ORAL_TABLET | ORAL | 0 refills | Status: DC
Start: 2019-02-16 — End: 2019-06-21

## 2019-02-16 MED ORDER — FAMOTIDINE 20 MG TAB
20 mg | ORAL_TABLET | ORAL | 0 refills | Status: AC
Start: 2019-02-16 — End: ?

## 2019-02-22 ENCOUNTER — Encounter: Payer: Self-pay | Admitting: Family Medicine

## 2019-02-22 ENCOUNTER — Other Ambulatory Visit: Payer: Self-pay

## 2019-02-22 ENCOUNTER — Ambulatory Visit (INDEPENDENT_AMBULATORY_CARE_PROVIDER_SITE_OTHER): Payer: Medicare Other | Admitting: Family Medicine

## 2019-02-22 ENCOUNTER — Ambulatory Visit (INDEPENDENT_AMBULATORY_CARE_PROVIDER_SITE_OTHER): Payer: Medicare Other

## 2019-02-22 VITALS — BP 103/63 | HR 72 | Ht 66.0 in | Wt 136.0 lb

## 2019-02-22 DIAGNOSIS — R10827 Generalized rebound abdominal tenderness: Secondary | ICD-10-CM

## 2019-02-22 DIAGNOSIS — R634 Abnormal weight loss: Secondary | ICD-10-CM | POA: Diagnosis not present

## 2019-02-22 DIAGNOSIS — Z1231 Encounter for screening mammogram for malignant neoplasm of breast: Secondary | ICD-10-CM

## 2019-02-22 DIAGNOSIS — R300 Dysuria: Secondary | ICD-10-CM

## 2019-02-22 DIAGNOSIS — R5383 Other fatigue: Secondary | ICD-10-CM

## 2019-02-22 DIAGNOSIS — N390 Urinary tract infection, site not specified: Secondary | ICD-10-CM

## 2019-02-22 DIAGNOSIS — Z800 Family history of malignant neoplasm of digestive organs: Secondary | ICD-10-CM

## 2019-02-22 DIAGNOSIS — Z853 Personal history of malignant neoplasm of breast: Secondary | ICD-10-CM

## 2019-02-22 DIAGNOSIS — E559 Vitamin D deficiency, unspecified: Secondary | ICD-10-CM

## 2019-02-22 DIAGNOSIS — D538 Other specified nutritional anemias: Secondary | ICD-10-CM

## 2019-02-22 LAB — POCT URINALYSIS DIPSTICK
Glucose, UA: NEGATIVE
Nitrite, UA: NEGATIVE
Protein, UA: POSITIVE — AB
Spec Grav, UA: >=1.03 — AB (ref 1.010–1.025)
Urobilinogen, UA: 0.2 E.U./dL
pH, UA: 5.5 (ref 5.0–8.0)

## 2019-02-22 MED ORDER — NITROFURANTOIN MONOHYD MACRO 100 MG PO CAPS: 100.0000 mg | ORAL_CAPSULE | Freq: Two times a day (BID) | ORAL | 0 refills | Status: DC

## 2019-02-22 MED ORDER — PREMARIN 0.625 MG/GM VA CREA: TOPICAL_CREAM | VAGINAL | 2 refills | Status: DC

## 2019-02-22 NOTE — Assessment & Plan Note (Signed)
Will need to get estab here locally with onc at some point. She is far enough out she likely just needs a care plan but she prefers to be followed by Oncology.

## 2019-02-22 NOTE — Progress Notes (Signed)
Established Patient Office Visit  Subjective:  Patient ID: Jodi Kaufman, female    DOB: [birth year]  Age: 78 y.o. MRN: [id number]  CC:  Chief Complaint  Patient presents with  . Urinary Tract Infection    sxs x 1 wk slight burning,denies f/s/c/n/v/d abd,back,flank pain,or blood in urine    HPI Jodi Kaufman presents for urinary symptoms.  She says been going on for about a week she has not noticed any blood in her urine.  No fevers chills or sweats. This is her 3rd UTI in the last 4 months. She was treated for those locally in [place] where she lives. She says she thinks the 2nd UTI was from not finishing her ABX. She has to crush her pills and says it was so bitter she couldn't finishe it.      She has lost some weight since she was last here. She reporst has had several teeth extracted.  Says she hasn't been eating the "best" but doesn't understand why she has lost about 5 lbs unintentionally.    She also reports feeling shaky for about the last 3 years.  She is very worried about cancer. She has a prior history of BrCa and was followed by oncology until recently when her oncologist in [place] retired. She would like to get estab with someone hear. Plans on moving her soon to be closer to family. Has her mammogram scheduled for today.    Past Medical History:  Diagnosis Date  . Breast cancer [facility])     Past Surgical History:  Procedure Laterality Date  . BREAST BIOPSY    . MASTECTOMY    . MASTECTOMY W/ SENTINEL NODE BIOPSY     Right. 18 years ago.      Family History  Problem Relation Age of Onset  . Heart disease Mother   . Anemia Mother        penicous anemia.   . Alzheimer's disease Mother     Social History   Socioeconomic History  . Marital status: Single    Spouse name: Not on file  . Number of children: Not on file  . Years of education: Not on file  . Highest education level: Not on file  Occupational History  . Not on file  Social Needs  . Financial resource  strain: Not on file  . Food insecurity    Worry: Not on file    Inability: Not on file  . Transportation needs    Medical: Not on file    Non-medical: Not on file  Tobacco Use  . Smoking status: Never Smoker  . Smokeless tobacco: Never Used  Substance and Sexual Activity  . Alcohol use: Never    Frequency: Never    Comment: [place].    . Drug use: Never  . Sexual activity: Not Currently  Lifestyle  . Physical activity    Days per week: Not on file    Minutes per session: Not on file  . Stress: Not on file  Relationships  . Social Herbalist on phone: Not on file    Gets together: Not on file    Attends religious service: Not on file    Active member of club or organization: Not on file    Attends meetings of clubs or organizations: Not on file    Relationship status: Not on file  . Intimate partner violence    Fear of current or ex partner: Not on file    Emotionally  abused: Not on file    Physically abused: Not on file    Forced sexual activity: Not on file  Other Topics Concern  . Not on file  Social History Narrative   Lives in [place] but stays with her daughter 1-2 weeks out of the month.      Outpatient Medications Prior to Visit  Medication Sig Dispense Refill  . Calcium Citrate (CITRACAL PO) Take by mouth.    . Multiple Vitamins-Minerals (CENTRUM SILVER 50+WOMEN PO) Take 1 tablet by mouth daily.    Marland Kitchen nystatin cream (MYCOSTATIN) Apply 1 application topically 2 (two) times daily. 30 g 0  . predniSONE (DELTASONE) 20 MG tablet Take 2 tablets (40 mg total) by mouth daily with breakfast. 10 tablet 0   No facility-administered medications prior to visit.     Allergies  Allergen Reactions  . Atorvastatin     Other reaction(s): Other (comments) Side effects  . Clavulanic Acid Hives  . Tamoxifen Nausea Only    ROS Review of Systems    Objective:    Physical Exam  Constitutional: She is oriented to person, place, and time. She appears well-developed  and well-nourished.  HENT:  Head: Normocephalic and atraumatic.  Right Ear: External ear normal.  Left Ear: External ear normal.  Nose: Nose normal.  Mouth/Throat: Oropharynx is clear and moist.  TMs and canals are clear.   Eyes: Pupils are equal, round, and reactive to light. Conjunctivae and EOM are normal.  Neck: Neck supple. No thyromegaly present.  Cardiovascular: Normal rate, regular rhythm and normal heart sounds.  Pulmonary/Chest: Effort normal and breath sounds normal. She has no wheezes.  Abdominal: Soft. Bowel sounds are normal. She exhibits no distension and no mass. There is abdominal tenderness. There is no rebound and no guarding.  General abdominal tenderness  Lymphadenopathy:    She has no cervical adenopathy.  Neurological: She is alert and oriented to person, place, and time.  Skin: Skin is warm and dry.  Psychiatric: She has a normal mood and affect.    BP 103/63   Pulse 72   Ht '5\' 6"'$  (1.676 m)   Wt 136 lb (61.7 kg)   SpO2 97%   BMI 21.95 kg/m  Wt Readings from Last 3 Encounters:  02/22/19 136 lb (61.7 kg)  04/26/18 146 lb (66.2 kg)  02/09/18 148 lb (67.1 kg)     There are no preventive care reminders to display for this patient.  There are no preventive care reminders to display for this patient.  Lab Results  Component Value Date   TSH 1.30 09/01/2017   Lab Results  Component Value Date   WBC 8.4 09/01/2017   HGB 15.5 09/01/2017   HCT 48 (A) 09/01/2017   MCV 88.0 07/02/2007   PLT 262 09/01/2017   Lab Results  Component Value Date   NA 142 09/01/2017   K 4.6 09/01/2017   CO2 27 07/02/2007   GLUCOSE 105 (H) 07/02/2007   BUN 24 (A) 09/01/2017   CREATININE 0.9 09/01/2017   BILITOT 0.8 06/30/2007   ALKPHOS 75 09/01/2017   AST 12 (A) 09/01/2017   ALT 15 09/01/2017   PROT 6.4 06/30/2007   ALBUMIN 3.8 06/30/2007   CALCIUM 8.7 07/02/2007   Lab Results  Component Value Date   CHOL 227 (A) 09/01/2017   Lab Results  Component Value  Date   HDL 70 09/01/2017   Lab Results  Component Value Date   LDLCALC 135 09/01/2017   Lab Results  Component Value Date   TRIG 111 09/01/2017   No results found for: CHOLHDL No results found for: HGBA1C    Assessment & Plan:   Problem List Items Addressed This Visit      Other   Vitamin D deficiency   History of right breast cancer    Will need to get estab here locally with onc at some point. She is far enough out she likely just needs a care plan but she prefers to be followed by Oncology.         Other Visit Diagnoses    Burning with urination    -  Primary   Relevant Medications   nitrofurantoin, macrocrystal-monohydrate, (MACROBID) 100 MG capsule   Other Relevant Orders   POCT urinalysis dipstick (Completed)   COMPLETE METABOLIC PANEL WITH GFR   CBC with Differential/Platelet   TSH   B12   Ferritin   Sedimentation rate   Fatigue, unspecified type       Relevant Orders   COMPLETE METABOLIC PANEL WITH GFR   CBC with Differential/Platelet   TSH   B12   Ferritin   Sedimentation rate   Abnormal weight loss       Relevant Orders   COMPLETE METABOLIC PANEL WITH GFR   CBC with Differential/Platelet   TSH   B12   Ferritin   Sedimentation rate   Generalized abdominal tenderness with rebound tenderness       Relevant Orders   COMPLETE METABOLIC PANEL WITH GFR   CBC with Differential/Platelet   TSH   B12   Ferritin   Sedimentation rate   Family history of colon cancer       Relevant Orders   Ambulatory referral to Gastroenterology   Other specified nutritional anemias       Relevant Orders   CBC with Differential/Platelet   B12   Ferritin   Recurrent UTI       Relevant Medications   conjugated estrogens (PREMARIN) vaginal cream   nitrofurantoin, macrocrystal-monohydrate, (MACROBID) 100 MG capsule      Meds ordered this encounter  Medications  . conjugated estrogens (PREMARIN) vaginal cream    Sig: 1 application on external vaginal and  urethral area 2 times per week at bedtime    Dispense:  60 g    Refill:  2  . nitrofurantoin, macrocrystal-monohydrate, (MACROBID) 100 MG capsule    Sig: Take 1 capsule (100 mg total) by mouth 2 (two) times daily.    Dispense:  10 capsule    Refill:  0    UTI - will tx with nitrofurantoin. Will send culture. Call if sxs don't resolve.   Recurrent UTI  - discussed options since she is post menopausal. Will start external premarin creas.    Abnormal weight loss - will work up further. I suspect may be related to her dentition. This is a common cause of weight loss in older adults.   Family hx of colon cancer - due for screening. Will place referral.  She does have some general abdominal tenderness on exam..     Follow-up: Return in about 2 months (around 04/24/2019) for UTIs and weight loss.    [name], MD

## 2019-02-23 ENCOUNTER — Ambulatory Visit: Payer: Medicare Other

## 2019-02-23 ENCOUNTER — Other Ambulatory Visit: Payer: Self-pay | Admitting: *Deleted

## 2019-02-23 ENCOUNTER — Telehealth: Payer: Self-pay

## 2019-02-23 LAB — COMPLETE METABOLIC PANEL WITH GFR
AG Ratio: 1.9 (calc) (ref 1.0–2.5)
ALT: 8 U/L (ref 6–29)
AST: 17 U/L (ref 10–35)
Albumin: 4.4 g/dL (ref 3.6–5.1)
Alkaline phosphatase (APISO): 43 U/L (ref 37–153)
BUN/Creatinine Ratio: 23 (calc) — ABNORMAL HIGH (ref 6–22)
BUN: 22 mg/dL (ref 7–25)
CO2: 28 mmol/L (ref 20–32)
Calcium: 10.3 mg/dL (ref 8.6–10.4)
Chloride: 103 mmol/L (ref 98–110)
Creat: 0.97 mg/dL — ABNORMAL HIGH (ref 0.60–0.93)
GFR, Est African American: 65 mL/min/{1.73_m2} (ref >=60)
GFR, Est Non African American: 56 mL/min/{1.73_m2} — ABNORMAL LOW (ref >=60)
Globulin: 2.3 g/dL (calc) (ref 1.9–3.7)
Glucose, Bld: 93 mg/dL (ref 65–99)
Potassium: 4.4 mmol/L (ref 3.5–5.3)
Sodium: 140 mmol/L (ref 135–146)
Total Bilirubin: 1.3 mg/dL — ABNORMAL HIGH (ref 0.2–1.2)
Total Protein: 6.7 g/dL (ref 6.1–8.1)

## 2019-02-23 LAB — CBC WITH DIFFERENTIAL/PLATELET
Absolute Monocytes: 696 cells/uL (ref 200–950)
Basophils Absolute: 72 cells/uL (ref 0–200)
Basophils Relative: 0.9 %
Eosinophils Absolute: 88 cells/uL (ref 15–500)
Eosinophils Relative: 1.1 %
HCT: 46.2 % — ABNORMAL HIGH (ref 35.0–45.0)
Hemoglobin: 15.3 g/dL (ref 11.7–15.5)
Lymphs Abs: 1536 cells/uL (ref 850–3900)
MCH: 30.4 pg (ref 27.0–33.0)
MCHC: 33.1 g/dL (ref 32.0–36.0)
MCV: 91.7 fL (ref 80.0–100.0)
MPV: 11.2 fL (ref 7.5–12.5)
Monocytes Relative: 8.7 %
Neutro Abs: 5608 cells/uL (ref 1500–7800)
Neutrophils Relative %: 70.1 %
Platelets: 261 10*3/uL (ref 140–400)
RBC: 5.04 10*6/uL (ref 3.80–5.10)
RDW: 13 % (ref 11.0–15.0)
Total Lymphocyte: 19.2 %
WBC: 8 10*3/uL (ref 3.8–10.8)

## 2019-02-23 LAB — SEDIMENTATION RATE: Sed Rate: 6 mm/h (ref 0–30)

## 2019-02-23 LAB — FERRITIN: Ferritin: 72 ng/mL (ref 16–288)

## 2019-02-23 LAB — VITAMIN B12: Vitamin B-12: 555 pg/mL (ref 200–1100)

## 2019-02-23 LAB — TSH: TSH: 1.76 mIU/L (ref 0.40–4.50)

## 2019-02-23 MED ORDER — ESTRADIOL 0.1 MG/GM VA CREA: TOPICAL_CREAM | VAGINAL | 2 refills | Status: DC

## 2019-02-23 NOTE — Telephone Encounter (Signed)
The Premarin cost $300. All other vaginal creams cost about the same. Tier 3-4.

## 2019-02-23 NOTE — Telephone Encounter (Signed)
Let me at least try to send Estrace insrtead just to see.  One tube will last her about 3 months.

## 2019-02-23 NOTE — Telephone Encounter (Signed)
Pt advised of different medication being sent.Jodi Kaufman, [place]

## 2019-02-28 ENCOUNTER — Encounter

## 2019-03-01 ENCOUNTER — Ambulatory Visit: Payer: MEDICARE | Primary: Family Medicine

## 2019-03-02 ENCOUNTER — Ambulatory Visit: Payer: MEDICARE | Primary: Family Medicine

## 2019-03-07 ENCOUNTER — Telehealth: Payer: Self-pay | Admitting: Family Medicine

## 2019-03-07 NOTE — Telephone Encounter (Signed)
Received fax from [employer] and they denied coverage on Estradiol Cream due to it is not approved for FDA Medical condition. Placing in providers box for review. - CF

## 2019-03-07 NOTE — Telephone Encounter (Signed)
Received Fax for PA on Estradiol Cream sent through cover my meds waiting on determination. - CF

## 2019-03-08 ENCOUNTER — Ambulatory Visit: Payer: MEDICARE | Primary: Family Medicine

## 2019-03-08 ENCOUNTER — Encounter: Attending: Internal Medicine | Primary: Family Medicine

## 2019-03-09 ENCOUNTER — Encounter: Payer: BLUE CROSS/BLUE SHIELD | Attending: Internal Medicine | Primary: Family Medicine

## 2019-03-14 ENCOUNTER — Inpatient Hospital Stay: Admit: 2019-03-14 | Payer: MEDICARE | Attending: Internal Medicine | Primary: Family Medicine

## 2019-03-14 DIAGNOSIS — Z8542 Personal history of malignant neoplasm of other parts of uterus: Secondary | ICD-10-CM

## 2019-03-14 LAB — CREATININE, POC
Creatinine, POC: 0.8 MG/DL (ref 0.6–1.3)
GFRAA, POC: >60 mL/min/{1.73_m2} (ref >60)
GFRNA, POC: >60 mL/min/{1.73_m2} (ref >60)

## 2019-03-14 LAB — AMB POC CREATININE
Creatinine, POC: 0.8 MG/DL (ref 0.6–1.3)
GFR African American: >60 mL/min/{1.73_m2} (ref >60)
GFR Non-African American: >60 mL/min/{1.73_m2} (ref >60)

## 2019-03-14 MED ORDER — IOPAMIDOL 61 % IV SOLN
300 mg iodine /mL (61 %) | Freq: Once | INTRAVENOUS | Status: AC
Start: 2019-03-14 — End: 2019-03-14
  Administered 2019-03-14: 14:00:00 via INTRAVENOUS

## 2019-03-14 MED FILL — ISOVUE-300  61 % INTRAVENOUS SOLUTION: 300 mg iodine /mL (61 %) | INTRAVENOUS | Qty: 100

## 2019-03-17 NOTE — Telephone Encounter (Signed)
Pt lvm had CT Scan done on Tues doesn't know if results are back

## 2019-03-27 ENCOUNTER — Telehealth

## 2019-03-27 NOTE — Telephone Encounter (Signed)
Pt returned call

## 2019-03-27 NOTE — Telephone Encounter (Signed)
Unable to leave a voicemail, try patient again at a later time.

## 2019-03-27 NOTE — Telephone Encounter (Signed)
Patient would like a call about her CT scan, she is unable to make her appt in December and now the appt has been moved to January and she really doesn't want to wait that long for the results. She is also requesting we mail the results to her. This can be done only after she has been given the result by a provider.

## 2019-03-27 NOTE — Telephone Encounter (Signed)
Patient stating she is having left ankle pain. In the past when this happened an ultrasound was done to check for blood clot. It was negative. Stating the pain is much worse this time.

## 2019-03-28 NOTE — Telephone Encounter (Signed)
Patient reached and she would like to see orthopedic, she is also asking if we can do a DMV placard, due to the ankle pain. Should the patient wait for eval with ortho?

## 2019-03-28 NOTE — Telephone Encounter (Signed)
Per DR.Mason the patient can be referred to Orthopedics for evaluation.

## 2019-03-28 NOTE — Addendum Note (Signed)
Addended by: Cyndia Bent A on: 03/28/2019 10:21 AM     Modules accepted: Orders

## 2019-03-28 NOTE — Telephone Encounter (Signed)
Patient reached, she states that she is having sharp pain in the ankle x 1 day. Patient denies any injury, swelling, or redness.

## 2019-03-28 NOTE — Telephone Encounter (Signed)
Will defer any handicap plaquard to be completed by Orthopedics.     Dr. Marius Ditch  [facility], [place] [address]  Phone: [phone]  Fax: [phone]

## 2019-03-28 NOTE — Addendum Note (Signed)
Addendum Note by Crist Infante, LPN at [id number]                Author: Crist Infante, LPN  Service: --  Author Type: Licensed Nurse       Filed: 03/28/19 1021  Encounter Date: 03/27/2019  Status: Signed          Editor: Crist Infante, LPN (Licensed Nurse)          Addended by: Cyndia Bent A on: 03/28/2019 10:21 AM    Modules accepted: Orders

## 2019-03-29 NOTE — Telephone Encounter (Signed)
Patient reached and given message per DR.Mason. Patient also given number to ortho to go ahead and schedule her appointment.

## 2019-03-29 NOTE — Telephone Encounter (Signed)
Unable to leave a voicemail for patient. Will try again at a later time.

## 2019-04-04 ENCOUNTER — Ambulatory Visit: Attending: Foot and Ankle Surgery | Primary: Family Medicine

## 2019-04-04 ENCOUNTER — Ambulatory Visit
Admit: 2019-04-04 | Discharge: 2019-04-04 | Payer: MEDICARE | Attending: Foot and Ankle Surgery | Primary: Family Medicine

## 2019-04-04 DIAGNOSIS — M25572 Pain in left ankle and joints of left foot: Secondary | ICD-10-CM

## 2019-04-04 DIAGNOSIS — G8929 Other chronic pain: Secondary | ICD-10-CM

## 2019-04-04 NOTE — Addendum Note (Signed)
Addended by: Morene Crocker on: 04/07/2019 10:29 AM     Modules accepted: Orders

## 2019-04-04 NOTE — Progress Notes (Addendum)
AMBULATORY PROGRESS NOTE      Patient: Jamie [name]             MRN: [id number]     SSN: [id number] Body mass index is 22.27 kg/m??.  Date of Birth: 24-Jan-1941     AGE: 78 y.o.       EX: female    PCP: Leo Rod, MD       IMPRESSION //  DIAGNOSIS AND TREATMENT PLAN      DIAGNOSES  1. Chronic pain of left ankle    2. Left hip pain    3. Left leg pain    4. Posterior tibial tendinitis of right lower extremity        Orders Placed This Encounter   ??? Generic Supply Order     left Airsport Aircast ankle brace   ??? AMB POC XRAY, ANKLE; COMPLETE, 3+ VIE     ASK ALL FEMALE PATIENTS IF THEY ARE PREGNANT     Order Specific Question:   Reason for Exam     Answer:   PAIN   ??? MRI TIB/FIB LT WO CONT     Please include left ankle     Standing Status:   Future     Standing Expiration Date:   10/03/2019   ??? AMB POC XRAY; TIBIA & FIBULA, TWO VIE     Order Specific Question:   Reason for Exam     Answer:   left hip pain   ??? [73552] Femur, Min 2 Views     Order Specific Question:   Reason for Exam     Answer:   pain   ??? REFERRAL TO NEUROLOGY     Referral Priority:   Routine     Referral Type:   Consultation     Referral Reason:   Specialty Services Required     Referred to Provider:   Toy Cookey, MD     Requested Specialty:   Neurology     Number of Visits Requested:   1        In this individual, there is tenderness to the anterior tib tendon posterior tendon medial region, of her left ankle and medial distal tibial flare, recommendation, MRI of her left tib-fib left ankle.  She is a history of cancer in the past, x2, without any history of recurrence of her breast cancer no history of her initial cancer that she had believe cervical cancer.    I did obtain x-rays, of the tib-fib, and left femur, just to ensure that I do not see any deleterious lesions.  I see none.  My interpretations are listed in the diagnostic section of this report.    PLAN:    1. MRI of  L tib/fib to include L ankle// patient wishes to have the MRI  done after Christmas.   2. X-RAYS of Left tib/fib and Left femur were taken in the office today   3. DME order: left Air sport Aircast ankle brace  4. DMV paperwork for temporary parking pass.   5. Referral to Neurology for assessment and evaluation for tremors.  6. Anticipate bone scan if worsening pain in knee and hip    RTO-after MRI      HPI //  [place] IS A 78 y.o. female who presents to my outpatient office for evaluation of: left ankle pain and left knee pain.     Today she has chief complaint of  left ankle pain along the posteromedial hindfoot and achilles. She denies any major pain in her ankle when sedentary or sleeping. Prolonged walking and standing worsens the pain in her ankle. In recent weeks, the pain in her left ankle has radiated up to her knee. In her left ankle, she describes having sharp, piercing pain. In her left knee, she describes more of a soreness. Patient also mentions redness in her toes and cool sensations in both feet (L>R).      On examination, she reports pain with palpation along the anterior portion of her knee and along her lateral hip. Crossing her left leg over her right leg reproduces the pain in her lateral hip.    Patient has PMHx for cervical cancer 30 years ago, with no chemotherapy and breast cancer 26 years ago, also with no chemotherapy. Patient reports tremors and shaking. She is currently taking Vitamin D supplements for Vitamin D deficiency.       Visit Vitals  BP 135/80 (BP 1 Location: Left arm)   Pulse 78   Temp (!) 96.4 ??F (35.8 ??C) (Temporal)   Resp 16   Ht 5\' 6"  (1.676 m)   Wt 138 lb (62.6 kg)   SpO2 99%   BMI 22.27 kg/m??       ANKLE/FOOT left    Psychiatry: Alert, oriented x 3 (name,place,time of day); speech normal in context and clarity, memory intact grossly, no involuntary movements - tremors, no dementia  Gait: slow  Tenderness: mild anteromedial gutter, medial malleolus, distal tibia,  posterior tibial tendon, deltoid region  Cutaneous: WNL  Joint Motion: WNL  Joint / Tendon Stability: No Ankle or Subtalar instability or joint laxity.                       No peroneal sublux ability or dislocation  Alignment: neutral Hindfoot, none Metatarsus Adductus Metatarsus.  Neuro Motor/Sensory: NL/NL,  Vascular: NL foot/ankle pulses,   Lymphatics: No extremity lymphedema, No calf swelling, no tenderness to calf muscles.    Left knee:     Knees:  Left       Cutaneous: Skin intact, no abrasions, blisters, wounds, erythema       Effusion: Is not present       Crepitus:  no PF joint crepitus       Tenderness: Tenderness: Medial joint line  // Tenderness medially tibia        Alignment of Knee: neutral when standing       ROM: full range of motion       Fullness or swelling: None to popliteal fossa region,         Stability: No instability to anterior, posterior, varus, valgus stress testing       Neuro: knee flexion, knee extension, plantar flexion and plantar dorsiflexion and , Extensor mechanism .Marland Kitchen are all intact         CHART REVIEW     Patient Active Problem List   Diagnosis Code   ??? 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 History of uterine cancer: hysterectomy ovaries intact Z85.42   ??? Vitamin D deficiency E55.9   ??? GERD (gastroesophageal reflux disease) K21.9   ??? History of osteopenia T score -1.9 (2008)  Z87.39   ??? Hyperlipidemia E78.5   ??? Anxiety F41.9        Jamie [name] has been experiencing pain and discomfort confirmed as outlined in the  pain assessment outlined below.      Pain Assessment  04/04/2019   Location of Pain Ankle   Location Modifiers Left   Severity of Pain 8   Quality of Pain Aching;Sharp;Other (Comment)   Quality of Pain Comment soreness   Duration of Pain Persistent   Frequency of Pain Constant   Date Pain First Started (No Data)   Aggravating Factors Standing;Walking   Limiting Behavior Yes   Relieving Factors Rest    Result of Injury No        Jamie [name]  has a past medical history of Allergic rhinitis, Arthritis, Cancer ([place]), Colon polyps, Depression, Diverticulosis, GERD (gastroesophageal reflux disease), Hepatomegaly, and IBS (irritable bowel syndrome).     Patients is employed at:         Past Medical History:   Diagnosis Date   ??? Allergic rhinitis    ??? Arthritis    ??? Cancer ([place])     breast   ??? Colon polyps    ??? Depression    ??? Diverticulosis    ??? GERD (gastroesophageal reflux disease)    ??? Hepatomegaly    ??? IBS (irritable bowel syndrome)      Past Surgical History:   Procedure Laterality Date   ??? ENDOSCOPY, COLON, DIAGNOSTIC     ??? HX HYSTERECTOMY     ??? HX MASTECTOMY      modified right   ??? HX TONSILLECTOMY     ??? HX WISDOM TEETH EXTRACTION       Current Outpatient Medications   Medication Sig   ??? diphenhydrAMINE (Benadryl Allergy) 25 mg tablet Take two tabs 13 hrs, 7 hrs and then 1 hr prior to scan.  Indications: an allergic reaction   ??? famotidine (PEPCID) 20 mg tablet Take one tab 13 hrs, 7 hrs and then 1 hr prior to scan.   ??? ALPRAZolam (XANAX) 0.25 mg tablet Take 1 Tab by mouth two (2) times a day. Max Daily Amount: 0.5 mg.   ??? lactose-reduced food (ENSURE HIGH PROTEIN PO) Take  by mouth daily as needed.   ??? multivit-min/iron/folic/lutein (CENTRUM SILVER WOMEN PO) Take  by mouth daily.   ??? calcium citrate-vitamin D3 (CITRACAL + D) tablet Take 1 Tab by mouth two (2) times a day.   ??? predniSONE (DELTASONE) 20 mg tablet Take two tabs 13 hrs, 7 hrs and 1 hour prior to scan along with 10 mg prednisone tab.   ??? predniSONE (DELTASONE) 10 mg tablet Take one tab 13 hrs, 7 hrs and then 1 hr prior to scan alone with 20 mg prednisone tabs.     No current facility-administered medications for this visit.      Allergies   Allergen Reactions   ??? Augmentin [Amoxicillin-Pot Clavulanate] Rash   ??? Clavulanic Acid Hives   ??? Lipitor [Atorvastatin] Other (comments)     Side effects   ??? Tamoxifen Nausea Only     Social History      Occupational History   ??? Not on file   Tobacco Use   ??? Smoking status: Passive Smoke Exposure - Never Smoker   ??? Smokeless tobacco: Never Used   Substance and Sexual Activity   ??? 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 Drug use: No   ??? Sexual activity: Never     Family History   Problem Relation Age of Onset   ??? Cancer Father  lung   ??? Other Mother         hyperthyroidism   ??? Coronary Artery Disease Mother    ??? Hypertension Mother    ??? Elevated Lipids Brother    ??? Cancer Brother         colon cancer   ??? Hypertension Brother        THE PMP FOR [place] C [place]  WAS REVIEWED BY Teressa Lower, MD 04/04/2019 .      DIAGNOSTIC IMAGING  LAB DATA      Lab Results   Component Value Date/Time    Hemoglobin A1c 5.6 11/30/2016 02:38 PM    //   Lab Results   Component Value Date/Time    Glucose 98 10/24/2018 10:04 AM        No results found for: HBA1CEXT, HBA1CPOC      Lab Results   Component Value Date/Time    Vitamin D 25-Hydroxy 25.9 (L) 09/08/2017 09:36 AM         REVIEW OF SYSTEMS : 04/04/2019  ALL BELOW ARE Negative except : SEE HPI     CONSTITUTIONAL: No weight loss  PSYCHOLOGICAL : No Feelings of anxiety, depression, agitation  EYES: No blurred vision and no eye discharge. NO eye pain, double vision  ENT: No nasal discharge. No ear pain.   CARDIOVASCULAR: No chest pain and no diaphoresis.   RESPIRATORY: No cough, no hemoptysis.   GI: No vomiting, no diarrhea   GU: No urinary frequency and no dysuria.   MUSCULOSKELETAL: see HPI  SKIN: No rashes.   NEURO:  No dizziness,weakness, headaches// No visual changes or confusion, or seizures,   ENDOCRINE: No polyphagia and no polydipsia.   HEMATOLOGY: No bleeding tendencies.      DIAGNOSTIC IMAGING        X RAYS AT [place]  04/04/2019    left TIBIA FIBULA     Bones: No fractures, subluxations, or dislocations  Alignment: Normal  Soft Tissues: Normal  Mineralization: suggests Osteopenia .       DIAGNOSTIC IMAGING           X RAYS AT [place]  04/04/2019    NON WEIGHT BEARING    X RAYS/IMAGES DONE AT [facility] OFFICE 04/04/2019     {Left FEMUR     Bones: No fractures, subluxations, or dislocations  Alignment: Normal  Soft Tissues: Normal  Mineralization: suggests Osteopenia     I have personally reviewed the results of the above study. The interpretation of this study is my professional opinion.          DIAGNOSTIC IMAGING         ANKLE X RAYS 3 VIEWS LEFT  X RAYS AT [place]  04/04/2019    NON WEIGHT BEARING    X RAYS AT [place]  04/04/2019    Bones: No fractures or dislocations. No focal osteolytic or osteoblastic process     Bone Spurs: No significant bone spurs  Alignment: Ankle mortise alignment is congruent, Tibial plafond and talar dome intact.     No Osteochondral defects seen   Joint: No Significant OA changes present  Soft Tissues: Normal, No radiopaque foreign body     No abnormal calcific densities to soft tissues    No ankle joint effusion in lateral projection.  Mineralization: Suggests no Osteopenia    I have personally reviewed the results of the above study. The interpretation of this study is my professional opinion  Teressa Lower, MD  04/04/2019  8:10 AM

## 2019-04-04 NOTE — Progress Notes (Signed)
AMBULATORY PROGRESS NOTE      Patient: Jamie [name]             MRN: [id number]     SSN: [id number] Body mass index is 22.27 kg/m??.  Date of Birth: 1941/02/07     AGE: 78 y.o.       EX: [name]    PCP: Jamie Rod, MD       IMPRESSION //  DIAGNOSIS AND TREATMENT PLAN      DIAGNOSES  1. Chronic pain of left ankle    2. Left hip pain    3. Left leg pain    4. Posterior tibial tendinitis of right lower extremity        Orders Placed This Encounter   ??? Generic Supply Order     left Airsport Aircast ankle brace   ??? AMB POC XRAY, ANKLE; COMPLETE, 3+ VIE     ASK ALL [name] PATIENTS IF THEY ARE PREGNANT     Order Specific Question:   Reason for Exam     Answer:   PAIN   ??? MRI TIB/FIB LT WO CONT     Please include left ankle     Standing Status:   Future     Standing Expiration Date:   10/03/2019   ??? AMB POC XRAY; TIBIA & FIBULA, TWO VIE     Order Specific Question:   Reason for Exam     Answer:   left hip pain   ??? [73552] Femur, Min 2 Views     Order Specific Question:   Reason for Exam     Answer:   pain   ??? REFERRAL TO NEUROLOGY     Referral Priority:   Routine     Referral Type:   Consultation     Referral Reason:   Specialty Services Required     Referred to Provider:   Toy Cookey, MD     Requested Specialty:   Neurology     Number of Visits Requested:   1        In this individual, there is tenderness to the anterior tib tendon posterior tendon medial region, of her left ankle and medial distal tibial flare, recommendation, MRI of her left tib-fib left ankle.  She is a history of cancer in the past, x2, without any history of recurrence of her breast cancer no history of her initial cancer that she had believe cervical cancer.    I did obtain x-rays, of the tib-fib, and left femur, just to ensure that I do not see any deleterious lesions.  I see none.  My interpretations are listed in the diagnostic section of this report.    PLAN:    1. MRI of  L tib/fib to include L ankle// patient wishes to have the MRI  done after Christmas.   2. X-RAYS of Left tib/fib and Left femur were taken in the office today   3. DME order: left Air sport Aircast ankle brace  4. DMV paperwork for temporary parking pass.   5. Referral to Neurology for assessment and evaluation for tremors.  6. Anticipate bone scan if worsening pain in knee and hip    RTO-after MRI      HPI //  Jamie IS A 78 y.o. [name] who presents to my outpatient office for evaluation of: left ankle pain and left knee pain.     Today she has chief complaint of  left ankle pain along the posteromedial hindfoot and achilles. She denies any major pain in her ankle when sedentary or sleeping. Prolonged walking and standing worsens the pain in her ankle. In recent weeks, the pain in her left ankle has radiated up to her knee. In her left ankle, she describes having sharp, piercing pain. In her left knee, she describes more of a soreness. Patient also mentions redness in her toes and cool sensations in both feet (L>R).      On examination, she reports pain with palpation along the anterior portion of her knee and along her lateral hip. Crossing her left leg over her right leg reproduces the pain in her lateral hip.    Patient has PMHx for cervical cancer 30 years ago, with no chemotherapy and breast cancer 26 years ago, also with no chemotherapy. Patient reports tremors and shaking. She is currently taking Vitamin D supplements for Vitamin D deficiency.       Visit Vitals  BP 135/80 (BP 1 Location: Left arm)   Pulse 78   Temp (!) 96.4 ??F (35.8 ??C) (Temporal)   Resp 16   Ht 5\' 6"  (1.676 m)   Wt 138 lb (62.6 kg)   SpO2 99%   BMI 22.27 kg/m??       ANKLE/FOOT left    Psychiatry: Alert, oriented x 3 (name,place,time of day); speech normal in context and clarity, memory intact grossly, no involuntary movements - tremors, no dementia  Gait: slow  Tenderness: mild anteromedial gutter, medial malleolus, distal tibia, posterior tibial tendon, deltoid  region  Cutaneous: WNL  Joint Motion: WNL  Joint / Tendon Stability: No Ankle or Subtalar instability or joint laxity.                       No peroneal sublux ability or dislocation  Alignment: neutral Hindfoot, none Metatarsus Adductus Metatarsus.  Neuro Motor/Sensory: NL/NL,  Vascular: NL foot/ankle pulses,   Lymphatics: No extremity lymphedema, No calf swelling, no tenderness to calf muscles.    Left knee:     Knees:  Left       Cutaneous: Skin intact, no abrasions, blisters, wounds, erythema       Effusion: Is not present       Crepitus:  no PF joint crepitus       Tenderness: Tenderness: Medial joint line  // Tenderness medially tibia        Alignment of Knee: neutral when standing       ROM: full range of motion       Fullness or swelling: None to popliteal fossa region,         Stability: No instability to anterior, posterior, varus, valgus stress testing       Neuro: knee flexion, knee extension, plantar flexion and plantar dorsiflexion and , Extensor mechanism .Marland Kitchen are all intact         CHART REVIEW     Patient Active Problem List   Diagnosis Code   ??? 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 History of uterine cancer: hysterectomy ovaries intact Z85.42   ??? Vitamin D deficiency E55.9   ??? GERD (gastroesophageal reflux disease) K21.9   ??? History of osteopenia T score -1.9 (2008)  Z87.39   ??? Hyperlipidemia E78.5   ??? Anxiety F41.9        Jamie [name] has been experiencing pain and discomfort confirmed as outlined in the  pain assessment outlined below.      Pain Assessment  04/04/2019   Location of Pain Ankle   Location Modifiers Left   Severity of Pain 8   Quality of Pain Aching;Sharp;Other (Comment)   Quality of Pain Comment soreness   Duration of Pain Persistent   Frequency of Pain Constant   Date Pain First Started (No Data)   Aggravating Factors Standing;Walking   Limiting Behavior Yes   Relieving Factors Rest   Result of Injury No        Jamie [name]  has a past medical history of Allergic rhinitis, Arthritis, Cancer ([place]), Colon polyps, Depression, Diverticulosis, GERD (gastroesophageal reflux disease), Hepatomegaly, and IBS (irritable bowel syndrome).     Patients is employed at:         Past Medical History:   Diagnosis Date   ??? Allergic rhinitis    ??? Arthritis    ??? Cancer ([place])     breast   ??? Colon polyps    ??? Depression    ??? Diverticulosis    ??? GERD (gastroesophageal reflux disease)    ??? Hepatomegaly    ??? IBS (irritable bowel syndrome)      Past Surgical History:   Procedure Laterality Date   ??? ENDOSCOPY, COLON, DIAGNOSTIC     ??? HX HYSTERECTOMY     ??? HX MASTECTOMY      modified right   ??? HX TONSILLECTOMY     ??? HX WISDOM TEETH EXTRACTION       Current Outpatient Medications   Medication Sig   ??? diphenhydrAMINE (Benadryl Allergy) 25 mg tablet Take two tabs 13 hrs, 7 hrs and then 1 hr prior to scan.  Indications: an allergic reaction   ??? famotidine (PEPCID) 20 mg tablet Take one tab 13 hrs, 7 hrs and then 1 hr prior to scan.   ??? ALPRAZolam (XANAX) 0.25 mg tablet Take 1 Tab by mouth two (2) times a day. Max Daily Amount: 0.5 mg.   ??? lactose-reduced food (ENSURE HIGH PROTEIN PO) Take  by mouth daily as needed.   ??? multivit-min/iron/folic/lutein (CENTRUM SILVER WOMEN PO) Take  by mouth daily.   ??? calcium citrate-vitamin D3 (CITRACAL + D) tablet Take 1 Tab by mouth two (2) times a day.   ??? predniSONE (DELTASONE) 20 mg tablet Take two tabs 13 hrs, 7 hrs and 1 hour prior to scan along with 10 mg prednisone tab.   ??? predniSONE (DELTASONE) 10 mg tablet Take one tab 13 hrs, 7 hrs and then 1 hr prior to scan alone with 20 mg prednisone tabs.     No current facility-administered medications for this visit.      Allergies   Allergen Reactions   ??? Augmentin [Amoxicillin-Pot Clavulanate] Rash   ??? Clavulanic Acid Hives   ??? Lipitor [Atorvastatin] Other (comments)     Side effects   ??? Tamoxifen Nausea Only     Social History     Occupational History   ??? Not on file    Tobacco Use   ??? Smoking status: Passive Smoke Exposure - Never Smoker   ??? Smokeless tobacco: Never Used   Substance and Sexual Activity   ??? 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 Drug use: No   ??? Sexual activity: Never     Family History   Problem Relation Age of Onset   ??? Cancer Father  lung   ??? Other Mother         hyperthyroidism   ??? Coronary Artery Disease Mother    ??? Hypertension Mother    ??? Elevated Lipids Brother    ??? Cancer Brother         colon cancer   ??? Hypertension Brother        THE PMP FOR Jamie C Manske  WAS REVIEWED BY Teressa Lower, MD 04/04/2019 .      DIAGNOSTIC IMAGING  LAB DATA      Lab Results   Component Value Date/Time    Hemoglobin A1c 5.6 11/30/2016 02:38 PM    //   Lab Results   Component Value Date/Time    Glucose 98 10/24/2018 10:04 AM        No results found for: HBA1CEXT, HBA1CPOC      Lab Results   Component Value Date/Time    Vitamin D 25-Hydroxy 25.9 (L) 09/08/2017 09:36 AM         REVIEW OF SYSTEMS : 04/04/2019  ALL BELOW ARE Negative except : SEE HPI     CONSTITUTIONAL: No weight loss  PSYCHOLOGICAL : No Feelings of anxiety, depression, agitation  EYES: No blurred vision and no eye discharge. NO eye pain, double vision  ENT: No nasal discharge. No ear pain.   CARDIOVASCULAR: No chest pain and no diaphoresis.   RESPIRATORY: No cough, no hemoptysis.   GI: No vomiting, no diarrhea   GU: No urinary frequency and no dysuria.   MUSCULOSKELETAL: see HPI  SKIN: No rashes.   NEURO:  No dizziness,weakness, headaches// No visual changes or confusion, or seizures,   ENDOCRINE: No polyphagia and no polydipsia.   HEMATOLOGY: No bleeding tendencies.      DIAGNOSTIC IMAGING        X RAYS AT [facility]  04/04/2019    left TIBIA FIBULA     Bones: No fractures, subluxations, or dislocations  Alignment: Normal  Soft Tissues: Normal  Mineralization: suggests Osteopenia .       DIAGNOSTIC IMAGING          X RAYS AT [facility]  04/04/2019    NON WEIGHT BEARING    X RAYS/IMAGES DONE AT [facility] OFFICE 04/04/2019     {Left FEMUR     Bones: No fractures, subluxations, or dislocations  Alignment: Normal  Soft Tissues: Normal  Mineralization: suggests Osteopenia     I have personally reviewed the results of the above study. The interpretation of this study is my professional opinion.          DIAGNOSTIC IMAGING         ANKLE X RAYS 3 VIEWS LEFT  X RAYS AT [place]  04/04/2019    NON WEIGHT BEARING    X RAYS AT [facility]  04/04/2019    Bones: No fractures or dislocations. No focal osteolytic or osteoblastic process     Bone Spurs: No significant bone spurs  Alignment: Ankle mortise alignment is congruent, Tibial plafond and talar dome intact.     No Osteochondral defects seen   Joint: No Significant OA changes present  Soft Tissues: Normal, No radiopaque foreign body     No abnormal calcific densities to soft tissues    No ankle joint effusion in lateral projection.  Mineralization: Suggests no Osteopenia    I have personally reviewed the results of the above study. The interpretation of this study is my professional opinion  Teressa Lower, MD  04/04/2019  8:10 AM

## 2019-04-04 NOTE — Addendum Note (Signed)
Addendum  Note by Reita Cliche M at 04/04/19 1000                Author: Thompson Grayer  Service: --  Author Type: Technician       Filed: 04/07/19 1029  Encounter Date: 04/04/2019  Status: Signed          Editor: Old, Valentina Shaggy (Technician)          Addended by: Thompson Grayer on: 04/07/2019 10:29 AM    Modules accepted: Orders

## 2019-04-05 NOTE — Telephone Encounter (Signed)
Patients order, demographics and last  Office note were faxed to [facility] requesting an open unit. Tel# [phone] fax# [phone]

## 2019-04-05 NOTE — Telephone Encounter (Signed)
Patient called for Dr.Caines.     Patient said she saw Dr.Caines yesterday. That he was going to have her get an Mri done for the Left Tib and Fib.     Patient said she told him she wanted to get an open mri, but [facility] called her today and told her they do not have open mri . Patient would like to be set up for an open mri.    Patient said that she would first like to check out the machine at [facility] to see if she could do it.    Patient tel. [phone].

## 2019-04-07 NOTE — Telephone Encounter (Signed)
Hunter from [facility] called stating the order for a tib/fib with ankle is too large of an area and they would need a separate order for the left ankle.      [place] [phone]  Fax [phone]

## 2019-04-07 NOTE — Telephone Encounter (Signed)
New order placed and faxed back to [facility]

## 2019-04-11 ENCOUNTER — Encounter: Payer: BLUE CROSS/BLUE SHIELD | Attending: Internal Medicine | Primary: Family Medicine

## 2019-04-14 ENCOUNTER — Encounter

## 2019-04-17 NOTE — Telephone Encounter (Signed)
Pt called to find out when next appt. Due to conflicting schedule r/s from 05/09/2019 to 05/18/2019. Would like Dr.Do to call if test results were bad from scans, if not will see Dr. Lazaro Arms on appt date.

## 2019-04-25 ENCOUNTER — Ambulatory Visit: Payer: Medicare Other | Admitting: Family Medicine

## 2019-05-01 ENCOUNTER — Encounter

## 2019-05-09 ENCOUNTER — Encounter: Payer: BLUE CROSS/BLUE SHIELD | Attending: Internal Medicine | Primary: Family Medicine

## 2019-05-09 ENCOUNTER — Ambulatory Visit: Payer: Medicare Other | Admitting: Family Medicine

## 2019-05-16 ENCOUNTER — Encounter: Attending: Foot and Ankle Surgery | Primary: Family Medicine

## 2019-05-17 ENCOUNTER — Ambulatory Visit (INDEPENDENT_AMBULATORY_CARE_PROVIDER_SITE_OTHER): Payer: Medicare Other | Admitting: Family Medicine

## 2019-05-17 ENCOUNTER — Encounter: Payer: Self-pay | Admitting: Family Medicine

## 2019-05-17 ENCOUNTER — Other Ambulatory Visit: Payer: Self-pay

## 2019-05-17 VITALS — BP 132/67 | HR 73 | Ht 66.0 in | Wt 133.0 lb

## 2019-05-17 DIAGNOSIS — N390 Urinary tract infection, site not specified: Secondary | ICD-10-CM | POA: Diagnosis not present

## 2019-05-17 DIAGNOSIS — R7309 Other abnormal glucose: Secondary | ICD-10-CM | POA: Diagnosis not present

## 2019-05-17 DIAGNOSIS — R300 Dysuria: Secondary | ICD-10-CM | POA: Diagnosis not present

## 2019-05-17 DIAGNOSIS — M79621 Pain in right upper arm: Secondary | ICD-10-CM | POA: Diagnosis not present

## 2019-05-17 DIAGNOSIS — L723 Sebaceous cyst: Secondary | ICD-10-CM

## 2019-05-17 DIAGNOSIS — R634 Abnormal weight loss: Secondary | ICD-10-CM

## 2019-05-17 LAB — POCT URINALYSIS DIP (CLINITEK)
Bilirubin, UA: NEGATIVE
Blood, UA: NEGATIVE
Glucose, UA: NEGATIVE mg/dL
Ketones, POC UA: NEGATIVE mg/dL
Leukocytes, UA: NEGATIVE
Nitrite, UA: NEGATIVE
POC PROTEIN,UA: 30 — AB
Spec Grav, UA: >=1.03 — AB (ref 1.010–1.025)
Urobilinogen, UA: 0.2 E.U./dL
pH, UA: 6 (ref 5.0–8.0)

## 2019-05-17 LAB — POCT GLYCOSYLATED HEMOGLOBIN (HGB A1C): Hemoglobin A1C: 5.5 % (ref 4.0–5.6)

## 2019-05-17 NOTE — Patient Instructions (Addendum)
The lesions on your posterior shoulder is a sebaceous cyst, it can also be caused an epidermal cyst if it is closer to the top layer of the skin.    Epidermal Cyst  An epidermal cyst is a small, painless lump under your skin. The cyst contains a grayish-white, bad-smelling substance (keratin). Do not try to pop or open an epidermal cyst yourself. What are the causes?  A blocked hair follicle.  A hair that curls and re-enters the skin instead of growing straight out of the skin.  A blocked pore.  Irritated skin.  An injury to the skin.  Certain conditions that are passed along from parent to child (inherited).  Human papillomavirus (HPV).  Long-term sun damage to the skin. What increases the risk?  Having acne.  Being overweight.  Being 59-78 years old. What are the signs or symptoms? These cysts are usually harmless, but they can get infected. Symptoms of infection may include:  Redness.  Inflammation.  Tenderness.  Warmth.  Fever.  A grayish-white, bad-smelling substance drains from the cyst.  Pus drains from the cyst. How is this treated? In many cases, epidermal cysts go away on their own without treatment. If a cyst becomes infected, treatment may include:  Opening and draining the cyst, done by a doctor. After draining, you may need minor surgery to remove the rest of the cyst.  Antibiotic medicine.  Shots of medicines (steroids) that help to reduce inflammation.  Surgery to remove the cyst. Surgery may be done if the cyst: ? Becomes large. ? Bothers you. ? Has a chance of turning into cancer.  Do not try to open a cyst yourself. Follow these instructions at home:  Take over-the-counter and prescription medicines only as told by your doctor.  If you were prescribed an antibiotic medicine, take it it as told by your doctor. Do not stop using the antibiotic even if you start to feel better.  Keep the area around your cyst clean and dry.  Wear  loose, dry clothing.  Avoid touching your cyst.  Check your cyst every day for signs of infection. Check for: ? Redness, swelling, or pain. ? Fluid or blood. ? Warmth. ? Pus or a bad smell.  Keep all follow-up visits as told by your doctor. This is important. How is this prevented?  Wear clean, dry, clothing.  Avoid wearing tight clothing.  Keep your skin clean and dry. Take showers or baths every day. Contact a doctor if:  Your cyst has symptoms of infection.  Your condition does not improve or gets worse.  You have a cyst that looks different from other cysts you have had.  You have a fever. Get help right away if:  Redness spreads from the cyst into the area close by. Summary  An epidermal cyst is a sac made of skin tissue.  If a cyst becomes infected, treatment may include surgery to open and drain the cyst, or to remove it.  Take over-the-counter and prescription medicines only as told by your doctor.  Contact a doctor if your condition is not improving or is getting worse.  Keep all follow-up visits as told by your doctor. This is important. This information is not intended to replace advice given to you by your health care provider. Make sure you discuss any questions you have with your health care provider. Document Revised: 08/04/2018 Document Reviewed: 01/20/2018 Elsevier Patient Education  2020 [employer].

## 2019-05-17 NOTE — Progress Notes (Signed)
Established Patient Office Visit  Subjective:  Patient ID: [place] Kaufman, female    DOB: Jun 23, 1940  Age: 79 y.o. MRN: [id number]  CC:  Chief Complaint  Patient presents with  . uti sxs    HPI [place] Kaufman presents for recurrent UTIs.  I last saw her in October and at that point she had had her third UTI in 4 months.  They have been treated locally in [place] where she lives so I did not have any documentation of these treatments or cultures.  She did feel like the second UTI was from not completing her antibiotics. She finished her nitrofurantoin last time.  She says she did start the Estrace cream for a couple of weeks but then stopped using it bc she was better but she started having burning again.  She denies any change in urine or dysuria or hematuria.    She has a lump on the posterior right shoulder blade. It was as large as a quarter but has gotten smaller.  She also had pain into her axilla on that side as well. That last for about 2 months and then seem to move into her breast tissue.  She has hx of BrCa and has a f/u appt with her oncology.  Last mammo was in October.   She says the soreness in the axilla has also improved significantly she says it still just a little bit there but she has been using Aspercreme and feels like that is actually been helping a little bit.  He also lost some weight when I last saw her but had also had some teeth extracted and so was not eating quite as normally but she was very concerned about the 5 pound weight loss.  He says she feels like her depression is getting a lot better.  Still has some high anxiety levels but feels like the low mood is improved.  She lives alone so just has days where she feels a little bit more down.  And she has not had a lot of family coming to visit since Covid started.  He says she also notes that occasionally she will notice a little bump or rash in the groin area when she is been eating a lot of candy and  sugar.  Past Medical History:  Diagnosis Date  . Breast cancer [facility])     Past Surgical History:  Procedure Laterality Date  . BREAST BIOPSY    . MASTECTOMY    . MASTECTOMY W/ SENTINEL NODE BIOPSY     Right. 18 years ago.      Family History  Problem Relation Age of Onset  . Heart disease Mother   . Anemia Mother        penicous anemia.   . Alzheimer's disease Mother     Social History   Socioeconomic History  . Marital status: Single    Spouse name: Not on file  . Number of children: Not on file  . Years of education: Not on file  . Highest education level: Not on file  Occupational History  . Not on file  Tobacco Use  . Smoking status: Never Smoker  . Smokeless tobacco: Never Used  Substance and Sexual Activity  . Alcohol use: Never    Comment: [place].    . Drug use: Never  . Sexual activity: Not Currently  Other Topics Concern  . Not on file  Social History Narrative   Lives in [place] but stays with her daughter 1-2 weeks  out of the month.     Social Determinants of Health   Financial Resource Strain:   . Difficulty of Paying Living Expenses: Not on file  Food Insecurity:   . Worried About Charity fundraiser in the Last Year: Not on file  . Ran Out of Food in the Last Year: Not on file  Transportation Needs:   . Lack of Transportation (Medical): Not on file  . Lack of Transportation (Non-Medical): Not on file  Physical Activity:   . Days of Exercise per Week: Not on file  . Minutes of Exercise per Session: Not on file  Stress:   . Feeling of Stress : Not on file  Social Connections:   . Frequency of Communication with Friends and Family: Not on file  . Frequency of Social Gatherings with Friends and Family: Not on file  . Attends Religious Services: Not on file  . Active Member of Clubs or Organizations: Not on file  . Attends Archivist Meetings: Not on file  . Marital Status: Not on file  Intimate Partner Violence:   . Fear of Current or  Ex-Partner: Not on file  . Emotionally Abused: Not on file  . Physically Abused: Not on file  . Sexually Abused: Not on file    Outpatient Medications Prior to Visit  Medication Sig Dispense Refill  . Calcium Citrate (CITRACAL PO) Take by mouth.    . estradiol (ESTRACE VAGINAL) 0.1 MG/GM vaginal cream 1 external application around vagina and urethra twice a week. 42.5 g 2  . Multiple Vitamins-Minerals (CENTRUM SILVER 50+WOMEN PO) Take 1 tablet by mouth daily.    . nitrofurantoin, macrocrystal-monohydrate, (MACROBID) 100 MG capsule Take 1 capsule (100 mg total) by mouth 2 (two) times daily. 10 capsule 0   No facility-administered medications prior to visit.    Allergies  Allergen Reactions  . Atorvastatin     Other reaction(s): Other (comments) Side effects  . Clavulanic Acid Hives  . Tamoxifen Nausea Only    ROS Review of Systems    Objective:    Physical Exam  Constitutional: She is oriented to person, place, and time. She appears well-developed and well-nourished.  HENT:  Head: Normocephalic and atraumatic.  Pulmonary/Chest: Effort normal.  Neurological: She is alert and oriented to person, place, and time.  Skin: Skin is warm and dry.  Is a quarter size sebaceous cyst on the posterior right shoulder.  And she has a small approximately 1 cm sebaceous cyst at the base of the left side of the neck going towards the shoulder.  Psychiatric: She has a normal mood and affect. Her behavior is normal.    BP 132/67   Pulse 73   Ht '5\' 6"'$  (1.676 m)   Wt 133 lb (60.3 kg)   SpO2 99%   BMI 21.47 kg/m  Wt Readings from Last 3 Encounters:  05/17/19 133 lb (60.3 kg)  02/22/19 136 lb (61.7 kg)  04/26/18 146 lb (66.2 kg)    There are no preventive care reminders to display for this patient.  There are no preventive care reminders to display for this patient.  Lab Results  Component Value Date   TSH 1.76 02/22/2019   Lab Results  Component Value Date   WBC 8.0  02/22/2019   HGB 15.3 02/22/2019   HCT 46.2 (H) 02/22/2019   MCV 91.7 02/22/2019   PLT 261 02/22/2019   Lab Results  Component Value Date   NA 140 02/22/2019   K  4.4 02/22/2019   CO2 28 02/22/2019   GLUCOSE 93 02/22/2019   BUN 22 02/22/2019   CREATININE 0.97 (H) 02/22/2019   BILITOT 1.3 (H) 02/22/2019   ALKPHOS 75 09/01/2017   AST 17 02/22/2019   ALT 8 02/22/2019   PROT 6.7 02/22/2019   ALBUMIN 3.8 06/30/2007   CALCIUM 10.3 02/22/2019   Lab Results  Component Value Date   CHOL 227 (A) 09/01/2017   Lab Results  Component Value Date   HDL 70 09/01/2017   Lab Results  Component Value Date   LDLCALC 135 09/01/2017   Lab Results  Component Value Date   TRIG 111 09/01/2017   No results found for: CHOLHDL Lab Results  Component Value Date   HGBA1C 5.5 05/17/2019      Assessment & Plan:   Problem List Items Addressed This Visit      Genitourinary   Recurrent UTI    I did encourage her to go ahead and restart the Estrace cream just twice a week.  She still has plenty left in her tube so this should really last her a couple of months.      Relevant Orders   POCT URINALYSIS DIP (CLINITEK) (Completed)   POCT glycosylated hemoglobin (Hb A1C) (Completed)   Urine Culture    Other Visit Diagnoses    Dysuria    -  Primary   Relevant Orders   POCT URINALYSIS DIP (CLINITEK) (Completed)   Urine Culture   Sebaceous cyst       Axillary pain, right       Abnormal weight loss       Abnormal glucose       Relevant Orders   POCT glycosylated hemoglobin (Hb A1C) (Completed)     Dysuria-urinalysis looks okay but we will send for culture decide whether or not we need to put her on another round of antibiotics.  Again I really want her to try a topical estrogen cream to see if this reduces her frequency of UTIs.  Right axillary pain-suspect that it may have come originally from the inflammation of sebaceous cyst.  Which sounds like it started first and then she started to  get right axillary pain.  It has been gradually getting better as the sebaceous cyst has been getting a little smaller so I think the 2 are correlated but she does have an appointment coming up with her oncologist just for further evaluation.  Okay to continue the Aspercreme since it does seem to help.  Sebaceous cyst-discussed diagnosis she has 1 larger 1 about the size of a nickel on her right upper shoulder and a smaller one closer to the left side of her lower neck that is about the size of a centimeter or less.  She also mentioned having occasional bump or rash in the groin when she eats a lot of sugary foods we will test her for diabetes.  A1c was normal today.  Did encourage her to really cut back on her high sugar intake.  Abnormal weight loss-again just really encouraged her to eat more regularly throughout the day and not skip meals or go long periods without eating.  She says when she is down here with family she does better but when she is at home by herself she often times will not eat.  No orders of the defined types were placed in this encounter.   Follow-up: Return in about 4 months (around 09/14/2019).    [name], MD

## 2019-05-17 NOTE — Assessment & Plan Note (Signed)
I did encourage her to go ahead and restart the Estrace cream just twice a week.  She still has plenty left in her tube so this should really last her a couple of months.

## 2019-05-18 ENCOUNTER — Encounter: Payer: BLUE CROSS/BLUE SHIELD | Attending: Internal Medicine | Primary: Family Medicine

## 2019-05-18 ENCOUNTER — Ambulatory Visit: Payer: Medicare Other | Admitting: Family Medicine

## 2019-05-19 LAB — URINE CULTURE
MICRO NUMBER:: 10063771
SPECIMEN QUALITY:: ADEQUATE

## 2019-05-23 ENCOUNTER — Encounter: Payer: MEDICARE | Attending: Foot and Ankle Surgery | Primary: Family Medicine

## 2019-05-26 ENCOUNTER — Ambulatory Visit: Attending: Internal Medicine | Primary: Family Medicine

## 2019-05-26 ENCOUNTER — Ambulatory Visit: Admit: 2019-05-26 | Discharge: 2019-05-26 | Payer: MEDICARE | Attending: Internal Medicine | Primary: Family Medicine

## 2019-05-26 DIAGNOSIS — Z853 Personal history of malignant neoplasm of breast: Secondary | ICD-10-CM

## 2019-05-26 NOTE — Progress Notes (Signed)
Hematology/Oncology Consult Note    Name: Jamie [name]  Date: 05/26/2019  DOB: 03/26/1941      Primary Care Provider: Dr. Lieutenant Diego  Jamie [name] is a 79 y.o. year old female with a history of breast cancer and uterine cancer.     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 She also has a history of diverticulosis, GERD and anxiety. She was originally diagnosed with Stage IIA breast cancer in July 1995, lymph node positive ER positive, s/p right mastectomy and completed 2 months of Tamoxifen. She followed up for many years but then missed many appointments with Dr. Nadyne Coombes.  She reports that since  she has been spending a lot of time with her family in [place]. Due to  her history of both breast cancer and uterine cancer she decided to come here for further assessment to rule out any evidence of recurrent or metastatic disease.     Today she reported she recently f/u with PCP for sebaceous cyst on right shoulder which has been better.   She denied any infectious signs or symptoms as fever, chills, drainage, or open wound. She looks very anxious today. The patient otherwise has no other complaints. Denied night sweat, unintentional weight loss, skin lumps or bumps, acute bleeding or bruising issues. Denied headache, acute vision change, dizziness, chest pain, worsen shortness of breath, palpitation, productive cough, nausea, vomiting, abdominal pain, altered bowel habits, dysuria, new bone pain or back pain, focal numbness or weakness.        Past medical history, family history, and social history were reviewed and are unchanged.    Past Medical History   Diagnosis Date   ??? Diverticulosis    ??? Hepatomegaly    ??? GERD (gastroesophageal reflux disease)    ??? IBS (irritable bowel syndrome)    ??? Allergic rhinitis    ??? Cancer      breast    ??? Colon polyps     Urinary incontinence    Past Surgical History   Procedure Laterality Date   ??? Endoscopy, colon, diagnostic     ??? Hx hysterectomy     ??? Hx mastectomy and axillary lymph node dissection       modified right   ??? Hx tonsillectomy     ??? Hx wisdom teeth extraction       History     Social History   ??? Marital Status: divorced     Spouse Name: N/A     Number of Children: N/A   ??? Years of Education: N/A     Occupational History   ??? Works as a Scientist, clinical (histocompatibility and immunogenetics) at [employer]   ??? Smoking status: Former Smoker   ??? Smokeless tobacco: Never Used   ??? Alcohol Use: No   ??? Drug Use: No   ??? Sexually Active:      Other Topics Concern   ??? Not on file     Social History Narrative   ??? 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 COPD Mother    ??? Cancer Father  lung   ??? Elevated Lipids Brother    ??? Other Mother      hyperthyroidism   ??? Coronary Artery Disease Mother    ??? Hypertension Mother      Current Outpatient Prescriptions   Medication Sig Dispense Refill   ??? omeprazole (PRILOSEC) 40 mg capsule Take 1 Cap by mouth daily.  30 Cap  3   ??? Caltrate +D        No current facility-administered medications for this visit.       Review of Systems   Constitutional: Negative for chills, diaphoresis, fever, malaise/fatigue and weight loss.   Respiratory: Negative for cough, hemoptysis, shortness of breath and wheezing.         Right axillary pain   Cardiovascular: Negative for chest pain, palpitations and leg swelling.   Gastrointestinal: Negative for abdominal pain, diarrhea, heartburn, nausea and vomiting.   Genitourinary: Negative for dysuria, frequency, hematuria and urgency.   Musculoskeletal: Negative for joint pain and myalgias.   Skin: Negative for itching and rash.   Neurological: Negative for dizziness, seizures, weakness and headaches.    Psychiatric/Behavioral: Negative for depression. The patient does not have insomnia.             Objective:     Visit Vitals  BP 133/71   Pulse 80   Temp 98.6 ??F (37 ??C)   Ht 5\' 6"  (1.676 m)   Wt 59 kg (130 lb)   SpO2 99%   BMI 20.98 kg/m??       ECOG Performance Status (grade): 0  0 - able to carry on all pre-disease activity w/out restriction  1 - restricted but able to carry out light work  2 - ambulatory and can self- care but unable to carry out work  3 - bed or chair >50% of waking hours  4 - completely disable, total care, confined to bed or chair    Physical Exam  Constitutional:       Appearance: Normal appearance.   HENT:      Head: Normocephalic and atraumatic.   Eyes:      Pupils: Pupils are equal, round, and reactive to light.   Neck:      Musculoskeletal: Neck supple.   Cardiovascular:      Rate and Rhythm: Normal rate and regular rhythm.      Heart sounds: Normal heart sounds.   Pulmonary:      Effort: Pulmonary effort is normal.      Breath sounds: Normal breath sounds.   Abdominal:      General: Bowel sounds are normal.      Palpations: Abdomen is soft.      Tenderness: There is no abdominal tenderness. There is no guarding.   Musculoskeletal: Normal range of motion.      Right lower leg: No edema.      Left lower leg: No edema.   Skin:     General: Skin is warm.   Neurological:      General: No focal deficit present.      Mental Status: She is alert and oriented to person, place, and time. Mental status is at baseline.      Breast exam  + Left: No discoloration, nipple inversion or retractions. No axillary or supraclavicular lymphadenopathy. No mass.  + Right: No discoloration, nipple inversion or retractions. No axillary or supraclavicular lymphadenopathy. No mass.      Diagnostics:      No results found for this or any previous  visit (from the past 96 hour(s)).    Imaging:  No results found for this or any previous visit.    Results for orders placed during the hospital encounter of 11/30/16    XR ANKLE LT AP/LAT    Narrative  Left ankle AP, lateral views and oblique view     CPT CODE: 60630    HISTORY: Left ankle pain    COMPARISON: None.    FINDINGS: The bony structures about the ankle are intact. There is no evidence  of acute fracture or dislocation identified. The mortise appears normal. Small  calcaneal spur present. No soft tissue injury appreciated.       Impression IMPRESSION:    No acute bony abnormality.    Thank you for your referral.        Results for orders placed in visit on 02/28/19   CT CHEST ABD PELV W CONT    Narrative CT chest, abdomen and pelvis with contrast     CLINICAL HISTORY: History of breast cancer and uterine malignancy    COMPARISON: CT 05/20/18.    TECHNIQUE: Helical scan to the chest, abdomen and pelvis are obtained from the  thoracic inlet to the symphysis pubis after IV contrast administration.    All CT scans at this facility performed using dose optimization techniques as  appreciated to a performed exam, to include automated exposure control,  adjustment of the mA and or KU according to patient size (including appropriate  matching for site specific examination), or use of iterative reconstruction  technique.    FINDINGS:     CT CHEST:     Lung Parenchyma: Chronic atelectasis in posterior bilateral lower lobes appear  stable. Parenchymal scarring also seen with superimposed mild emphysema. No new  or suspicious pulmonary lesion seen. Extensive calcified bronchial wall  calcifications with patent airway..    Thyroid: Unremarkable.    Mediastinum: No adenopathy.     Heart: The heart and the pericardium appear normal.    Vasculature: The aorta and the great vessels are unremarkable.     Pleural Space And Chest Wall: No significant pleural pathology. There is likely  right mastectomy. Surgical staples again noted from prior right axillary node  resection. The 7 mm lymph node in the high right axilla is again noted. The   triangular shaped confluent soft tissue density in subpectoral fat pad appears  stable as well.    Pleural      CT ABDOMEN AND PELVIS:    Liver: Normal.    Gallbladder: There is questionable nodular gallbladder wall thickening versus  volume averaging artifact at the anterior and posterior fundus. Recommend  gallbladder ultrasound for better evaluation.    Biliary System: No ductal dilatation.    Spleen: Normal.    Pancreas: Normal.    Adrenal Glands: Normal.    Kidneys: Normal.    Bowel: The small and large bowel are nondilated.  Normal appendix. Multiple  diverticula in distal colon with poor sigmoid distention, stable. No acute bowel  inflammation. Large fecal ball in the rectum.    Lower genitourinary system: The bladder is suboptimally distended. The uterus is  not clearly seen, assuming surgically removed. There is flat fluid-filled  structure between the bladder and the rectum as likely fluid-filled vaginal  about. No discrete mass or mass effect seen.  No adnexal abnormality.     Peritoneum/Retroperitoneum: No adenopathy.    Vasculature: Moderate atherosclerotic aortic disease.     Other: No free fluid.  CT OSSEOUS STRUCTURES:      Mild osteopenia. No suspicious bony lesion seen.      Impression IMPRESSION:     1. The small subcentimeter right axillary lymph node appears stable. The  confluent density in right peripectoral fat pad appears unchanged and remains  indeterminate for etiology such as postop changes for scarring.    2. No adenopathy or new finding to suggest metastasis.    3. Chronic bronchitis and chronic bibasilar atelectasis.    4. Other incidental findings as above.    Thank you for this referral.            Assessment:     1. History of breast cancer    2. History of right mastectomy    3. History of uterine cancer          Plan:   # History of right breast cancer and right modified radical mastectomy:   -- The patient has history of both breast cancer and uterine cancer.   -- On 05/20/2018 a CT scan of the chest, abdomen, and pelvis was completed.  This test did not reveal any definite metastatic disease.  There was a confluent density in the right axilla.  The patient has previously had a right modified radical mastectomy and axillary lymph node dissection so it was concerned that this represents scarring from her previous axillary dissection.  There were no other findings to suggest any type of metastatic disease throughout the chest, abdomen, and pelvis.  -- She reported she recently f/u with PCP for sebaceous cyst on right shoulder which has been better.   -- 03/14/2019 CT reported the small subcentimeter right axillary lymph node appears stable. The confluent density in right peripectoral fat pad appears unchanged and remains indeterminate for etiology such as postop changes for scarring.  No adenopathy or new finding to suggest metastasis.  -- Clinically the patient is doing well and has no evidence of disease recurrence. Recent CT report was reviewed with patient today.      Plan:    -- The patient was instructed to continue doing her breast exams on a monthly basis.  -- The patient was advised to notify us if any breast pain, lumps or bumps, breast skin changes, unintentional weight loss, worsen fatigue, new bone pain or back pain, or any concerns.  -- I have advised her to follow up her PCP to continue age-appropriate cancer screening.  -- I have educated her regarding lifestyle modifications, minimizing alcohol intake, refraining from smoking, healthy diet, and physical activity.  -- Her next mammogram will be due on 01/2020        # History of uterine cancer, s/p hysterectomy:   -- 04/2018 CT scans of the abdomen pelvis was negative for any evidence of recurrent disease.  -- I have advised the patient to follow-up her GYN.          -- We will see the patient back in clinic in about 6 months for follow-up. Always sooner if required.         No orders of the defined types were placed in this encounter.          Ms. Badua has a reminder for a "due or due soon" health maintenance. I have asked that she contact her primary care provider for follow-up on this health maintenance.   All of patient's questions answered to their apparent satisfaction. They verbally show understanding and agreement with aforementioned plan.  Ena Dawley Braydan Marriott, MD  05/26/2019          About 25 minutes were spent for this encounter with more than 50% of the time spent in face-to-face counseling, discussing on diagnosis and management plan going forward, and co-ordination of care.  Parts of this document has been produced using Dragon dictation system. Unrecognized errors in transcription may be present. Please Jarvis Knodel not hesitate to reach out for any questions or clarifications.      CC: Leo Rod, MD

## 2019-05-26 NOTE — Progress Notes (Signed)
Hematology/Oncology Consult Note    Name: Jamie [name]  Date: 05/26/2019  DOB: 10-02-1940      Primary Care Provider: Dr. Lieutenant Diego  Jamie [name] is a 79 y.o. year old female with a history of breast cancer and uterine cancer.     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 She also has a history of diverticulosis, GERD and anxiety. She was originally diagnosed with Stage IIA breast cancer in July 1995, lymph node positive ER positive, s/p right mastectomy and completed 2 months of Tamoxifen. She followed up for many years but then missed many appointments with Dr. Nadyne Coombes.  She reports that since  she has been spending a lot of time with her family in [place]. Due to  her history of both breast cancer and uterine cancer she decided to come here for further assessment to rule out any evidence of recurrent or metastatic disease.     Today she reported she recently f/u with PCP for sebaceous cyst on right shoulder which has been better.   She denied any infectious signs or symptoms as fever, chills, drainage, or open wound. She looks very anxious today. The patient otherwise has no other complaints. Denied night sweat, unintentional weight loss, skin lumps or bumps, acute bleeding or bruising issues. Denied headache, acute vision change, dizziness, chest pain, worsen shortness of breath, palpitation, productive cough, nausea, vomiting, abdominal pain, altered bowel habits, dysuria, new bone pain or back pain, focal numbness or weakness.        Past medical history, family history, and social history were reviewed and are unchanged.    Past Medical History   Diagnosis Date   ??? Diverticulosis    ??? Hepatomegaly    ??? GERD (gastroesophageal reflux disease)    ??? IBS (irritable bowel syndrome)    ??? Allergic rhinitis    ??? Cancer      breast   ??? Colon polyps      Urinary incontinence    Past Surgical History   Procedure Laterality Date   ??? Endoscopy, colon, diagnostic     ??? Hx hysterectomy     ??? Hx mastectomy and axillary lymph node dissection       modified right   ??? Hx tonsillectomy     ??? Hx wisdom teeth extraction       History     Social History   ??? Marital Status: divorced     Spouse Name: N/A     Number of Children: N/A   ??? Years of Education: N/A     Occupational History   ??? Works as a Scientist, clinical (histocompatibility and immunogenetics) at [employer]   ??? Smoking status: Former Smoker   ??? Smokeless tobacco: Never Used   ??? Alcohol Use: No   ??? Drug Use: No   ??? Sexually Active:      Other Topics Concern   ??? Not on file     Social History Narrative   ??? 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 COPD Mother    ??? Cancer Father  lung   ??? Elevated Lipids Brother    ??? Other Mother      hyperthyroidism   ??? Coronary Artery Disease Mother    ??? Hypertension Mother      Current Outpatient Prescriptions   Medication Sig Dispense Refill   ??? omeprazole (PRILOSEC) 40 mg capsule Take 1 Cap by mouth daily.  30 Cap  3   ??? Caltrate +D        No current facility-administered medications for this visit.       Review of Systems   Constitutional: Negative for chills, diaphoresis, fever, malaise/fatigue and weight loss.   Respiratory: Negative for cough, hemoptysis, shortness of breath and wheezing.         Right axillary pain   Cardiovascular: Negative for chest pain, palpitations and leg swelling.   Gastrointestinal: Negative for abdominal pain, diarrhea, heartburn, nausea and vomiting.   Genitourinary: Negative for dysuria, frequency, hematuria and urgency.   Musculoskeletal: Negative for joint pain and myalgias.   Skin: Negative for itching and rash.   Neurological: Negative for dizziness, seizures, weakness and headaches.   Psychiatric/Behavioral: Negative for depression. The patient does not have  insomnia.             Objective:     Visit Vitals  BP 133/71   Pulse 80   Temp 98.6 ??F (37 ??C)   Ht 5\' 6"  (1.676 m)   Wt 59 kg (130 lb)   SpO2 99%   BMI 20.98 kg/m??       ECOG Performance Status (grade): 0  0 - able to carry on all pre-disease activity w/out restriction  1 - restricted but able to carry out light work  2 - ambulatory and can self- care but unable to carry out work  3 - bed or chair >50% of waking hours  4 - completely disable, total care, confined to bed or chair    Physical Exam  Constitutional:       Appearance: Normal appearance.   HENT:      Head: Normocephalic and atraumatic.   Eyes:      Pupils: Pupils are equal, round, and reactive to light.   Neck:      Musculoskeletal: Neck supple.   Cardiovascular:      Rate and Rhythm: Normal rate and regular rhythm.      Heart sounds: Normal heart sounds.   Pulmonary:      Effort: Pulmonary effort is normal.      Breath sounds: Normal breath sounds.   Abdominal:      General: Bowel sounds are normal.      Palpations: Abdomen is soft.      Tenderness: There is no abdominal tenderness. There is no guarding.   Musculoskeletal: Normal range of motion.      Right lower leg: No edema.      Left lower leg: No edema.   Skin:     General: Skin is warm.   Neurological:      General: No focal deficit present.      Mental Status: She is alert and oriented to person, place, and time. Mental status is at baseline.      Breast exam  + Left: No discoloration, nipple inversion or retractions. No axillary or supraclavicular lymphadenopathy. No mass.  + Right: No discoloration, nipple inversion or retractions. No axillary or supraclavicular lymphadenopathy. No mass.      Diagnostics:      No results found for this or any previous  visit (from the past 96 hour(s)).    Imaging:  No results found for this or any previous visit.    Results for orders placed during the hospital encounter of 11/30/16   XR ANKLE LT AP/LAT    Narrative  Left ankle AP, lateral views and oblique view      CPT CODE: 60454    HISTORY: Left ankle pain    COMPARISON: None.    FINDINGS: The bony structures about the ankle are intact. There is no evidence  of acute fracture or dislocation identified. The mortise appears normal. Small  calcaneal spur present. No soft tissue injury appreciated.       Impression IMPRESSION:    No acute bony abnormality.    Thank you for your referral.        Results for orders placed in visit on 02/28/19   CT CHEST ABD PELV W CONT    Narrative CT chest, abdomen and pelvis with contrast     CLINICAL HISTORY: History of breast cancer and uterine malignancy    COMPARISON: CT 05/20/18.    TECHNIQUE: Helical scan to the chest, abdomen and pelvis are obtained from the  thoracic inlet to the symphysis pubis after IV contrast administration.    All CT scans at this facility performed using dose optimization techniques as  appreciated to a performed exam, to include automated exposure control,  adjustment of the mA and or KU according to patient size (including appropriate  matching for site specific examination), or use of iterative reconstruction  technique.    FINDINGS:     CT CHEST:     Lung Parenchyma: Chronic atelectasis in posterior bilateral lower lobes appear  stable. Parenchymal scarring also seen with superimposed mild emphysema. No new  or suspicious pulmonary lesion seen. Extensive calcified bronchial wall  calcifications with patent airway..    Thyroid: Unremarkable.    Mediastinum: No adenopathy.     Heart: The heart and the pericardium appear normal.    Vasculature: The aorta and the great vessels are unremarkable.     Pleural Space And Chest Wall: No significant pleural pathology. There is likely  right mastectomy. Surgical staples again noted from prior right axillary node  resection. The 7 mm lymph node in the high right axilla is again noted. The  triangular shaped confluent soft tissue density in subpectoral fat pad appears  stable as well.    Pleural      CT ABDOMEN AND  PELVIS:    Liver: Normal.    Gallbladder: There is questionable nodular gallbladder wall thickening versus  volume averaging artifact at the anterior and posterior fundus. Recommend  gallbladder ultrasound for better evaluation.    Biliary System: No ductal dilatation.    Spleen: Normal.    Pancreas: Normal.    Adrenal Glands: Normal.    Kidneys: Normal.    Bowel: The small and large bowel are nondilated.  Normal appendix. Multiple  diverticula in distal colon with poor sigmoid distention, stable. No acute bowel  inflammation. Large fecal ball in the rectum.    Lower genitourinary system: The bladder is suboptimally distended. The uterus is  not clearly seen, assuming surgically removed. There is flat fluid-filled  structure between the bladder and the rectum as likely fluid-filled vaginal  about. No discrete mass or mass effect seen.  No adnexal abnormality.     Peritoneum/Retroperitoneum: No adenopathy.    Vasculature: Moderate atherosclerotic aortic disease.     Other: No free fluid.  CT OSSEOUS STRUCTURES:      Mild osteopenia. No suspicious bony lesion seen.      Impression IMPRESSION:     1. The small subcentimeter right axillary lymph node appears stable. The  confluent density in right peripectoral fat pad appears unchanged and remains  indeterminate for etiology such as postop changes for scarring.    2. No adenopathy or new finding to suggest metastasis.    3. Chronic bronchitis and chronic bibasilar atelectasis.    4. Other incidental findings as above.    Thank you for this referral.            Assessment:     1. History of breast cancer    2. History of right mastectomy    3. History of uterine cancer          Plan:   # History of right breast cancer and right modified radical mastectomy:   -- The patient has history of both breast cancer and uterine cancer.  -- On 05/20/2018 a CT scan of the chest, abdomen, and pelvis was completed.  This test did not reveal any definite metastatic disease.  There was  a confluent density in the right axilla.  The patient has previously had a right modified radical mastectomy and axillary lymph node dissection so it was concerned that this represents scarring from her previous axillary dissection.  There were no other findings to suggest any type of metastatic disease throughout the chest, abdomen, and pelvis.  -- She reported she recently f/u with PCP for sebaceous cyst on right shoulder which has been better.   -- 03/14/2019 CT reported the small subcentimeter right axillary lymph node appears stable. The confluent density in right peripectoral fat pad appears unchanged and remains indeterminate for etiology such as postop changes for scarring.  No adenopathy or new finding to suggest metastasis.  -- Clinically the patient is doing well and has no evidence of disease recurrence. Recent CT report was reviewed with patient today.      Plan:    -- The patient was instructed to continue doing her breast exams on a monthly basis.  -- The patient was advised to notify us if any breast pain, lumps or bumps, breast skin changes, unintentional weight loss, worsen fatigue, new bone pain or back pain, or any concerns.  -- I have advised her to follow up her PCP to continue age-appropriate cancer screening.  -- I have educated her regarding lifestyle modifications, minimizing alcohol intake, refraining from smoking, healthy diet, and physical activity.  -- Her next mammogram will be due on 01/2020        # History of uterine cancer, s/p hysterectomy:   -- 04/2018 CT scans of the abdomen pelvis was negative for any evidence of recurrent disease.  -- I have advised the patient to follow-up her GYN.          -- We will see the patient back in clinic in about 6 months for follow-up. Always sooner if required.        No orders of the defined types were placed in this encounter.          Ms. Heckendorf has a reminder for a "due or due soon" health maintenance. I have asked that she contact her primary  care provider for follow-up on this health maintenance.   All of patient's questions answered to their apparent satisfaction. They verbally show understanding and agreement with aforementioned plan.  Ena Dawley Vishruth Seoane, MD  05/26/2019          About 25 minutes were spent for this encounter with more than 50% of the time spent in face-to-face counseling, discussing on diagnosis and management plan going forward, and co-ordination of care.  Parts of this document has been produced using Dragon dictation system. Unrecognized errors in transcription may be present. Please Terryl Niziolek not hesitate to reach out for any questions or clarifications.      CC: Leo Rod, MD

## 2019-05-29 ENCOUNTER — Telehealth: Payer: Self-pay

## 2019-05-29 NOTE — Telephone Encounter (Signed)
Patient advised.

## 2019-05-29 NOTE — Telephone Encounter (Signed)
A1C was normal at 5.5.

## 2019-05-29 NOTE — Telephone Encounter (Signed)
[place] is wanting to know about the HGBA1C. Please advise.

## 2019-06-21 ENCOUNTER — Ambulatory Visit: Attending: Family Medicine | Primary: Family Medicine

## 2019-06-21 ENCOUNTER — Encounter

## 2019-06-21 ENCOUNTER — Ambulatory Visit: Admit: 2019-06-21 | Discharge: 2019-06-21 | Payer: MEDICARE | Attending: Family Medicine | Primary: Family Medicine

## 2019-06-21 ENCOUNTER — Inpatient Hospital Stay: Admit: 2019-06-21 | Payer: MEDICARE | Primary: Family Medicine

## 2019-06-21 DIAGNOSIS — M2550 Pain in unspecified joint: Secondary | ICD-10-CM

## 2019-06-21 DIAGNOSIS — Z1231 Encounter for screening mammogram for malignant neoplasm of breast: Secondary | ICD-10-CM

## 2019-06-21 LAB — CBC WITH AUTOMATED DIFF
ABS. BASOPHILS: 0 10*3/uL (ref 0.0–0.1)
ABS. EOSINOPHILS: 0.1 10*3/uL (ref 0.0–0.4)
ABS. LYMPHOCYTES: 1.1 10*3/uL (ref 0.9–3.6)
ABS. MONOCYTES: 0.6 10*3/uL (ref 0.05–1.2)
ABS. NEUTROPHILS: 5 10*3/uL (ref 1.8–8.0)
BASOPHILS: 1 % (ref 0–2)
EOSINOPHILS: 2 % (ref 0–5)
HCT: 47.1 % — ABNORMAL HIGH (ref 35.0–45.0)
HGB: 15.5 g/dL (ref 12.0–16.0)
LYMPHOCYTES: 16 % — ABNORMAL LOW (ref 21–52)
MCH: 30.4 PG (ref 24.0–34.0)
MCHC: 32.9 g/dL (ref 31.0–37.0)
MCV: 92.4 FL (ref 74.0–97.0)
MONOCYTES: 9 % (ref 3–10)
MPV: 11.1 FL (ref 9.2–11.8)
NEUTROPHILS: 72 % (ref 40–73)
PLATELET: 277 10*3/uL (ref 135–420)
RBC: 5.1 M/uL (ref 4.20–5.30)
RDW: 14.2 % (ref 11.6–14.5)
WBC: 6.9 10*3/uL (ref 4.6–13.2)

## 2019-06-21 LAB — LIPID PANEL
CHOL/HDL Ratio: 2.8 (ref 0–5.0)
Chol/HDL Ratio: 2.8 (ref 0–5.0)
Cholesterol, Total: 229 MG/DL — ABNORMAL HIGH (ref <200)
Cholesterol, total: 229 MG/DL — ABNORMAL HIGH (ref <200)
HDL Cholesterol: 81 MG/DL — ABNORMAL HIGH (ref 40–60)
HDL: 81 MG/DL — ABNORMAL HIGH (ref 40–60)
LDL Calculated: 129.6 MG/DL — ABNORMAL HIGH (ref 0–100)
LDL, calculated: 129.6 MG/DL — ABNORMAL HIGH (ref 0–100)
Triglyceride: 92 MG/DL (ref <150)
Triglycerides: 92 MG/DL (ref <150)
VLDL Cholesterol Calculated: 18.4 MG/DL
VLDL, calculated: 18.4 MG/DL

## 2019-06-21 LAB — METABOLIC PANEL, COMPREHENSIVE
A-G Ratio: 1.3 (ref 0.8–1.7)
ALT (SGPT): 18 U/L (ref 13–56)
AST (SGOT): 17 U/L (ref 10–38)
Albumin: 4 g/dL (ref 3.4–5.0)
Alk. phosphatase: 61 U/L (ref 45–117)
Anion gap: 7 mmol/L (ref 3.0–18)
BUN/Creatinine ratio: 24 — ABNORMAL HIGH (ref 12–20)
BUN: 19 MG/DL — ABNORMAL HIGH (ref 7.0–18)
Bilirubin, total: 0.9 MG/DL (ref 0.2–1.0)
CO2: 27 mmol/L (ref 21–32)
Calcium: 10.1 MG/DL (ref 8.5–10.1)
Chloride: 107 mmol/L (ref 100–111)
Creatinine: 0.79 MG/DL (ref 0.6–1.3)
GFR est AA: >60 mL/min/{1.73_m2} (ref >60)
GFR est non-AA: >60 mL/min/{1.73_m2} (ref >60)
Globulin: 3.1 g/dL (ref 2.0–4.0)
Glucose: 85 mg/dL (ref 74–99)
Potassium: 4.4 mmol/L (ref 3.5–5.5)
Protein, total: 7.1 g/dL (ref 6.4–8.2)
Sodium: 141 mmol/L (ref 136–145)

## 2019-06-21 LAB — C REACTIVE PROTEIN, QT: C-Reactive protein: <0.3 mg/dL (ref 0–0.3)

## 2019-06-21 LAB — TSH AND FREE T4
T4, Free: 1.1 NG/DL (ref 0.7–1.5)
TSH: 2.18 u[IU]/mL (ref 0.36–3.74)

## 2019-06-21 LAB — COMPREHENSIVE METABOLIC PANEL
ALT: 18 U/L (ref 13–56)
AST: 17 U/L (ref 10–38)
Albumin/Globulin Ratio: 1.3 (ref 0.8–1.7)
Albumin: 4 g/dL (ref 3.4–5.0)
Alkaline Phosphatase: 61 U/L (ref 45–117)
Anion Gap: 7 mmol/L (ref 3.0–18)
BUN: 19 MG/DL — ABNORMAL HIGH (ref 7.0–18)
Bun/Cre Ratio: 24 — ABNORMAL HIGH (ref 12–20)
CO2: 27 mmol/L (ref 21–32)
Calcium: 10.1 MG/DL (ref 8.5–10.1)
Chloride: 107 mmol/L (ref 100–111)
Creatinine: 0.79 MG/DL (ref 0.6–1.3)
EGFR IF NonAfrican American: >60 mL/min/{1.73_m2} (ref >60)
GFR African American: >60 mL/min/{1.73_m2} (ref >60)
Globulin: 3.1 g/dL (ref 2.0–4.0)
Glucose: 85 mg/dL (ref 74–99)
Potassium: 4.4 mmol/L (ref 3.5–5.5)
Sodium: 141 mmol/L (ref 136–145)
Total Bilirubin: 0.9 MG/DL (ref 0.2–1.0)
Total Protein: 7.1 g/dL (ref 6.4–8.2)

## 2019-06-21 LAB — CBC WITH AUTO DIFFERENTIAL
Basophils %: 1 % (ref 0–2)
Basophils Absolute: 0 10*3/uL (ref 0.0–0.1)
Eosinophils %: 2 % (ref 0–5)
Eosinophils Absolute: 0.1 10*3/uL (ref 0.0–0.4)
Hematocrit: 47.1 % — ABNORMAL HIGH (ref 35.0–45.0)
Hemoglobin: 15.5 g/dL (ref 12.0–16.0)
Lymphocytes %: 16 % — ABNORMAL LOW (ref 21–52)
Lymphocytes Absolute: 1.1 10*3/uL (ref 0.9–3.6)
MCH: 30.4 PG (ref 24.0–34.0)
MCHC: 32.9 g/dL (ref 31.0–37.0)
MCV: 92.4 FL (ref 74.0–97.0)
MPV: 11.1 FL (ref 9.2–11.8)
Monocytes %: 9 % (ref 3–10)
Monocytes Absolute: 0.6 10*3/uL (ref 0.05–1.2)
Neutrophils %: 72 % (ref 40–73)
Neutrophils Absolute: 5 10*3/uL (ref 1.8–8.0)
Platelets: 277 10*3/uL (ref 135–420)
RBC: 5.1 M/uL (ref 4.20–5.30)
RDW: 14.2 % (ref 11.6–14.5)
WBC: 6.9 10*3/uL (ref 4.6–13.2)

## 2019-06-21 LAB — TSH + FREE T4 PANEL
T4 Free: 1.1 NG/DL (ref 0.7–1.5)
TSH: 2.18 u[IU]/mL (ref 0.36–3.74)

## 2019-06-21 LAB — C-REACTIVE PROTEIN: CRP: <0.3 mg/dL (ref 0–0.3)

## 2019-06-21 MED ORDER — DICLOFENAC 1 % TOPICAL GEL
1 % | Freq: Four times a day (QID) | CUTANEOUS | 11 refills | Status: AC
Start: 2019-06-21 — End: ?

## 2019-06-21 NOTE — Patient Instructions (Addendum)
Well Visit, Over 65: Care Instructions  Your Care Instructions     Physical exams can help you stay healthy. Your doctor has checked your overall health and may have suggested ways to take good care of yourself. He or she also may have recommended tests. At home, you can help prevent illness with healthy eating, regular exercise, and other steps.  Follow-up care is a key part of your treatment and safety. Be sure to make and go to all appointments, and call your doctor if you are having problems. It's also a good idea to know your test results and keep a list of the medicines you take.  How can you care for yourself at home?  ?? Reach and stay at a healthy weight. This will lower your risk for many problems, such as obesity, diabetes, heart disease, and high blood pressure.  ?? Get at least 30 minutes of exercise on most days of the week. Walking is a good choice. You also may want to do other activities, such as running, swimming, cycling, or playing tennis or team sports.  ?? Do not smoke. Smoking can make health problems worse. If you need help quitting, talk to your doctor about stop-smoking programs and medicines. These can increase your chances of quitting for good.  ?? Protect your skin from too much sun. When you're outdoors from 10 a.m. to 4 p.m., stay in the shade or cover up with clothing and a hat with a wide brim. Wear sunglasses that block UV rays. Even when it's cloudy, put broad-spectrum sunscreen (SPF 30 or higher) on any exposed skin.  ?? See a dentist one or two times a year for checkups and to have your teeth cleaned.  ?? Wear a seat belt in the car.  Follow your doctor's advice about when to have certain tests. These tests can spot problems early.  For men and women  ?? Cholesterol. Your doctor will tell you how often to have this done based on your overall health and other things that can increase your risk for heart attack and stroke.   ?? Blood pressure. Have your blood pressure checked during a routine doctor visit. Your doctor will tell you how often to check your blood pressure based on your age, your blood pressure results, and other factors.  ?? Diabetes. Ask your doctor whether you should have tests for diabetes.  ?? Vision. Experts recommend that you have yearly exams for glaucoma and other age-related eye problems.  ?? Hearing. Tell your doctor if you notice any change in your hearing. You can have tests to find out how well you hear.  ?? Colon cancer tests. Keep having colon cancer tests as your doctor recommends. You can have one of several types of tests.  ?? Heart attack and stroke risk. At least every 4 to 6 years, you should have your risk for heart attack and stroke assessed. Your doctor uses factors such as your age, blood pressure, cholesterol, and whether you smoke or have diabetes to show what your risk for a heart attack or stroke is over the next 10 years.  ?? Osteoporosis. Talk to your doctor about whether you should have a bone density test to find out whether you have thinning bones. Ask your doctor if you need to take a calcium plus vitamin D supplement. You may be able to get enough calcium and vitamin D through your diet.  For women  ?? Pap test and pelvic exam. You may no longer need   a Pap test. Talk with your doctor about whether to stop or continue to have Pap tests.  ?? Breast exam and mammogram. Ask how often you should have a mammogram, which is an X-ray of your breasts. A mammogram can spot breast cancer before it can be felt and when it is easiest to treat.  ?? Thyroid disease. Talk to your doctor about whether to have your thyroid checked as part of a regular physical exam. Women have an increased chance of a thyroid problem.  For men   ?? Prostate exam. Talk to your doctor about whether you should have a blood test (called a PSA test) for prostate cancer. Experts recommend that you discuss the benefits and risks of the test with your doctor before you decide whether to have this test. Some experts say that men ages 19 and older no longer need testing.  ?? Abdominal aortic aneurysm. Ask your doctor whether you should have a test to check for an aneurysm. You may need a test if you ever smoked or if your parent, brother, sister, or child has had an aneurysm.  When should you call for help?  Watch closely for changes in your health, and be sure to contact your doctor if you have any problems or symptoms that concern you.  Where can you learn more?  Go to http://clay[facility]-rivera.info/  Enter [phone] in the search box to learn more about "Well Visit, Over 65: Care Instructions."  Current as of: Sep 21, 2018??????????????????????????????Content Version: 12.6  ?? 2006-2020 Health[employer].   Care instructions adapted under license by [employer] (which disclaims liability or warranty for this information). If you have questions about a medical condition or this instruction, always ask your healthcare professional. [place] any warranty or liability for your use of this information.    Medicare Part B Preventive Services Limitations Recommendation Scheduled   Bone Mass Measurement  (age 77 & older, biennial) Requires diagnosis related to osteoporosis or estrogen deficiency. Biennial benefit unless patient has history of long-term glucocorticoid tx or baseline is needed because initial test was by other method This should be done at age 110 and again if on osteoporosis medication at 2-3 year intervals.  Ordered today   Cardiovascular Screening Blood Tests (every 5 years)  Total cholesterol, HDL, Triglycerides Order as a panel if possible We should check your cholesterol panel at least once every 5 years. Up to date    Colorectal Cancer Screening  -Fecal occult blood test (annual)  -Flexible sigmoidoscopy (5y)  -Screening colonoscopy (10y)  -Barium Enema  Due per your Gastroenterologist's recommendations.  Up to date   Counseling to Prevent Tobacco Use (up to 8 sessions per year)  - Counseling greater than 3 and up to 10 minutes  - Counseling greater than 10 minutes Patients must be asymptomatic of tobacco-related conditions to receive as preventive service Continue with smoking cessation Not applicable   Diabetes Screening Tests (at least every 3 years, Medicare covers annually or at 11-month intervals for prediabetic patients)    Fasting blood sugar (FBS) or glucose tolerance test (GTT) Patient must be diagnosed with one of the following:  -Hypertension, Dyslipidemia, obesity, previous impaired FBS or GTT  ???Or any two of the following: overweight, FH of diabetes, age ?43, history of gestational diabetes, birth of baby weighing more than 9 pounds Should be done yearly Up to date   Diabetes Self-Management Training (DSMT) (no USPSTF recommendation) Requires referral by treating physician for patient with diabetes or  renal disease. 10 hours of initial DSMT session of no less than 30 minutes each in a continuous 71-month period.  2 hours of follow-up DSMT in subsequent years. Not applicable Not applicable   Glaucoma Screening (no USPSTF recommendation) Diabetes mellitus, family history, African American, age 83 or over, Hispanic American, age 49 or over Continue with annual eye exams. Up to date   Human Immunodeficiency Virus (HIV) Screening (annually for increased risk patients)  HIV-1 and HIV-2 by EIA, ELISA, rapid antibody test, or oral mucosa transudate Patient must be at increased risk for HIV infection per USPSTF guidelines or pregnant.  Tests covered annually for patients at increased risk.  Pregnant patients may receive up to 3 test during pregnancy. Not applicable Not applicable    Medical Nutrition Therapy (MNT) (for diabetes or renal disease not recommended schedule) Requires referral by treating physician for patient with diabetes or renal disease.  Can be provided in same year as diabetes self-management training (DSMT), and CMS recommends medical nutrition therapy take place after DSMT.  Up to 3 hours for initial year and 2 hours in subsequent years. Not applicable Not applicable   Shingles Vaccination  Vaccination recommended for shingles vaccination. Overdue   Seasonal Influenza Vaccination (annually)  Continue with yearly "flu" shot annually Overdue   Pneumococcal Vaccination (once after 65)  Please receive this vaccination at age 90. Up to date   Hepatitis B Vaccinations (if medium/high risk) Medium/high risk factors:  End-stage renal disease,  Hemophiliacs who received Factor VIII or IX concentrates, Clients of institutions for the mentally retarded, Persons who live in the same house as a HepB virus carrier, Homosexual men, Illicit injectable drug abusers. Not applicable Not applicable   Screening Mammography (biennial age 16-74) Annually (age 102 or over) You need a mammogram yearly to screen for breast cancer. Up to date   Screening Pap Tests and Pelvic Examination (up to age 10 and after 56 if unknown history or abnormal study last 10 years) Every 24 months except high risk You need no Pap smear at this time. Not needed at this time.   Ultrasound Screening for Abdominal Aortic Aneurysm (AAA) (once) Patient must be referred through IPPE and not have had a screening for abdominal aortic aneurysm before under Medicare.  Limited to patients who meet one of the following criteria:  - Men who are 50-73 years old and have smoked more than 100 cigarettes in their lifetime.  -Anyone with a FH of AAA  -Anyone recommended for screening by USPSTF Not applicable Not applicable       Medicare Wellness Visit, Female      The best way to live healthy is to have a lifestyle where you eat a well-balanced diet, exercise regularly, limit alcohol use, and quit all forms of tobacco/nicotine, if applicable.     Regular preventive services are another way to keep healthy. Preventive services (vaccines, screening tests, monitoring & exams) can help personalize your care plan, which helps you manage your own care. Screening tests can find health problems at the earliest stages, when they are easiest to treat.   [facility] follows the current, evidence-based guidelines published by the [place] [employer] (USPSTF) when recommending preventive services for our patients. Because we follow these guidelines, sometimes recommendations change over time as research supports it. (For example, mammograms used to be recommended annually. Even though Medicare will still pay for an annual mammogram, the newer guidelines recommend a mammogram every two years  for women of average risk).  Of course, you and your doctor may decide to screen more often for some diseases, based on your risk and your co-morbidities (chronic disease you are already diagnosed with).     Preventive services for you include:  - Medicare offers their members a free annual wellness visit, which is time for you and your primary care provider to discuss and plan for your preventive service needs. Take advantage of this benefit every year!  -All adults over the age of 66 should receive the recommended pneumonia vaccines. Current USPSTF guidelines recommend a series of two vaccines for the best pneumonia protection.   -All adults should have a flu vaccine yearly and a tetanus vaccine every 10 years.   -All adults age 48 and older should receive the shingles vaccines (series of two vaccines).      -All adults age 55-70 who are overweight should have a diabetes screening test once every three years.    -All adults born between [birth year] and 1964 should be screened once for Hepatitis C.  -Other screening tests and preventive services for persons with diabetes include: an eye exam to screen for diabetic retinopathy, a kidney function test, a foot exam, and stricter control over your cholesterol.   -Cardiovascular screening for adults with routine risk involves an electrocardiogram (ECG) at intervals determined by your doctor.   -Colorectal cancer screenings should be done for adults age 22-75 with no increased risk factors for colorectal cancer.  There are a number of acceptable methods of screening for this type of cancer. Each test has its own benefits and drawbacks. Discuss with your doctor what is most appropriate for you during your annual wellness visit. The different tests include: colonoscopy (considered the best screening method), a fecal occult blood test, a fecal DNA test, and sigmoidoscopy.    -A bone mass density test is recommended when a woman turns 65 to screen for osteoporosis. This test is only recommended one time, as a screening. Some providers will use this same test as a disease monitoring tool if you already have osteoporosis.  -Breast cancer screenings are recommended every other year for women of normal risk, age 108-74.  -Cervical cancer screenings for women over age 35 are only recommended with certain risk factors.     Here is a list of your current Health Maintenance items (your personalized list of preventive services) with a due date:  Health Maintenance Due   Topic Date Due   ??? COVID-19 Vaccine (1 of 2) Never done   ??? Shingles Vaccine (1 of 2) Never done   ??? Annual Well Visit  09/02/2018   ??? Yearly Flu Vaccine (1) 12/27/2018

## 2019-06-21 NOTE — Progress Notes (Signed)
Jamie [name] presents today for   Chief Complaint   Patient presents with   ??? Shoulder Pain     Right shoulder pain intermittent. Patient reports a cyst on the right shoulder x 1 year or more.   ??? Weight Loss              Coordination of Care:  1. Have you been to the ER, urgent care clinic since your last visit?   Hospitalized since your last visit? no    2. Have you seen or consulted any other health care providers outside of the [facility] since your last visit?Include any pap smears or colon screening. yes

## 2019-06-21 NOTE — ACP (Advance Care Planning) (Signed)
Advance Care Planning  Advance Care Planning (ACP) Provider Conversation     Date of ACP Conversation: 06/21/19  Persons included in Conversation:  patient    Authorized Decision Maker (if patient is incapable of making informed decisions):   This person is:   Next of Kin by law (only applies in absence of a Runner, broadcasting/film/video)          For Patients with Decision Making Capacity:   Values/Goals: Exploration of values, goals, and preferences if recovery is not expected, even with continued medical treatment in the event of:  Imminent death  Severe, permanent brain injury    Conversation Outcomes / Follow-Up Plan:   ACP in process - information provided, considering goals and options.  She wants her daughters to be her POA's.    Dr. Marius Ditch  [facility], [place] [address]  Phone: [phone]  Fax: [phone]

## 2019-06-21 NOTE — Progress Notes (Signed)
[facility]:  06/21/2019, MRN: [id number]      NYLANI MICHETTI is a 79 y.o. female and presents to clinic for Shoulder Pain (Right shoulder pain intermittent. 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  She has a hard time swallowing pills.    1. H/o Breast and Uterine Cancer: Followed by Dr.Do. In remission. She met with Dr.Do in between apts.    03/14/19 Abdomen CT: The small subcentimeter right axillary lymph node appears stable. The confluent density in right peripectoral fat pad appears unchanged and remains indeterminate for etiology such as postop changes for scarring. No adenopathy or new finding to suggest metastasis. Chronic bronchitis and chronic bibasilar atelectasis. Other incidental findings as above.    2. Right Shoulder Pain/Arthralgia: Present for several wks. Sx are off/on and can radiate around her deltoid area. No trauma hx. No paresthesias/weakness.    3. Weight Loss: Weight is 135lbs. She reported some weight loss since her last in office visit with me. She eats a lot of candy and sweets. She reports some dental issues. She was told to have all of her teeth pulled - for yrs - but has declined. Her dentist is retiring. He treated her for multiple gingiva and dental infections.    Wt Readings from Last 3 Encounters:   06/21/19 135 lb (61.2 kg)   05/26/19 130 lb (59 kg)   04/04/19 138 lb (62.6 kg)     4. Health Maintenance:  - Declining the flu shot.  - +H/'o osteopenia. Last DEXA scan >2 yrs ago.  - She wants her daughters to be her 29. Their contact info is already in the chart.     5. HLD: Her last LDL was 114.  She is overdue for a cholesterol panel.  She is not on cholesterol medication.    Patient Active Problem List    Diagnosis Date Noted   ??? 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 History of uterine cancer: hysterectomy ovaries intact 06/13/2012     Priority: 2 - Two   ??? Hyperlipidemia 05/15/2018   ??? Anxiety 05/15/2018   ??? GERD (gastroesophageal reflux disease) 11/27/2015   ??? History of osteopenia T score -1.9 (2008)  11/27/2015   ??? Vitamin D deficiency 11/06/2011       Current Outpatient Medications   Medication Sig Dispense Refill   ??? multivit-min/iron/folic/lutein (CENTRUM SILVER WOMEN PO) Take  by mouth daily.     ??? calcium citrate-vitamin D3 (CITRACAL + D) tablet Take 1 Tab by mouth two (2) times a day.     ??? diphenhydrAMINE (Benadryl Allergy) 25 mg tablet Take two tabs 13 hrs, 7 hrs and then 1 hr prior to scan.  Indications: an allergic reaction 6 Tab 0   ??? predniSONE (DELTASONE) 10 mg tablet Take one tab 13 hrs, 7 hrs and then 1 hr prior to scan alone with 20 mg prednisone tabs. 3 Tab 0   ??? famotidine (PEPCID) 20 mg tablet Take one tab 13 hrs, 7 hrs and then 1 hr prior to scan.  3 Tab 0   ??? ALPRAZolam (XANAX) 0.25 mg tablet Take 1 Tab by mouth two (2) times a day. Max Daily Amount: 0.5 mg. 2 Tab 0   ??? lactose-reduced food (ENSURE HIGH PROTEIN PO) Take  by mouth daily as needed.         Allergies   Allergen Reactions   ??? Augmentin [Amoxicillin-Pot Clavulanate] Rash   ??? Clavulanic Acid Hives   ??? Lipitor [Atorvastatin] Other (comments)     Side effects   ??? Tamoxifen Nausea Only       Past Medical History:   Diagnosis Date   ??? Allergic rhinitis    ??? Arthritis    ??? Cancer ([place])     breast   ??? Colon polyps    ??? Depression    ??? Diverticulosis    ??? GERD (gastroesophageal reflux disease)    ??? Hepatomegaly    ??? IBS (irritable bowel syndrome)        Past Surgical History:   Procedure Laterality Date    ??? ENDOSCOPY, COLON, DIAGNOSTIC     ??? HX HYSTERECTOMY     ??? HX MASTECTOMY      modified right   ??? HX TONSILLECTOMY     ??? HX WISDOM TEETH EXTRACTION         Family History   Problem Relation Age of Onset   ??? Cancer Father         lung   ??? Other Mother         hyperthyroidism   ??? Coronary Artery Disease Mother    ??? Hypertension Mother    ??? Elevated Lipids Brother    ??? Cancer Brother         colon cancer   ??? Hypertension Brother        Social History     Tobacco Use   ??? Smoking status: Passive Smoke Exposure - Never Smoker   ??? Smokeless tobacco: Never Used   Substance Use Topics   ??? 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   HENT: Negative for ear pain and sore throat.    Eyes: Negative for blurred vision and pain.   Respiratory: Negative for shortness of breath.    Cardiovascular: Negative for chest pain.   Gastrointestinal: Negative for abdominal pain.   Genitourinary: Negative for dysuria and hematuria.   Musculoskeletal: Positive for joint pain. Negative for myalgias.   Skin: Negative for rash.   Neurological: Negative for headaches.   Endo/Heme/Allergies: Does not bruise/bleed easily.   Psychiatric/Behavioral: Negative for substance abuse. The patient is nervous/anxious.        Objective     Vitals:    06/21/19 0929   BP: 114/78   Pulse: 78   Resp: 14   Temp: 97.6 ??F (36.4 ??C)   TempSrc: Temporal   SpO2: 98%   Weight: 135 lb (61.2 kg)   Height: 5' 6"  (1.676 m)   PainSc:   0 - No pain       Physical Exam  Vitals signs and nursing note reviewed.   HENT:      Head: Normocephalic and atraumatic.      Right Ear: External ear normal.      Left Ear: External ear normal.   Eyes:      General: No scleral icterus.  Right eye: No discharge.         Left eye: No discharge.      Conjunctiva/sclera: Conjunctivae normal.   Neck:      Musculoskeletal: Neck supple.   Cardiovascular:       Rate and Rhythm: Normal rate and regular rhythm.      Heart sounds: Normal heart sounds. No murmur. No friction rub. No gallop.    Pulmonary:      Effort: Pulmonary effort is normal. No respiratory distress.      Breath sounds: Normal breath sounds. No wheezing or rales.   Abdominal:      General: Bowel sounds are normal. There is no distension.      Palpations: Abdomen is soft. There is no mass.      Tenderness: There is no abdominal tenderness. There is no guarding.   Musculoskeletal:         General: No swelling (BUE) or tenderness (BUE).      Comments: Decreased range of motion along her right shoulder secondary to pain.   Lymphadenopathy:      Cervical: No cervical adenopathy.   Skin:     General: Skin is warm and dry.      Findings: No erythema.   Neurological:      Mental Status: She is alert and oriented to person, place, and time.      Motor: No abnormal muscle tone.      Gait: Gait is intact. Gait normal.   Psychiatric:         Mood and Affect: Mood and affect normal.         LABS   Data Review:   Lab Results   Component Value Date/Time    WBC 8.3 10/24/2018 10:04 AM    HGB (POC) 15.1 06/17/2012 02:38 PM    HGB 14.7 10/24/2018 10:04 AM    HCT (POC) 46.1 06/17/2012 02:38 PM    HCT 45.1 (H) 10/24/2018 10:04 AM    PLATELET 262 10/24/2018 10:04 AM    MCV 92.8 10/24/2018 10:04 AM       Lab Results   Component Value Date/Time    Sodium 143 10/24/2018 10:04 AM    Potassium 4.5 10/24/2018 10:04 AM    Chloride 110 10/24/2018 10:04 AM    CO2 27 10/24/2018 10:04 AM    Anion gap 6 10/24/2018 10:04 AM    Glucose 98 10/24/2018 10:04 AM    BUN 15 10/24/2018 10:04 AM    Creatinine 0.85 10/24/2018 10:04 AM    BUN/Creatinine ratio 18 10/24/2018 10:04 AM    GFR est AA >60 10/24/2018 10:04 AM    GFR est non-AA >60 10/24/2018 10:04 AM    Calcium 9.5 10/24/2018 10:04 AM       Lab Results   Component Value Date/Time    Cholesterol, total 202 (H) 05/26/2018 08:15 AM    HDL Cholesterol 63 (H) 05/26/2018 08:15 AM     LDL, calculated 114.6 (H) 05/26/2018 08:15 AM    VLDL, calculated 24.4 05/26/2018 08:15 AM    Triglyceride 122 05/26/2018 08:15 AM    CHOL/HDL Ratio 3.2 05/26/2018 08:15 AM       Lab Results   Component Value Date/Time    Hemoglobin A1c 5.6 11/30/2016 02:38 PM       Assessment/Plan:   1. Arthralgia, unspecified joint: +Right Shoulder Pain -likely from a rotator cuff tendinopathy.  She declined seeing Orthopedics at this time.  ???Checking labs to rule out inflammatory arthritis.  ???Voltaren  gel prescribed    ORDERS:  - C REACTIVE PROTEIN, QT; Future  - RHEUMASSURE; Future  - METABOLIC PANEL, COMPREHENSIVE; Future  - diclofenac (VOLTAREN) 1 % gel; Apply 4 g to affected area four (4) times daily. Apply to affected area as needed to the right shoulder  Dispense: 450 g; Refill: 11    2. History of Breast and Uterine Cancer: In remission.  ???Checking labs.  ???Continue to follow-up with Oncology for surveillance studies.    ORDERS:  - METABOLIC PANEL, COMPREHENSIVE; Future  - CBC WITH AUTOMATED DIFF; Future    3. Hyperlipidemia:   ???Checking labs.    ORDERS:  - LIPID PANEL; Future  - TSH AND FREE T4; Future    4. Weight loss: From dental caries?  Must rule out other etiologies.  ???Checking TFTs for completeness.    ORDERS:  - TSH AND FREE T4; Future      Health Maintenance Due   Topic Date Due   ??? Hepatitis C Screening  [birth year]   ??? COVID-19 Vaccine (1 of 2) 08/08/1956   ??? Shingrix Vaccine Age 23> (1 of 2) 08/09/1991   ??? Medicare Yearly Exam  09/02/2018   ??? Flu Vaccine (1) 12/27/2018         Lab review: labs are reviewed in the EHR     I have discussed the diagnosis with the patient and the intended plan as seen in the above orders.  The patient has received an after-visit summary and questions were answered concerning future plans.  I have discussed medication side effects and warnings with the patient as well. I have reviewed the plan of care with the patient, accepted their input and they are in agreement with the treatment goals. All questions were answered. The patient understands the plan of care. Handouts provided today with above information. Pt instructed if symptoms worsen to call the office or report to the ED for continued care.  Greater than 50% of the visit time was spent in counseling and/or coordination of care.      Voice recognition was used to generate this report, which may have resulted in some phonetic based errors in grammar and contents. Even though attempts were made to correct all the mistakes, some may have been missed, and remained in the body of the document.          Leo Rod, MD

## 2019-06-21 NOTE — Progress Notes (Signed)
[facility]:  06/28/19, [id number]      The Subsequent Medicare Annual Wellness Exam PROGRESS NOTE    This is a Subsequent Medicare Annual Wellness Exam (AWV).    I have reviewed the patient's medical history in detail and updated the computerized patient record.     Jamie [name] is a 79 y.o. Caucasian female and presents for an annual wellness exam.    SUBJECTIVE    Past Medical History:   Diagnosis Date   ??? Allergic rhinitis    ??? Arthritis    ??? Cancer ([place])     breast   ??? Colon polyps    ??? Depression    ??? Diverticulosis    ??? GERD (gastroesophageal reflux disease)    ??? Hepatomegaly    ??? IBS (irritable bowel syndrome)       Past Surgical History:   Procedure Laterality Date   ??? ENDOSCOPY, COLON, DIAGNOSTIC     ??? HX HYSTERECTOMY     ??? HX MASTECTOMY      modified right   ??? HX TONSILLECTOMY     ??? HX WISDOM TEETH EXTRACTION       Current Outpatient Medications   Medication Sig Dispense Refill   ??? diclofenac (VOLTAREN) 1 % gel Apply 4 g to affected area four (4) times daily. Apply to affected area as needed to the right shoulder 450 g 11   ??? multivit-min/iron/folic/lutein (CENTRUM SILVER WOMEN PO) Take  by mouth daily.     ??? calcium citrate-vitamin D3 (CITRACAL + D) tablet Take 1 Tab by mouth two (2) times a day.     ??? diphenhydrAMINE (Benadryl Allergy) 25 mg tablet Take two tabs 13 hrs, 7 hrs and then 1 hr prior to scan.  Indications: an allergic reaction 6 Tab 0   ??? predniSONE (DELTASONE) 10 mg tablet Take one tab 13 hrs, 7 hrs and then 1 hr prior to scan alone with 20 mg prednisone tabs. 3 Tab 0   ??? famotidine (PEPCID) 20 mg tablet Take one tab 13 hrs, 7 hrs and then 1 hr prior to scan. 3 Tab 0   ??? ALPRAZolam (XANAX) 0.25 mg tablet Take 1 Tab by mouth two (2) times a day. Max Daily Amount: 0.5 mg. 2 Tab 0   ??? lactose-reduced food (ENSURE HIGH PROTEIN PO) Take  by mouth daily as needed.       Allergies   Allergen Reactions   ??? Augmentin [Amoxicillin-Pot Clavulanate] Rash   ??? Clavulanic Acid Hives    ??? Lipitor [Atorvastatin] Other (comments)     Side effects   ??? Tamoxifen Nausea Only     Family History   Problem Relation Age of Onset   ??? Cancer Father         lung   ??? Other Mother         hyperthyroidism   ??? Coronary Artery Disease Mother    ??? Hypertension Mother    ??? Elevated Lipids Brother    ??? Cancer Brother         colon cancer   ??? Hypertension Brother      Social History     Tobacco Use   ??? Smoking status: Passive Smoke Exposure - Never Smoker   ??? Smokeless tobacco: Never Used   Substance Use Topics   ??? 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 History of uterine cancer: hysterectomy ovaries intact Z85.42   ??? Vitamin D deficiency E55.9   ??? GERD (gastroesophageal reflux disease) K21.9   ??? History of osteopenia T score -1.9 (2008)  Z87.39   ??? Hyperlipidemia E78.5   ??? 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  She has a hard time swallowing pills.    Health Maintenance History  Immunizations reviewed:   Tdap over-due   Pneumovax: up to date   Flu: over-due  Zoster: over-due    Immunization History   Administered Date(s) Administered   ??? Influenza Vaccine 11/05/2008   ??? Influenza Vaccine Split 04/11/2010   ??? Influenza Vaccine Whole 11/05/2008   ??? Pneumococcal Polysaccharide (PPSV-23) 01/27/2007       Colonoscopy: Up to date. No bleeding.      Eye exam: Up to date. No vision changes.     Mammo: Overdue    Dexascan: Overdue. +H/o osteopenia.     Pelvic/Pap: No bleeding      Review of Systems   Constitutional: Positive for weight loss. Negative for chills and fever.    HENT: Negative for ear pain and sore throat.    Eyes: Negative for blurred vision and pain.   Respiratory: Negative for shortness of breath.    Cardiovascular: Negative for chest pain.   Gastrointestinal: Negative for abdominal pain, blood in stool and melena.   Genitourinary: Negative for dysuria and hematuria.   Musculoskeletal: Positive for joint pain. Negative for myalgias.   Skin: Negative for rash.   Neurological: Negative for tingling, focal weakness and headaches.   Endo/Heme/Allergies: Does not bruise/bleed easily.   Psychiatric/Behavioral: Negative for substance abuse. The patient is nervous/anxious.        Depression Risk Factor Screening:      Patient Health Questionnaire (PHQ-2)   Over the last 2 weeks, how often have you been bothered by any of the following problems?  ?? Little interest or pleasure in doing things?  ?? Not at all. [0]  ?? Feeling down, depressed, or hopeless?   ?? Not at all. [0]    Total Score: 0/6  PHQ-2 Assessment Scoring:   A score of 2 or more requires further screening with the PHQ-9    Alcohol Risk Factor Screening:   Women:   On any occasion during the past 3 months, have you had more than 3 drinks containing alcohol? no    Do you average more than 7 drinks per week? no    Tobacco Use Screening:     Social History     Tobacco Use   Smoking Status Passive Smoke Exposure - Never Smoker   Smokeless Tobacco Never Used       Hearing Loss    Hearing is good.    Activities of Daily Living   Self-care.   Requires assistance with: no ADLs  She does not need assistance with ADLs/IADLs    Fall Risk   no falls within the past year    Abuse Screen   None    Additional Examination Findings:  Vitals:    06/21/19 0929   BP: 114/78   Pulse: 78   Resp: 14  Temp: 97.6 ??F (36.4 ??C)   TempSrc: Temporal   SpO2: 98%   Weight: 135 lb (61.2 kg)   Height: 5\' 6"  (1.676 m)   PainSc:   0 - No pain      Body mass index is 21.79 kg/m??.     Evaluation of Cognitive Function:  Mood/affect: Euthymic   Appearance: Well-groomed  Family member/caregiver input: She is not accompanied by a family member      General:   Well-nourished, well-groomed, pleasant, alert, in no acute distress.     Head:  Normocephalic, atraumatic  Ears:  External ears WNL  Eyes:  Clear sclera  Neuro:   Alert, conversant, appropriate, following commands  Skin:    No rashes noted  Psych: Affect, mood and judgment appropriate      Dementia Screen  Clock Drawing (ten past eleven) Exercise: Unremarkable      LABS   Data Review:   Lab Results   Component Value Date/Time    Sodium 141 06/21/2019 10:21 AM    Potassium 4.4 06/21/2019 10:21 AM    Chloride 107 06/21/2019 10:21 AM    CO2 27 06/21/2019 10:21 AM    Anion gap 7 06/21/2019 10:21 AM    Glucose 85 06/21/2019 10:21 AM    BUN 19 (H) 06/21/2019 10:21 AM    Creatinine 0.79 06/21/2019 10:21 AM    BUN/Creatinine ratio 24 (H) 06/21/2019 10:21 AM    GFR est AA >60 06/21/2019 10:21 AM    GFR est non-AA >60 06/21/2019 10:21 AM    Calcium 10.1 06/21/2019 10:21 AM       Lab Results   Component Value Date/Time    WBC 6.9 06/21/2019 10:21 AM    HGB (POC) 15.1 06/17/2012 02:38 PM    HGB 15.5 06/21/2019 10:21 AM    HCT (POC) 46.1 06/17/2012 02:38 PM    HCT 47.1 (H) 06/21/2019 10:21 AM    PLATELET 277 06/21/2019 10:21 AM    MCV 92.4 06/21/2019 10:21 AM       Lab Results   Component Value Date/Time    Hemoglobin A1c 5.6 11/30/2016 02:38 PM       Lab Results   Component Value Date/Time    Cholesterol, total 229 (H) 06/21/2019 10:21 AM    HDL Cholesterol 81 (H) 06/21/2019 10:21 AM    LDL, calculated 129.6 (H) 06/21/2019 10:21 AM    VLDL, calculated 18.4 06/21/2019 10:21 AM    Triglyceride 92 06/21/2019 10:21 AM    CHOL/HDL Ratio 2.8 06/21/2019 10:21 AM         Patient Care Team:  Leo Rod, MD as PCP - General (Family Medicine)  Leo Rod, MD as PCP - [facility] Empaneled Provider  Suzy Bouchard, MD (Oncology)  Jodene Nam, RN as Ambulatory Care Manager (Internal Medicine)    End-of-life planning   Advanced Directive in the case than an injury or illness causes the patient to be unable to make health care decisions was discussed with the patient.     Advice/Referrals/Counselling/Plan:   Education and counseling provided:  Are appropriate based on today's review and evaluation  End-of-Life planning (with patient's consent)  Pneumococcal Vaccine  Influenza Vaccine  Screening Mammography  Screening Pap and pelvic (covered once every 2 years)  Colorectal cancer screening tests  Cardiovascular screening blood test  Bone mass measurement (DEXA)  Screening for glaucoma  Include in education list (weight loss, physical activity, smoking cessation, fall prevention, and nutrition)    ICD-10-CM ICD-9-CM  1. Weight loss  R63.4 783.21 TSH AND FREE T4   2. Need for hepatitis C screening test  Z11.59 V73.89 HEPATITIS C AB   3. Arthralgia, unspecified joint  M25.50 719.40 C REACTIVE PROTEIN, QT      RHEUMASSURE      METABOLIC PANEL, COMPREHENSIVE      diclofenac (VOLTAREN) 1 % gel   4. 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 History of uterine cancer: hysterectomy ovaries intact  [id number] METABOLIC PANEL, COMPREHENSIVE      CBC WITH AUTOMATED DIFF   6. Hyperlipidemia, unspecified hyperlipidemia type  E78.5 272.4 LIPID PANEL      TSH AND FREE T4   7. Encounter for screening mammogram for breast cancer  Z12.31 V76.12 MAM 3D TOMO W MAMMO BI SCREENING INCL CAD   8. Postmenopausal  Z78.0 V49.81 DEXA BONE DENSITY STUDY AXIAL   .  Brief written plan, checklist    Assessment/Plan:    Health Maintenance:  - Screening for hepatitis C per CDC recommendations.  - Mammogram ordered.  - DEXA scan ordered.    ORDERS:  - HEPATITIS C AB; Future  - MAM 3D TOMO W MAMMO BI SCREENING INCL CAD; Future  - DEXA BONE DENSITY STUDY AXIAL; Future        Lab review: labs are reviewed in the [place] (ACP) Provider Conversation     Date of ACP Conversation: 06/21/19  Persons included in Conversation:  patient    Authorized Decision Maker (if patient is incapable of making informed decisions):   This person is:   Next of Kin by law (only applies in absence of a Runner, broadcasting/film/video)          For Patients with Decision Making Capacity:   Values/Goals: Exploration of values, goals, and preferences if recovery is not expected, even with continued medical treatment in the event of:  Imminent death  Severe, permanent brain injury    Conversation Outcomes / Follow-Up Plan:   ACP in process - information provided, considering goals and options.  She wants her daughters to be her POA's.        I have discussed the diagnosis with the patient and the intended plan as seen in the above orders.  The patient has received an after-visit summary and questions were answered concerning future plans.  I have discussed medication side effects and warnings with the patient as well.I have reviewed the plan of care with the patient, accepted their input and they are in agreement with the treatment goals.     Follow-up and Dispositions    ?? Return in about 1 year (around 06/20/2020).

## 2019-06-21 NOTE — Telephone Encounter (Signed)
Fyi-At check out pt did not want to schedule a return visit. She said she would call.

## 2019-06-21 NOTE — ACP (Advance Care Planning) (Signed)
Advance Care Planning  Advance Care Planning (ACP) Provider Conversation     Date of ACP Conversation: 06/21/19  Persons included in Conversation:  patient    Authorized Decision Maker (if patient is incapable of making informed decisions):   This person is:   Next of Kin by law (only applies in absence of a Runner, broadcasting/film/video)          For Patients with Decision Making Capacity:   Values/Goals: Exploration of values, goals, and preferences if recovery is not expected, even with continued medical treatment in the event of:  Imminent death  Severe, permanent brain injury    Conversation Outcomes / Follow-Up Plan:   ACP in process - information provided, considering goals and options.  She wants her daughters to be her POA's.    Dr. Marius Ditch  [facility], [place] [address]  Phone: [phone]  Fax: [phone]

## 2019-06-21 NOTE — Progress Notes (Signed)
[facility]:  06/28/19, [id number]      The Subsequent Medicare Annual Wellness Exam PROGRESS NOTE    This is a Subsequent Medicare Annual Wellness Exam (AWV).    I have reviewed the patient's medical history in detail and updated the computerized patient record.     Jamie [name] is a 79 y.o. Caucasian female and presents for an annual wellness exam.    SUBJECTIVE    Past Medical History:   Diagnosis Date   ??? Allergic rhinitis    ??? Arthritis    ??? Cancer ([place])     breast   ??? Colon polyps    ??? Depression    ??? Diverticulosis    ??? GERD (gastroesophageal reflux disease)    ??? Hepatomegaly    ??? IBS (irritable bowel syndrome)       Past Surgical History:   Procedure Laterality Date   ??? ENDOSCOPY, COLON, DIAGNOSTIC     ??? HX HYSTERECTOMY     ??? HX MASTECTOMY      modified right   ??? HX TONSILLECTOMY     ??? HX WISDOM TEETH EXTRACTION       Current Outpatient Medications   Medication Sig Dispense Refill   ??? diclofenac (VOLTAREN) 1 % gel Apply 4 g to affected area four (4) times daily. Apply to affected area as needed to the right shoulder 450 g 11   ??? multivit-min/iron/folic/lutein (CENTRUM SILVER WOMEN PO) Take  by mouth daily.     ??? calcium citrate-vitamin D3 (CITRACAL + D) tablet Take 1 Tab by mouth two (2) times a day.     ??? diphenhydrAMINE (Benadryl Allergy) 25 mg tablet Take two tabs 13 hrs, 7 hrs and then 1 hr prior to scan.  Indications: an allergic reaction 6 Tab 0   ??? predniSONE (DELTASONE) 10 mg tablet Take one tab 13 hrs, 7 hrs and then 1 hr prior to scan alone with 20 mg prednisone tabs. 3 Tab 0   ??? famotidine (PEPCID) 20 mg tablet Take one tab 13 hrs, 7 hrs and then 1 hr prior to scan. 3 Tab 0   ??? ALPRAZolam (XANAX) 0.25 mg tablet Take 1 Tab by mouth two (2) times a day. Max Daily Amount: 0.5 mg. 2 Tab 0   ??? lactose-reduced food (ENSURE HIGH PROTEIN PO) Take  by mouth daily as needed.       Allergies   Allergen Reactions   ??? Augmentin [Amoxicillin-Pot Clavulanate] Rash   ??? Clavulanic Acid Hives   ???  Lipitor [Atorvastatin] Other (comments)     Side effects   ??? Tamoxifen Nausea Only     Family History   Problem Relation Age of Onset   ??? Cancer Father         lung   ??? Other Mother         hyperthyroidism   ??? Coronary Artery Disease Mother    ??? Hypertension Mother    ??? Elevated Lipids Brother    ??? Cancer Brother         colon cancer   ??? Hypertension Brother      Social History     Tobacco Use   ??? Smoking status: Passive Smoke Exposure - Never Smoker   ??? Smokeless tobacco: Never Used   Substance Use Topics   ??? 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 History of uterine cancer: hysterectomy ovaries intact Z85.42   ??? Vitamin D deficiency E55.9   ??? GERD (gastroesophageal reflux disease) K21.9   ??? History of osteopenia T score -1.9 (2008)  Z87.39   ??? Hyperlipidemia E78.5   ??? 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  She has a hard time swallowing pills.    Health Maintenance History  Immunizations reviewed:   Tdap over-due   Pneumovax: up to date   Flu: over-due  Zoster: over-due    Immunization History   Administered Date(s) Administered   ??? Influenza Vaccine 11/05/2008   ??? Influenza Vaccine Split 04/11/2010   ??? Influenza Vaccine Whole 11/05/2008   ??? Pneumococcal Polysaccharide (PPSV-23) 01/27/2007       Colonoscopy: Up to date. No bleeding.      Eye exam: Up to date. No vision changes.     Mammo: Overdue    Dexascan: Overdue. +H/o osteopenia.     Pelvic/Pap: No bleeding      Review of Systems   Constitutional: Positive for weight loss. Negative for chills and fever.   HENT:  Negative for ear pain and sore throat.    Eyes: Negative for blurred vision and pain.   Respiratory: Negative for shortness of breath.    Cardiovascular: Negative for chest pain.   Gastrointestinal: Negative for abdominal pain, blood in stool and melena.   Genitourinary: Negative for dysuria and hematuria.   Musculoskeletal: Positive for joint pain. Negative for myalgias.   Skin: Negative for rash.   Neurological: Negative for tingling, focal weakness and headaches.   Endo/Heme/Allergies: Does not bruise/bleed easily.   Psychiatric/Behavioral: Negative for substance abuse. The patient is nervous/anxious.        Depression Risk Factor Screening:      Patient Health Questionnaire (PHQ-2)   Over the last 2 weeks, how often have you been bothered by any of the following problems?  ?? Little interest or pleasure in doing things?  ?? Not at all. [0]  ?? Feeling down, depressed, or hopeless?   ?? Not at all. [0]    Total Score: 0/6  PHQ-2 Assessment Scoring:   A score of 2 or more requires further screening with the PHQ-9    Alcohol Risk Factor Screening:   Women:   On any occasion during the past 3 months, have you had more than 3 drinks containing alcohol? no    Do you average more than 7 drinks per week? no    Tobacco Use Screening:     Social History     Tobacco Use   Smoking Status Passive Smoke Exposure - Never Smoker   Smokeless Tobacco Never Used       Hearing Loss    Hearing is good.    Activities of Daily Living   Self-care.   Requires assistance with: no ADLs  She does not need assistance with ADLs/IADLs    Fall Risk   no falls within the past year    Abuse Screen   None    Additional Examination Findings:  Vitals:    06/21/19 0929   BP: 114/78   Pulse: 78   Resp: 14  Temp: 97.6 ??F (36.4 ??C)   TempSrc: Temporal   SpO2: 98%   Weight: 135 lb (61.2 kg)   Height: 5\' 6"  (1.676 m)   PainSc:   0 - No pain      Body mass index is 21.79 kg/m??.     Evaluation of Cognitive Function:  Mood/affect: Euthymic  Appearance:  Well-groomed  Family member/caregiver input: She is not accompanied by a family member      General:   Well-nourished, well-groomed, pleasant, alert, in no acute distress.     Head:  Normocephalic, atraumatic  Ears:  External ears WNL  Eyes:  Clear sclera  Neuro:   Alert, conversant, appropriate, following commands  Skin:    No rashes noted  Psych: Affect, mood and judgment appropriate      Dementia Screen  Clock Drawing (ten past eleven) Exercise: Unremarkable      LABS   Data Review:   Lab Results   Component Value Date/Time    Sodium 141 06/21/2019 10:21 AM    Potassium 4.4 06/21/2019 10:21 AM    Chloride 107 06/21/2019 10:21 AM    CO2 27 06/21/2019 10:21 AM    Anion gap 7 06/21/2019 10:21 AM    Glucose 85 06/21/2019 10:21 AM    BUN 19 (H) 06/21/2019 10:21 AM    Creatinine 0.79 06/21/2019 10:21 AM    BUN/Creatinine ratio 24 (H) 06/21/2019 10:21 AM    GFR est AA >60 06/21/2019 10:21 AM    GFR est non-AA >60 06/21/2019 10:21 AM    Calcium 10.1 06/21/2019 10:21 AM       Lab Results   Component Value Date/Time    WBC 6.9 06/21/2019 10:21 AM    HGB (POC) 15.1 06/17/2012 02:38 PM    HGB 15.5 06/21/2019 10:21 AM    HCT (POC) 46.1 06/17/2012 02:38 PM    HCT 47.1 (H) 06/21/2019 10:21 AM    PLATELET 277 06/21/2019 10:21 AM    MCV 92.4 06/21/2019 10:21 AM       Lab Results   Component Value Date/Time    Hemoglobin A1c 5.6 11/30/2016 02:38 PM       Lab Results   Component Value Date/Time    Cholesterol, total 229 (H) 06/21/2019 10:21 AM    HDL Cholesterol 81 (H) 06/21/2019 10:21 AM    LDL, calculated 129.6 (H) 06/21/2019 10:21 AM    VLDL, calculated 18.4 06/21/2019 10:21 AM    Triglyceride 92 06/21/2019 10:21 AM    CHOL/HDL Ratio 2.8 06/21/2019 10:21 AM         Patient Care Team:  Leo Rod, MD as PCP - General (Family Medicine)  Leo Rod, MD as PCP - [facility] Empaneled Provider  Suzy Bouchard, MD (Oncology)  Jodene Nam, RN as Ambulatory Care Manager (Internal Medicine)    End-of-life planning  Advanced  Directive in the case than an injury or illness causes the patient to be unable to make health care decisions was discussed with the patient.     Advice/Referrals/Counselling/Plan:   Education and counseling provided:  Are appropriate based on today's review and evaluation  End-of-Life planning (with patient's consent)  Pneumococcal Vaccine  Influenza Vaccine  Screening Mammography  Screening Pap and pelvic (covered once every 2 years)  Colorectal cancer screening tests  Cardiovascular screening blood test  Bone mass measurement (DEXA)  Screening for glaucoma  Include in education list (weight loss, physical activity, smoking cessation, fall prevention, and nutrition)    ICD-10-CM ICD-9-CM  1. Weight loss  R63.4 783.21 TSH AND FREE T4   2. Need for hepatitis C screening test  Z11.59 V73.89 HEPATITIS C AB   3. Arthralgia, unspecified joint  M25.50 719.40 C REACTIVE PROTEIN, QT      RHEUMASSURE      METABOLIC PANEL, COMPREHENSIVE      diclofenac (VOLTAREN) 1 % gel   4. 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 History of uterine cancer: hysterectomy ovaries intact  [id number] METABOLIC PANEL, COMPREHENSIVE      CBC WITH AUTOMATED DIFF   6. Hyperlipidemia, unspecified hyperlipidemia type  E78.5 272.4 LIPID PANEL      TSH AND FREE T4   7. Encounter for screening mammogram for breast cancer  Z12.31 V76.12 MAM 3D TOMO W MAMMO BI SCREENING INCL CAD   8. Postmenopausal  Z78.0 V49.81 DEXA BONE DENSITY STUDY AXIAL   .  Brief written plan, checklist    Assessment/Plan:    Health Maintenance:  - Screening for hepatitis C per CDC recommendations.  - Mammogram ordered.  - DEXA scan ordered.    ORDERS:  - HEPATITIS C AB; Future  - MAM 3D TOMO W MAMMO BI SCREENING INCL CAD; Future  - DEXA BONE DENSITY STUDY AXIAL; Future        Lab review: labs are reviewed in the [place] (ACP) Provider Conversation     Date of ACP Conversation: 06/21/19  Persons included in Conversation:  patient    Authorized Decision Maker (if patient is incapable of making informed decisions):   This person is:   Next of Kin by law (only applies in absence of a Runner, broadcasting/film/video)          For Patients with Decision Making Capacity:   Values/Goals: Exploration of values, goals, and preferences if recovery is not expected, even with continued medical treatment in the event of:  Imminent death  Severe, permanent brain injury    Conversation Outcomes / Follow-Up Plan:   ACP in process - information provided, considering goals and options.  She wants her daughters to be her POA's.        I have discussed the diagnosis with the patient and the intended plan as seen in the above orders.  The patient has received an after-visit summary and questions were answered concerning future plans.  I have discussed medication side effects and warnings with the patient as well.I have reviewed the plan of care with the patient, accepted their input and they are in agreement with the treatment goals.     Follow-up and Dispositions    ?? Return in about 1 year (around 06/20/2020).

## 2019-06-21 NOTE — Progress Notes (Signed)
[facility]:  06/21/2019, MRN: [id number]      Jamie [name] is a 79 y.o. female and presents to clinic for Shoulder Pain (Right shoulder pain intermittent. 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  She has a hard time swallowing pills.    1. H/o Breast and Uterine Cancer: Followed by Dr.Do. In remission. She met with Dr.Do in between apts.    03/14/19 Abdomen CT: The small subcentimeter right axillary lymph node appears stable. The confluent density in right peripectoral fat pad appears unchanged and remains indeterminate for etiology such as postop changes for scarring. No adenopathy or new finding to suggest metastasis. Chronic bronchitis and chronic bibasilar atelectasis. Other incidental findings as above.    2. Right Shoulder Pain/Arthralgia: Present for several wks. Sx are off/on and can radiate around her deltoid area. No trauma hx. No paresthesias/weakness.    3. Weight Loss: Weight is 135lbs. She reported some weight loss since her last in office visit with me. She eats a lot of candy and sweets. She reports some dental issues. She was told to have all of her teeth pulled - for yrs - but has declined. Her dentist is retiring. He treated her for multiple gingiva and dental infections.    Wt Readings from Last 3 Encounters:   06/21/19 135 lb (61.2 kg)   05/26/19 130 lb (59 kg)   04/04/19 138 lb (62.6 kg)     4. Health Maintenance:  - Declining the flu shot.  - +H/'o osteopenia. Last DEXA scan >2 yrs ago.  - She wants her daughters to be her 26. Their contact info is already in the chart.    5. HLD: Her last LDL was 114.  She is overdue for a  cholesterol panel.  She is not on cholesterol medication.    Patient Active Problem List    Diagnosis Date Noted   ??? 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 History of uterine cancer: hysterectomy ovaries intact 06/13/2012     Priority: 2 - Two   ??? Hyperlipidemia 05/15/2018   ??? Anxiety 05/15/2018   ??? GERD (gastroesophageal reflux disease) 11/27/2015   ??? History of osteopenia T score -1.9 (2008)  11/27/2015   ??? Vitamin D deficiency 11/06/2011       Current Outpatient Medications   Medication Sig Dispense Refill   ??? multivit-min/iron/folic/lutein (CENTRUM SILVER WOMEN PO) Take  by mouth daily.     ??? calcium citrate-vitamin D3 (CITRACAL + D) tablet Take 1 Tab by mouth two (2) times a day.     ??? diphenhydrAMINE (Benadryl Allergy) 25 mg tablet Take two tabs 13 hrs, 7 hrs and then 1 hr prior to scan.  Indications: an allergic reaction 6 Tab 0   ??? predniSONE (DELTASONE) 10 mg tablet Take one tab 13 hrs, 7 hrs and then 1 hr prior to scan alone with 20 mg prednisone tabs. 3 Tab 0   ??? famotidine (PEPCID) 20 mg tablet Take one tab 13 hrs, 7 hrs and then 1 hr prior to scan.  3 Tab 0   ??? ALPRAZolam (XANAX) 0.25 mg tablet Take 1 Tab by mouth two (2) times a day. Max Daily Amount: 0.5 mg. 2 Tab 0   ??? lactose-reduced food (ENSURE HIGH PROTEIN PO) Take  by mouth daily as needed.         Allergies   Allergen Reactions   ??? Augmentin [Amoxicillin-Pot Clavulanate] Rash   ??? Clavulanic Acid Hives   ??? Lipitor [Atorvastatin] Other (comments)     Side effects   ??? Tamoxifen Nausea Only       Past Medical History:   Diagnosis Date   ??? Allergic rhinitis    ??? Arthritis    ??? Cancer ([place])     breast   ??? Colon polyps    ??? Depression    ??? Diverticulosis    ??? GERD (gastroesophageal reflux disease)    ??? Hepatomegaly    ??? IBS (irritable bowel syndrome)        Past Surgical History:   Procedure Laterality Date   ??? ENDOSCOPY, COLON, DIAGNOSTIC     ??? HX HYSTERECTOMY     ??? HX  MASTECTOMY      modified right   ??? HX TONSILLECTOMY     ??? HX WISDOM TEETH EXTRACTION         Family History   Problem Relation Age of Onset   ??? Cancer Father         lung   ??? Other Mother         hyperthyroidism   ??? Coronary Artery Disease Mother    ??? Hypertension Mother    ??? Elevated Lipids Brother    ??? Cancer Brother         colon cancer   ??? Hypertension Brother        Social History     Tobacco Use   ??? Smoking status: Passive Smoke Exposure - Never Smoker   ??? Smokeless tobacco: Never Used   Substance Use Topics   ??? 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   HENT: Negative for ear pain and sore throat.    Eyes: Negative for blurred vision and pain.   Respiratory: Negative for shortness of breath.    Cardiovascular: Negative for chest pain.   Gastrointestinal: Negative for abdominal pain.   Genitourinary: Negative for dysuria and hematuria.   Musculoskeletal: Positive for joint pain. Negative for myalgias.   Skin: Negative for rash.   Neurological: Negative for headaches.   Endo/Heme/Allergies: Does not bruise/bleed easily.   Psychiatric/Behavioral: Negative for substance abuse. The patient is nervous/anxious.        Objective     Vitals:    06/21/19 0929   BP: 114/78   Pulse: 78   Resp: 14   Temp: 97.6 ??F (36.4 ??C)   TempSrc: Temporal   SpO2: 98%   Weight: 135 lb (61.2 kg)   Height: '5\' 6"'$  (1.676 m)   PainSc:   0 - No pain       Physical Exam  Vitals signs and nursing note reviewed.   HENT:      Head: Normocephalic and atraumatic.      Right Ear: External ear normal.      Left Ear: External ear normal.   Eyes:      General: No scleral icterus.  Right eye: No discharge.         Left eye: No discharge.      Conjunctiva/sclera: Conjunctivae normal.   Neck:      Musculoskeletal: Neck supple.   Cardiovascular:      Rate and Rhythm: Normal rate and regular rhythm.      Heart sounds: Normal heart sounds. No  murmur. No friction rub. No gallop.    Pulmonary:      Effort: Pulmonary effort is normal. No respiratory distress.      Breath sounds: Normal breath sounds. No wheezing or rales.   Abdominal:      General: Bowel sounds are normal. There is no distension.      Palpations: Abdomen is soft. There is no mass.      Tenderness: There is no abdominal tenderness. There is no guarding.   Musculoskeletal:         General: No swelling (BUE) or tenderness (BUE).      Comments: Decreased range of motion along her right shoulder secondary to pain.   Lymphadenopathy:      Cervical: No cervical adenopathy.   Skin:     General: Skin is warm and dry.      Findings: No erythema.   Neurological:      Mental Status: She is alert and oriented to person, place, and time.      Motor: No abnormal muscle tone.      Gait: Gait is intact. Gait normal.   Psychiatric:         Mood and Affect: Mood and affect normal.         LABS   Data Review:   Lab Results   Component Value Date/Time    WBC 8.3 10/24/2018 10:04 AM    HGB (POC) 15.1 06/17/2012 02:38 PM    HGB 14.7 10/24/2018 10:04 AM    HCT (POC) 46.1 06/17/2012 02:38 PM    HCT 45.1 (H) 10/24/2018 10:04 AM    PLATELET 262 10/24/2018 10:04 AM    MCV 92.8 10/24/2018 10:04 AM       Lab Results   Component Value Date/Time    Sodium 143 10/24/2018 10:04 AM    Potassium 4.5 10/24/2018 10:04 AM    Chloride 110 10/24/2018 10:04 AM    CO2 27 10/24/2018 10:04 AM    Anion gap 6 10/24/2018 10:04 AM    Glucose 98 10/24/2018 10:04 AM    BUN 15 10/24/2018 10:04 AM    Creatinine 0.85 10/24/2018 10:04 AM    BUN/Creatinine ratio 18 10/24/2018 10:04 AM    GFR est AA >60 10/24/2018 10:04 AM    GFR est non-AA >60 10/24/2018 10:04 AM    Calcium 9.5 10/24/2018 10:04 AM       Lab Results   Component Value Date/Time    Cholesterol, total 202 (H) 05/26/2018 08:15 AM    HDL Cholesterol 63 (H) 05/26/2018 08:15 AM    LDL, calculated 114.6 (H) 05/26/2018 08:15 AM    VLDL, calculated 24.4 05/26/2018 08:15 AM    Triglyceride  122 05/26/2018 08:15 AM    CHOL/HDL Ratio 3.2 05/26/2018 08:15 AM       Lab Results   Component Value Date/Time    Hemoglobin A1c 5.6 11/30/2016 02:38 PM       Assessment/Plan:   1. Arthralgia, unspecified joint: +Right Shoulder Pain -likely from a rotator cuff tendinopathy.  She declined seeing Orthopedics at this time.  ???Checking labs to rule out inflammatory arthritis.  ???Voltaren  gel prescribed    ORDERS:  - C REACTIVE PROTEIN, QT; Future  - RHEUMASSURE; Future  - METABOLIC PANEL, COMPREHENSIVE; Future  - diclofenac (VOLTAREN) 1 % gel; Apply 4 g to affected area four (4) times daily. Apply to affected area as needed to the right shoulder  Dispense: 450 g; Refill: 11    2. History of Breast and Uterine Cancer: In remission.  ???Checking labs.  ???Continue to follow-up with Oncology for surveillance studies.    ORDERS:  - METABOLIC PANEL, COMPREHENSIVE; Future  - CBC WITH AUTOMATED DIFF; Future    3. Hyperlipidemia:   ???Checking labs.    ORDERS:  - LIPID PANEL; Future  - TSH AND FREE T4; Future    4. Weight loss: From dental caries?  Must rule out other etiologies.  ???Checking TFTs for completeness.    ORDERS:  - TSH AND FREE T4; Future      Health Maintenance Due   Topic Date Due   ??? Hepatitis C Screening  06/16/1940   ??? COVID-19 Vaccine (1 of 2) 08/08/1957   ??? Shingrix Vaccine Age 39> (1 of 2) 08/09/1991   ??? Medicare Yearly Exam  09/02/2018   ??? Flu Vaccine (1) 12/27/2018         Lab review: labs are reviewed in the EHR    I have discussed the diagnosis with the patient and the intended plan as seen in the above orders.  The patient has received an after-visit summary and questions were answered concerning future plans.  I have discussed medication side effects and warnings with the patient as well. I have reviewed the plan of care with the patient, accepted their input and they are in agreement with the treatment goals. All questions were answered. The patient understands the plan of care. Handouts provided today with  above information. Pt instructed if symptoms worsen to call the office or report to the ED for continued care.  Greater than 50% of the visit time was spent in counseling and/or coordination of care.      Voice recognition was used to generate this report, which may have resulted in some phonetic based errors in grammar and contents. Even though attempts were made to correct all the mistakes, some may have been missed, and remained in the body of the document.          Leo Rod, MD

## 2019-06-21 NOTE — Progress Notes (Signed)
 Jamie [name] presents today for   Chief Complaint   Patient presents with   . Shoulder Pain     Right shoulder pain intermittent. Patient reports a cyst on the right shoulder x 1 year or more.   . Weight Loss              Coordination of Care:  1. Have you been to the ER, urgent care clinic since your last visit?   Hospitalized since your last visit? no    2. Have you seen or consulted any other health care providers outside of the [facility] since your last visit?Include any pap smears or colon screening. yes

## 2019-06-22 LAB — HEPATITIS C AB
Hep C virus Ab Interp.: NEGATIVE
Hepatitis C virus Ab: 0.06 Index (ref <0.80)

## 2019-06-22 LAB — HEPATITIS C ANTIBODY
HCV Ab: 0.06 Index (ref <0.80)
Hepatitis C Ab: NEGATIVE

## 2019-06-22 NOTE — Telephone Encounter (Signed)
Pt was  Prescribed diclofenac she is asking if there is another ointment she can get prescribed in place of that because it is to expensive for her please advise

## 2019-06-23 NOTE — Telephone Encounter (Signed)
Please have her get it over the counter as Voltaren gel. The cost should be no more than 20 dollars.    Dr. Marius Ditch  [facility], [place] [address]  Phone: [phone]  Fax: [phone]

## 2019-06-23 NOTE — Telephone Encounter (Signed)
Patient advised to get Volatren gel. Understood. Not further questions.

## 2019-06-23 NOTE — Telephone Encounter (Signed)
Unable to leave a voicemail, mailbox is full. Will try to reach patient again at a later time.

## 2019-06-23 NOTE — Telephone Encounter (Signed)
Patient is calling for her lab results.

## 2019-06-30 NOTE — Telephone Encounter (Signed)
Pt called with questions about her lab results. She is adamantly requesting that if she misses your call, keep calling.

## 2019-06-30 NOTE — Telephone Encounter (Signed)
I explained her results. All questions were answered.    Dr. Marius Ditch  [facility], [place] [address]  Phone: [phone]  Fax: [phone]

## 2019-07-04 NOTE — Progress Notes (Signed)
Lab results from 06/21/19 mailed to patient per her request.

## 2019-07-06 DIAGNOSIS — R768 Other specified abnormal immunological findings in serum: Secondary | ICD-10-CM | POA: Insufficient documentation

## 2019-07-06 LAB — RHEUMASSURE
14.3.3 ETA, Rheum. Arthritis: <0.2 ng/mL
14.3.3 ETA,Rheum. Arthritis: <0.2 ng/mL
CCP ANTIBODIES IGG/IGA: <20 Units
CCP Antibodies IgG/IgA: <20 Units
Rheumatoid Arthritis Factor: 15.8 Units/mL — ABNORMAL HIGH
Rheumatoid Arthritis Factor: 15.8 Units/mL — ABNORMAL HIGH

## 2019-07-25 ENCOUNTER — Encounter

## 2019-07-25 NOTE — Progress Notes (Signed)
Referral generated per dr.mason.

## 2019-07-25 NOTE — Telephone Encounter (Signed)
Patient reached and given message. Patient states she may be septic from a UTI, or maybe dehydrated. Patient asked if DR.Cornelia Copa is going to send her to a nutritionist. Patient advised to go to ER for a full evaluation, and a message will be sent to the doctor about the nutritionist. Patient then became upset and said " why is DR.Mason even a doctor". Patient aware that I would let DR.Mason know about our call.

## 2019-07-25 NOTE — Telephone Encounter (Signed)
Patient called complaining of pain under her rib cage for 2 weeks. Last night and this morning she was having cold sweats and chills. First 30 min is a virtual on Thursday. She refused the appt. Asking to be fit in tomorrow.

## 2019-07-25 NOTE — Telephone Encounter (Signed)
Tried reaching patient to notify her that the referral has been placed to nutrition. Her mailbox was full and I was unable to leave a voicemail. Please advise patient that BS in motion will call her to schedule the appointment.

## 2019-07-25 NOTE — Telephone Encounter (Signed)
Please have her seen in the ED or urgent care. Is the pain along her left chest? Any chance she could have been exposed to Covid?    Dr. Marius Ditch  [facility], [place] [address]  Phone: [phone]  Fax: [phone]

## 2019-08-16 ENCOUNTER — Ambulatory Visit (INDEPENDENT_AMBULATORY_CARE_PROVIDER_SITE_OTHER): Payer: Medicare Other | Admitting: Family Medicine

## 2019-08-16 ENCOUNTER — Other Ambulatory Visit: Payer: Self-pay

## 2019-08-16 ENCOUNTER — Encounter: Payer: Self-pay | Admitting: Family Medicine

## 2019-08-16 ENCOUNTER — Ambulatory Visit (INDEPENDENT_AMBULATORY_CARE_PROVIDER_SITE_OTHER): Payer: Medicare Other

## 2019-08-16 VITALS — BP 114/70 | HR 71 | Ht 66.0 in | Wt 130.0 lb

## 2019-08-16 DIAGNOSIS — R0781 Pleurodynia: Secondary | ICD-10-CM | POA: Diagnosis not present

## 2019-08-16 DIAGNOSIS — R300 Dysuria: Secondary | ICD-10-CM | POA: Diagnosis not present

## 2019-08-16 DIAGNOSIS — N390 Urinary tract infection, site not specified: Secondary | ICD-10-CM | POA: Diagnosis not present

## 2019-08-16 DIAGNOSIS — R590 Localized enlarged lymph nodes: Secondary | ICD-10-CM

## 2019-08-16 DIAGNOSIS — R634 Abnormal weight loss: Secondary | ICD-10-CM

## 2019-08-16 DIAGNOSIS — Z800 Family history of malignant neoplasm of digestive organs: Secondary | ICD-10-CM

## 2019-08-16 DIAGNOSIS — J439 Emphysema, unspecified: Secondary | ICD-10-CM

## 2019-08-16 DIAGNOSIS — Z1211 Encounter for screening for malignant neoplasm of colon: Secondary | ICD-10-CM

## 2019-08-16 LAB — POCT URINALYSIS DIP (CLINITEK)
Glucose, UA: NEGATIVE mg/dL
Ketones, POC UA: NEGATIVE mg/dL
Nitrite, UA: POSITIVE — AB
POC PROTEIN,UA: 30 — AB
Spec Grav, UA: >=1.03 — AB (ref 1.010–1.025)
Urobilinogen, UA: 0.2 E.U./dL
pH, UA: 5.5 (ref 5.0–8.0)

## 2019-08-16 MED ORDER — NITROFURANTOIN MONOHYD MACRO 100 MG PO CAPS: 100.0000 mg | ORAL_CAPSULE | Freq: Two times a day (BID) | ORAL | 0 refills | Status: DC

## 2019-08-16 NOTE — Assessment & Plan Note (Signed)
Brother had colon cancer.  She reports that she had a colonoscopy about 12 years ago though we do not have this on file.  She reports that she had about 6 polyps removed and one that they saw that they did not remove it all came back benign but she is not had any type of screening since then.  She does not want to have a colonoscopy but is willing to do Cologuard and would actually like to have that done.

## 2019-08-16 NOTE — Progress Notes (Signed)
Established Patient Office Visit  Subjective:  Patient ID: [place] Kaufman, female    DOB: [birth year]  Age: 79 y.o. MRN: [id number]  CC:  Chief Complaint  Patient presents with  . Follow-up    HPI [place] Kaufman presents for  Had seen her doctor in [place].    Follow-up abnormal weight loss-I saw her in January she lost about 5 pounds but she recently had some teeth extracted had not been eating very regularly.  We discussed eating more frequently throughout the day and not skipping meals.  She has lost about 3 more pounds since she was here in January.  BMI is now 20.  Says for the last 3 weeks she has been trying to eat more consistently and eat more healthy.  She says that her dentist says that she actually needs more teeth extracted.  She has a history of recurrent UTIs and feels like more recently she is having urinary symptoms again.  Last time when I saw her in January we treated her for UTI because she had had a recent UTI for which she did not finish the antibiotics.  She is also been experiencing some right lower rib pain and right upper quadrant pain for about 3 weeks.  She says it was quite severe at 1 point it was difficult to get out of bed she did call her providers office to get in and they told her to go to urgent care not sure of the situation and if maybe they did not have any appointments available she is very concerned because she has a history of breast cancer on that right breast.  Does like her pain is better overall maybe about 85% better but it still there and is still sore.  She is worried about her liver and her gallbladder.  She says the pain started right after she had done some intensive stretching in bed when she woke up feeling a little bit stiff.  She is never had any GI symptoms with it or fever or chills.  Did have a CT of the chest abdomen and pelvis with contrast done March 14, 2019 in [place].  It was noted to have some chronic atelectasis in the  posterior bilateral lower lobes which was stable she also had some parenchymal scarring seen with mild emphysema.  He had extensive calcified bronchial wall calcifications.  She was noted to have a 7 mm lymph node in the right axilla which was stable from previous examinations as well as a soft tissue density in the subpectoral fat pad, felt to be possible postoperative scarring, on the right side as well.  Again felt to be stable.  She was noted to have some nodularity and possible thickening of the gallbladder they recommended ultrasound if she was having symptoms.  They also report that she had a large fecal ball in the rectum.  No suspicious bony lesions at that time.  Past Medical History:  Diagnosis Date  . Breast cancer [facility])     Past Surgical History:  Procedure Laterality Date  . BREAST BIOPSY    . MASTECTOMY    . MASTECTOMY W/ SENTINEL NODE BIOPSY     Right. 18 years ago.      Family History  Problem Relation Age of Onset  . Heart disease Mother   . Anemia Mother        penicous anemia.   . Alzheimer's disease Mother     Social History   Socioeconomic History  . Marital  status: Single    Spouse name: Not on file  . Number of children: Not on file  . Years of education: Not on file  . Highest education level: Not on file  Occupational History  . Not on file  Tobacco Use  . Smoking status: Never Smoker  . Smokeless tobacco: Never Used  Substance and Sexual Activity  . Alcohol use: Never    Comment: Rarley.    . Drug use: Never  . Sexual activity: Not Currently  Other Topics Concern  . Not on file  Social History Narrative   Lives in [place] but stays with her daughter 1-2 weeks out of the month.     Social Determinants of Health   Financial Resource Strain:   . Difficulty of Paying Living Expenses:   Food Insecurity:   . Worried About Charity fundraiser in the Last Year:   . Arboriculturist in the Last Year:   Transportation Needs:   . Film/video editor  (Medical):   Marland Kitchen Lack of Transportation (Non-Medical):   Physical Activity:   . Days of Exercise per Week:   . Minutes of Exercise per Session:   Stress:   . Feeling of Stress :   Social Connections:   . Frequency of Communication with Friends and Family:   . Frequency of Social Gatherings with Friends and Family:   . Attends Religious Services:   . Active Member of Clubs or Organizations:   . Attends Archivist Meetings:   Marland Kitchen Marital Status:   Intimate Partner Violence:   . Fear of Current or Ex-Partner:   . Emotionally Abused:   Marland Kitchen Physically Abused:   . Sexually Abused:     Outpatient Medications Prior to Visit  Medication Sig Dispense Refill  . Calcium Citrate (CITRACAL PO) Take by mouth.    . Multiple Vitamins-Minerals (CENTRUM SILVER 50+WOMEN PO) Take 1 tablet by mouth daily.    Marland Kitchen estradiol (ESTRACE VAGINAL) 0.1 MG/GM vaginal cream 1 external application around vagina and urethra twice a week. (Patient not taking: Reported on 08/16/2019) 42.5 g 2   No facility-administered medications prior to visit.    Allergies  Allergen Reactions  . Atorvastatin     Other reaction(s): Other (comments) Side effects  . Clavulanic Acid Hives  . Tamoxifen Nausea Only    ROS Review of Systems    Objective:    Physical Exam  Constitutional: She is oriented to person, place, and time. She appears well-developed and well-nourished.  HENT:  Head: Normocephalic and atraumatic.  Cardiovascular: Normal rate, regular rhythm and normal heart sounds.  Pulmonary/Chest: Effort normal and breath sounds normal.  Tender over there is right lower ribs.  She does have a fleshy colored smooth mole on the surface which she said she has had removed previously.  And just above that mole she does have what feels like a little bit of firm tissue underneath the skin possible lipoma.  Neurological: She is alert and oriented to person, place, and time.  Skin: Skin is warm and dry.  Psychiatric:  She has a normal mood and affect. Her behavior is normal.    BP 114/70   Pulse 71   Ht 5\' 6"  (1.676 m)   Wt 130 lb (59 kg)   SpO2 97%   BMI 20.98 kg/m  Wt Readings from Last 3 Encounters:  08/16/19 130 lb (59 kg)  05/17/19 133 lb (60.3 kg)  02/22/19 136 lb (61.7 kg)  There are no preventive care reminders to display for this patient.  There are no preventive care reminders to display for this patient.  Lab Results  Component Value Date   TSH 1.76 02/22/2019   Lab Results  Component Value Date   WBC 8.0 02/22/2019   HGB 15.3 02/22/2019   HCT 46.2 (H) 02/22/2019   MCV 91.7 02/22/2019   PLT 261 02/22/2019   Lab Results  Component Value Date   NA 140 02/22/2019   K 4.4 02/22/2019   CO2 28 02/22/2019   GLUCOSE 93 02/22/2019   BUN 22 02/22/2019   CREATININE 0.97 (H) 02/22/2019   BILITOT 1.3 (H) 02/22/2019   ALKPHOS 75 09/01/2017   AST 17 02/22/2019   ALT 8 02/22/2019   PROT 6.7 02/22/2019   ALBUMIN 3.8 06/30/2007   CALCIUM 10.3 02/22/2019   Lab Results  Component Value Date   CHOL 227 (A) 09/01/2017   Lab Results  Component Value Date   HDL 70 09/01/2017   Lab Results  Component Value Date   LDLCALC 135 09/01/2017   Lab Results  Component Value Date   TRIG 111 09/01/2017   No results found for: [facility] Lab Results  Component Value Date   HGBA1C 5.5 05/17/2019      Assessment & Plan:   Problem List Items Addressed This Visit      Respiratory   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  Please see care everywhere for result notes.  No current symptoms        Genitourinary   Recurrent UTI - Primary    She quit using the Estrace cream about 3 weeks ago because of the right lower rib pain that she was experiencing.  She says she started getting some dysuria and frequency symptoms about a week ago.  Last time she was treated for UTI in our office was January and she had multiple UTIs prior to that.  I was actually  happy that she is gone 3 months without symptoms.  We have encouraged her to get back on the Estrace cream.  Go ahead and treat with nitrofurantoin.      Relevant Medications   nitrofurantoin, macrocrystal-monohydrate, (MACROBID) 100 MG capsule   Other Relevant Orders   POCT URINALYSIS DIP (CLINITEK) (Completed)     Other   Family history of colon cancer    Brother had colon cancer.  She reports that she had a colonoscopy about 12 years ago though we do not have this on file.  She reports that she had about 6 polyps removed and one that they saw that they did not remove it all came back benign but she is not had any type of screening since then.  She does not want to have a colonoscopy but is willing to do Cologuard and would actually like to have that done.      Discrete lymph node    Other Visit Diagnoses    Dysuria       Abnormal weight loss       Rib pain on right side       Relevant Orders   DG Ribs Unilateral W/Chest Right   Screen for colon cancer       Relevant Orders   Cologuard     Rib pain-suspect that she may have actually just stretched or injured the cartilage.  Especially since she has been getting progressively better.  Because of her breast cancer history  she is very worried.  Abnormal weight loss-she has done an additional 3 pounds.  Discussed continuing to work on trying to really keep her nutrition and calories up especially while she is having dental issues and will continue to follow we will see her back in 2 months to see if she is able to maintain through that time.  Dysuria/UTI-urinalysis positive for nitrites and small amount of blood as well as a little bit of protein.  We will go ahead and treat with Macrobid.  Call if symptoms not improving did encourage her to restart her Estrace.   Meds ordered this encounter  Medications  . nitrofurantoin, macrocrystal-monohydrate, (MACROBID) 100 MG capsule    Sig: Take 1 capsule (100 mg total) by mouth 2 (two)  times daily.    Dispense:  10 capsule    Refill:  0    Follow-up: Return in about 2 months (around 10/16/2019) for recheck on weight loss.   Time spent 45 minutes in encounter including reviewing notes through care everywhere  [name], MD

## 2019-08-16 NOTE — Assessment & Plan Note (Signed)
Noted on CT scan November 2020 in [place].  Please see care everywhere for result notes.  No current symptoms

## 2019-08-16 NOTE — Assessment & Plan Note (Addendum)
She quit using the Estrace cream about 3 weeks ago because of the right lower rib pain that she was experiencing.  She says she started getting some dysuria and frequency symptoms about a week ago.  Last time she was treated for UTI in our office was January and she had multiple UTIs prior to that.  I was actually happy that she is gone 3 months without symptoms.  We have encouraged her to get back on the Estrace cream.  Go ahead and treat with nitrofurantoin.

## 2019-08-18 ENCOUNTER — Other Ambulatory Visit: Payer: Self-pay

## 2019-08-18 DIAGNOSIS — N390 Urinary tract infection, site not specified: Secondary | ICD-10-CM

## 2019-08-18 MED ORDER — NITROFURANTOIN MONOHYD MACRO 100 MG PO CAPS: 100.0000 mg | ORAL_CAPSULE | Freq: Two times a day (BID) | ORAL | 0 refills | Status: DC

## 2019-08-18 NOTE — Telephone Encounter (Signed)
[place] wants [place] to send the prescription to the pharmacy.

## 2019-08-30 ENCOUNTER — Ambulatory Visit: Payer: Medicare Other | Admitting: Family Medicine

## 2019-11-09 ENCOUNTER — Encounter: Payer: Self-pay | Admitting: Family Medicine

## 2019-11-09 ENCOUNTER — Ambulatory Visit (INDEPENDENT_AMBULATORY_CARE_PROVIDER_SITE_OTHER): Payer: Medicare Other | Admitting: Family Medicine

## 2019-11-09 VITALS — BP 124/71 | HR 87 | Ht 66.0 in | Wt 129.0 lb

## 2019-11-09 DIAGNOSIS — R1011 Right upper quadrant pain: Secondary | ICD-10-CM | POA: Diagnosis not present

## 2019-11-09 DIAGNOSIS — R634 Abnormal weight loss: Secondary | ICD-10-CM | POA: Diagnosis not present

## 2019-11-09 DIAGNOSIS — Z800 Family history of malignant neoplasm of digestive organs: Secondary | ICD-10-CM | POA: Diagnosis not present

## 2019-11-09 NOTE — Progress Notes (Signed)
Established Patient Office Visit  Subjective:  Patient ID: [place] Kaufman, female    DOB: 10/14/1940  Age: 79 y.o. MRN: [id number]  CC:  Chief Complaint  Patient presents with  . Follow-up    HPI [place] Kaufman presents for RUQ.  She reports that she had a small lump removed over that right lower rib area when she had her breast cancer diagnosis years ago.  She says that as far she is aware that lesion was not cancer related but says that it eventually grew back and that its been very tender since then.  When she shows me on her skin where the lesion is she basically has a mole that is superficial but she is tender over the ribs..  She also complains of pain in that right upper quadrant in the softer part of the abdomen.  Has been going on for a long time.  Says last colonoscopy was about 13 years ago in [place] and had polyp issues.  She does have a brother who was diagnosed with colon cancer's.  Having some jaw pain, but is scheduled for some dental abstractions next week.    In regards to weight loss she has been drinking 1 boost daily.  She says on her home scale she is actually up 1 pound.  She still has significant difficulty with eating because she has very few teeth.  And says she just does not feel motivated to cook for herself so she staying with her daughter she actually does better but if she is at home she really just does not eat very much at all.  Past Medical History:  Diagnosis Date  . Breast cancer [facility])     Past Surgical History:  Procedure Laterality Date  . BREAST BIOPSY    . MASTECTOMY    . MASTECTOMY W/ SENTINEL NODE BIOPSY     Right. 18 years ago.      Family History  Problem Relation Age of Onset  . Heart disease Mother   . Anemia Mother        penicous anemia.   . Alzheimer's disease Mother     Social History   Socioeconomic History  . Marital status: Single    Spouse name: Not on file  . Number of children: Not on file  . Years of education: Not on  file  . Highest education level: Not on file  Occupational History  . Not on file  Tobacco Use  . Smoking status: Never Smoker  . Smokeless tobacco: Never Used  Substance and Sexual Activity  . Alcohol use: Never    Comment: Rarley.    . Drug use: Never  . Sexual activity: Not Currently  Other Topics Concern  . Not on file  Social History Narrative   Lives in [place] but stays with her daughter 1-2 weeks out of the month.     Social Determinants of Health   Financial Resource Strain:   . Difficulty of Paying Living Expenses:   Food Insecurity:   . Worried About Charity fundraiser in the Last Year:   . Arboriculturist in the Last Year:   Transportation Needs:   . Film/video editor (Medical):   Marland Kitchen Lack of Transportation (Non-Medical):   Physical Activity:   . Days of Exercise per Week:   . Minutes of Exercise per Session:   Stress:   . Feeling of Stress :   Social Connections:   . Frequency of Communication with Friends  and Family:   . Frequency of Social Gatherings with Friends and Family:   . Attends Religious Services:   . Active Member of Clubs or Organizations:   . Attends Archivist Meetings:   Marland Kitchen Marital Status:   Intimate Partner Violence:   . Fear of Current or Ex-Partner:   . Emotionally Abused:   Marland Kitchen Physically Abused:   . Sexually Abused:     Outpatient Medications Prior to Visit  Medication Sig Dispense Refill  . Calcium Citrate (CITRACAL PO) Take by mouth.    . estradiol (ESTRACE VAGINAL) 0.1 MG/GM vaginal cream 1 external application around vagina and urethra twice a week. 42.5 g 2  . Multiple Vitamins-Minerals (CENTRUM SILVER 50+WOMEN PO) Take 1 tablet by mouth daily.    . nitrofurantoin, macrocrystal-monohydrate, (MACROBID) 100 MG capsule Take 1 capsule (100 mg total) by mouth 2 (two) times daily. 10 capsule 0   No facility-administered medications prior to visit.    Allergies  Allergen Reactions  . Atorvastatin     Other reaction(s):  Other (comments) Side effects  . Clavulanic Acid Hives  . Tamoxifen Nausea Only    ROS Review of Systems    Objective:    Physical Exam Constitutional:      Appearance: She is well-developed.  HENT:     Head: Normocephalic and atraumatic.  Cardiovascular:     Rate and Rhythm: Normal rate and regular rhythm.     Heart sounds: Normal heart sounds.  Pulmonary:     Effort: Pulmonary effort is normal.     Breath sounds: Normal breath sounds.  Abdominal:     General: Abdomen is flat.     Comments: Mild tenderness in the right upper quadrant but no mass or fullness.  Musculoskeletal:     Comments: Some mild tenderness over the right lower ribs.  Skin:    General: Skin is warm and dry.     Comments: She has a mole over the right lower chest over the rib area that appears completely benign.  Neurological:     Mental Status: She is alert and oriented to person, place, and time.  Psychiatric:        Behavior: Behavior normal.     BP 124/71   Pulse 87   Ht 5\' 6"  (1.676 m)   Wt 129 lb (58.5 kg)   SpO2 99%   BMI 20.82 kg/m  Wt Readings from Last 3 Encounters:  11/09/19 129 lb (58.5 kg)  08/16/19 130 lb (59 kg)  05/17/19 133 lb (60.3 kg)     There are no preventive care reminders to display for this patient.  There are no preventive care reminders to display for this patient.  Lab Results  Component Value Date   TSH 1.76 02/22/2019   Lab Results  Component Value Date   WBC 7.4 11/09/2019   HGB 15.3 11/09/2019   HCT 47.3 (H) 11/09/2019   MCV 93.3 11/09/2019   PLT 265 11/09/2019   Lab Results  Component Value Date   NA 141 11/09/2019   K 4.3 11/09/2019   CO2 29 11/09/2019   GLUCOSE 100 (H) 11/09/2019   BUN 25 11/09/2019   CREATININE 0.88 11/09/2019   BILITOT 1.0 11/09/2019   ALKPHOS 75 09/01/2017   AST 18 11/09/2019   ALT 11 11/09/2019   PROT 7.2 11/09/2019   ALBUMIN 3.8 06/30/2007   CALCIUM 10.3 11/09/2019   Lab Results  Component Value Date    CHOL 227 (A) 09/01/2017  Lab Results  Component Value Date   HDL 70 09/01/2017   Lab Results  Component Value Date   LDLCALC 135 09/01/2017   Lab Results  Component Value Date   TRIG 111 09/01/2017   No results found for: CHOLHDL Lab Results  Component Value Date   HGBA1C 5.5 05/17/2019      Assessment & Plan:   Problem List Items Addressed This Visit      Other   RUQ pain    A little bit of tenderness in the right upper quadrant I do not palpate any mass or fullness.  I think referral to GI is reasonable especially with prior history of multiple polyps and family history of colon cancer which is what she is most worried and concerned about.      Relevant Orders   Ambulatory referral to Gastroenterology   COMPLETE METABOLIC PANEL WITH GFR (Completed)   Urinalysis, Routine w reflex microscopic (Completed)   CBC with Differential/Platelet (Completed)   Family history of colon cancer   Abnormal weight loss - Primary    Weight is actually stable from the last time I saw her almost 3 months ago which is great.  Continue with boost supplement but I really want her to continue to work on eating regular food in addition to the boost.  She says she does not like to cook and right now she only has about 5 teeth in her mouth so has significant difficulty with chewing and she is getting ready to have 2 more of them extracted next week.         No orders of the defined types were placed in this encounter.   Follow-up: Return in about 6 months (around 05/11/2020) for Lung check .    [name], MD

## 2019-11-09 NOTE — Assessment & Plan Note (Signed)
Weight is actually stable from the last time I saw her almost 3 months ago which is great.  Continue with boost supplement but I really want her to continue to work on eating regular food in addition to the boost.  She says she does not like to cook and right now she only has about 5 teeth in her mouth so has significant difficulty with chewing and she is getting ready to have 2 more of them extracted next week.

## 2019-11-09 NOTE — Assessment & Plan Note (Signed)
A little bit of tenderness in the right upper quadrant I do not palpate any mass or fullness.  I think referral to GI is reasonable especially with prior history of multiple polyps and family history of colon cancer which is what she is most worried and concerned about.

## 2019-11-10 ENCOUNTER — Encounter: Payer: Self-pay | Admitting: Family Medicine

## 2019-11-13 LAB — URINALYSIS, ROUTINE W REFLEX MICROSCOPIC
Bilirubin Urine: NEGATIVE
Glucose, UA: NEGATIVE
Hyaline Cast: NONE SEEN /LPF
Nitrite: NEGATIVE
Specific Gravity, Urine: 1.027 (ref 1.001–1.03)
pH: <=5 (ref 5.0–8.0)

## 2019-11-13 LAB — COMPLETE METABOLIC PANEL WITH GFR
AG Ratio: 1.6 (calc) (ref 1.0–2.5)
ALT: 11 U/L (ref 6–29)
AST: 18 U/L (ref 10–35)
Albumin: 4.4 g/dL (ref 3.6–5.1)
Alkaline phosphatase (APISO): 84 U/L (ref 37–153)
BUN: 25 mg/dL (ref 7–25)
CO2: 29 mmol/L (ref 20–32)
Calcium: 10.3 mg/dL (ref 8.6–10.4)
Chloride: 103 mmol/L (ref 98–110)
Creat: 0.88 mg/dL (ref 0.60–0.93)
GFR, Est African American: 73 mL/min/{1.73_m2} (ref >=60)
GFR, Est Non African American: 63 mL/min/{1.73_m2} (ref >=60)
Globulin: 2.8 g/dL (calc) (ref 1.9–3.7)
Glucose, Bld: 100 mg/dL — ABNORMAL HIGH (ref 65–99)
Potassium: 4.3 mmol/L (ref 3.5–5.3)
Sodium: 141 mmol/L (ref 135–146)
Total Bilirubin: 1 mg/dL (ref 0.2–1.2)
Total Protein: 7.2 g/dL (ref 6.1–8.1)

## 2019-11-13 LAB — CBC WITH DIFFERENTIAL/PLATELET
Absolute Monocytes: 740 cells/uL (ref 200–950)
Basophils Absolute: 52 cells/uL (ref 0–200)
Basophils Relative: 0.7 %
Eosinophils Absolute: 81 cells/uL (ref 15–500)
Eosinophils Relative: 1.1 %
HCT: 47.3 % — ABNORMAL HIGH (ref 35.0–45.0)
Hemoglobin: 15.3 g/dL (ref 11.7–15.5)
Lymphs Abs: 1302 cells/uL (ref 850–3900)
MCH: 30.2 pg (ref 27.0–33.0)
MCHC: 32.3 g/dL (ref 32.0–36.0)
MCV: 93.3 fL (ref 80.0–100.0)
MPV: 11.6 fL (ref 7.5–12.5)
Monocytes Relative: 10 %
Neutro Abs: 5224 cells/uL (ref 1500–7800)
Neutrophils Relative %: 70.6 %
Platelets: 265 10*3/uL (ref 140–400)
RBC: 5.07 10*6/uL (ref 3.80–5.10)
RDW: 12.7 % (ref 11.0–15.0)
Total Lymphocyte: 17.6 %
WBC: 7.4 10*3/uL (ref 3.8–10.8)

## 2019-11-13 LAB — MICROSCOPIC MESSAGE

## 2019-11-16 ENCOUNTER — Ambulatory Visit: Payer: Medicare Other | Admitting: Family Medicine

## 2019-11-17 ENCOUNTER — Telehealth: Payer: Self-pay

## 2019-11-17 NOTE — Telephone Encounter (Signed)
Pt called stating she is due to for a colonoscopy. She is going to schedule an appointment with Dr. Carol Ada.

## 2019-11-17 NOTE — Telephone Encounter (Signed)
Ok sounds goood. Does she need a referral placed?

## 2019-11-21 ENCOUNTER — Ambulatory Visit: Payer: MEDICARE | Primary: Family Medicine

## 2019-11-21 NOTE — Telephone Encounter (Signed)
Patient has already been referred to a GI, No needs at this time

## 2019-11-21 NOTE — Telephone Encounter (Signed)
Patient returned call and expressed that she had been trying to get an appointment with Dr. Cornelia Copa to discuss her concerns, but she has not been able to make an appointment.  I scheduled patient for appointment on 11/27/19 per Patient request to address weight loss.  Patient states that she is down 9 pounds since she last saw [place] in February and she is concerned.  All questions were answered and patient voiced understanding of date and time of appointment.

## 2019-11-21 NOTE — Telephone Encounter (Signed)
Returned call, no answer and unable to leave a message.

## 2019-11-21 NOTE — Telephone Encounter (Signed)
Patient called 11/20/19 asking to speak with office manager.  Call placed to patient but she did not answer and unable to leave a message due to the mailbox being full.

## 2019-11-21 NOTE — Telephone Encounter (Signed)
Pt called back aked to please call her

## 2019-11-23 ENCOUNTER — Ambulatory Visit: Payer: Medicare Other | Admitting: Family Medicine

## 2019-11-23 NOTE — Telephone Encounter (Signed)
Pt notified and appt changed

## 2019-11-23 NOTE — Telephone Encounter (Signed)
Ok to schedule her for a virtual apt.    Dr. Marius Ditch  [facility], [place] [address]  Phone: [phone]  Fax: [phone]
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
## 2019-11-27 ENCOUNTER — Telehealth: Attending: Family Medicine | Primary: Family Medicine

## 2019-11-27 ENCOUNTER — Telehealth: Admit: 2019-11-27 | Discharge: 2019-11-27 | Payer: MEDICARE | Attending: Family Medicine | Primary: Family Medicine

## 2019-11-27 ENCOUNTER — Encounter

## 2019-11-27 DIAGNOSIS — R079 Chest pain, unspecified: Secondary | ICD-10-CM

## 2019-11-27 NOTE — Progress Notes (Signed)
Jamie [name] is a 79 y.o. female who was seen by synchronous (real-time) audio-phone only technology on 11/27/2019.        Assessment & Plan:   1. Chronic obstructive pulmonary disease: Observation.    2. Infiltrating ductal carcinoma of right breast:   - Checking labs  - I encouraged her to f/u with Dr.Do.    ORDERS:  - METABOLIC PANEL, COMPREHENSIVE; Future  - CBC WITH AUTOMATED DIFF; Future    3. Chest Pain: Atypical - likely from costochondritis  - CXR and labs ordered.  If her labs and chest x-ray are unremarkable, I discussed how her symptoms are likely from costochondritis.  All questions were answered.    ORDERS:  - XR CHEST PA LAT; Future  - METABOLIC PANEL, COMPREHENSIVE; Future  - CBC WITH AUTOMATED DIFF; Future    4. Unintentional weight loss  ???Checking labs.    ORDERS:  - METABOLIC PANEL, COMPREHENSIVE; Future  - CBC WITH AUTOMATED DIFF; Future  - TSH AND FREE T4; Future        Lab review: labs are reviewed in the EHR and ordered as mentioned above.     I have discussed the diagnosis with the patient and the intended plan as seen in the above orders. I have discussed medication side effects and warnings with the patient as well. I have reviewed the plan of care with the patient, accepted their input and they are in agreement with the treatment goals. All questions were answered. The patient understands the plan of care. Pt instructed if symptoms worsen to call the office or report to the ED for continued care.  Greater than 50% of the visit time was spent in counseling and/or coordination of care. I spent 17 min with the pt for this phone only encounter.      Voice recognition was used to generate this report, which may have resulted in some phonetic based errors in grammar and contents. Even though attempts were made to correct all the mistakes, some may have been missed, and remained in the body of the document.      Subjective:   Jamie [name] was seen for   Chief Complaint   Patient presents  with   ??? Chest Pain     ??The pt is a 79yo female with h/o right breast cancer (ER positive) s/p right mastectomy, vitamin D deficiency, HLD, IBS per pt hx, uterine cancer s/p hysterectomy, osteopenia,??urinary incontinence,??h/o GI bleeding??per EHR, COPD, and GERD.??She??identifies as a germaphobic and anxious??person.????She has a hard time swallowing pills.    1. Right Chest Pain: Present since March. Pain is "soreness." Pain is constant. +H/o right mastectomy given h/o breast cancer. In remission. Certain movements trigger her sx. No SOB/palpitations.  Note that similar symptoms worsen patient to get evaluated in April while she was staying with family in [place].  At that time, a chest x-ray was obtained and findings are listed below.    03/14/19 Chest CT: The small subcentimeter right axillary lymph node appears stable. The confluent density in right peripectoral fat pad appears unchanged and remains indeterminate for etiology such as postop changes for scarring. No adenopathy or new finding to suggest metastasis. Chronic bronchitis and chronic bibasilar atelectasis. Other incidental findings as above.    08/16/19 Right Rib Xray: No rib fracture or rib lesion.  No acute cardiopulmonary disease.     2. Unintentional Weight Loss: She has a h/o dental caries and gingivitis; since her last apt, she had  two additional teeth pulled.Her weight is down to 127lbs. "I love to eat." Her appetite is good. No abdominal pain. No bleeding hx.     3. COPD: No tobacco use. Asymptomatic at present.     4. H/o Breast Cancer: In remission. Followed by Dr.Do. She canceled her apt with Dr.Do tomorrow.  She had labs drawn from July by a doctor in [place].  They were reviewed today.  Her CBC and CMP are unremarkable.    **She sees another doctor if she goes to [place] to see her family.**      Prior to Admission medications    Medication Sig Start Date End Date Taking? Authorizing Provider   diclofenac (VOLTAREN) 1 % gel Apply 4 g to  affected area four (4) times daily. Apply to affected area as needed to the right shoulder 06/21/19   Leo Rod, MD   diphenhydrAMINE (Benadryl Allergy) 25 mg tablet Take two tabs 13 hrs, 7 hrs and then 1 hr prior to scan.  Indications: an allergic reaction 02/16/19   Do, Ena Dawley, MD   famotidine (PEPCID) 20 mg tablet Take one tab 13 hrs, 7 hrs and then 1 hr prior to scan. 02/16/19   Do, Ena Dawley, MD   lactose-reduced food (ENSURE HIGH PROTEIN PO) Take  by mouth daily as needed.    Provider, Historical   multivit-min/iron/folic/lutein (CENTRUM SILVER WOMEN PO) Take  by mouth daily.    Provider, Historical   calcium citrate-vitamin D3 (CITRACAL + D) tablet Take 1 Tab by mouth two (2) times a day.    Provider, Historical     Allergies   Allergen Reactions   ??? Augmentin [Amoxicillin-Pot Clavulanate] Rash   ??? Clavulanic Acid Hives   ??? Lipitor [Atorvastatin] Other (comments)     Side effects   ??? Tamoxifen Nausea Only     Past Medical History:   Diagnosis Date   ??? Allergic rhinitis    ??? Arthritis    ??? Cancer ([place])     breast   ??? Colon polyps    ??? Depression    ??? Diverticulosis    ??? GERD (gastroesophageal reflux disease)    ??? Hepatomegaly    ??? IBS (irritable bowel syndrome)      Past Surgical History:   Procedure Laterality Date   ??? ENDOSCOPY, COLON, DIAGNOSTIC     ??? HX HYSTERECTOMY     ??? HX MASTECTOMY      modified right   ??? HX TONSILLECTOMY     ??? HX WISDOM TEETH EXTRACTION       Family History   Problem Relation Age of Onset   ??? Cancer Father         lung   ??? Other Mother         hyperthyroidism   ??? Coronary Artery Disease Mother    ??? Hypertension Mother    ??? Elevated Lipids Brother    ??? Cancer Brother         colon cancer   ??? Hypertension Brother      Social History     Socioeconomic History   ??? Marital status: DIVORCED     Spouse name: Not on file   ??? Number of children: Not on file   ??? Years of education: Not on file   ??? Highest education level: Not on file   Tobacco Use   ??? Smoking status: Passive Smoke Exposure -  Never Smoker   ??? Smokeless tobacco: Never Used  Substance and Sexual Activity   ??? 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 Drug use: No   ??? Sexual activity: Never     Social Determinants of Health     Financial Resource Strain:    ??? Difficulty of Paying Living Expenses:    Food Insecurity:    ??? Worried About Charity fundraiser in the Last Year:    ??? Arboriculturist in the Last Year:    Transportation Needs:    ??? Film/video editor (Medical):    ??? Lack of Transportation (Non-Medical):    Physical Activity:    ??? Days of Exercise per Week:    ??? Minutes of Exercise per Session:    Stress:    ??? Feeling of Stress :    Social Connections:    ??? Frequency of Communication with Friends and Family:    ??? Frequency of Social Gatherings with Friends and Family:    ??? Attends Religious Services:    ??? Marine scientist or Organizations:    ??? Attends Music therapist:    ??? Marital Status:        ROS:  Gen: No fever/chills  HEENT: No sore throat, eye pain, ear pain, or congestion. No HA  CV: + CP  Resp: No cough/SOB  GI: No abdominal pain  GU: No hematuria/dysuria  Derm: No rash  Neuro: No new paresthesias/weakness  Musc: No new myalgias/jt pain  Psych: No depression sx        LABS:  Lab Results   Component Value Date/Time    Sodium 141 06/21/2019 10:21 AM    Potassium 4.4 06/21/2019 10:21 AM    Chloride 107 06/21/2019 10:21 AM    CO2 27 06/21/2019 10:21 AM    Anion gap 7 06/21/2019 10:21 AM    Glucose 85 06/21/2019 10:21 AM    BUN 19 (H) 06/21/2019 10:21 AM    Creatinine 0.79 06/21/2019 10:21 AM    BUN/Creatinine ratio 24 (H) 06/21/2019 10:21 AM    GFR est AA >60 06/21/2019 10:21 AM    GFR est non-AA >60 06/21/2019 10:21 AM    Calcium 10.1 06/21/2019 10:21 AM       Lab Results   Component Value Date/Time    Cholesterol, total 229 (H) 06/21/2019 10:21 AM    HDL Cholesterol 81 (H) 06/21/2019 10:21 AM    LDL, calculated 129.6 (H) 06/21/2019 10:21 AM    VLDL, calculated 18.4  06/21/2019 10:21 AM    Triglyceride 92 06/21/2019 10:21 AM    CHOL/HDL Ratio 2.8 06/21/2019 10:21 AM       Lab Results   Component Value Date/Time    WBC 6.9 06/21/2019 10:21 AM    HGB (POC) 15.1 06/17/2012 02:38 PM    HGB 15.5 06/21/2019 10:21 AM    HCT (POC) 46.1 06/17/2012 02:38 PM    HCT 47.1 (H) 06/21/2019 10:21 AM    PLATELET 277 06/21/2019 10:21 AM    MCV 92.4 06/21/2019 10:21 AM       Lab Results   Component Value Date/Time    Hemoglobin A1c 5.6 11/30/2016 02:38 PM       Lab Results   Component Value Date/Time    TSH 2.18 06/21/2019 10:21 AM           Due to this being a TeleHealth phone only evaluation, many elements of the physical examination are unable  to be assessed.     The pt was seen by synchronous (real-time) audio-phone only technology, and/or her healthcare decision maker, is aware that this patient-initiated, Telehealth encounter is a billable service, with coverage as determined by her insurance carrier. She is aware that she may receive a bill and has provided verbal consent to proceed: Yes.     The pt is being evaluated by a phone only visit encounter for concerns as above. A caregiver was present when appropriate. Due to this being a Scientist, physiological (During WEXHB-71 public health emergency), evaluation of the following organ systems was limited: Vitals/Constitutional/EENT/Resp/CV/GI/GU/MS/Neuro/Skin/Heme-Lymph-Imm.   Pursuant to the emergency declaration under the [place], [place] waiver authority and the [employer] Act, this Virtual phone only Visit was conducted, with patient's (and/or legal guardian's) consent, to reduce the patient's risk of exposure to COVID-19 and provide necessary medical care.     Services were provided through a phone only synchronous discussion virtually to substitute for in-person clinic visit. Patient and provider were located at their individual homes.      We discussed  the expected course, resolution and complications of the diagnosis(es) in detail.  Medication risks, benefits, costs, interactions, and alternatives were discussed as indicated.  I advised her to contact the office if her condition worsens, changes or fails to improve as anticipated. She expressed understanding with the diagnosis(es) and plan.     Leo Rod, MD

## 2019-11-28 ENCOUNTER — Encounter: Attending: Internal Medicine | Primary: Family Medicine

## 2019-12-04 ENCOUNTER — Telehealth: Payer: Self-pay

## 2019-12-04 DIAGNOSIS — R8290 Unspecified abnormal findings in urine: Secondary | ICD-10-CM

## 2019-12-04 NOTE — Telephone Encounter (Signed)
[facility] called to set up to come in for lab/urine culture. Order placed. Unable to leave a message, voicemail is full.

## 2019-12-06 ENCOUNTER — Ambulatory Visit: Payer: Medicare Other | Admitting: Family Medicine

## 2019-12-08 LAB — URINE CULTURE
MICRO NUMBER:: 10820376
SPECIMEN QUALITY:: ADEQUATE

## 2019-12-19 ENCOUNTER — Ambulatory Visit: Payer: Medicare Other | Admitting: Family Medicine

## 2020-01-02 ENCOUNTER — Ambulatory Visit: Payer: Medicare Other | Admitting: Family Medicine

## 2020-01-26 NOTE — Telephone Encounter (Signed)
Patient received records from 2014 to present called asking why records prior to 2014 weren't sent also. Explained those records are stored differently in another location. Those records have to be requested by supervisor and may take up to a week to get a response. Will call patient with response.

## 2020-01-30 ENCOUNTER — Ambulatory Visit: Payer: Medicare Other | Admitting: Family Medicine

## 2020-01-30 NOTE — Telephone Encounter (Signed)
pT LVM today 12:31pm asking about medical records request, wants to know the status. Left ph [phone]. Confirmed to patient we are allowing time for supv to respond. Explnd the records are in a warehouse and not quickly accessible.  Asked pt to allow more time and that we will call her back as soon as we get more information.

## 2020-01-31 ENCOUNTER — Ambulatory Visit: Payer: Medicare Other | Admitting: Family Medicine

## 2020-02-21 NOTE — Telephone Encounter (Signed)
Patient paper chart rcvd from [facility] and call was placed to [employer] at [phone].  Altha Harm is going to check on getting a [employer] employee to come copy the large paper file and mail it to the patient. Altha Harm will call us to let us know the next step.

## 2020-02-23 ENCOUNTER — Encounter

## 2020-03-26 ENCOUNTER — Telehealth: Payer: Self-pay | Admitting: Hematology and Oncology

## 2020-03-26 NOTE — Telephone Encounter (Signed)
I received a call from Jodi Kaufman's daughter Jodi Kaufman to schedule a new pt appt.Pt  has a recent dx of stage IV breast cancer who lives in [place]. She will be moving to [facility] to be closer to family and needed to establish care with a new oncologist. She has been scheduled to see Dr. Lindi Adie on 12/6 at 345pm. Appt date and time has been given to the pt's daughter. Aware to arrive 30 minutes early.

## 2020-03-31 NOTE — Progress Notes (Signed)
Jodi Kaufman CONSULT NOTE  Patient Care Team: Hali Marry, MD as PCP - General (Family Medicine)  CHIEF COMPLAINTS/PURPOSE OF CONSULTATION:  Newly diagnosed metastatic breast cancer  HISTORY OF PRESENTING ILLNESS:  [place] 79 y.o. female is here because of a history of metastatic breast cancer. She was diagnosed with stage 2B, ER+, invasive ductal carcinoma of the right breast in 1995, with axillary lymph node involvement. She underwent a mastectomy and completed 2 months of tamoxifen. She was followed for several years but subsequently lost to care until she was seen on 05/26/2019 to re-establish care. At that time, there was no evidence of recurrence. She underwent a CT CAP on 03/11/20 for abdominal pain, which showed extensive, multifocal lytic osseous lesions involving the majority of the visualized vertebral bodies, several ribs, and the pelvis and many lesions with associated soft tissue components. She underwent a biopsy of the right second rib on 03/14/20 for which pathology showed metastatic carcinoma, consistent with breast origin, ER+ 80%, PR-, HER-2 negative (0). She also has a history of uterine cancer. She was under care in [place], but recently moved to [place] to be closer to family. She presents to the clinic today for initial evaluation.   She has lost tremendous amount of weight.  She is been experiencing epigastric discomfort and nausea.  She has been unable to swallow pills.  She rates the pain as 5 out of 10 in her bones.  She has been experiencing constipation for which she has been unable to take senna because she cannot swallow pills.  I reviewed her records extensively and collaborated the history with the patient.  SUMMARY OF ONCOLOGIC HISTORY: Oncology History  Metastatic breast cancer ([place])  1995 Initial Biopsy   Right breast cancer stage IIb ER positive IDC s/p mastectomy and 2 months of tamoxifen, lost to follow-up (treatment at [facility])   03/12/2020 - 03/26/2020 Radiation Therapy   Palliative radiation to the brain and spine for brain metastases   03/14/2020 Initial Diagnosis   Scans on 03/14/2020: Multiple bone metastases and brain metastases, biopsy of the rib: Breast cancer ER 80% PR negative HER-2 negative.  Moved to [place] December 2021 to be closer to her family     MEDICAL HISTORY:  Past Medical History:  Diagnosis Date  . Breast cancer ([place])   History of uterine cancer  SURGICAL HISTORY: Past Surgical History:  Procedure Laterality Date  . BREAST BIOPSY    . MASTECTOMY    . MASTECTOMY W/ SENTINEL NODE BIOPSY     Right. 18 years ago.      SOCIAL HISTORY: Social History   Socioeconomic History  . Marital status: Single    Spouse name: Not on file  . Number of children: Not on file  . Years of education: Not on file  . Highest education level: Not on file  Occupational History  . Not on file  Tobacco Use  . Smoking status: Never Smoker  . Smokeless tobacco: Never Used  Substance and Sexual Activity  . Alcohol use: Never    Comment: [place].    . Drug use: Never  . Sexual activity: Not Currently  Other Topics Concern  . Not on file  Social History Narrative   Lives in [place] but stays with her daughter 1-2 weeks out of the month.     Social Determinants of Health   Financial Resource Strain:   . Difficulty of Paying Living Expenses: Not on file  Food Insecurity:   .  Worried About Charity fundraiser in the Last Year: Not on file  . Ran Out of Food in the Last Year: Not on file  Transportation Needs:   . Lack of Transportation (Medical): Not on file  . Lack of Transportation (Non-Medical): Not on file  Physical Activity:   . Days of Exercise per Week: Not on file  . Minutes of Exercise per Session: Not on file  Stress:   . Feeling of Stress : Not on file  Social Connections:   . Frequency of Communication with Friends and Family: Not on file  . Frequency of Social  Gatherings with Friends and Family: Not on file  . Attends Religious Services: Not on file  . Active Member of Clubs or Organizations: Not on file  . Attends Archivist Meetings: Not on file  . Marital Status: Not on file  Intimate Partner Violence:   . Fear of Current or Ex-Partner: Not on file  . Emotionally Abused: Not on file  . Physically Abused: Not on file  . Sexually Abused: Not on file    FAMILY HISTORY: Family History  Problem Relation Age of Onset  . Heart disease Mother   . Anemia Mother        penicous anemia.   . Alzheimer's disease Mother     ALLERGIES:  is allergic to atorvastatin, clavulanic acid, and tamoxifen.  MEDICATIONS:  Current Outpatient Medications  Medication Sig Dispense Refill  . Calcium Citrate (CITRACAL PO) Take by mouth.    . letrozole (FEMARA) 2.5 MG tablet Take 1 tablet (2.5 mg total) by mouth daily. 90 tablet 3  . Multiple Vitamins-Minerals (CENTRUM SILVER 50+WOMEN PO) Take 1 tablet by mouth daily.    . ondansetron (ZOFRAN ODT) 4 MG disintegrating tablet Take 1 tablet (4 mg total) by mouth every 8 (eight) hours as needed for nausea or vomiting. 30 tablet 3  . palbociclib (IBRANCE) 100 MG capsule Take 1 capsule (100 mg total) by mouth daily with breakfast. Take whole with food. Take for 21 days on, 7 days off, repeat every 28 days. 21 capsule 6   No current facility-administered medications for this visit.    REVIEW OF SYSTEMS:   Constitutional: Profound generalized weakness, fatigue, loss of appetite and loss of weight Complaining of diffuse bone pain Difficulty with swallowing pills epigastric discomfort nausea All other systems were reviewed with the patient and are negative.  PHYSICAL EXAMINATION: ECOG PERFORMANCE STATUS: 3 - Symptomatic, >50% confined to bed  Vitals:   04/01/20 1541  BP: 121/85  Pulse: (!) 120  Resp: 16  Temp: 98.5 F (36.9 C)  SpO2: 98%   Filed Weights   04/01/20 1541  Weight: 104 lb 9.6 oz (47.4  kg)    GENERAL: Generalized fatigue and weakness  LABORATORY DATA:  I have reviewed the data as listed Lab Results  Component Value Date   WBC 7.4 11/09/2019   HGB 15.3 11/09/2019   HCT 47.3 (H) 11/09/2019   MCV 93.3 11/09/2019   PLT 265 11/09/2019   Lab Results  Component Value Date   NA 141 11/09/2019   K 4.3 11/09/2019   CL 103 11/09/2019   CO2 29 11/09/2019    RADIOGRAPHIC STUDIES: I have personally reviewed the radiological reports and agreed with the findings in the report.  ASSESSMENT AND PLAN:  Metastatic breast cancer: Originally diagnosed in 1995, patient did not take prescribed tamoxifen. Now presenting with metastatic disease based on scans done 03/11/2020 CT CAP: Numerous lytic  lesions in the thoracic spine several with expansion extraosseous extension.  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  Uncertain if any radiation was given to the skull lesions.  Treatment plan: Recommend treatment with Ibrance with letrozole. Her major concern is difficulty in swallowing pills. Our pharmacist have confirmed that the pills can be crushed. I would like to start her on Ibrance at the 100 mcg dosage because of her performance status.  Recheck labs in 2 weeks and follow-up. She was consented for the blood draw study through her oral but apparently [facility] does not accept labs drawn at 4:30 PM.  Unfortunately she will not be eligible for that study because she needs to start her treatment today and the labs are to be drawn prior to start of treatment.   All questions were answered. The patient knows to call the clinic with any problems, questions or concerns.   Rulon Eisenmenger, MD, MPH 04/01/2020    I, Molly Dorshimer, am acting as scribe for Nicholas Lose, MD.  I have reviewed the above documentation  for accuracy and completeness, and I agree with the above.

## 2020-04-01 ENCOUNTER — Inpatient Hospital Stay (HOSPITAL_BASED_OUTPATIENT_CLINIC_OR_DEPARTMENT_OTHER): Payer: Medicare Other | Admitting: Hematology and Oncology

## 2020-04-01 ENCOUNTER — Other Ambulatory Visit: Payer: Self-pay

## 2020-04-01 ENCOUNTER — Other Ambulatory Visit: Payer: Self-pay | Admitting: *Deleted

## 2020-04-01 ENCOUNTER — Ambulatory Visit: Payer: Medicare Other | Admitting: Family Medicine

## 2020-04-01 ENCOUNTER — Inpatient Hospital Stay: Payer: Medicare Other | Attending: Hematology and Oncology

## 2020-04-01 VITALS — BP 121/85 | HR 120 | Temp 98.5°F | Resp 16 | Ht 66.0 in | Wt 104.6 lb

## 2020-04-01 DIAGNOSIS — Z923 Personal history of irradiation: Secondary | ICD-10-CM | POA: Insufficient documentation

## 2020-04-01 DIAGNOSIS — Z9011 Acquired absence of right breast and nipple: Secondary | ICD-10-CM | POA: Insufficient documentation

## 2020-04-01 DIAGNOSIS — R630 Anorexia: Secondary | ICD-10-CM | POA: Diagnosis not present

## 2020-04-01 DIAGNOSIS — C50911 Malignant neoplasm of unspecified site of right female breast: Secondary | ICD-10-CM

## 2020-04-01 DIAGNOSIS — Z170 Estrogen receptor positive status [ER+]: Secondary | ICD-10-CM | POA: Insufficient documentation

## 2020-04-01 DIAGNOSIS — G893 Neoplasm related pain (acute) (chronic): Secondary | ICD-10-CM | POA: Insufficient documentation

## 2020-04-01 DIAGNOSIS — C7951 Secondary malignant neoplasm of bone: Secondary | ICD-10-CM | POA: Insufficient documentation

## 2020-04-01 DIAGNOSIS — Z79811 Long term (current) use of aromatase inhibitors: Secondary | ICD-10-CM | POA: Insufficient documentation

## 2020-04-01 DIAGNOSIS — Z853 Personal history of malignant neoplasm of breast: Secondary | ICD-10-CM

## 2020-04-01 DIAGNOSIS — Z8542 Personal history of malignant neoplasm of other parts of uterus: Secondary | ICD-10-CM | POA: Insufficient documentation

## 2020-04-01 DIAGNOSIS — R531 Weakness: Secondary | ICD-10-CM | POA: Insufficient documentation

## 2020-04-01 DIAGNOSIS — C7931 Secondary malignant neoplasm of brain: Secondary | ICD-10-CM | POA: Diagnosis not present

## 2020-04-01 DIAGNOSIS — R1310 Dysphagia, unspecified: Secondary | ICD-10-CM | POA: Diagnosis not present

## 2020-04-01 DIAGNOSIS — K5900 Constipation, unspecified: Secondary | ICD-10-CM | POA: Insufficient documentation

## 2020-04-01 DIAGNOSIS — R110 Nausea: Secondary | ICD-10-CM | POA: Insufficient documentation

## 2020-04-01 DIAGNOSIS — C50919 Malignant neoplasm of unspecified site of unspecified female breast: Secondary | ICD-10-CM

## 2020-04-01 LAB — CMP (CANCER CENTER ONLY)
ALT: 74 U/L — ABNORMAL HIGH (ref 0–44)
AST: 44 U/L — ABNORMAL HIGH (ref 15–41)
Albumin: 4 g/dL (ref 3.5–5.0)
Alkaline Phosphatase: 592 U/L — ABNORMAL HIGH (ref 38–126)
Anion gap: 16 — ABNORMAL HIGH (ref 5–15)
BUN: 27 mg/dL — ABNORMAL HIGH (ref 8–23)
CO2: 20 mmol/L — ABNORMAL LOW (ref 22–32)
Calcium: 9.9 mg/dL (ref 8.9–10.3)
Chloride: 103 mmol/L (ref 98–111)
Creatinine: 0.77 mg/dL (ref 0.44–1.00)
GFR, Estimated: >60 mL/min (ref >60)
Glucose, Bld: 127 mg/dL — ABNORMAL HIGH (ref 70–99)
Potassium: 4.4 mmol/L (ref 3.5–5.1)
Sodium: 139 mmol/L (ref 135–145)
Total Bilirubin: 2.8 mg/dL — ABNORMAL HIGH (ref 0.3–1.2)
Total Protein: 6.9 g/dL (ref 6.5–8.1)

## 2020-04-01 LAB — CBC WITH DIFFERENTIAL (CANCER CENTER ONLY)
Abs Immature Granulocytes: 0.05 10*3/uL (ref 0.00–0.07)
Basophils Absolute: 0 10*3/uL (ref 0.0–0.1)
Basophils Relative: 0 %
Eosinophils Absolute: 0 10*3/uL (ref 0.0–0.5)
Eosinophils Relative: 0 %
HCT: 53.8 % — ABNORMAL HIGH (ref 36.0–46.0)
Hemoglobin: 17.9 g/dL — ABNORMAL HIGH (ref 12.0–15.0)
Immature Granulocytes: 1 %
Lymphocytes Relative: 2 %
Lymphs Abs: 0.2 10*3/uL — ABNORMAL LOW (ref 0.7–4.0)
MCH: 30 pg (ref 26.0–34.0)
MCHC: 33.3 g/dL (ref 30.0–36.0)
MCV: 90.3 fL (ref 80.0–100.0)
Monocytes Absolute: 0.7 10*3/uL (ref 0.1–1.0)
Monocytes Relative: 8 %
Neutro Abs: 8.5 10*3/uL — ABNORMAL HIGH (ref 1.7–7.7)
Neutrophils Relative %: 89 %
Platelet Count: 125 10*3/uL — ABNORMAL LOW (ref 150–400)
RBC: 5.96 MIL/uL — ABNORMAL HIGH (ref 3.87–5.11)
RDW: 16.3 % — ABNORMAL HIGH (ref 11.5–15.5)
WBC Count: 9.5 10*3/uL (ref 4.0–10.5)
nRBC: 0 % (ref 0.0–0.2)

## 2020-04-01 MED ORDER — PALBOCICLIB 100 MG PO CAPS
100.0000 mg | ORAL_CAPSULE | Freq: Every day | ORAL | 6 refills | Status: DC
Start: 2020-04-01 — End: 2020-04-02

## 2020-04-01 MED ORDER — LETROZOLE 2.5 MG PO TABS: 2.5000 mg | ORAL_TABLET | Freq: Every day | ORAL | 3 refills | Status: DC

## 2020-04-01 MED ORDER — ONDANSETRON 4 MG PO TBDP: 4.0000 mg | ORAL_TABLET | Freq: Three times a day (TID) | ORAL | 3 refills | Status: DC | PRN

## 2020-04-01 MED ORDER — PALBOCICLIB 100 MG PO CAPS: 100.0000 mg | ORAL_CAPSULE | Freq: Every day | ORAL | 6 refills | Status: DC

## 2020-04-02 ENCOUNTER — Telehealth: Payer: Self-pay

## 2020-04-02 ENCOUNTER — Ambulatory Visit: Payer: Medicare Other | Admitting: Family Medicine

## 2020-04-02 ENCOUNTER — Telehealth: Payer: Self-pay | Admitting: Pharmacist

## 2020-04-02 DIAGNOSIS — C50919 Malignant neoplasm of unspecified site of unspecified female breast: Secondary | ICD-10-CM

## 2020-04-02 MED ORDER — PALBOCICLIB 100 MG PO TABS: 100.0000 mg | ORAL_TABLET | Freq: Every day | ORAL | 6 refills | Status: DC

## 2020-04-02 NOTE — Telephone Encounter (Signed)
Oral Oncology Patient Advocate Encounter  Received notification from [employer] that prior authorization for [name] is required.  PA submitted on CoverMyMeds Key [id number] Status is pending  Oral Oncology Clinic will continue to follow.  [facility] Patient [place] Phone [phone] Fax [phone] 04/02/2020 8:14 AM

## 2020-04-02 NOTE — Telephone Encounter (Signed)
Oral Oncology Pharmacist Encounter  Received new prescription for Ibrance (palbociclib) for the treatment of ER+/PR-, HER-2 negative metastatic breast cancer in conjunction with letrozole, planned duration until disease progression or unacceptable drug toxicity.  Prescription dose and frequency assessed for appropriateness. Patient starting on dose reduction per MD. Appropriate for therapy initiation.   CMP and CBC w/ Diff from 04/01/20 assessed, noted pltc of 125 K/uL, as well as elevated LFTs (ALT 74 U/L) and T. Bili 2.8 mg/dL, alk phos 592 U/L, likely 2/2 lytic lesions. No further dose adjustments required at this time.   Current medication list in Epic reviewed, no relevant/significant DDIs with Ibrance identified.  Evaluated chart and no patient barriers to medication adherence noted.   Prescription has been e-scribed to the [facility] for benefits analysis and approval.  Oral Oncology Clinic will continue to follow for insurance authorization, copayment issues, initial counseling and start date.  Rebecca Fanning, PharmD, BCPS Hematology/Oncology Clinical Pharmacist [place] [facility] [phone] 04/02/2020 8:21 AM   

## 2020-04-02 NOTE — Telephone Encounter (Signed)
Oral Oncology Patient Advocate Encounter  Prior Authorization for Jodi Kaufman has been approved.    PA# [id number] Effective dates: 04/02/20 through 04/26/21  Patients co-pay is $2876  Oral Oncology Clinic will continue to follow.   [facility] Patient [place] Phone [phone] Fax [phone] 04/02/2020 8:22 AM

## 2020-04-04 ENCOUNTER — Telehealth: Payer: Self-pay | Admitting: Hematology and Oncology

## 2020-04-04 ENCOUNTER — Telehealth: Payer: Self-pay | Admitting: Licensed Clinical Social Worker

## 2020-04-04 ENCOUNTER — Telehealth: Payer: Self-pay | Admitting: *Deleted

## 2020-04-04 NOTE — Telephone Encounter (Signed)
[facility] Clinical Social Work   CSW attempted to contact new patient to introduce self and support services. No answer. Unable to leave VM as mailbox full. Patient may be referred to support services in the future as needed.   Christeen Douglas, LCSW

## 2020-04-04 NOTE — Telephone Encounter (Signed)
Scheduled per 12/6 los. Called pt no answer and unable to leave a msg

## 2020-04-04 NOTE — Telephone Encounter (Signed)
This RN returned call per VM left by the patient's daughter per her VM stating concerns with " she is not drinking well "  Per discussion with Anderson Malta- she states overall status is about the same since establishing care on 04/01/2020.  She states she mom has " think mucus that she has to rinse and spit out even to drink "  She states her mom is only taking in non milk liquids " and maybe getting 50 calories a day "  This RN discussed above - and possible benefit by use of a teaspoon of coconut oil she can swish and swallow to help moisturize her mucous membranes. She can do this as often as she likes.Family needs to find any liquid that the patient likes and just have her sip all day long - little by little.  Discussed concerns patient may have relating to drinking and urinating ( too weak to want to get up to the bathroom) Anderson Malta stated understanding of above.  Discussed concern for current status due to known diagnosis- and hopefully use of oral chemo will give patient better overall function.  Noted pt is approved for Ibrance but has a high co pay.  Anderson Malta states she was informed of the above and has the information needed to proceed with assistance as discussed by oral phx tech.  Anderson Malta states she will bring the information and her mom up here tomorrow to proceed with applying for assistance.  This RN validated concerns and hope for benefit with treatment as well as if needed to ask to see nurse tomorrow if they have further concerns.  No further needs at this time.

## 2020-04-08 ENCOUNTER — Telehealth: Payer: Self-pay | Admitting: *Deleted

## 2020-04-08 DIAGNOSIS — C50919 Malignant neoplasm of unspecified site of unspecified female breast: Secondary | ICD-10-CM

## 2020-04-08 NOTE — Telephone Encounter (Signed)
Received call from pt daughter Arville Go stating pt has a decreased appetite and is only consuming 300-400 calories a day.  States pt complains of severe dry and sore mouth.  Denies fever.  Per MD pt needing to have lab work drawn and be evaluated in [facility].  Apt scheduled and pt daughter verbalized understanding of apt date and time.

## 2020-04-09 ENCOUNTER — Other Ambulatory Visit: Payer: Medicare Other

## 2020-04-09 ENCOUNTER — Encounter: Payer: Medicare Other | Admitting: Medical

## 2020-04-10 ENCOUNTER — Encounter (HOSPITAL_COMMUNITY): Payer: Self-pay

## 2020-04-10 ENCOUNTER — Telehealth: Payer: Self-pay

## 2020-04-10 ENCOUNTER — Other Ambulatory Visit: Payer: Medicare Other

## 2020-04-10 ENCOUNTER — Encounter: Payer: Medicare Other | Admitting: Medical

## 2020-04-10 ENCOUNTER — Emergency Department (HOSPITAL_COMMUNITY): Payer: Medicare Other

## 2020-04-10 ENCOUNTER — Inpatient Hospital Stay (HOSPITAL_COMMUNITY)
Admission: EM | Admit: 2020-04-10 | Discharge: 2020-04-18 | DRG: 640 | Disposition: A | Discharge status: Home Health | Payer: Medicare Other | Attending: Internal Medicine | Admitting: Internal Medicine

## 2020-04-10 ENCOUNTER — Observation Stay (HOSPITAL_COMMUNITY): Payer: Medicare Other

## 2020-04-10 DIAGNOSIS — Z923 Personal history of irradiation: Secondary | ICD-10-CM

## 2020-04-10 DIAGNOSIS — Z515 Encounter for palliative care: Secondary | ICD-10-CM

## 2020-04-10 DIAGNOSIS — R112 Nausea with vomiting, unspecified: Secondary | ICD-10-CM | POA: Diagnosis present

## 2020-04-10 DIAGNOSIS — C7931 Secondary malignant neoplasm of brain: Secondary | ICD-10-CM | POA: Diagnosis present

## 2020-04-10 DIAGNOSIS — E870 Hyperosmolality and hypernatremia: Secondary | ICD-10-CM | POA: Diagnosis present

## 2020-04-10 DIAGNOSIS — Z9011 Acquired absence of right breast and nipple: Secondary | ICD-10-CM

## 2020-04-10 DIAGNOSIS — R748 Abnormal levels of other serum enzymes: Secondary | ICD-10-CM

## 2020-04-10 DIAGNOSIS — M25559 Pain in unspecified hip: Secondary | ICD-10-CM | POA: Diagnosis present

## 2020-04-10 DIAGNOSIS — R1011 Right upper quadrant pain: Secondary | ICD-10-CM | POA: Diagnosis present

## 2020-04-10 DIAGNOSIS — K219 Gastro-esophageal reflux disease without esophagitis: Secondary | ICD-10-CM | POA: Diagnosis present

## 2020-04-10 DIAGNOSIS — R1310 Dysphagia, unspecified: Secondary | ICD-10-CM | POA: Diagnosis not present

## 2020-04-10 DIAGNOSIS — R7989 Other specified abnormal findings of blood chemistry: Secondary | ICD-10-CM | POA: Diagnosis present

## 2020-04-10 DIAGNOSIS — Z20822 Contact with and (suspected) exposure to covid-19: Secondary | ICD-10-CM | POA: Diagnosis present

## 2020-04-10 DIAGNOSIS — F411 Generalized anxiety disorder: Secondary | ICD-10-CM | POA: Diagnosis present

## 2020-04-10 DIAGNOSIS — Z681 Body mass index (BMI) 19 or less, adult: Secondary | ICD-10-CM

## 2020-04-10 DIAGNOSIS — E860 Dehydration: Secondary | ICD-10-CM | POA: Diagnosis not present

## 2020-04-10 DIAGNOSIS — Z853 Personal history of malignant neoplasm of breast: Secondary | ICD-10-CM

## 2020-04-10 DIAGNOSIS — Z888 Allergy status to other drugs, medicaments and biological substances status: Secondary | ICD-10-CM

## 2020-04-10 DIAGNOSIS — Z431 Encounter for attention to gastrostomy: Secondary | ICD-10-CM

## 2020-04-10 DIAGNOSIS — Z931 Gastrostomy status: Secondary | ICD-10-CM

## 2020-04-10 DIAGNOSIS — K828 Other specified diseases of gallbladder: Secondary | ICD-10-CM | POA: Diagnosis present

## 2020-04-10 DIAGNOSIS — C7951 Secondary malignant neoplasm of bone: Secondary | ICD-10-CM | POA: Diagnosis present

## 2020-04-10 DIAGNOSIS — R627 Adult failure to thrive: Secondary | ICD-10-CM | POA: Diagnosis present

## 2020-04-10 DIAGNOSIS — Z170 Estrogen receptor positive status [ER+]: Secondary | ICD-10-CM

## 2020-04-10 DIAGNOSIS — Z8249 Family history of ischemic heart disease and other diseases of the circulatory system: Secondary | ICD-10-CM

## 2020-04-10 DIAGNOSIS — E876 Hypokalemia: Secondary | ICD-10-CM | POA: Diagnosis present

## 2020-04-10 DIAGNOSIS — E785 Hyperlipidemia, unspecified: Secondary | ICD-10-CM | POA: Diagnosis present

## 2020-04-10 DIAGNOSIS — C50919 Malignant neoplasm of unspecified site of unspecified female breast: Secondary | ICD-10-CM

## 2020-04-10 DIAGNOSIS — E43 Unspecified severe protein-calorie malnutrition: Secondary | ICD-10-CM | POA: Insufficient documentation

## 2020-04-10 DIAGNOSIS — Z820 Family history of epilepsy and other diseases of the nervous system: Secondary | ICD-10-CM

## 2020-04-10 DIAGNOSIS — R7401 Elevation of levels of liver transaminase levels: Secondary | ICD-10-CM | POA: Diagnosis present

## 2020-04-10 DIAGNOSIS — N179 Acute kidney failure, unspecified: Secondary | ICD-10-CM | POA: Diagnosis present

## 2020-04-10 LAB — CBC WITH DIFFERENTIAL/PLATELET
Abs Immature Granulocytes: 0.04 10*3/uL (ref 0.00–0.07)
Basophils Absolute: 0 10*3/uL (ref 0.0–0.1)
Basophils Relative: 0 %
Eosinophils Absolute: 0 10*3/uL (ref 0.0–0.5)
Eosinophils Relative: 0 %
HCT: 60.3 % — ABNORMAL HIGH (ref 36.0–46.0)
Hemoglobin: 19.7 g/dL — ABNORMAL HIGH (ref 12.0–15.0)
Immature Granulocytes: 1 %
Lymphocytes Relative: 3 %
Lymphs Abs: 0.2 10*3/uL — ABNORMAL LOW (ref 0.7–4.0)
MCH: 30.1 pg (ref 26.0–34.0)
MCHC: 32.7 g/dL (ref 30.0–36.0)
MCV: 92.1 fL (ref 80.0–100.0)
Monocytes Absolute: 0.7 10*3/uL (ref 0.1–1.0)
Monocytes Relative: 10 %
Neutro Abs: 5.5 10*3/uL (ref 1.7–7.7)
Neutrophils Relative %: 86 %
Platelets: 279 10*3/uL (ref 150–400)
RBC: 6.55 MIL/uL — ABNORMAL HIGH (ref 3.87–5.11)
RDW: 18.2 % — ABNORMAL HIGH (ref 11.5–15.5)
WBC: 6.4 10*3/uL (ref 4.0–10.5)
nRBC: 0 % (ref 0.0–0.2)

## 2020-04-10 LAB — COMPREHENSIVE METABOLIC PANEL
ALT: 82 U/L — ABNORMAL HIGH (ref 0–44)
AST: 55 U/L — ABNORMAL HIGH (ref 15–41)
Albumin: 4 g/dL (ref 3.5–5.0)
Alkaline Phosphatase: 613 U/L — ABNORMAL HIGH (ref 38–126)
Anion gap: 19 — ABNORMAL HIGH (ref 5–15)
BUN: 65 mg/dL — ABNORMAL HIGH (ref 8–23)
CO2: 21 mmol/L — ABNORMAL LOW (ref 22–32)
Calcium: 10 mg/dL (ref 8.9–10.3)
Chloride: 102 mmol/L (ref 98–111)
Creatinine, Ser: 1.01 mg/dL — ABNORMAL HIGH (ref 0.44–1.00)
GFR, Estimated: 57 mL/min — ABNORMAL LOW (ref >60)
Glucose, Bld: 163 mg/dL — ABNORMAL HIGH (ref 70–99)
Potassium: 3.8 mmol/L (ref 3.5–5.1)
Sodium: 142 mmol/L (ref 135–145)
Total Bilirubin: 2 mg/dL — ABNORMAL HIGH (ref 0.3–1.2)
Total Protein: 7 g/dL (ref 6.5–8.1)

## 2020-04-10 LAB — LACTIC ACID, PLASMA
Lactic Acid, Venous: 3.6 mmol/L (ref 0.5–1.9)
Lactic Acid, Venous: 2.2 mmol/L (ref 0.5–1.9)
Lactic Acid, Venous: 1.8 mmol/L (ref 0.5–1.9)

## 2020-04-10 LAB — RESP PANEL BY RT-PCR (FLU A&B, COVID) ARPGX2
Influenza A by PCR: NEGATIVE
Influenza B by PCR: NEGATIVE
SARS Coronavirus 2 by RT PCR: NEGATIVE

## 2020-04-10 MED ORDER — SODIUM CHLORIDE 0.9% FLUSH
3.0000 mL | Freq: Two times a day (BID) | INTRAVENOUS | Status: DC
  Administered 2020-04-10 – 2020-04-18 (×10): 3 mL via INTRAVENOUS

## 2020-04-10 MED ORDER — ENSURE ENLIVE PO LIQD: 237.0000 mL | Freq: Two times a day (BID) | ORAL | Status: DC

## 2020-04-10 MED ORDER — POLYETHYLENE GLYCOL 3350 17 G PO PACK: 17.0000 g | PACK | Freq: Every day | ORAL | Status: DC | PRN

## 2020-04-10 MED ORDER — LORAZEPAM 0.5 MG PO TABS: 0.5000 mg | ORAL_TABLET | Freq: Four times a day (QID) | ORAL | Status: DC | PRN

## 2020-04-10 MED ORDER — LACTATED RINGERS IV BOLUS
1000.0000 mL | Freq: Once | INTRAVENOUS | Status: AC
  Administered 2020-04-10: 1000 mL via INTRAVENOUS

## 2020-04-10 MED ORDER — ONDANSETRON HCL 4 MG PO TABS: 4.0000 mg | ORAL_TABLET | Freq: Four times a day (QID) | ORAL | Status: DC | PRN

## 2020-04-10 MED ORDER — DEXAMETHASONE SODIUM PHOSPHATE 4 MG/ML IJ SOLN
4.0000 mg | Freq: Once | INTRAMUSCULAR | Status: AC
  Administered 2020-04-10: 4 mg via INTRAVENOUS
  Filled 2020-04-10: qty 1

## 2020-04-10 MED ORDER — DEXTROSE IN LACTATED RINGERS 5 % IV SOLN: INTRAVENOUS | Status: DC

## 2020-04-10 MED ORDER — PANTOPRAZOLE SODIUM 40 MG PO TBEC
40.0000 mg | DELAYED_RELEASE_TABLET | Freq: Every day | ORAL | Status: DC
  Filled 2020-04-10 (×3): qty 1

## 2020-04-10 MED ORDER — LETROZOLE 2.5 MG PO TABS
2.5000 mg | ORAL_TABLET | Freq: Every day | ORAL | Status: DC
  Filled 2020-04-10 (×3): qty 1

## 2020-04-10 MED ORDER — BIOTENE DRY MOUTH MT LIQD
15.0000 mL | OROMUCOSAL | Status: DC | PRN
  Administered 2020-04-15: 15 mL via OROMUCOSAL
  Filled 2020-04-10: qty 15

## 2020-04-10 MED ORDER — ENOXAPARIN SODIUM 40 MG/0.4ML ~~LOC~~ SOLN
40.0000 mg | SUBCUTANEOUS | Status: DC
  Administered 2020-04-10 – 2020-04-13 (×4): 40 mg via SUBCUTANEOUS
  Filled 2020-04-10 (×4): qty 0.4

## 2020-04-10 MED ORDER — ACETAMINOPHEN 650 MG RE SUPP: 650.0000 mg | Freq: Four times a day (QID) | RECTAL | Status: DC | PRN

## 2020-04-10 MED ORDER — ACETAMINOPHEN 325 MG PO TABS: 650.0000 mg | ORAL_TABLET | Freq: Four times a day (QID) | ORAL | Status: DC | PRN

## 2020-04-10 MED ORDER — ONDANSETRON HCL 4 MG/2ML IJ SOLN: 4.0000 mg | Freq: Four times a day (QID) | INTRAMUSCULAR | Status: DC | PRN

## 2020-04-10 NOTE — ED Provider Notes (Addendum)
[facility] DEPT Provider Note   CSN: [id number] Arrival date & time: 04/10/20  1130     History Chief Complaint  Patient presents with  . Decreased Appetite  . Fatigue    Jodi Kaufman is a 79 y.o. female.  Patient is a 79 year old female with a history of metastatic breast cancer, prior surgical cancer who is presenting today with complaint of inability to eat.  Patient was discharged several weeks ago from [facility] in [place].  At that time patient had come in complaining of back and hip pain and was found to have metastatic breast cancer with multiple bony lesions.  She did receive radiation while present there and reports that the radiation made her feel very weak and she did not feel like she was ready to go home but was discharged and came down here to stay with her daughter.  Since she has been staying with her daughter she reports that she is unable to eat.  She tries to drink fluids and eat but feels like it gets stuck in her throat and makes her cough as well as causes her to regurgitate.  Her mouth feels very dry and her throat is sore.  She denies any shortness of breath or abdominal pain.  She is been having infrequent bowel movements but they have been soft.  She denies any fever and currently is not taking any chemotherapy.  Daughter reports that she is only taking in 2 to 300 cal/day because she is mostly refusing to eat.  Patient states her urine is very dark but no dysuria.  She does report feeling dizzy and lightheaded with any prolonged standing or activity.  She has been feeling generally weak and tired.  The history is provided by the patient and medical records.       Past Medical History:  Diagnosis Date  . Breast cancer [facility])     Patient Active Problem List   Diagnosis Date Noted  . Metastatic breast cancer ([place]) 04/01/2020  . RUQ pain 11/09/2019  . Abnormal weight loss 11/09/2019  . Family history of colon cancer  08/16/2019  . 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 Rheumatoid factor positive 07/06/2019  . Recurrent UTI 05/17/2019  . Hyperlipidemia 05/15/2018  . GAD (generalized anxiety disorder) 02/09/2018  . History of osteopenia 11/27/2015  . GERD (gastroesophageal reflux disease) 11/27/2015  . Vitamin D deficiency 11/06/2011  . Overactive bladder 10/12/2011  . History of right breast cancer 10/12/2011    Past Surgical History:  Procedure Laterality Date  . BREAST BIOPSY    . MASTECTOMY    . MASTECTOMY W/ SENTINEL NODE BIOPSY     Right. 18 years ago.       OB History   No obstetric history on file.     Family History  Problem Relation Age of Onset  . Heart disease Mother   . Anemia Mother        penicous anemia.   . Alzheimer's disease Mother     Social History   Tobacco Use  . Smoking status: Never Smoker  . Smokeless tobacco: Never Used  Substance Use Topics  . Alcohol use: Never    Comment: [place].    . Drug use: Never    Home Medications Prior to Admission medications   Medication Sig Start Date End Date Taking? Authorizing Provider  Calcium Citrate (CITRACAL PO) Take by mouth.    [provider]  letrozole [facility]) 2.5  MG tablet Take 1 tablet (2.5 mg total) by mouth daily. 04/01/20   Nicholas Lose, MD  Multiple Vitamins-Minerals (CENTRUM SILVER 50+WOMEN PO) Take 1 tablet by mouth daily.    [provider]  ondansetron (ZOFRAN ODT) 4 MG disintegrating tablet Take 1 tablet (4 mg total) by mouth every 8 (eight) hours as needed for nausea or vomiting. 04/01/20   Nicholas Lose, MD  palbociclib (IBRANCE) 100 MG tablet Take 1 tablet (100 mg total) by mouth daily. Take for 21 days on, 7 days off, repeat every 28 days. 04/02/20   Nicholas Lose, MD    Allergies    Atorvastatin, Clavulanic acid, and Tamoxifen  Review of Systems   Review of Systems  All other systems reviewed and are negative.   Physical Exam Updated  Vital Signs BP 119/86   Pulse (!) 105   Temp 97.9 F (36.6 C) (Axillary)   Resp 20   SpO2 100%   Physical Exam Vitals and nursing note reviewed.  Constitutional:      General: She is not in acute distress.    Appearance: She is well-developed and well-nourished. She is cachectic.  HENT:     Head: Normocephalic and atraumatic.     Mouth/Throat:     Mouth: Mucous membranes are dry.     Pharynx: Posterior oropharyngeal erythema present.     Comments: Diffuse erythema without localized lesions Eyes:     Extraocular Movements: EOM normal.     Pupils: Pupils are equal, round, and reactive to light.  Cardiovascular:     Rate and Rhythm: Regular rhythm. Tachycardia present.     Pulses: Normal pulses and intact distal pulses.     Heart sounds: Normal heart sounds. No murmur heard. No friction rub.  Pulmonary:     Effort: Pulmonary effort is normal.     Breath sounds: Normal breath sounds. No wheezing or rales.  Abdominal:     General: Bowel sounds are normal. There is no distension.     Palpations: Abdomen is soft.     Tenderness: There is no abdominal tenderness. There is no guarding or rebound.  Musculoskeletal:        General: No tenderness. Normal range of motion.     Cervical back: Normal range of motion and neck supple. No tenderness.     Right lower leg: No edema.     Left lower leg: No edema.     Comments: No edema  Skin:    General: Skin is warm and dry.     Findings: No rash.  Neurological:     General: No focal deficit present.     Mental Status: She is alert and oriented to person, place, and time. Mental status is at baseline.     Cranial Nerves: No cranial nerve deficit.  Psychiatric:        Mood and Affect: Mood and affect and mood normal.        Behavior: Behavior normal.        Thought Content: Thought content normal.     ED Results / Procedures / Treatments   Labs (all labs ordered are listed, but only abnormal results are displayed) Labs Reviewed   CBC WITH DIFFERENTIAL/PLATELET - Abnormal; Notable for the following components:      Result Value   RBC 6.55 (*)    Hemoglobin 19.7 (*)    HCT 60.3 (*)    RDW 18.2 (*)    Lymphs Abs 0.2 (*)    All other components  within normal limits  COMPREHENSIVE METABOLIC PANEL - Abnormal; Notable for the following components:   CO2 21 (*)    Glucose, Bld 163 (*)    BUN 65 (*)    Creatinine, Ser 1.01 (*)    AST 55 (*)    ALT 82 (*)    Alkaline Phosphatase 613 (*)    Total Bilirubin 2.0 (*)    GFR, Estimated 57 (*)    Anion gap 19 (*)    All other components within normal limits  LACTIC ACID, PLASMA - Abnormal; Notable for the following components:   Lactic Acid, Venous 3.6 (*)    All other components within normal limits  URINALYSIS, ROUTINE W REFLEX MICROSCOPIC    EKG EKG Interpretation  Date/Time:  Wednesday April 10 2020 13:14:17 EST Ventricular Rate:  107 PR Interval:    QRS Duration: 87 QT Interval:  326 QTC Calculation: 435 R Axis:   9 Text Interpretation: Sinus tachycardia Biatrial enlargement ST elevation, consider inferior injury No previous tracing Confirmed by Blanchie Dessert [phone]) on 04/10/2020 1:34:39 PM   Radiology No results found.  Procedures Procedures (including critical care time)  Medications Ordered in ED Medications  lactated ringers bolus 1,000 mL (1,000 mLs Intravenous New Bag/Given 04/10/20 1302)    ED Course  I have reviewed the triage vital signs and the nursing notes.  Pertinent labs & imaging results that were available during my care of the patient were reviewed by me and considered in my medical decision making (see chart for details).    MDM Rules/Calculators/A&P                          Elderly female presenting today with inability to eat due to vomiting and poor oral intake.  Also having lightheadedness and dizziness with standing.  Upon arrival patient is tachycardic and appears dehydrated.  She had recently finished multiple  rounds of radiation but is not currently on any chemotherapy.  She denies any infectious symptoms.  He has no localized abdominal pain and based on imaging that was done within the last month there is no evidence of metastatic lesions in the abdomen other than bony metastases.  Patient has no evidence of thrush at this time and is breathing comfortably.  Saturations of 100% on room air.  Concern for severe dehydration due to failure to thrive.  On labs today CBC is hemoconcentrated with a hemoglobin of 19 but otherwise normal white count and platelet count, CMP with creatinine of 1, mild elevated LFTs and anion gap of 19.  Patient has a lactic acid of 3.6 and EKG shows sinus tachycardia.  Patient given IV fluids and nausea medicine.  Feel that patient will most likely need admission for severe dehydration. Sent a message to Dr. Lindi Adie as well to inform him of pt's admission.  Patient also reports she has not been on her Decadron since leaving the hospital and given she is having recurrent vomiting and poor tolerability of food we will do a new head CT to ensure there is no worsening edema.  Patient given another liter of fluid and feel that she will need admission.  MDM Number of Diagnoses or Management Options   Amount and/or Complexity of Data Reviewed Clinical lab tests: ordered and reviewed Tests in the medicine section of CPT: reviewed and ordered Decide to obtain previous medical records or to obtain history from someone other than the patient: yes Obtain history from someone other than  the patient: yes Review and summarize past medical records: yes Discuss the patient with other providers: yes Independent visualization of images, tracings, or specimens: yes  Risk of Complications, Morbidity, and/or Mortality Presenting problems: moderate  Patient Progress Patient progress: improved    Final Clinical Impression(s) / ED Diagnoses Final diagnoses:  Dehydration  Failure to thrive in  adult    Rx / DC Orders ED Discharge Orders    None       Blanchie Dessert, MD 04/10/20 1424    Blanchie Dessert, MD 04/10/20 1448

## 2020-04-10 NOTE — ED Notes (Signed)
Placed pt on purewick  

## 2020-04-10 NOTE — Telephone Encounter (Signed)
Oral Oncology Patient Advocate Encounter  Met patient in [place] to complete application for [employer] in an effort to reduce patient's out of pocket expense for [place] to $0.    Application completed and faxed to [phone].   [employer] patient assistance phone number for follow up is [phone].   This encounter will be updated until final determination.   [place] Patient [place] Phone [phone] Fax [phone] 04/10/2020 4:24 PM

## 2020-04-10 NOTE — ED Notes (Signed)
Date and time results received: 04/10/20 1352 (use smartphrase ".now" to insert current time)  Test: latic acid Critical Value: 3.6  Name of Provider Notified: Blanchie Dessert  Orders Received? Or Actions Taken?:

## 2020-04-10 NOTE — ED Triage Notes (Signed)
Pt presents with c/o fatigue and decreased appetite. Pt reports she is a cancer pt and is unable to eat or drink anything.

## 2020-04-10 NOTE — Telephone Encounter (Signed)
Received call from patient's daughter that she is running late to appointment to [facility].   RN spoke with daughter, daughter is reporting that patient is extremely weak, not drinking fluids, food intake is decreased significantly.     Pt is alert, requiring assistance with ambulation.  Denies any shortness of breath.    RN reviewed with Sandi Mealy, PA-C, recommendations to proceed to ED for evaluation vs [facility].    Daughter notified, and verbalized understanding and agreement.

## 2020-04-10 NOTE — ED Notes (Signed)
Patient transported to CT 

## 2020-04-10 NOTE — ED Notes (Signed)
Pt is still unable to provide urine sample at this time. 

## 2020-04-10 NOTE — ED Notes (Signed)
Visitor to the room

## 2020-04-10 NOTE — ED Notes (Signed)
Asked pt if she could use the bathroom pt stated not at the moment

## 2020-04-10 NOTE — H&P (Signed)
History and Physical    Jodi Kaufman XNT:[id number] DOB: [birth year] DOA: 04/10/2020  PCP: Hali Marry, MD   Patient coming from: Home  I have personally briefly reviewed patient's old medical records in [place]  Chief Complaint: Dry mouth and poor p.o. intake.  HPI: Jodi Kaufman is a 79 y.o. female with medical history significant of metastatic breast cancer, vitamin D deficiency, mild emphysema, generalized anxiety disorder, hyperlipidemia and GERD came to [facility] ED with complaint of nausea, vomiting and poor p.o. intake. Patient was discharged several weeks ago from [facility] in [place].  At that time patient had come in complaining of back and hip pain and was found to have metastatic breast cancer with multiple bony lesions.  She did receive radiation while present there and reports that the radiation made her feel very weak and she did not feel like she was ready to go home but was discharged and came down here to stay with her daughter.  Since she has been staying with her daughter she reports that she is unable to eat.  She tries to drink fluids and eat but feels like it gets stuck in her throat and makes her cough as well as causes her to regurgitate.   Patient was very irritated and refusing most of the treatment when seen today, stating that my body is very weak and cannot tolerate anything.  Daughter at bedside.  According to her she is not eating or drinking much, daughter was also concerned that her urine was very dark brown this morning when she changed her diaper. Patient denies nausea or vomiting with me but continued to spit stating that she just feeling very dry mouth and phlegm in her throat.  Denies any cough.  No fever or chills.  She was having some right upper quadrant and central abdominal pain.  She is experiencing some constipation stating that she is not eating enough to have a bowel movement.  Having small bowel movements every other day.  Denies  any dysuria or hematuria.  Patient also has a lot of underlying anxiety per daughter.  Per daughter patient is losing weight for more than a year, a month before she developed hip pain initially thought to be sciatica, with worsening pain she had CT scan which shows bone lesions.  Biopsy confirmed metastatic breast cancer. Patient has a history of breast cancer 27-years ago s/p mastectomy and never had any problem since then.  Patient saw Dr. Lindi Adie from oncology once and was started on 2 medications which she has not taken yet.  It is refusing most of the medications stating that she just want her nutrition through IV and will not take any other medication.  ED Course: Hemodynamically stable, labs consistent with dehydration with elevated hemoglobin, BUN of 65, creatinine of 1.01, CO2 of 21, AST 55, ALT 82, alkaline phosphatase 613, T bili of 2 and lactic acid of 3.6. Oncology was consulted from ED and hospitalist service to admit for poor p.o. intake and worsening weakness.  Review of Systems: As per HPI otherwise 10 point review of systems negative.   Past Medical History:  Diagnosis Date  . Breast cancer [facility])     Past Surgical History:  Procedure Laterality Date  . BREAST BIOPSY    . MASTECTOMY    . MASTECTOMY W/ SENTINEL NODE BIOPSY     Right. 18 years ago.       reports that she has never smoked. She has never used smokeless tobacco.  She reports that she does not drink alcohol and does not use drugs.  Allergies  Allergen Reactions  . Atorvastatin     Other reaction(s): Other (comments) Side effects  . Clavulanic Acid Hives  . Tamoxifen Nausea Only    Family History  Problem Relation Age of Onset  . Heart disease Mother   . Anemia Mother        penicous anemia.   . Alzheimer's disease Mother     Prior to Admission medications   Medication Sig Start Date End Date Taking? Authorizing Provider  Calcium Citrate (CITRACAL PO) Take 1 tablet by mouth in the morning, at  noon, in the evening, and at bedtime.   Yes [provider]  letrozole (FEMARA) 2.5 MG tablet Take 1 tablet (2.5 mg total) by mouth daily. 04/01/20  Yes Nicholas Lose, MD  LORazepam (ATIVAN) 1 MG tablet Take 0.5 mg by mouth at bedtime.   Yes [provider]  omeprazole (PRILOSEC) 20 MG capsule Take 20 mg by mouth daily.   Yes [provider]  ondansetron (ZOFRAN ODT) 4 MG disintegrating tablet Take 1 tablet (4 mg total) by mouth every 8 (eight) hours as needed for nausea or vomiting. 04/01/20  Yes Nicholas Lose, MD  oxyCODONE (OXY IR/ROXICODONE) 5 MG immediate release tablet Take 2.5 mg by mouth every 4 (four) hours as needed for severe pain.   Yes [provider]  PSE-DM-APAP & PSE-DM-CPM-APAP (TYLENOL COLD & FLU DAY/NIGHT) 30-15-500 & 30- 15-2-500MG  TBPK Take 5 mLs by mouth 2 (two) times daily as needed (pain).   Yes [provider]  palbociclib (IBRANCE) 100 MG tablet Take 1 tablet (100 mg total) by mouth daily. Take for 21 days on, 7 days off, repeat every 28 days. Patient not taking: No sig reported 04/02/20   Nicholas Lose, MD    Physical Exam: Vitals:   04/10/20 1600 04/10/20 1630 04/10/20 1700 04/10/20 1730  BP: 125/73 135/77 125/80 125/77  Pulse: 82 97 81 81  Resp: 15 19 14  (!) 21  Temp:    98 F (36.7 C)  TempSrc:      SpO2: 100% 100% 100% 100%    General: Vital signs reviewed.  Very emaciated elderly lady, in no acute distress and cooperative with exam.  Head: Normocephalic and atraumatic. Eyes: EOMI, conjunctivae normal, no scleral icterus.  ENMT: Mucous membranes are dry Neck: Supple, trachea midline, normal ROM, Cardiovascular: RRR, S1 normal, S2 normal, no murmurs, gallops, or rubs. Pulmonary/Chest: Clear to auscultation bilaterally, no wheezes, rales, or rhonchi. Abdominal: Soft, non-tender, non-distended, BS +,  Extremities: No lower extremity edema bilaterally,  pulses symmetric and intact bilaterally. No cyanosis or  clubbing. Neurological: A&O x3, Strength is normal and symmetric bilaterally, cranial nerve II-XII are grossly intact, no focal motor deficit, sensory intact to light touch bilaterally.  Psychiatric: Appears very anxious.  Labs on Admission: I have personally reviewed following labs and imaging studies  CBC: Recent Labs  Lab 04/10/20 1259  WBC 6.4  NEUTROABS 5.5  HGB 19.7*  HCT 60.3*  MCV 92.1  PLT 601   Basic Metabolic Panel: Recent Labs  Lab 04/10/20 1259  NA 142  K 3.8  CL 102  CO2 21*  GLUCOSE 163*  BUN 65*  CREATININE 1.01*  CALCIUM 10.0   GFR: Estimated Creatinine Clearance: 34.4 mL/min (A) (by C-G formula based on SCr of 1.01 mg/dL (H)). Liver Function Tests: Recent Labs  Lab 04/10/20 1259  AST 55*  ALT 82*  ALKPHOS 613*  BILITOT 2.0*  PROT 7.0  ALBUMIN 4.0   No results for input(s): LIPASE, AMYLASE in the last 168 hours. No results for input(s): AMMONIA in the last 168 hours. Coagulation Profile: No results for input(s): INR, PROTIME in the last 168 hours. Cardiac Enzymes: No results for input(s): CKTOTAL, CKMB, CKMBINDEX, TROPONINI in the last 168 hours. BNP (last 3 results) No results for input(s): PROBNP in the last 8760 hours. HbA1C: No results for input(s): HGBA1C in the last 72 hours. CBG: No results for input(s): GLUCAP in the last 168 hours. Lipid Profile: No results for input(s): CHOL, HDL, LDLCALC, TRIG, CHOLHDL, LDLDIRECT in the last 72 hours. Thyroid Function Tests: No results for input(s): TSH, T4TOTAL, FREET4, T3FREE, THYROIDAB in the last 72 hours. Anemia Panel: No results for input(s): VITAMINB12, FOLATE, FERRITIN, TIBC, IRON, RETICCTPCT in the last 72 hours. Urine analysis:    Component Value Date/Time   COLORURINE DARK YELLOW 11/09/2019 1407   APPEARANCEUR CLOUDY (A) 11/09/2019 1407   LABSPEC 1.027 11/09/2019 1407   PHURINE < OR = 5.0 11/09/2019 1407   GLUCOSEU NEGATIVE 11/09/2019 1407   HGBUR TRACE (A) 11/09/2019 1407    BILIRUBINUR small (A) 08/16/2019 0939   BILIRUBINUR small 02/22/2019 1011   KETONESUR TRACE (A) 11/09/2019 1407   PROTEINUR 1+ (A) 11/09/2019 1407   UROBILINOGEN 0.2 08/16/2019 0939   NITRITE NEGATIVE 11/09/2019 1407   LEUKOCYTESUR 2+ (A) 11/09/2019 1407    Radiological Exams on Admission: CT Head Wo Contrast  Result Date: 04/10/2020 CLINICAL DATA:  Metastatic disease evaluation. EXAM: CT HEAD WITHOUT CONTRAST TECHNIQUE: Contiguous axial images were obtained from the base of the skull through the vertex without intravenous contrast. COMPARISON:  None. FINDINGS: Brain: No evidence of acute infarction, hemorrhage, hydrocephalus, extra-axial collection or mass lesion/mass effect. Mild scattered white matter hypoattenuation, which is nonspecific but most likely related to chronic microvascular ischemic disease. No midline shift. Vascular: Calcific atherosclerosis. Skull: Left occipital lytic calvarial lesion (series 3, image 12). Left frontal calvarial lytic lesion (series 3, image 16). Sinuses/Orbits: Visualized sinuses are clear.  Unremarkable orbits. Other: No mastoid effusions. IMPRESSION: 1. No evidence of acute intracranial abnormality. No obvious evidence of intracranial metastatic disease, although evaluation is limited on this noncontrast head CT. MRI with and without contrast could provide far more sensitive evaluation if clinically indicated. 2. Lytic calvarial lesions involving the left occipital and left frontal bones, concerning for osseous metastatic disease given the clinical history. Outside MRI (from 03/14/2020 in [facility]) describes additional areas of metastatic disease at the craniocervical junction, which are not well evaluated on this study. Electronically Signed   By: Margaretha Sheffield MD   On: 04/10/2020 15:54    EKG: Independently reviewed.  Sinus tachycardia and no other significant abnormality.  Assessment/Plan Active Problems:   Failure to thrive in adult    Failure to thrive with metastatic breast cancer.  Patient refusing most of the treatment but does not want to give up either.  Asking for nutrition through IV as she does not feel like eating, her oncologist told her that if she can maintain her nutrition then they can do some treatment to increase her life up to 2-year. She was also refusing nausea medication, stating that makes her stomach pain worse and she does not have any nausea, she is just spitting as she feels regurgitant mucus in her throat. -Palliative care consult. -Nutritional consult. -Consult oncology for further recommendations.  RUQ pain and transaminitis.  Daughter was not aware of any imaging done  at [place] to rule out liver mets.  Patient does not want another CT scan but agrees to do ultrasound. -Abdominal ultrasound.  AKI.  Most likely secondary to dehydration.  Blood work-up more consistent with severe dehydration.  Patient received 2 L in ED. -Continue with IV hydration. -Continue to monitor  Anxiety. -Continue home dose of as needed Ativan.   DVT prophylaxis: Lovenox Code Status: Full code, discussed with daughter and she will discussed with her sister before making her DNR. Family Communication: Daughter was updated at bedside Disposition Plan: To be determined Consults called: Oncology Admission status: Observation   Lorella Nimrod MD [facility]  If 7PM-7AM, please contact night-coverage [email or web address]  04/10/2020, 6:07 PM   This record has been created using Systems analyst. Errors have been sought and corrected,but may not always be located. Such creation errors do not reflect on the standard of care.

## 2020-04-10 NOTE — ED Notes (Signed)
[place] in room at this time. Called transport and asked to wait to transport patient upstairs

## 2020-04-10 NOTE — ED Provider Notes (Signed)
Patient signed out to me by prior physician.  CT imaging appears unremarkable at this time, no large lesion noted.  Patient continuing with IV hydration.  Receiving IV medication here.  Pending evaluation by her oncologist.  Will be brought in to the medical service for intractable nausea vomiting weakness and fatigue in the setting of metastatic cancer.     Luna Fuse, MD 04/10/20 1626

## 2020-04-11 ENCOUNTER — Other Ambulatory Visit: Payer: Self-pay

## 2020-04-11 DIAGNOSIS — N178 Other acute kidney failure: Secondary | ICD-10-CM | POA: Diagnosis not present

## 2020-04-11 DIAGNOSIS — N179 Acute kidney failure, unspecified: Secondary | ICD-10-CM | POA: Diagnosis present

## 2020-04-11 DIAGNOSIS — E785 Hyperlipidemia, unspecified: Secondary | ICD-10-CM | POA: Diagnosis present

## 2020-04-11 DIAGNOSIS — R112 Nausea with vomiting, unspecified: Secondary | ICD-10-CM | POA: Diagnosis present

## 2020-04-11 DIAGNOSIS — E441 Mild protein-calorie malnutrition: Secondary | ICD-10-CM

## 2020-04-11 DIAGNOSIS — C7931 Secondary malignant neoplasm of brain: Secondary | ICD-10-CM | POA: Diagnosis present

## 2020-04-11 DIAGNOSIS — Z9011 Acquired absence of right breast and nipple: Secondary | ICD-10-CM | POA: Diagnosis not present

## 2020-04-11 DIAGNOSIS — Z681 Body mass index (BMI) 19 or less, adult: Secondary | ICD-10-CM | POA: Diagnosis not present

## 2020-04-11 DIAGNOSIS — Z7189 Other specified counseling: Secondary | ICD-10-CM | POA: Diagnosis not present

## 2020-04-11 DIAGNOSIS — F419 Anxiety disorder, unspecified: Secondary | ICD-10-CM

## 2020-04-11 DIAGNOSIS — Z431 Encounter for attention to gastrostomy: Secondary | ICD-10-CM | POA: Diagnosis not present

## 2020-04-11 DIAGNOSIS — E876 Hypokalemia: Secondary | ICD-10-CM | POA: Diagnosis present

## 2020-04-11 DIAGNOSIS — C50919 Malignant neoplasm of unspecified site of unspecified female breast: Secondary | ICD-10-CM

## 2020-04-11 DIAGNOSIS — R748 Abnormal levels of other serum enzymes: Secondary | ICD-10-CM | POA: Diagnosis not present

## 2020-04-11 DIAGNOSIS — E860 Dehydration: Secondary | ICD-10-CM

## 2020-04-11 DIAGNOSIS — C7951 Secondary malignant neoplasm of bone: Secondary | ICD-10-CM

## 2020-04-11 DIAGNOSIS — R1011 Right upper quadrant pain: Secondary | ICD-10-CM

## 2020-04-11 DIAGNOSIS — Z820 Family history of epilepsy and other diseases of the nervous system: Secondary | ICD-10-CM | POA: Diagnosis not present

## 2020-04-11 DIAGNOSIS — R627 Adult failure to thrive: Secondary | ICD-10-CM | POA: Diagnosis present

## 2020-04-11 DIAGNOSIS — M25559 Pain in unspecified hip: Secondary | ICD-10-CM | POA: Diagnosis present

## 2020-04-11 DIAGNOSIS — K219 Gastro-esophageal reflux disease without esophagitis: Secondary | ICD-10-CM | POA: Diagnosis present

## 2020-04-11 DIAGNOSIS — Z853 Personal history of malignant neoplasm of breast: Secondary | ICD-10-CM | POA: Diagnosis not present

## 2020-04-11 DIAGNOSIS — E870 Hyperosmolality and hypernatremia: Secondary | ICD-10-CM | POA: Diagnosis present

## 2020-04-11 DIAGNOSIS — R7401 Elevation of levels of liver transaminase levels: Secondary | ICD-10-CM

## 2020-04-11 DIAGNOSIS — R1310 Dysphagia, unspecified: Secondary | ICD-10-CM | POA: Diagnosis not present

## 2020-04-11 DIAGNOSIS — E43 Unspecified severe protein-calorie malnutrition: Secondary | ICD-10-CM | POA: Diagnosis present

## 2020-04-11 DIAGNOSIS — Z8249 Family history of ischemic heart disease and other diseases of the circulatory system: Secondary | ICD-10-CM | POA: Diagnosis not present

## 2020-04-11 DIAGNOSIS — Z20822 Contact with and (suspected) exposure to covid-19: Secondary | ICD-10-CM | POA: Diagnosis present

## 2020-04-11 DIAGNOSIS — Z888 Allergy status to other drugs, medicaments and biological substances status: Secondary | ICD-10-CM | POA: Diagnosis not present

## 2020-04-11 DIAGNOSIS — F411 Generalized anxiety disorder: Secondary | ICD-10-CM | POA: Diagnosis present

## 2020-04-11 DIAGNOSIS — R7989 Other specified abnormal findings of blood chemistry: Secondary | ICD-10-CM | POA: Diagnosis present

## 2020-04-11 DIAGNOSIS — Z515 Encounter for palliative care: Secondary | ICD-10-CM | POA: Diagnosis not present

## 2020-04-11 LAB — COMPREHENSIVE METABOLIC PANEL
ALT: 60 U/L — ABNORMAL HIGH (ref 0–44)
AST: 44 U/L — ABNORMAL HIGH (ref 15–41)
Albumin: 2.7 g/dL — ABNORMAL LOW (ref 3.5–5.0)
Alkaline Phosphatase: 459 U/L — ABNORMAL HIGH (ref 38–126)
Anion gap: 10 (ref 5–15)
BUN: 36 mg/dL — ABNORMAL HIGH (ref 8–23)
CO2: 28 mmol/L (ref 22–32)
Calcium: 9.1 mg/dL (ref 8.9–10.3)
Chloride: 107 mmol/L (ref 98–111)
Creatinine, Ser: 0.62 mg/dL (ref 0.44–1.00)
GFR, Estimated: >60 mL/min (ref >60)
Glucose, Bld: 150 mg/dL — ABNORMAL HIGH (ref 70–99)
Potassium: 3.8 mmol/L (ref 3.5–5.1)
Sodium: 145 mmol/L (ref 135–145)
Total Bilirubin: 1.4 mg/dL — ABNORMAL HIGH (ref 0.3–1.2)
Total Protein: 5.1 g/dL — ABNORMAL LOW (ref 6.5–8.1)

## 2020-04-11 LAB — CBC
HCT: 46.3 % — ABNORMAL HIGH (ref 36.0–46.0)
Hemoglobin: 15.4 g/dL — ABNORMAL HIGH (ref 12.0–15.0)
MCH: 30.1 pg (ref 26.0–34.0)
MCHC: 33.3 g/dL (ref 30.0–36.0)
MCV: 90.6 fL (ref 80.0–100.0)
Platelets: 230 10*3/uL (ref 150–400)
RBC: 5.11 MIL/uL (ref 3.87–5.11)
RDW: 16.2 % — ABNORMAL HIGH (ref 11.5–15.5)
WBC: 4.8 10*3/uL (ref 4.0–10.5)
nRBC: 0 % (ref 0.0–0.2)

## 2020-04-11 MED ORDER — OXYCODONE HCL 5 MG PO TABS
2.5000 mg | ORAL_TABLET | ORAL | Status: DC | PRN
Start: 2020-04-11 — End: 2020-04-15

## 2020-04-11 MED ORDER — ADULT MULTIVITAMIN W/MINERALS CH
1.0000 | ORAL_TABLET | Freq: Every day | ORAL | Status: DC
  Filled 2020-04-11 (×3): qty 1

## 2020-04-11 NOTE — Progress Notes (Signed)
PROGRESS NOTE    [place]  SNK:[id number] DOB: 1940/06/09 DOA: 04/10/2020 PCP: Hali Marry, MD     Brief Narrative:  Jodi Kaufman is a 79 year old female with past medical history significant for breast cancer, newly diagnosed metastatic breast cancer, generalized anxiety disorder, hyperlipidemia, GERD, vitamin D deficiency who presented to the hospital with complaints of nausea, vomiting and poor oral intake.  Patient was recently admitted to [facility] in [place] and was discharged home several weeks ago.  At that time, patient had complained of back and hip pain and was found to have metastatic breast cancer with multiple bony lesions.  She received radiation well inpatient.  She has since followed up with Dr. Lindi Adie from oncology and was started on Ibrance with letrozole.   New events last 24 hours / Subjective: She states that she cannot eat anything because of the phlegm in her throat.  She continues to spit out saliva, rinses her mouth and spits it out during examination.  She also complains of dry mouth.  She is insisting on getting IV nutrition and all of her medications through the IV only and nothing by mouth.  She remains very anxious, tearful, somewhat irate throughout examination.  Had a prolonged discussion with daughter over the phone.  I discussed that there isn't a physical issue preventing her from eating and swallowing food.  Without nutrition, she may not tolerate any oncological treatments.  I am not sure that feeding tube and/or TPN will be best course of treatment with her underlying metastatic disease.    Assessment & Plan:   Active Problems:   Failure to thrive in adult   Failure to thrive with metastatic breast cancer -Consulted oncology, palliative medicine today -Dietitian consult -SLP eval to rule out dysphagia preventing her from PO intake  -IVF   AKI -Resolved, creatinine back to baseline  Right upper quadrant abdominal pain with  elevated liver enzyme -Right upper quadrant ultrasound gallbladder sludge without evidence of cholecystitis  Anxiety -Ativan PRN    DVT prophylaxis:  enoxaparin (LOVENOX) injection 40 mg Start: 04/10/20 2200 Code Status: Full code Family Communication: None at bedside, discussed with daughter over the phone Disposition Plan:  Status is: Observation  The patient will require care spanning > 2 midnights and should be moved to inpatient because: IV treatments appropriate due to intensity of illness or inability to take PO  Dispo: The patient is from: Home              Anticipated d/c is to: TBD              Anticipated d/c date is: 3 days              Patient currently is not medically stable to d/c.      Consultants:   Oncology  Palliative care medicine   Procedures:   None   Antimicrobials:  Anti-infectives (From admission, onward)   None        Objective: Vitals:   04/11/20 0347 04/11/20 0447 04/11/20 0747 04/11/20 1123  BP: 105/67 (!) 138/94 139/75   Pulse: 74 77 73   Resp: 16 18 20    Temp: (!) 97.5 F (36.4 C) 98.7 F (37.1 C) (!) 97.5 F (36.4 C)   TempSrc: Oral Oral Oral   SpO2: 99% 96% 100%   Weight:      Height:    5\' 6"  (1.676 m)    Intake/Output Summary (Last 24 hours) at 04/11/2020 1249 Last data filed  at 04/11/2020 1058 Gross per 24 hour  Intake 2500 ml  Output -  Net 2500 ml   Filed Weights   04/10/20 1946  Weight: 47.5 kg    Examination:  General exam: Appears calm and comfortable  Respiratory system: Clear to auscultation. Respiratory effort normal. No respiratory distress. No conversational dyspnea.  Cardiovascular system: S1 & S2 heard, RRR. No murmurs. No pedal edema. Gastrointestinal system: Abdomen is nondistended, soft and nontender. Normal bowel sounds heard. Central nervous system: Alert and oriented. No focal neurological deficits. Speech clear.  Extremities: Symmetric in appearance  Skin: No rashes, lesions or ulcers  on exposed skin  Psychiatry: Judgement and insight appear poor, tearful throughout our exam   Data Reviewed: I have personally reviewed following labs and imaging studies  CBC: Recent Labs  Lab 04/10/20 1259 04/11/20 0549  WBC 6.4 4.8  NEUTROABS 5.5  --   HGB 19.7* 15.4*  HCT 60.3* 46.3*  MCV 92.1 90.6  PLT 279 096   Basic Metabolic Panel: Recent Labs  Lab 04/10/20 1259 04/11/20 0549  NA 142 145  K 3.8 3.8  CL 102 107  CO2 21* 28  GLUCOSE 163* 150*  BUN 65* 36*  CREATININE 1.01* 0.62  CALCIUM 10.0 9.1   GFR: Estimated Creatinine Clearance: 43.5 mL/min (by C-G formula based on SCr of 0.62 mg/dL). Liver Function Tests: Recent Labs  Lab 04/10/20 1259 04/11/20 0549  AST 55* 44*  ALT 82* 60*  ALKPHOS 613* 459*  BILITOT 2.0* 1.4*  PROT 7.0 5.1*  ALBUMIN 4.0 2.7*   No results for input(s): LIPASE, AMYLASE in the last 168 hours. No results for input(s): AMMONIA in the last 168 hours. Coagulation Profile: No results for input(s): INR, PROTIME in the last 168 hours. Cardiac Enzymes: No results for input(s): CKTOTAL, CKMB, CKMBINDEX, TROPONINI in the last 168 hours. BNP (last 3 results) No results for input(s): PROBNP in the last 8760 hours. HbA1C: No results for input(s): HGBA1C in the last 72 hours. CBG: No results for input(s): GLUCAP in the last 168 hours. Lipid Profile: No results for input(s): CHOL, HDL, LDLCALC, TRIG, CHOLHDL, LDLDIRECT in the last 72 hours. Thyroid Function Tests: No results for input(s): TSH, T4TOTAL, FREET4, T3FREE, THYROIDAB in the last 72 hours. Anemia Panel: No results for input(s): VITAMINB12, FOLATE, FERRITIN, TIBC, IRON, RETICCTPCT in the last 72 hours. Sepsis Labs: Recent Labs  Lab 04/10/20 1259 04/10/20 1817 04/10/20 2050  LATICACIDVEN 3.6* 2.2* 1.8    Recent Results (from the past 240 hour(s))  Resp Panel by RT-PCR (Flu A&B, Covid) Nasopharyngeal Swab     Status: None   Collection Time: 04/10/20  2:36 PM   Specimen:  Nasopharyngeal Swab; Nasopharyngeal(NP) swabs in vial transport medium  Result Value Ref Range Status   SARS Coronavirus 2 by RT PCR NEGATIVE NEGATIVE Final    Comment: (NOTE) SARS-CoV-2 target nucleic acids are NOT DETECTED.  The SARS-CoV-2 RNA is generally detectable in upper respiratory specimens during the acute phase of infection. The lowest concentration of SARS-CoV-2 viral copies this assay can detect is 138 copies/mL. A negative result does not preclude SARS-Cov-2 infection and should not be used as the sole basis for treatment or other patient management decisions. A negative result may occur with  improper specimen collection/handling, submission of specimen other than nasopharyngeal swab, presence of viral mutation(s) within the areas targeted by this assay, and inadequate number of viral copies(<138 copies/mL). A negative result must be combined with clinical observations, patient history, and epidemiological information.  The expected result is Negative.  Fact Sheet for Patients:  EntrepreneurPulse.com.au  Fact Sheet for Healthcare Providers:  IncredibleEmployment.be  This test is no t yet approved or cleared by the [place] FDA and  has been authorized for detection and/or diagnosis of SARS-CoV-2 by FDA under an Emergency Use Authorization (EUA). This EUA will remain  in effect (meaning this test can be used) for the duration of the COVID-19 declaration under Section 564(b)(1) of the Act, 21 U.S.C.section 360bbb-3(b)(1), unless the authorization is terminated  or revoked sooner.       Influenza A by PCR NEGATIVE NEGATIVE Final   Influenza B by PCR NEGATIVE NEGATIVE Final    Comment: (NOTE) The Xpert Xpress SARS-CoV-2/FLU/RSV plus assay is intended as an aid in the diagnosis of influenza from Nasopharyngeal swab specimens and should not be used as a sole basis for treatment. Nasal washings and aspirates are unacceptable for  Xpert Xpress SARS-CoV-2/FLU/RSV testing.  Fact Sheet for Patients: EntrepreneurPulse.com.au  Fact Sheet for Healthcare Providers: IncredibleEmployment.be  This test is not yet approved or cleared by the [place] FDA and has been authorized for detection and/or diagnosis of SARS-CoV-2 by FDA under an Emergency Use Authorization (EUA). This EUA will remain in effect (meaning this test can be used) for the duration of the COVID-19 declaration under Section 564(b)(1) of the Act, 21 U.S.C. section 360bbb-3(b)(1), unless the authorization is terminated or revoked.  Performed at [facility], [place] [address], [place], [place] [address]       Radiology Studies: CT Head Wo Contrast  Result Date: 04/10/2020 CLINICAL DATA:  Metastatic disease evaluation. EXAM: CT HEAD WITHOUT CONTRAST TECHNIQUE: Contiguous axial images were obtained from the base of the skull through the vertex without intravenous contrast. COMPARISON:  None. FINDINGS: Brain: No evidence of acute infarction, hemorrhage, hydrocephalus, extra-axial collection or mass lesion/mass effect. Mild scattered white matter hypoattenuation, which is nonspecific but most likely related to chronic microvascular ischemic disease. No midline shift. Vascular: Calcific atherosclerosis. Skull: Left occipital lytic calvarial lesion (series 3, image 12). Left frontal calvarial lytic lesion (series 3, image 16). Sinuses/Orbits: Visualized sinuses are clear.  Unremarkable orbits. Other: No mastoid effusions. IMPRESSION: 1. No evidence of acute intracranial abnormality. No obvious evidence of intracranial metastatic disease, although evaluation is limited on this noncontrast head CT. MRI with and without contrast could provide far more sensitive evaluation if clinically indicated. 2. Lytic calvarial lesions involving the left occipital and left frontal bones, concerning for osseous metastatic disease  given the clinical history. Outside MRI (from 03/14/2020 in Care everywhere) describes additional areas of metastatic disease at the craniocervical junction, which are not well evaluated on this study. Electronically Signed   By: Margaretha Sheffield MD   On: 04/10/2020 15:54   US Abdomen Complete  Result Date: 04/10/2020 CLINICAL DATA:  Abnormal liver enzymes.  Breast carcinoma. EXAM: ABDOMEN ULTRASOUND COMPLETE COMPARISON:  None. FINDINGS: Gallbladder: Sludge in the gallbladder. A negative sonographic Percell Miller sign was reported by the sonographer. No wall thickening or pericholecystic fluid. Common bile duct: Diameter: 2 mm Liver: No focal lesion identified. Within normal limits in parenchymal echogenicity. Portal vein is patent on color Doppler imaging with normal direction of blood flow towards the liver. IVC: No abnormality visualized. Pancreas: Visualized portion unremarkable. Spleen: Size and appearance within normal limits. Right Kidney: Length: 10.7 cm.  Mild hydronephrosis. Left Kidney: Length: 9.8 cm.  There are two 1.0 cm renal cyst. Abdominal aorta: No aneurysm visualized. There is atherosclerotic plaque. Other  findings: None. IMPRESSION: 1. Mild right hydronephrosis. 2. Gallbladder sludge without other evidence of cholecystitis. 3.  Aortic atherosclerosis (ICD10-I70.0). Electronically Signed   By: Ulyses Jarred M.D.   On: 04/10/2020 19:17      Scheduled Meds: . enoxaparin (LOVENOX) injection  40 mg Subcutaneous Q24H  . feeding supplement  237 mL Oral BID BM  . letrozole  2.5 mg Oral Daily  . pantoprazole  40 mg Oral Daily  . sodium chloride flush  3 mL Intravenous Q12H   Continuous Infusions: . dextrose 5% lactated ringers 75 mL/hr at 04/11/20 0436     LOS: 0 days      Time spent: 45 minutes   Dessa Phi, DO [facility] 04/11/2020, 12:49 PM   Available via Epic secure chat 7am-7pm After these hours, please refer to coverage provider listed on [email or web address]

## 2020-04-11 NOTE — Progress Notes (Addendum)
HEMATOLOGY-ONCOLOGY PROGRESS NOTE  SUBJECTIVE: Ms. Kiger recently establish care with our practice for metastatic breast cancer.  Initial visit was on 04/01/2020.  Recent CT chest/abdomen/pelvis performed on 03/11/2020 for abdominal pain showed extensive multifocal lytic osseous lesions involving the majority of the visualized vertebral bodies, several ribs, and pelvis and many lesions had associated soft tissue components.  She underwent a biopsy of the right second rib on 03/14/2020.  Pathology showed metastatic carcinoma consistent with breast origin, ER +80%, PR negative, HER-2 negative (0).  She also has a history of uterine cancer.  She completed palliative radiation to the spine.  We recommended treatment with Ibrance 100 mg daily (dose reduced secondary to poor performance status) and letrozole 2.5 mg daily.  Presented to the hospital with nausea, vomiting, poor p.o. intake.  Reports sensation of fluid and food getting stuck in her throat.  Requesting nutrition through an IV.   The patient was seen in her hospital room.  No family at the bedside.  She reports that she can take in minimal p.o. fluids and occasionally boost but no other nutrition. States that she is unable to take it anything due to a very dry mouth and also the sensation of food/pills getting stuck in her chest.  She tells me that she took [place] and letrozole on one occasion and it made her stomach hurt.  Therefore, she stopped taking this.  She currently denies pain  Oncology History  Metastatic breast cancer ([place])  1995 Initial Biopsy   Right breast cancer stage IIb ER positive IDC s/p mastectomy and 2 months of tamoxifen, lost to follow-up (treatment at [facility])   03/12/2020 - 03/26/2020 Radiation Therapy   Palliative radiation to the brain and spine for brain metastases   03/14/2020 Initial Diagnosis   Scans on 03/14/2020: Multiple bone metastases and brain metastases, biopsy of the rib: Breast cancer ER 80% PR  negative HER-2 negative.  Moved to [facility] December 2021 to be closer to her family      REVIEW OF SYSTEMS:   Constitutional: Denies fevers, chills, positive for weight loss Eyes: Denies blurriness of vision Ears, nose, mouth, throat, and face: Denies mucositis but reports a very dry mouth Respiratory: Denies cough, dyspnea or wheezes Cardiovascular: Denies palpitation, chest discomfort Gastrointestinal: Reports gagging and nausea when she tries to take in food or pills by mouth Skin: Denies abnormal skin rashes Lymphatics: Denies new lymphadenopathy or easy bruising Neurological:Denies numbness, tingling or new weaknesses Behavioral/Psych: Mood is stable, no new changes  Extremities: No lower extremity edema All other systems were reviewed with the patient and are negative.  I have reviewed the past medical history, past surgical history, social history and family history with the patient and they are unchanged from previous note.   PHYSICAL EXAMINATION: ECOG PERFORMANCE STATUS: 3 - Symptomatic, >50% confined to bed  Vitals:   04/11/20 0447 04/11/20 0747  BP: (!) 138/94 139/75  Pulse: 77 73  Resp: 18 20  Temp: 98.7 F (37.1 C) (!) 97.5 F (36.4 C)  SpO2: 96% 100%   Filed Weights   04/10/20 1946  Weight: 47.5 kg    Intake/Output from previous day: 12/15 0701 - 12/16 0700 In: 2000 [IV Piggyback:2000] Out: -   GENERAL:alert, no distress and comfortable SKIN: skin color, texture, turgor are normal, no rashes or significant lesions EYES: normal, Conjunctiva are pink and non-injected, sclera clear OROPHARYNX:no exudate, no erythema and lips, buccal mucosa, and tongue normal  NECK: supple, thyroid normal size, non-tender, without nodularity  LYMPH:  no palpable lymphadenopathy in the cervical, axillary or inguinal LUNGS: clear to auscultation and percussion with normal breathing effort HEART: regular rate & rhythm and no murmurs and no lower extremity  edema ABDOMEN:abdomen soft, non-tender and normal bowel sounds Musculoskeletal:no cyanosis of digits and no clubbing  NEURO: alert & oriented x 3 with fluent speech, no focal motor/sensory deficits  LABORATORY DATA:  I have reviewed the data as listed CMP Latest Ref Rng & Units 04/11/2020 04/10/2020 04/01/2020  Glucose 70 - 99 mg/dL 150(H) 163(H) 127(H)  BUN 8 - 23 mg/dL 36(H) 65(H) 27(H)  Creatinine 0.44 - 1.00 mg/dL 0.62 1.01(H) 0.77  Sodium 135 - 145 mmol/L 145 142 139  Potassium 3.5 - 5.1 mmol/L 3.8 3.8 4.4  Chloride 98 - 111 mmol/L 107 102 103  CO2 22 - 32 mmol/L 28 21(L) 20(L)  Calcium 8.9 - 10.3 mg/dL 9.1 10.0 9.9  Total Protein 6.5 - 8.1 g/dL 5.1(L) 7.0 6.9  Total Bilirubin 0.3 - 1.2 mg/dL 1.4(H) 2.0(H) 2.8(H)  Alkaline Phos 38 - 126 U/L 459(H) 613(H) 592(H)  AST 15 - 41 U/L 44(H) 55(H) 44(H)  ALT 0 - 44 U/L 60(H) 82(H) 74(H)    Lab Results  Component Value Date   WBC 4.8 04/11/2020   HGB 15.4 (H) 04/11/2020   HCT 46.3 (H) 04/11/2020   MCV 90.6 04/11/2020   PLT 230 04/11/2020   NEUTROABS 5.5 04/10/2020    CT Head Wo Contrast  Result Date: 04/10/2020 CLINICAL DATA:  Metastatic disease evaluation. EXAM: CT HEAD WITHOUT CONTRAST TECHNIQUE: Contiguous axial images were obtained from the base of the skull through the vertex without intravenous contrast. COMPARISON:  None. FINDINGS: Brain: No evidence of acute infarction, hemorrhage, hydrocephalus, extra-axial collection or mass lesion/mass effect. Mild scattered white matter hypoattenuation, which is nonspecific but most likely related to chronic microvascular ischemic disease. No midline shift. Vascular: Calcific atherosclerosis. Skull: Left occipital lytic calvarial lesion (series 3, image 12). Left frontal calvarial lytic lesion (series 3, image 16). Sinuses/Orbits: Visualized sinuses are clear.  Unremarkable orbits. Other: No mastoid effusions. IMPRESSION: 1. No evidence of acute intracranial abnormality. No obvious evidence  of intracranial metastatic disease, although evaluation is limited on this noncontrast head CT. MRI with and without contrast could provide far more sensitive evaluation if clinically indicated. 2. Lytic calvarial lesions involving the left occipital and left frontal bones, concerning for osseous metastatic disease given the clinical history. Outside MRI (from 03/14/2020 in Care everywhere) describes additional areas of metastatic disease at the craniocervical junction, which are not well evaluated on this study. Electronically Signed   By: Margaretha Sheffield MD   On: 04/10/2020 15:54   US Abdomen Complete  Result Date: 04/10/2020 CLINICAL DATA:  Abnormal liver enzymes.  Breast carcinoma. EXAM: ABDOMEN ULTRASOUND COMPLETE COMPARISON:  None. FINDINGS: Gallbladder: Sludge in the gallbladder. A negative sonographic Percell Miller sign was reported by the sonographer. No wall thickening or pericholecystic fluid. Common bile duct: Diameter: 2 mm Liver: No focal lesion identified. Within normal limits in parenchymal echogenicity. Portal vein is patent on color Doppler imaging with normal direction of blood flow towards the liver. IVC: No abnormality visualized. Pancreas: Visualized portion unremarkable. Spleen: Size and appearance within normal limits. Right Kidney: Length: 10.7 cm.  Mild hydronephrosis. Left Kidney: Length: 9.8 cm.  There are two 1.0 cm renal cyst. Abdominal aorta: No aneurysm visualized. There is atherosclerotic plaque. Other findings: None. IMPRESSION: 1. Mild right hydronephrosis. 2. Gallbladder sludge without other evidence of cholecystitis. 3.  Aortic  atherosclerosis (ICD10-I70.0). Electronically Signed   By: Ulyses Jarred M.D.   On: 04/10/2020 19:17    ASSESSMENT AND PLAN: 1.  Metastatic breast cancer 2.  Failure to thrive/protein calorie malnutrition 3.  AKI secondary to dehydration 4.  Right upper quadrant pain with transaminitis 5.  Anxiety  -Discussed treatment of her breast cancer with  her.  She expressed that she would like to continue on some form of treatment.  Performance status is too poor for IV chemotherapy.  She would be willing to try taking Ibrance and letrozole in the future. -Discussed nutritional status with the patient.  We do not recommend TPN.  I had some discussion with the patient regarding artificial feeding including PEG tube placement.  She states that she has discussed this with her family a little bit and would consider this.  She expresses today that she really wants to attempt to continue to prolong her life. -I discussed palliative care and hospice with the patient.  She clearly told me that she is not interested in these options at this point in time.  She wants to continue to treat her metastatic breast cancer and is considering artificial nutrition. -Agree with palliative care consult to help define goals of care.   LOS: 0 days   Mikey Bussing, DNP, AGPCNP-BC, AOCNP 04/11/20  Attending Note  I personally saw the patient, reviewed the chart and examined the patient. The plan of care was discussed with the patient and her daughter I agree with the assessment and plan as documented above. Thank you very much for the consultation. -Failure to thrive due to difficulty with swallowing: I recommended that the patient undergo PEG tube placement. I counseled her extensively that she can still eat with the PEG tube if she is able to. -Metastatic breast cancer: I discussed with her about her goals of care. She is not ready to accept hospice. She says she enjoys life and wants to live further. I discussed with her that if her performance status worsens where she is unable to participate in ADLs, we may have to stop treatment and consider hospice care. -For the time being she wants to try the PEG tube and see if her energy levels improve. Acute kidney injury due to dehydration from lack of oral intake -Once discharged she can receive letrozole with Ibrance that  can be crushed and put through the PEG tube.

## 2020-04-11 NOTE — Progress Notes (Signed)
Initial Nutrition Assessment  DOCUMENTATION CODES:   Severe malnutrition in context of chronic illness,Underweight  INTERVENTION:   Monitor magnesium, potassium, and phosphorus daily for at least 3 days, MD to replete as needed, as pt is at risk for refeeding syndrome.  -Boost HP to be brought in by family -Encouraged PO intakes -Multivitamin with minerals daily -D/c Ensure per pt request  -If PO intakes do not improve, may need to place small bore feeding tube for short term nutrition  NUTRITION DIAGNOSIS:   Severe Malnutrition related to chronic illness,cancer and cancer related treatments as evidenced by percent weight loss,energy intake < or equal to 75% for > or equal to 1 month,severe fat depletion,severe muscle depletion.  GOAL:   Patient will meet greater than or equal to 90% of their needs  MONITOR:   PO intake,Supplement acceptance,Labs,Weight trends,I & O's  REASON FOR ASSESSMENT:   Consult Poor PO,Assessment of nutrition requirement/status  ASSESSMENT:   79 year old female with past medical history significant for breast cancer, newly diagnosed metastatic breast cancer, generalized anxiety disorder, hyperlipidemia, GERD, vitamin D deficiency who presented to the hospital with complaints of nausea, vomiting and poor oral intake.  Patient was recently admitted to [facility] in [place] and was discharged home several weeks ago.  At that time, patient had complained of back and hip pain and was found to have metastatic breast cancer with multiple bony lesions.  She received radiation well inpatient.  She has since followed up with Dr. Lindi Adie from oncology and was started on Ibrance with letrozole.  Patient in room with no family at bedside.  Pt very anxious during encounter and very upset about the cafeteria not providing [place]. Pt eagerly anticipating return of her daughter who was supposed to bring more Dasani water and Boost drinks.  Pt states she  only tolerates certain waters such as Dasani or [employer]. Requests that all liquids be ice cold. C/o her taste not being right since having radiation for her metastatic breast cancer. States that if she could "clean up" her mouth she would eat something.  Worked with pt and provided active listening as pt seems very overwhelmed.  Pt does like Boost HP drinks but only likes vanilla flavor. Daughter can bring these to hospital.  Pt requesting beef broth and vanilla yogurt later once she can eat. Currently cafeteria out of stock of yogurt. Will keep checking availability this admission. Spoke with RN and made her aware of pt's requests.  If PO intake does not improve, pt would benefit from tube feeding to meet her needs as it sounds like she has not been eating well for weeks now. Pt with severe malnutrition.  Per weight records, pt has lost 28 lbs since 1/20 (21% wt loss x 11 months, significant for time frame).  Labs reviewed. Medications: D5 infusion  NUTRITION - FOCUSED PHYSICAL EXAM:  Flowsheet Row Most Recent Value  Orbital Region Severe depletion  Upper Arm Region Severe depletion  Thoracic and Lumbar Region Unable to assess  Buccal Region Severe depletion  Temple Region Severe depletion  Clavicle Bone Region Severe depletion  Clavicle and Acromion Bone Region Severe depletion  Scapular Bone Region Severe depletion  Dorsal Hand Severe depletion  Patellar Region Severe depletion  Anterior Thigh Region Severe depletion  Posterior Calf Region Severe depletion  Edema (RD Assessment) None  Hair Reviewed  Eyes Reviewed  Mouth Reviewed  [missing some teeth]  Skin Reviewed       Diet Order:  Diet Order            DIET SOFT Room service appropriate? Yes; Fluid consistency: Thin  Diet effective now                 EDUCATION NEEDS:   Not appropriate for education at this time  Skin:  Skin Assessment: Reviewed RN Assessment  Last BM:  12/14  Height:   Ht  Readings from Last 1 Encounters:  04/11/20 5\' 6"  (1.676 m)    Weight:   Wt Readings from Last 1 Encounters:  04/10/20 47.5 kg   BMI:  Body mass index is 16.9 kg/m.  Estimated Nutritional Needs:   Kcal:  0131-4388  Protein:  80-95g  Fluid:  1.9L/day   Clayton Bibles, MS, RD, LDN Inpatient Clinical Dietitian Contact information available via Amion

## 2020-04-12 ENCOUNTER — Inpatient Hospital Stay (HOSPITAL_COMMUNITY): Payer: Medicare Other

## 2020-04-12 DIAGNOSIS — Z7189 Other specified counseling: Secondary | ICD-10-CM

## 2020-04-12 DIAGNOSIS — Z515 Encounter for palliative care: Secondary | ICD-10-CM

## 2020-04-12 DIAGNOSIS — R1310 Dysphagia, unspecified: Secondary | ICD-10-CM

## 2020-04-12 LAB — COMPREHENSIVE METABOLIC PANEL
ALT: 97 U/L — ABNORMAL HIGH (ref 0–44)
AST: 89 U/L — ABNORMAL HIGH (ref 15–41)
Albumin: 2.6 g/dL — ABNORMAL LOW (ref 3.5–5.0)
Alkaline Phosphatase: 476 U/L — ABNORMAL HIGH (ref 38–126)
Anion gap: 10 (ref 5–15)
BUN: 22 mg/dL (ref 8–23)
CO2: 28 mmol/L (ref 22–32)
Calcium: 9.2 mg/dL (ref 8.9–10.3)
Chloride: 109 mmol/L (ref 98–111)
Creatinine, Ser: 0.58 mg/dL (ref 0.44–1.00)
GFR, Estimated: >60 mL/min (ref >60)
Glucose, Bld: 113 mg/dL — ABNORMAL HIGH (ref 70–99)
Potassium: 3.2 mmol/L — ABNORMAL LOW (ref 3.5–5.1)
Sodium: 147 mmol/L — ABNORMAL HIGH (ref 135–145)
Total Bilirubin: 1.3 mg/dL — ABNORMAL HIGH (ref 0.3–1.2)
Total Protein: 4.7 g/dL — ABNORMAL LOW (ref 6.5–8.1)

## 2020-04-12 MED ORDER — KCL IN DEXTROSE-NACL 40-5-0.45 MEQ/L-%-% IV SOLN
INTRAVENOUS | Status: AC
  Filled 2020-04-12 (×2): qty 1000

## 2020-04-12 MED ORDER — LORAZEPAM 2 MG/ML IJ SOLN
1.0000 mg | Freq: Once | INTRAMUSCULAR | Status: AC | PRN
  Administered 2020-04-12: 1 mg via INTRAVENOUS
  Filled 2020-04-12: qty 1

## 2020-04-12 NOTE — Plan of Care (Signed)
  Problem: Education: Goal: Knowledge of General Education information will improve Description: Including pain rating scale, medication(s)/side effects and non-pharmacologic comfort measures Outcome: Progressing   Problem: Activity: Goal: Risk for activity intolerance will decrease Outcome: Progressing Note: Patient was able to sit up at the side of the bed and dangle her feet.    Problem: Nutrition: Goal: Adequate nutrition will be maintained Outcome: Progressing Note: Patient's nutrition remains inadequate. Patient was able to tolerate coffee, and small bits of broccoli, fish, and Macaroni and cheese.    Problem: Coping: Goal: Level of anxiety will decrease Outcome: Progressing Note: Patient's anxiety level increased during shift. Patient's anxiety level managed via active listening, Positive reinforcement, and encouragement.

## 2020-04-12 NOTE — Progress Notes (Signed)
PROGRESS NOTE    [place]  CWC:[id number] DOB: [birth year] DOA: 04/10/2020 PCP: Hali Marry, MD     Brief Narrative:  Jodi Kaufman is a 79 year old female with past medical history significant for breast cancer, newly diagnosed metastatic breast cancer, generalized anxiety disorder, hyperlipidemia, GERD, vitamin D deficiency who presented to the hospital with complaints of nausea, vomiting and poor oral intake.  Patient was recently admitted to [facility] in [place] and was discharged home several weeks ago.  At that time, patient had complained of back and hip pain and was found to have metastatic breast cancer with multiple bony lesions.  She received radiation well inpatient.  She has since followed up with Dr. Lindi Adie from oncology and was started on Ibrance with letrozole.   New events last 24 hours / Subjective: Patient with multiple non-medical complaints, including not having cold water, legs not being massaged, her nails. She states that she still wants the PEG tube, but not "right now" and wants to wait until her daughter is available.    Assessment & Plan:   Active Problems:   Failure to thrive in adult   Protein-calorie malnutrition, severe   Failure to thrive with metastatic breast cancer -Consulted oncology, after conversation, has recommended PEG tube placement  -SLP eval to rule out dysphagia preventing her from PO intake  -IVF   Hypernatremia -Change to D5-0.45NS today   AKI -Resolved, creatinine back to baseline  Right upper quadrant abdominal pain with elevated liver enzyme -Right upper quadrant ultrasound gallbladder sludge without evidence of cholecystitis -Trend LFT   Anxiety -Ativan PRN   Hypokalemia -Replace, trend   Goals of care -Patient seems intent on doing everything possible to prolong her life, including PEG tube. It is unclear if she has insight into her prognosis and what treatment will entail and how quality of life  will be affected. Palliative care on board    DVT prophylaxis:  enoxaparin (LOVENOX) injection 40 mg Start: 04/10/20 2200 Code Status: Full code Family Communication: None at bedside Disposition Plan:  Status is: Inpatient  Remains inpatient appropriate because:Ongoing diagnostic testing needed not appropriate for outpatient work up, IV treatments appropriate due to intensity of illness or inability to take PO and Inpatient level of care appropriate due to severity of illness   Dispo: The patient is from: Home              Anticipated d/c is to: Home              Anticipated d/c date is: > 3 days              Patient currently is not medically stable to d/c.       Consultants:   Oncology  Palliative care medicine   Procedures:   None   Antimicrobials:  Anti-infectives (From admission, onward)   None       Objective: Vitals:   04/11/20 1123 04/11/20 1318 04/11/20 2053 04/12/20 0553  BP:  (!) 163/87 (!) 143/75 137/73  Pulse:  78 71 70  Resp:  20 16 16   Temp:  98.3 F (36.8 C) 97.6 F (36.4 C) 97.7 F (36.5 C)  TempSrc:  Oral Oral Oral  SpO2:  98% 100% 100%  Weight:      Height: 5\' 6"  (1.676 m)      No intake or output data in the 24 hours ending 04/12/20 1113 Filed Weights   04/10/20 1946  Weight: 47.5 kg    Examination: General  exam: Appears comfortable  Respiratory system: Clear to auscultation. Respiratory effort normal. Cardiovascular system: S1 & S2 heard, RRR. No pedal edema. Gastrointestinal system: Abdomen is nondistended, soft and nontender. Normal bowel sounds heard. Central nervous system: Alert and oriented. Non focal exam. Speech clear  Extremities: Symmetric in appearance bilaterally  Skin: No rashes, lesions or ulcers on exposed skin  Psychiatry: Judgement and insight appear poor. Very anxious today   Data Reviewed: I have personally reviewed following labs and imaging studies  CBC: Recent Labs  Lab 04/10/20 1259 04/11/20 0549   WBC 6.4 4.8  NEUTROABS 5.5  --   HGB 19.7* 15.4*  HCT 60.3* 46.3*  MCV 92.1 90.6  PLT 279 035   Basic Metabolic Panel: Recent Labs  Lab 04/10/20 1259 04/11/20 0549 04/12/20 0621  NA 142 145 147*  K 3.8 3.8 3.2*  CL 102 107 109  CO2 21* 28 28  GLUCOSE 163* 150* 113*  BUN 65* 36* 22  CREATININE 1.01* 0.62 0.58  CALCIUM 10.0 9.1 9.2   GFR: Estimated Creatinine Clearance: 43.5 mL/min (by C-G formula based on SCr of 0.58 mg/dL). Liver Function Tests: Recent Labs  Lab 04/10/20 1259 04/11/20 0549 04/12/20 0621  AST 55* 44* 89*  ALT 82* 60* 97*  ALKPHOS 613* 459* 476*  BILITOT 2.0* 1.4* 1.3*  PROT 7.0 5.1* 4.7*  ALBUMIN 4.0 2.7* 2.6*   No results for input(s): LIPASE, AMYLASE in the last 168 hours. No results for input(s): AMMONIA in the last 168 hours. Coagulation Profile: No results for input(s): INR, PROTIME in the last 168 hours. Cardiac Enzymes: No results for input(s): CKTOTAL, CKMB, CKMBINDEX, TROPONINI in the last 168 hours. BNP (last 3 results) No results for input(s): PROBNP in the last 8760 hours. HbA1C: No results for input(s): HGBA1C in the last 72 hours. CBG: No results for input(s): GLUCAP in the last 168 hours. Lipid Profile: No results for input(s): CHOL, HDL, LDLCALC, TRIG, CHOLHDL, LDLDIRECT in the last 72 hours. Thyroid Function Tests: No results for input(s): TSH, T4TOTAL, FREET4, T3FREE, THYROIDAB in the last 72 hours. Anemia Panel: No results for input(s): VITAMINB12, FOLATE, FERRITIN, TIBC, IRON, RETICCTPCT in the last 72 hours. Sepsis Labs: Recent Labs  Lab 04/10/20 1259 04/10/20 1817 04/10/20 2050  LATICACIDVEN 3.6* 2.2* 1.8    Recent Results (from the past 240 hour(s))  Resp Panel by RT-PCR (Flu A&B, Covid) Nasopharyngeal Swab     Status: None   Collection Time: 04/10/20  2:36 PM   Specimen: Nasopharyngeal Swab; Nasopharyngeal(NP) swabs in vial transport medium  Result Value Ref Range Status   SARS Coronavirus 2 by RT PCR  NEGATIVE NEGATIVE Final    Comment: (NOTE) SARS-CoV-2 target nucleic acids are NOT DETECTED.  The SARS-CoV-2 RNA is generally detectable in upper respiratory specimens during the acute phase of infection. The lowest concentration of SARS-CoV-2 viral copies this assay can detect is 138 copies/mL. A negative result does not preclude SARS-Cov-2 infection and should not be used as the sole basis for treatment or other patient management decisions. A negative result may occur with  improper specimen collection/handling, submission of specimen other than nasopharyngeal swab, presence of viral mutation(s) within the areas targeted by this assay, and inadequate number of viral copies(<138 copies/mL). A negative result must be combined with clinical observations, patient history, and epidemiological information. The expected result is Negative.  Fact Sheet for Patients:  EntrepreneurPulse.com.au  Fact Sheet for Healthcare Providers:  IncredibleEmployment.be  This test is no t yet approved or cleared  by the [place] and  has been authorized for detection and/or diagnosis of SARS-CoV-2 by FDA under an Emergency Use Authorization (EUA). This EUA will remain  in effect (meaning this test can be used) for the duration of the COVID-19 declaration under Section 564(b)(1) of the Act, 21 U.S.C.section 360bbb-3(b)(1), unless the authorization is terminated  or revoked sooner.       Influenza A by PCR NEGATIVE NEGATIVE Final   Influenza B by PCR NEGATIVE NEGATIVE Final    Comment: (NOTE) The Xpert Xpress SARS-CoV-2/FLU/RSV plus assay is intended as an aid in the diagnosis of influenza from Nasopharyngeal swab specimens and should not be used as a sole basis for treatment. Nasal washings and aspirates are unacceptable for Xpert Xpress SARS-CoV-2/FLU/RSV testing.  Fact Sheet for Patients: EntrepreneurPulse.com.au  Fact Sheet for  Healthcare Providers: IncredibleEmployment.be  This test is not yet approved or cleared by the [place] FDA and has been authorized for detection and/or diagnosis of SARS-CoV-2 by FDA under an Emergency Use Authorization (EUA). This EUA will remain in effect (meaning this test can be used) for the duration of the COVID-19 declaration under Section 564(b)(1) of the Act, 21 U.S.C. section 360bbb-3(b)(1), unless the authorization is terminated or revoked.  Performed at [facility], [place] [address], [place], [place] [address]       Radiology Studies: CT Head Wo Contrast  Result Date: 04/10/2020 CLINICAL DATA:  Metastatic disease evaluation. EXAM: CT HEAD WITHOUT CONTRAST TECHNIQUE: Contiguous axial images were obtained from the base of the skull through the vertex without intravenous contrast. COMPARISON:  None. FINDINGS: Brain: No evidence of acute infarction, hemorrhage, hydrocephalus, extra-axial collection or mass lesion/mass effect. Mild scattered white matter hypoattenuation, which is nonspecific but most likely related to chronic microvascular ischemic disease. No midline shift. Vascular: Calcific atherosclerosis. Skull: Left occipital lytic calvarial lesion (series 3, image 12). Left frontal calvarial lytic lesion (series 3, image 16). Sinuses/Orbits: Visualized sinuses are clear.  Unremarkable orbits. Other: No mastoid effusions. IMPRESSION: 1. No evidence of acute intracranial abnormality. No obvious evidence of intracranial metastatic disease, although evaluation is limited on this noncontrast head CT. MRI with and without contrast could provide far more sensitive evaluation if clinically indicated. 2. Lytic calvarial lesions involving the left occipital and left frontal bones, concerning for osseous metastatic disease given the clinical history. Outside MRI (from 03/14/2020 in Care [facility]) describes additional areas of metastatic disease at  the craniocervical junction, which are not well evaluated on this study. Electronically Signed   By: Margaretha Sheffield MD   On: 04/10/2020 15:54   US Abdomen Complete  Result Date: 04/10/2020 CLINICAL DATA:  Abnormal liver enzymes.  Breast carcinoma. EXAM: ABDOMEN ULTRASOUND COMPLETE COMPARISON:  None. FINDINGS: Gallbladder: Sludge in the gallbladder. A negative sonographic Percell Miller sign was reported by the sonographer. No wall thickening or pericholecystic fluid. Common bile duct: Diameter: 2 mm Liver: No focal lesion identified. Within normal limits in parenchymal echogenicity. Portal vein is patent on color Doppler imaging with normal direction of blood flow towards the liver. IVC: No abnormality visualized. Pancreas: Visualized portion unremarkable. Spleen: Size and appearance within normal limits. Right Kidney: Length: 10.7 cm.  Mild hydronephrosis. Left Kidney: Length: 9.8 cm.  There are two 1.0 cm renal cyst. Abdominal aorta: No aneurysm visualized. There is atherosclerotic plaque. Other findings: None. IMPRESSION: 1. Mild right hydronephrosis. 2. Gallbladder sludge without other evidence of cholecystitis. 3.  Aortic atherosclerosis (ICD10-I70.0). Electronically Signed   By: Cletus Gash.D.  On: 04/10/2020 19:17      Scheduled Meds: . enoxaparin (LOVENOX) injection  40 mg Subcutaneous Q24H  . letrozole  2.5 mg Oral Daily  . multivitamin with minerals  1 tablet Oral Daily  . pantoprazole  40 mg Oral Daily  . sodium chloride flush  3 mL Intravenous Q12H   Continuous Infusions: . dextrose 5 % and 0.45 % NaCl with KCl 40 mEq/L       LOS: 1 day      Time spent: 35 minutes   Dessa Phi, DO [facility] 04/12/2020, 11:13 AM   Available via Epic secure chat 7am-7pm After these hours, please refer to coverage provider listed on [email or web address]

## 2020-04-12 NOTE — Evaluation (Signed)
Clinical/Bedside Swallow Evaluation Patient Details  Name: Jodi Kaufman MRN: [id number] Date of Birth: Jun 20, 1940  Today's Date: 04/12/2020 Time: SLP Start Time (ACUTE ONLY): 1055 SLP Stop Time (ACUTE ONLY): 1145 SLP Time Calculation (min) (ACUTE ONLY): 50 min  Past Medical History:  Past Medical History:  Diagnosis Date  . Breast cancer [facility])    Past Surgical History:  Past Surgical History:  Procedure Laterality Date  . BREAST BIOPSY    . MASTECTOMY    . MASTECTOMY W/ SENTINEL NODE BIOPSY     Right. 18 years ago.     HPI:  79yo female admitted 04/10/20 with xerostomia, N/V and poor po intake. PMH: metastatic breast cancer, anxiety, HLD, GERD, vitamin D deficiency   Assessment / Plan / Recommendation Clinical Impression  Pt was seen at bedside to evaluate swallow function and safety.  Suspect primary esophageal dysphagia, as oropharyngeal swallow appears to be within normal limits.   Pt was awake and alert. No family present. CN exam is unremarkable. Speech is fully intelligible. Pt is missing most of her teeth, and does not have dentures. Pt reports significant xerostomia, however, her oral cavity appears moist, pink, and healthy. She frequently spits out phlegm, reporting significant ongoing production. Suction was recommended to provide sanitary means of secretion disposal and minimize irritation to her lips from dry paper towels, however, pt declined.  Pt required significant encouragement to accept PO trials, but was willing to take boluses of ice chips (x1), sips of water, magic cup, and graham cracker dipped in coffee. Extended oral prep was noted across consistencies, as well as reduced laryngeal elevation per palpation. There was no overt s/s aspiration on any consistency. Pt reported discomfort in her stomach following PO trials, and was noted to have decreased expectoration of phlegm during PO intake. SLP left to write safe swallow precautions and observed that pt had  finished the coffee upon return to her room. Pt was very excited to have been able to eat and drink.   Recommend dys 2 diet (finely chopped) with thin liquids, meds per pt preference. Encouraged warm liquid (soup, hot tea, coffee) with each meal. Safe swallow precautions posted at [facility]. SLP will follow to assess diet tolerance and continue education. RN and MD informed.   SLP Visit Diagnosis: Dysphagia, unspecified (R13.10)    Aspiration Risk  Moderate aspiration risk;Risk for inadequate nutrition/hydration    Diet Recommendation Dysphagia 2 (Fine chop);Thin liquid   Liquid Administration via: Cup Medication Administration: Other (Comment) (per pt tolerance) Compensations: Minimize environmental distractions;Slow rate;Small sips/bites Postural Changes: Remain upright for at least 30 minutes after po intake;Seated upright at 90 degrees    Other  Recommendations Oral Care Recommendations: Oral care BID   Follow up Recommendations None      Frequency and Duration    1 week       Prognosis Prognosis for Safe Diet Advancement: Fair Barriers to Reach Goals: Behavior      Swallow Study   General Date of Onset: 04/10/20 HPI: 79yo female admitted 04/10/20 with xerostomia, N/V and poor po intake. PMH: metastatic breast cancer, anxiety, HLD, GERD, vitamin D deficiency Type of Study: Bedside Swallow Evaluation Previous Swallow Assessment: none Diet Prior to this Study: NPO Temperature Spikes Noted: No Respiratory Status: Room air History of Recent Intubation: No Behavior/Cognition: Alert;Cooperative;Pleasant mood;Distractible;Requires cueing (very anxious) Oral Cavity Assessment: Within Functional Limits Oral Care Completed by SLP: Recent completion by staff Oral Cavity - Dentition: Missing dentition Vision: Functional for self-feeding Self-Feeding Abilities: Able to  feed self Patient Positioning: Upright in bed Baseline Vocal Quality: Normal Volitional Cough: Strong Volitional  Swallow: Able to elicit    Oral/Motor/Sensory Function Overall Oral Motor/Sensory Function: Within functional limits   Ice Chips Ice chips: Not tested   Thin Liquid Thin Liquid: Within functional limits Presentation: Cup;Self Fed    Nectar Thick Nectar Thick Liquid: Not tested   Honey Thick Honey Thick Liquid: Not tested   Puree Puree: Within functional limits Presentation: Spoon   Solid     Presentation: Self Fed Other Comments: pt softened graham cracker in coffee     Bethannie Iglehart B. Quentin Ore, [facility], [place] Speech Language Pathologist Office: [phone] Pager: [phone]  Shonna Chock 04/12/2020,12:33 PM

## 2020-04-12 NOTE — Progress Notes (Signed)
Patient has refused to be NPO, She likes to talk to MD and rethink about her decision at this time. Per Patient, she will tell around afternoon time if she wants Peg tube or not. Lovey Newcomer, NP was notified.

## 2020-04-12 NOTE — Progress Notes (Signed)
PT Cancellation Note  Patient Details Name: Jodi Kaufman MRN: [id number] DOB: 1940-07-24   Cancelled Treatment:    Reason Eval/Treat Not Completed: Patient at procedure or test/unavailable Pt going to CT upon arrival to room.  Will check back as schedule permits.   Lamone Ferrelli,KATHrine E 04/12/2020, 2:44 PM Jannette Spanner PT, DPT Acute Rehabilitation Services Pager: [phone] Office: [phone]

## 2020-04-13 LAB — MAGNESIUM: Magnesium: 1.9 mg/dL (ref 1.7–2.4)

## 2020-04-13 LAB — COMPREHENSIVE METABOLIC PANEL
ALT: 106 U/L — ABNORMAL HIGH (ref 0–44)
AST: 71 U/L — ABNORMAL HIGH (ref 15–41)
Albumin: 2.6 g/dL — ABNORMAL LOW (ref 3.5–5.0)
Alkaline Phosphatase: 493 U/L — ABNORMAL HIGH (ref 38–126)
Anion gap: 8 (ref 5–15)
BUN: 14 mg/dL (ref 8–23)
CO2: 26 mmol/L (ref 22–32)
Calcium: 8.6 mg/dL — ABNORMAL LOW (ref 8.9–10.3)
Chloride: 109 mmol/L (ref 98–111)
Creatinine, Ser: 0.5 mg/dL (ref 0.44–1.00)
GFR, Estimated: >60 mL/min (ref >60)
Glucose, Bld: 115 mg/dL — ABNORMAL HIGH (ref 70–99)
Potassium: 4 mmol/L (ref 3.5–5.1)
Sodium: 143 mmol/L (ref 135–145)
Total Bilirubin: 1.1 mg/dL (ref 0.3–1.2)
Total Protein: 4.7 g/dL — ABNORMAL LOW (ref 6.5–8.1)

## 2020-04-13 LAB — PROTIME-INR
INR: 1.3 — ABNORMAL HIGH (ref 0.8–1.2)
Prothrombin Time: 15.2 seconds (ref 11.4–15.2)

## 2020-04-13 MED ORDER — DEXTROSE-NACL 5-0.45 % IV SOLN: INTRAVENOUS | Status: AC

## 2020-04-13 MED ORDER — KCL IN DEXTROSE-NACL 40-5-0.45 MEQ/L-%-% IV SOLN: INTRAVENOUS | Status: DC

## 2020-04-13 NOTE — Consult Note (Signed)
Consultation Note Date: 04/13/2020   Patient Name: Jodi Kaufman  DOB: [birth year]  MRN: [id number]  Age / Sex: 79 y.o., female  PCP: Hali Marry, MD Referring Physician: Dessa Phi, DO  Reason for Consultation: Establishing goals of care  HPI/Patient Profile: 79 y.o. female  with past medical history of breast cancer, newly diagnosed metastatic breast cancer anxiety, hyperlipidemia, GERD, vitamin D deficiency admitted on 04/10/2020 with nausea, vomiting and poor oral intake.  She was recently admitted to [facility] in [place] and was discharged home a couple weeks ago.  She had been admitted there for new onset of back pain and found to have metastatic breast cancer and is status post radiation.  She moved to [place] to be closer to her daughter and establish with Dr. Lindi Adie with plan to start on Ibrance and letrozole.  Palliative consulted for goals of care.  Clinical Assessment and Goals of Care: I met today with [name].  We discussed clinical course as well as wishes moving forward in regard to advanced directives and care plan this hospitalization.  Values and goals of care important to patient and family were attempted to be elicited.  She is a very anxious individual and is limited in her insight into her overall situation as she becomes fixated on small details of her care plan due to anxiety and feelings of loss of control.  I spent time talking with her about her fears and concerns about recommended care plan.  She seems to benefit from looking in each individual task or test that is coming up, explaining the reasoning for it, and coaching her on exact expectations on what is going to occur.  She is invested in plan to have PEG tube placed, however, she is worried about not having her daughters present when it is done.  I called and spoke with her daughters individually.   They both confirm that anxiety is a driving force in [name]'s life and that she needs continuous reassurance and patience to work through her understanding of individual tests and medications and how they relate to her overall situation.  I discussed with him plan to focus on getting CT to evaluate her anatomy for possible PEG tube placement early next week (possibly Monday).  Questions and concerns addressed.   PMT will continue to support holistically.  SUMMARY OF RECOMMENDATIONS   -Full code/full scope treatment -[name] is invested in plan to have PEG tube placed and to pursue continued chemotherapy for her metastatic disease.  At the same time, she is very anxious about everything going on and feels as though she is "losing control" as she is overwhelmed by multiple tests, medications, and restrictions to things she is allowed to do would have.  She does not carry a formal psychiatric diagnosis, however, her anxiety and frustration with her perception of loss of autonomy certainly impact her care and need to be taken into consideration. -I discussed with interventional radiology team and she needs to have CT scan completed before we  can assess if she could have PEG tube placement with IR services.  She has been nervous about getting CT scan done but I explained the reasoning for it and told her that we would premedicate her with Ativan prior to CT.  She is agreeable today to getting CT to evaluate anatomy completed. 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  It is unclear to me, however, exactly what fields she had radiated and if this would correlate clinically with her current symptoms.  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  Appreciate speech input greatly. -It would likely be Monday at the earliest when PEG tube could be placed by IR.  She is requesting diet today and I discussed with Dr. Maylene Roes who will order. -She would like her daughters to be present at time of PEG tube placement.  She was requesting to schedule procedure late in the evening (after 7 PM) so her daughters could come after work.  I explained to her that this will be a procedure that we will be during normal business hours (likely on Monday) and they would need to make arrangements to be here during the day if they want to be present at time of the procedure. -We will plan to follow-up this weekend.  Code Status/Advance Care Planning:  Full code   Symptom Management:   Anxiety: ativan as needed  Palliative Prophylaxis:   Delirium Protocol and Frequent Pain Assessment  Additional Recommendations (Limitations, Scope, Preferences):  Full Scope Treatment  Psycho-social/Spiritual:   Desire for further Chaplaincy support:no  Additional Recommendations: Caregiving  Support/Resources  Prognosis:   Unable to determine  Discharge Planning: To Be Determined      Primary Diagnoses: Present on Admission: . Failure to thrive in adult   I have reviewed the medical record, interviewed the patient and family, and examined the patient. The following aspects are pertinent.  Past Medical History:  Diagnosis Date  . Breast cancer [facility])    Social History   Socioeconomic History  . Marital status: Single    Spouse name: Not on file  . Number of children: Not on file  . Years of education: Not on file  . Highest education level: Not on file  Occupational History  . Not on file  Tobacco Use  . Smoking status: Never Smoker  . Smokeless tobacco: Never Used  Substance and Sexual Activity  . Alcohol use:  Never    Comment: [place].    . Drug use: Never  . Sexual activity: Not Currently  Other Topics Concern  . Not on file  Social History Narrative   Lives in [place] but stays with her daughter 1-2 weeks out of the month.     Social Determinants of Health   Financial Resource Strain: Not on file  Food Insecurity: Not on file  Transportation Needs: Not on file  Physical Activity: Not on file  Stress: Not on file  Social Connections: Not on file   Family History  Problem Relation Age of Onset  . Heart disease Mother   . Anemia Mother  penicous anemia.   . Alzheimer's disease Mother    Scheduled Meds: . enoxaparin (LOVENOX) injection  40 mg Subcutaneous Q24H  . multivitamin with minerals  1 tablet Oral Daily  . pantoprazole  40 mg Oral Daily  . sodium chloride flush  3 mL Intravenous Q12H   Continuous Infusions: PRN Meds:.acetaminophen **OR** acetaminophen, antiseptic oral rinse, ondansetron **OR** ondansetron (ZOFRAN) IV, oxyCODONE, polyethylene glycol Medications Prior to Admission:  Prior to Admission medications   Medication Sig Start Date End Date Taking? Authorizing Provider  Calcium Citrate (CITRACAL PO) Take 1 tablet by mouth in the morning, at noon, in the evening, and at bedtime.   Yes [provider]  letrozole (FEMARA) 2.5 MG tablet Take 1 tablet (2.5 mg total) by mouth daily. 04/01/20  Yes Nicholas Lose, MD  LORazepam (ATIVAN) 1 MG tablet Take 0.5 mg by mouth at bedtime.   Yes [provider]  omeprazole (PRILOSEC) 20 MG capsule Take 20 mg by mouth daily.   Yes [provider]  ondansetron (ZOFRAN ODT) 4 MG disintegrating tablet Take 1 tablet (4 mg total) by mouth every 8 (eight) hours as needed for nausea or vomiting. 04/01/20  Yes Nicholas Lose, MD  oxyCODONE (OXY IR/ROXICODONE) 5 MG immediate release tablet Take 2.5 mg by mouth every 4 (four) hours as needed for severe pain.   Yes [provider]  PSE-DM-APAP & PSE-DM-CPM-APAP  (TYLENOL COLD & FLU DAY/NIGHT) 30-15-500 & 30- 15-2-$RemoveBe'500MG'axHqctEPW$  TBPK Take 5 mLs by mouth 2 (two) times daily as needed (pain).   Yes [provider]  palbociclib (IBRANCE) 100 MG tablet Take 1 tablet (100 mg total) by mouth daily. Take for 21 days on, 7 days off, repeat every 28 days. Patient not taking: No sig reported 04/02/20   Nicholas Lose, MD   Allergies  Allergen Reactions  . Atorvastatin     Other reaction(s): Other (comments) Side effects  . Clavulanic Acid Hives  . Tamoxifen Nausea Only   Review of Systems  Constitutional: Positive for appetite change and fatigue.  HENT: Positive for trouble swallowing.   Neurological: Positive for weakness.  Psychiatric/Behavioral: Positive for agitation and sleep disturbance. The patient is nervous/anxious.    Physical Exam General: Alert, awake, anxious HEENT: No bruits, no goiter, no JVD Heart: Regular rate and rhythm. No murmur appreciated. Lungs: Good air movement, clear Abdomen: Soft, nontender, nondistended, positive bowel sounds.  Ext: No significant edema Skin: Warm and dry Neuro: Grossly intact, nonfocal.  Vital Signs: BP 111/70 (BP Location: Left Arm)   Pulse 64   Temp 97.7 F (36.5 C) (Oral)   Resp 18   Ht $R'5\' 6"'YV$  (1.676 m)   Wt 47.5 kg   SpO2 98%   BMI 16.90 kg/m  Pain Scale: 0-10   Pain Score: 0-No pain   SpO2: SpO2: 98 % O2 Device:SpO2: 98 % O2 Flow Rate: .   IO: Intake/output summary:   Intake/Output Summary (Last 24 hours) at 04/13/2020 0919 Last data filed at 04/13/2020 0818 Gross per 24 hour  Intake 1583.05 ml  Output --  Net 1583.05 ml    LBM: Last BM Date: 04/09/20 Baseline Weight: Weight: 47.5 kg Most recent weight: Weight: 47.5 kg     Palliative Assessment/Data:   Flowsheet Rows   Flowsheet Row Most Recent Value  Intake Tab   Referral Department Hospitalist  Unit at Time of Referral Oncology Unit  Palliative Care Primary Diagnosis Cancer  Date Notified 04/10/20  Palliative Care  Type New Palliative care  Reason  for referral Clarify Goals of Care  Date of Admission 04/10/20  Date first seen by Palliative Care 04/12/20  # of days Palliative referral response time 2 Day(s)  # of days IP prior to Palliative referral 0  Clinical Assessment   Palliative Performance Scale Score 50%  Psychosocial & Spiritual Assessment   Palliative Care Outcomes   Patient/Family meeting held? Yes  Who was at the meeting? patient      Time In: 1120 Time Out: 1240 Time Total: 80 Greater than 50%  of this time was spent counseling and coordinating care related to the above assessment and plan.  Signed by: Micheline Rough, MD   Please contact Palliative Medicine Team phone at [phone] for questions and concerns.  For individual provider: See Shea Evans

## 2020-04-13 NOTE — Evaluation (Signed)
Physical Therapy Evaluation Patient Details Name: Jodi Kaufman MRN: [id number] DOB: October 18, 1940 Today's Date: 04/13/2020   History of Present Illness  79 y.o. female with medical history significant of metastatic breast cancer, vitamin D deficiency, mild emphysema, generalized anxiety disorder, hyperlipidemia and GERD came to [facility] ED with complaint of nausea, vomiting and poor p.o. intake. Patient was discharged several weeks ago from [facility] in [place]. Pt with metastatic breast cancer, with mets to ribs, spine, pelvis, s/p treatment for spinal mets.  Clinical Impression   Pt presents with generalized weakness, impaired mobility, impaired standing balance, and decreased activity tolerance. Pt to benefit from acute PT to address deficits. Pt ambulated short room distance, requiring close guard for safety and use of RW. Pt reports having difficulty mobilizing at home, along with use of "diapers" for toileting. PT recommending home equipment below to maximize pt safety at home, PT recommending 24/7 assist from family initially at d/c as well as [facility] services. PT to progress mobility as tolerated, and will continue to follow acutely.      Follow Up Recommendations Home health PT;Supervision/Assistance - 24 hour    Equipment Recommendations  Rolling walker with 5" wheels;3in1 (PT)    Recommendations for Other Services       Precautions / Restrictions Precautions Precautions: Fall Precaution Comments: spinal mets Restrictions Weight Bearing Restrictions: No      Mobility  Bed Mobility Overal bed mobility: Needs Assistance Bed Mobility: Supine to Sit     Supine to sit: Supervision          Transfers Overall transfer level: Needs assistance Equipment used: Rolling walker (2 wheeled) Transfers: Sit to/from Stand Sit to Stand: Min guard         General transfer comment: for safety, close guard mostly for pt comfort as pt demonstrates anxiety and fear of falling during  mobility  Ambulation/Gait Ambulation/Gait assistance: Min guard;+2 safety/equipment Gait Distance (Feet): 8 Feet Assistive device: Rolling walker (2 wheeled) Gait Pattern/deviations: Step-through pattern;Decreased stride length;Trunk flexed Gait velocity: decr   General Gait Details: close guard for safety, verbal cuing for RW and room navigation  Stairs            Wheelchair Mobility    Modified Rankin (Stroke Patients Only)       Balance Overall balance assessment: Needs assistance Sitting-balance support: No upper extremity supported;Feet supported Sitting balance-Leahy Scale: Good     Standing balance support: During functional activity;Bilateral upper extremity supported Standing balance-Leahy Scale: Fair Standing balance comment: requires use of external support dynamically                             Pertinent Vitals/Pain Pain Assessment: Faces Faces Pain Scale: Hurts a little bit Pain Location: back, throat secondary to phlegm Pain Descriptors / Indicators: Sore;Discomfort;Grimacing Pain Intervention(s): Limited activity within patient's tolerance;Monitored during session;Repositioned    Home Living Family/patient expects to be discharged to:: Private residence Living Arrangements: Children Available Help at Discharge: Family Type of Home: House Home Access: Stairs to enter   Technical brewer of Steps: 20 Home Layout: One level Home Equipment: Tub bench Additional Comments: Living with her daughter here, but lives in [place]. Multiple seps with landings to enter daughter's home. Drove to the front door (through the yard) before to circumvent steps. Reports she was walking some and transferring to [facility] but has had a decline from the last 4 raditation treatments. Reports walking with assistance of  her daughter but predominantly limited to her room between 8-4 (due to house mate working from home).    Prior  Function Level of Independence: Needs assistance   Gait / Transfers Assistance Needed: Assistance of daughter using "bear hug" to stand and take steps. Reports tolerance of x10 steps at a time  ADL's / Homemaking Assistance Needed: Assistance with ADLs. Bed bath/ sponge assistance.        Hand Dominance   Dominant Hand: Right    Extremity/Trunk Assessment   Upper Extremity Assessment Upper Extremity Assessment: Defer to OT evaluation    Lower Extremity Assessment Lower Extremity Assessment: Generalized weakness    Cervical / Trunk Assessment Cervical / Trunk Assessment: Kyphotic;Other exceptions Cervical / Trunk Exceptions: cachectic appearance  Communication   Communication: No difficulties  Cognition Arousal/Alertness: Awake/alert Behavior During Therapy: WFL for tasks assessed/performed;Anxious Overall Cognitive Status: No family/caregiver present to determine baseline cognitive functioning                                 General Comments: Tangential in conversation, pt is easily distractible. Pt reports anxiety with mobility, demonstrates fear of falling      General Comments      Exercises General Exercises - Lower Extremity Ankle Circles/Pumps: AROM;Both;10 reps;Seated Quad Sets: AROM;Both;10 reps;Seated   Assessment/Plan    PT Assessment Patient needs continued PT services  PT Problem List Decreased strength;Decreased mobility;Decreased safety awareness;Decreased activity tolerance;Decreased balance;Decreased knowledge of use of DME;Pain       PT Treatment Interventions DME instruction;Therapeutic activities;Gait training;Therapeutic exercise;Patient/family education;Balance training;Stair training;Functional mobility training;Neuromuscular re-education    PT Goals (Current goals can be found in the Care Plan section)  Acute Rehab PT Goals Patient Stated Goal: To walk and be more active PT Goal Formulation: With patient Time For Goal  Achievement: 05/04/20 Potential to Achieve Goals: Good    Frequency Min 3X/week   Barriers to discharge        Co-evaluation PT/OT/SLP Co-Evaluation/Treatment: Yes Reason for Co-Treatment: For patient/therapist safety;To address functional/ADL transfers;Necessary to address cognition/behavior during functional activity PT goals addressed during session: Mobility/safety with mobility;Balance;Proper use of DME;Strengthening/ROM         AM-PAC PT "6 Clicks" Mobility  Outcome Measure Help needed turning from your back to your side while in a flat bed without using bedrails?: A Little Help needed moving from lying on your back to sitting on the side of a flat bed without using bedrails?: A Little Help needed moving to and from a bed to a chair (including a wheelchair)?: A Little Help needed standing up from a chair using your arms (e.g., wheelchair or bedside chair)?: A Little Help needed to walk in hospital room?: A Little Help needed climbing 3-5 steps with a railing? : A Lot 6 Click Score: 17    End of Session Equipment Utilized During Treatment: Gait belt   Patient left: in chair;with chair alarm set;with call bell/phone within reach Nurse Communication: Mobility status PT Visit Diagnosis: Other abnormalities of gait and mobility (R26.89)    Time: 1859-0931 PT Time Calculation (min) (ACUTE ONLY): 39 min   Charges:   PT Evaluation $PT Eval Low Complexity: 1 Low          Nassir Neidert S, PT Acute Rehabilitation Services Pager [phone]  Office [phone]   Titiana Severa E Ruffin Pyo 04/13/2020, 1:48 PM

## 2020-04-13 NOTE — Evaluation (Signed)
Occupational Therapy Evaluation Patient Details Name: Jodi Kaufman MRN: [id number] DOB: 28-Dec-1940 Today's Date: 04/13/2020    History of Present Illness 79 y.o. female with medical history significant of metastatic breast cancer, vitamin D deficiency, mild emphysema, generalized anxiety disorder, hyperlipidemia and GERD came to [facility] ED with complaint of nausea, vomiting and poor p.o. intake. Patient was discharged several weeks ago from [facility] in [place]. Pt with metastatic breast cancer, with mets to ribs, spine, pelvis, s/p treatment for spinal mets.   Clinical Impression   Jodi Kaufman is a 79 year old woman with above medical history who presents with generalized weakness and decreased activity tolerance resulting in a significant decline in functional abilities. On evaluation patient set up and predominately from seated position for UB ADLs, min assist for LB ADLs and toileting. Patient min guard for standing and ambulating short distance with RW. Patient self limiting - she thinks she is weaker than she is and also dealing with anxiety - resulting in wanting to control all things. Patient will benefit from skilled OT services while in hospital to improve deficits and learn compensatory strategies as needed in order to improve functional abilities to reduce caregiver burden. Should be able to discharge home with family - if she has 24/7 supervision.        Follow Up Recommendations  Home health OT    Equipment Recommendations  3 in 1 bedside commode    Recommendations for Other Services       Precautions / Restrictions Precautions Precautions: Fall Restrictions Weight Bearing Restrictions: No      Mobility Bed Mobility Overal bed mobility: Needs Assistance Bed Mobility: Supine to Sit     Supine to sit: Supervision          Transfers Overall transfer level: Needs assistance Equipment used: Rolling walker (2 wheeled) Transfers: Sit to/from Stand Sit  to Stand: Min guard         General transfer comment: min guard to ambulate partially around bed to sit in recliner with RW.    Balance Overall balance assessment: Needs assistance Sitting-balance support: No upper extremity supported;Feet supported Sitting balance-Leahy Scale: Good     Standing balance support: During functional activity;Bilateral upper extremity supported Standing balance-Leahy Scale: Fair                             ADL either performed or assessed with clinical judgement   ADL Overall ADL's : Needs assistance/impaired Eating/Feeding: Independent   Grooming: Set up;Sitting   Upper Body Bathing: Set up;Sitting   Lower Body Bathing: Minimal assistance;Sit to/from stand   Upper Body Dressing : Set up;Sitting   Lower Body Dressing: Set up;Minimal assistance Lower Body Dressing Details (indicate cue type and reason): demonstrates ability to perform figure 4 position to don LB clothing. Toilet Transfer: Min Camera operator and Hygiene: Sit to/from stand;Minimal assistance               Vision Patient Visual Report: No change from baseline       Perception     Praxis      Pertinent Vitals/Pain Pain Assessment: Faces Faces Pain Scale: Hurts a little bit Pain Location: back Pain Intervention(s): Monitored during session     Hand Dominance Right   Extremity/Trunk Assessment Upper Extremity Assessment Upper Extremity Assessment: Generalized weakness   Lower Extremity Assessment Lower Extremity Assessment: Defer to PT evaluation   Cervical / Trunk Assessment  Cervical / Trunk Assessment: Kyphotic   Communication Communication Communication: No difficulties   Cognition Arousal/Alertness: Awake/alert Behavior During Therapy: WFL for tasks assessed/performed Overall Cognitive Status: Within Functional Limits for tasks assessed                                      General Comments       Exercises     Shoulder Instructions      Home Living Family/patient expects to be discharged to:: Private residence Living Arrangements: Children Available Help at Discharge: Family Type of Home: House Home Access: Stairs to enter Technical brewer of Steps: [place]: One level     Bathroom Shower/Tub: Teacher, early years/pre: Standard     Home Equipment: Tub bench   Additional Comments: Living with her daughter here, but lives in [place]. Multiple seps with landings to enter home. Drove to the front door (through the yard) before to circumvent steps. Reports she was walking some and transferring to [facility] at [facility] but has had a decline from the last 4 raditation treatments. Reports walking with assistance of her daughter but predominantly limited to her room between 8-4 (due to house mate working from home).      Prior Functioning/Environment Level of Independence: Needs assistance  Gait / Transfers Assistance Needed: Assistance of daughter using "bear hug" to s tand. ADL's / Homemaking Assistance Needed: Assistance with ADLs. Bed bath/ sponge assistance.            OT Problem List: Decreased strength;Decreased activity tolerance;Impaired balance (sitting and/or standing);Decreased knowledge of use of DME or AE;Pain      OT Treatment/Interventions: Self-care/ADL training;Therapeutic exercise;DME and/or AE instruction;Therapeutic activities;Patient/family education    OT Goals(Current goals can be found in the care plan section) Acute Rehab OT Goals Patient Stated Goal: To walk and be more active OT Goal Formulation: With patient Time For Goal Achievement: 04/27/20 Potential to Achieve Goals: Good  OT Frequency: Min 2X/week   Barriers to D/C:            Co-evaluation              AM-PAC OT "6 Clicks" Daily Activity     Outcome Measure Help from another person eating meals?: None Help from  another person taking care of personal grooming?: A Little Help from another person toileting, which includes using toliet, bedpan, or urinal?: A Little Help from another person bathing (including washing, rinsing, drying)?: A Little Help from another person to put on and taking off regular upper body clothing?: A Little Help from another person to put on and taking off regular lower body clothing?: A Little 6 Click Score: 19   End of Session Equipment Utilized During Treatment: Gait belt;Rolling walker Nurse Communication: Mobility status  Activity Tolerance: Patient tolerated treatment well Patient left: in chair;with call bell/phone within reach;with chair alarm set  OT Visit Diagnosis: Muscle weakness (generalized) (M62.81);Pain;Adult, failure to thrive (R62.7) Pain - part of body:  (back)                Time: 8341-9622 OT Time Calculation (min): 40 min Charges:  OT General Charges $OT Visit: 1 Visit OT Evaluation $OT Eval Moderate Complexity: 1 Mod  Thanvi Blincoe, OTR/L [place]  Office [phone] Pager: [place] 04/13/2020, 12:41 PM

## 2020-04-13 NOTE — Progress Notes (Signed)
PROGRESS NOTE    [place]  OYD:[id number] DOB: 25-Jun-1940 DOA: 04/10/2020 PCP: Hali Marry, MD     Brief Narrative:  Jodi Kaufman is a 79 year old female with past medical history significant for breast cancer, newly diagnosed metastatic breast cancer, generalized anxiety disorder, hyperlipidemia, GERD, vitamin D deficiency who presented to the hospital with complaints of nausea, vomiting and poor oral intake.  Patient was recently admitted to [facility] in [place] and was discharged home several weeks ago.  At that time, patient had complained of back and hip pain and was found to have metastatic breast cancer with multiple bony lesions.  She received radiation well inpatient.  She has since followed up with Dr. Lindi Adie from oncology and was started on Ibrance with letrozole.  Patient met with oncology, was recommended for PEG placement.  New events last 24 hours / Subjective: No new physical complaints.  Very concerned about PEG tube placement, how it will be done, and wants both daughters present during the procedure.  I did explain our visitation restrictions and let her know that only one daughter may be allowed in.  Assessment & Plan:   Active Problems:   Failure to thrive in adult   Protein-calorie malnutrition, severe   Failure to thrive with metastatic breast cancer -Consulted oncology, after conversation, has recommended PEG tube placement  -IVF   Hypernatremia -Continue D5 half-normal saline.  Awaiting repeat blood work this morning  AKI -Resolved, creatinine back to baseline.  Blood work pending this morning  Right upper quadrant abdominal pain with elevated liver enzyme -Right upper quadrant ultrasound gallbladder sludge without evidence of cholecystitis -Trend LFT, pending this morning  Anxiety -Ativan PRN   Goals of care -Appreciate palliative care medicine involvement   DVT prophylaxis:  enoxaparin (LOVENOX) injection 40 mg Start:  04/10/20 2200 Code Status: Full code Family Communication: None at bedside Disposition Plan:  Status is: Inpatient  Remains inpatient appropriate because:Ongoing diagnostic testing needed not appropriate for outpatient work up, IV treatments appropriate due to intensity of illness or inability to take PO and Inpatient level of care appropriate due to severity of illness   Dispo: The patient is from: Home              Anticipated d/c is to: Home              Anticipated d/c date is: > 3 days              Patient currently is not medically stable to d/c.  Likely PEG tube placement early next week    Consultants:   Oncology  Palliative care medicine   Procedures:   None   Antimicrobials:  Anti-infectives (From admission, onward)   None       Objective: Vitals:   04/11/20 2053 04/12/20 0553 04/12/20 2015 04/13/20 0506  BP:  137/73 (!) 103/59 111/70  Pulse:  70 68 64  Resp:  $Remo'16 16 18  'kIhQK$ Temp:  97.7 F (36.5 C) (!) 97.4 F (36.3 C) 97.7 F (36.5 C)  TempSrc: Oral Oral Oral Oral  SpO2:  100% 100% 98%  Weight:      Height:        Intake/Output Summary (Last 24 hours) at 04/13/2020 1237 Last data filed at 04/13/2020 0818 Gross per 24 hour  Intake 1583.05 ml  Output --  Net 1583.05 ml   Filed Weights   04/10/20 1946  Weight: 47.5 kg    Examination: General exam: Appears comfortable  Respiratory system:  Clear to auscultation. Respiratory effort normal. Cardiovascular system: S1 & S2 heard, RRR. No pedal edema. Gastrointestinal system: Abdomen is nondistended, soft and nontender. Normal bowel sounds heard. Central nervous system: Alert and oriented. Non focal exam.  Extremities: Symmetric in appearance bilaterally  Skin: No rashes, lesions or ulcers on exposed skin  Psychiatry: Judgement and insight appear poor, anxious  Data Reviewed: I have personally reviewed following labs and imaging studies  CBC: Recent Labs  Lab 04/10/20 1259 04/11/20 0549  WBC  6.4 4.8  NEUTROABS 5.5  --   HGB 19.7* 15.4*  HCT 60.3* 46.3*  MCV 92.1 90.6  PLT 279 230   Basic Metabolic Panel: Recent Labs  Lab 04/10/20 1259 04/11/20 0549 04/12/20 0621  NA 142 145 147*  K 3.8 3.8 3.2*  CL 102 107 109  CO2 21* 28 28  GLUCOSE 163* 150* 113*  BUN 65* 36* 22  CREATININE 1.01* 0.62 0.58  CALCIUM 10.0 9.1 9.2   GFR: Estimated Creatinine Clearance: 43.5 mL/min (by C-G formula based on SCr of 0.58 mg/dL). Liver Function Tests: Recent Labs  Lab 04/10/20 1259 04/11/20 0549 04/12/20 0621  AST 55* 44* 89*  ALT 82* 60* 97*  ALKPHOS 613* 459* 476*  BILITOT 2.0* 1.4* 1.3*  PROT 7.0 5.1* 4.7*  ALBUMIN 4.0 2.7* 2.6*   No results for input(s): LIPASE, AMYLASE in the last 168 hours. No results for input(s): AMMONIA in the last 168 hours. Coagulation Profile: Recent Labs  Lab 04/13/20 0816  INR 1.3*   Cardiac Enzymes: No results for input(s): CKTOTAL, CKMB, CKMBINDEX, TROPONINI in the last 168 hours. BNP (last 3 results) No results for input(s): PROBNP in the last 8760 hours. HbA1C: No results for input(s): HGBA1C in the last 72 hours. CBG: No results for input(s): GLUCAP in the last 168 hours. Lipid Profile: No results for input(s): CHOL, HDL, LDLCALC, TRIG, CHOLHDL, LDLDIRECT in the last 72 hours. Thyroid Function Tests: No results for input(s): TSH, T4TOTAL, FREET4, T3FREE, THYROIDAB in the last 72 hours. Anemia Panel: No results for input(s): VITAMINB12, FOLATE, FERRITIN, TIBC, IRON, RETICCTPCT in the last 72 hours. Sepsis Labs: Recent Labs  Lab 04/10/20 1259 04/10/20 1817 04/10/20 2050  LATICACIDVEN 3.6* 2.2* 1.8    Recent Results (from the past 240 hour(s))  Resp Panel by RT-PCR (Flu A&B, Covid) Nasopharyngeal Swab     Status: None   Collection Time: 04/10/20  2:36 PM   Specimen: Nasopharyngeal Swab; Nasopharyngeal(NP) swabs in vial transport medium  Result Value Ref Range Status   SARS Coronavirus 2 by RT PCR NEGATIVE NEGATIVE Final     Comment: (NOTE) SARS-CoV-2 target nucleic acids are NOT DETECTED.  The SARS-CoV-2 RNA is generally detectable in upper respiratory specimens during the acute phase of infection. The lowest concentration of SARS-CoV-2 viral copies this assay can detect is 138 copies/mL. A negative result does not preclude SARS-Cov-2 infection and should not be used as the sole basis for treatment or other patient management decisions. A negative result may occur with  improper specimen collection/handling, submission of specimen other than nasopharyngeal swab, presence of viral mutation(s) within the areas targeted by this assay, and inadequate number of viral copies(<138 copies/mL). A negative result must be combined with clinical observations, patient history, and epidemiological information. The expected result is Negative.  Fact Sheet for Patients:  BloggerCourse.com  Fact Sheet for Healthcare Providers:  SeriousBroker.it  This test is no t yet approved or cleared by the [place] and  has been authorized for  detection and/or diagnosis of SARS-CoV-2 by FDA under an Emergency Use Authorization (EUA). This EUA will remain  in effect (meaning this test can be used) for the duration of the COVID-19 declaration under Section 564(b)(1) of the Act, 21 U.S.C.section 360bbb-3(b)(1), unless the authorization is terminated  or revoked sooner.       Influenza A by PCR NEGATIVE NEGATIVE Final   Influenza B by PCR NEGATIVE NEGATIVE Final    Comment: (NOTE) The Xpert Xpress SARS-CoV-2/FLU/RSV plus assay is intended as an aid in the diagnosis of influenza from Nasopharyngeal swab specimens and should not be used as a sole basis for treatment. Nasal washings and aspirates are unacceptable for Xpert Xpress SARS-CoV-2/FLU/RSV testing.  Fact Sheet for Patients: EntrepreneurPulse.com.au  Fact Sheet for Healthcare  Providers: IncredibleEmployment.be  This test is not yet approved or cleared by the [place] FDA and has been authorized for detection and/or diagnosis of SARS-CoV-2 by FDA under an Emergency Use Authorization (EUA). This EUA will remain in effect (meaning this test can be used) for the duration of the COVID-19 declaration under Section 564(b)(1) of the Act, 21 U.S.C. section 360bbb-3(b)(1), unless the authorization is terminated or revoked.  Performed at [facility], [place] [address] [place], [place] [address]       Radiology Studies: CT ABDOMEN WO CONTRAST  Result Date: 04/12/2020 CLINICAL DATA:  79 year old female referred for a workup of possible percutaneous gastrostomy. Known malignancy with metastases. EXAM: CT ABDOMEN WITHOUT CONTRAST TECHNIQUE: Multidetector CT imaging of the abdomen was performed following the standard protocol without IV contrast. COMPARISON:  None. FINDINGS: Lower chest: Expansile lesion of the inferior right eighth rib on image 17 of series 4. Right ninth rib lesion on image 45 of series 4. Left sixth rib lesion incompletely imaged on image 2 of series 4. Atelectatic changes at the lung bases.  Coronary calcifications. Hepatobiliary: Low-density lesion within the medial aspect of segment 4 B of the liver, poorly characterized on noncontrast CT. Unremarkable gallbladder. Pancreas: Unremarkable Spleen: Unremarkable Adrenals/Urinary Tract: Unremarkable adrenal glands. Unremarkable kidneys. Stomach/Bowel: Stomach decompressed. No inflammatory changes. Unremarkable visualized small bowel without abnormal distention. Visualized colon unremarkable. Vascular/Lymphatic: Atherosclerotic calcifications of the abdominal aorta and proximal iliac arteries. Other: None Musculoskeletal: Lytic lesion within the right iliac crest. Destructive bone lesion within the left L4 lamina. Lytic lesion within the L3 spinous process. Destructive lytic  lesion within the left aspect of the L3 vertebral body. Destructive lytic lesion within the left aspect of the L2 vertebral body. Sclerotic lesion at the anterior inferior L1 vertebral body. Mixed sclerotic and lucent lesion in the right aspect of T12 vertebral body. Destructive lesion with lucent and sclerotic features within the T11 vertebral body. No bony canal narrowing. IMPRESSION: Bony metastatic disease involving the axial and appendicular skeleton, which is compatible with the given history of known malignancy and metastases, as above. Low-density lesion of segment 4B in the liver, concerning for liver metastases though incompletely characterized on the noncontrast CT. Electronically Signed   By: Corrie Mckusick D.O.   On: 04/12/2020 15:12   DG ESOPHAGUS W SINGLE CM (SOL OR THIN BA)  Result Date: 04/12/2020 CLINICAL DATA:  Dysphagia, unspecified. EXAM: ESOPHOGRAM/BARIUM SWALLOW TECHNIQUE: Single contrast examination was performed using thin barium or water soluble. FLUOROSCOPY TIME:  Fluoroscopy Time:  1 minutes, 12 seconds. Radiation Exposure Index (if provided by the fluoroscopic device): 2.6 mGy Number of Acquired Spot Images: 0 COMPARISON:  Same day CT abdomen 04/12/2020. FINDINGS: Significantly limited examination due to  the patient's limited ability to reposition and inability to drink at a rate sufficient to fully distend the esophagus. Additionally, the examination was prematurely terminated as the patient became sick and began to vomit. There was prompt passage of contrast through the esophagus and into the proximal stomach. Within the described limitations, the esophagus is smooth in contour. The distal esophagus/GE junction was not observed to fully distend. However, this may be due to the patient's inability to drink at a sufficient rate. No evidence of hiatal hernia. No gastroesophageal reflux was observed. IMPRESSION: Significantly limited and prematurely terminated examination, as  described. Prompt passage of contrast through the esophagus and into the proximal stomach. The distal esophagus/GE junction was not observed to fully distend. However, this may be due to the patient's inability to drink at a sufficient rate. No evidence of hiatal hernia. No gastroesophageal reflux was observed. Electronically Signed   By: Kellie Simmering DO   On: 04/12/2020 15:42      Scheduled Meds: . enoxaparin (LOVENOX) injection  40 mg Subcutaneous Q24H  . multivitamin with minerals  1 tablet Oral Daily  . pantoprazole  40 mg Oral Daily  . sodium chloride flush  3 mL Intravenous Q12H   Continuous Infusions:    LOS: 2 days      Time spent: 25 minutes   Dessa Phi, DO [facility] 04/13/2020, 12:37 PM   Available via Epic secure chat 7am-7pm After these hours, please refer to coverage provider listed on [email or web address]

## 2020-04-14 LAB — COMPREHENSIVE METABOLIC PANEL
ALT: 79 U/L — ABNORMAL HIGH (ref 0–44)
AST: 46 U/L — ABNORMAL HIGH (ref 15–41)
Albumin: 2.5 g/dL — ABNORMAL LOW (ref 3.5–5.0)
Alkaline Phosphatase: 557 U/L — ABNORMAL HIGH (ref 38–126)
Anion gap: 8 (ref 5–15)
BUN: 11 mg/dL (ref 8–23)
CO2: 26 mmol/L (ref 22–32)
Calcium: 8.4 mg/dL — ABNORMAL LOW (ref 8.9–10.3)
Chloride: 106 mmol/L (ref 98–111)
Creatinine, Ser: 0.57 mg/dL (ref 0.44–1.00)
GFR, Estimated: >60 mL/min (ref >60)
Glucose, Bld: 100 mg/dL — ABNORMAL HIGH (ref 70–99)
Potassium: 3.9 mmol/L (ref 3.5–5.1)
Sodium: 140 mmol/L (ref 135–145)
Total Bilirubin: 1 mg/dL (ref 0.3–1.2)
Total Protein: 4.8 g/dL — ABNORMAL LOW (ref 6.5–8.1)

## 2020-04-14 MED ORDER — CEFAZOLIN SODIUM-DEXTROSE 2-4 GM/100ML-% IV SOLN
2.0000 g | INTRAVENOUS | Status: AC
  Filled 2020-04-14: qty 100

## 2020-04-14 MED ORDER — ENOXAPARIN SODIUM 40 MG/0.4ML ~~LOC~~ SOLN: 40.0000 mg | SUBCUTANEOUS | Status: DC

## 2020-04-14 NOTE — Progress Notes (Signed)
Chief Complaint: Patient was seen in consultation today for gastrostomy tube  Referring Physician(s): Dr. Dessa Phi  Supervising Physician: Aletta Edouard  Patient Status: [facility] - In-pt  History of Present Illness: Jodi Kaufman is a 79 y.o. female with hx of metastatic breast cancer. She has developed some dysphagia with failure to thrive and protein calorie malnutrition. They have discussed having a feeding tube placed to supplement nutrition and the pt is agreeable to move forward. PMHx, meds, labs, imaging, allergies reviewed. CT scan shows anatomy amenable to safe percutaneous approach. Feels well, no recent fevers, chills, illness.    Past Medical History:  Diagnosis Date  . Breast cancer [facility])     Past Surgical History:  Procedure Laterality Date  . BREAST BIOPSY    . MASTECTOMY    . MASTECTOMY W/ SENTINEL NODE BIOPSY     Right. 18 years ago.      Allergies: Atorvastatin, Clavulanic acid, and Tamoxifen  Medications:  Current Facility-Administered Medications:  .  acetaminophen (TYLENOL) tablet 650 mg, 650 mg, Oral, Q6H PRN **OR** acetaminophen (TYLENOL) suppository 650 mg, 650 mg, Rectal, Q6H PRN, Lorella Nimrod, MD .  antiseptic oral rinse (BIOTENE) solution 15 mL, 15 mL, Mouth Rinse, PRN, Amin, Sumayya, MD .  enoxaparin (LOVENOX) injection 40 mg, 40 mg, Subcutaneous, Q24H, Amin, Soundra Pilon, MD, 40 mg at 04/13/20 2257 .  multivitamin with minerals tablet 1 tablet, 1 tablet, Oral, Daily, Maylene Roes, Jennifer, DO .  ondansetron (ZOFRAN) tablet 4 mg, 4 mg, Oral, Q6H PRN **OR** ondansetron (ZOFRAN) injection 4 mg, 4 mg, Intravenous, Q6H PRN, Lorella Nimrod, MD .  oxyCODONE (Oxy IR/ROXICODONE) immediate release tablet 2.5 mg, 2.5 mg, Oral, Q4H PRN, Dessa Phi, DO .  pantoprazole (PROTONIX) EC tablet 40 mg, 40 mg, Oral, Daily, Amin, Sumayya, MD .  polyethylene glycol (MIRALAX / GLYCOLAX) packet 17 g, 17 g, Oral, Daily PRN, Lorella Nimrod, MD .  sodium chloride flush  (NS) 0.9 % injection 3 mL, 3 mL, Intravenous, Q12H, Lorella Nimrod, MD, 3 mL at 04/13/20 2257    Family History  Problem Relation Age of Onset  . Heart disease Mother   . Anemia Mother        penicous anemia.   . Alzheimer's disease Mother     Social History   Socioeconomic History  . Marital status: Single    Spouse name: Not on file  . Number of children: Not on file  . Years of education: Not on file  . Highest education level: Not on file  Occupational History  . Not on file  Tobacco Use  . Smoking status: Never Smoker  . Smokeless tobacco: Never Used  Substance and Sexual Activity  . Alcohol use: Never    Comment: [place].    . Drug use: Never  . Sexual activity: Not Currently  Other Topics Concern  . Not on file  Social History Narrative   Lives in [place] but stays with her daughter 1-2 weeks out of the month.     Social Determinants of Health   Financial Resource Strain: Not on file  Food Insecurity: Not on file  Transportation Needs: Not on file  Physical Activity: Not on file  Stress: Not on file  Social Connections: Not on file     Review of Systems: A 12 point ROS discussed and pertinent positives are indicated in the HPI above.  All other systems are negative.  Review of Systems  Vital Signs: BP 128/64 (BP Location: Left Arm)   Pulse Marland Kitchen)  55   Temp 97.7 F (36.5 C) (Oral)   Resp 16   Ht 5\' 6"  (1.676 m)   Wt 47.5 kg   SpO2 97%   BMI 16.90 kg/m   Physical Exam Constitutional:      Appearance: Normal appearance. She is not ill-appearing.  HENT:     Mouth/Throat:     Mouth: Mucous membranes are moist.     Pharynx: Oropharynx is clear.  Cardiovascular:     Rate and Rhythm: Normal rate and regular rhythm.     Heart sounds: Normal heart sounds.  Pulmonary:     Effort: Pulmonary effort is normal. No respiratory distress.     Breath sounds: Normal breath sounds.  Abdominal:     General: Abdomen is flat. There is no distension.     Palpations:  Abdomen is soft.     Tenderness: There is no abdominal tenderness.  Skin:    General: Skin is warm and dry.  Neurological:     General: No focal deficit present.     Mental Status: She is alert and oriented to person, place, and time.  Psychiatric:        Mood and Affect: Mood normal.        Thought Content: Thought content normal.        Judgment: Judgment normal.      Imaging: CT ABDOMEN WO CONTRAST  Result Date: 04/12/2020 CLINICAL DATA:  79 year old female referred for a workup of possible percutaneous gastrostomy. Known malignancy with metastases. EXAM: CT ABDOMEN WITHOUT CONTRAST TECHNIQUE: Multidetector CT imaging of the abdomen was performed following the standard protocol without IV contrast. COMPARISON:  None. FINDINGS: Lower chest: Expansile lesion of the inferior right eighth rib on image 17 of series 4. Right ninth rib lesion on image 45 of series 4. Left sixth rib lesion incompletely imaged on image 2 of series 4. Atelectatic changes at the lung bases.  Coronary calcifications. Hepatobiliary: Low-density lesion within the medial aspect of segment 4 B of the liver, poorly characterized on noncontrast CT. Unremarkable gallbladder. Pancreas: Unremarkable Spleen: Unremarkable Adrenals/Urinary Tract: Unremarkable adrenal glands. Unremarkable kidneys. Stomach/Bowel: Stomach decompressed. No inflammatory changes. Unremarkable visualized small bowel without abnormal distention. Visualized colon unremarkable. Vascular/Lymphatic: Atherosclerotic calcifications of the abdominal aorta and proximal iliac arteries. Other: None Musculoskeletal: Lytic lesion within the right iliac crest. Destructive bone lesion within the left L4 lamina. Lytic lesion within the L3 spinous process. Destructive lytic lesion within the left aspect of the L3 vertebral body. Destructive lytic lesion within the left aspect of the L2 vertebral body. Sclerotic lesion at the anterior inferior L1 vertebral body. Mixed  sclerotic and lucent lesion in the right aspect of T12 vertebral body. Destructive lesion with lucent and sclerotic features within the T11 vertebral body. No bony canal narrowing. IMPRESSION: Bony metastatic disease involving the axial and appendicular skeleton, which is compatible with the given history of known malignancy and metastases, as above. Low-density lesion of segment 4B in the liver, concerning for liver metastases though incompletely characterized on the noncontrast CT. Electronically Signed   By: Corrie Mckusick D.O.   On: 04/12/2020 15:12   CT Head Wo Contrast  Result Date: 04/10/2020 CLINICAL DATA:  Metastatic disease evaluation. EXAM: CT HEAD WITHOUT CONTRAST TECHNIQUE: Contiguous axial images were obtained from the base of the skull through the vertex without intravenous contrast. COMPARISON:  None. FINDINGS: Brain: No evidence of acute infarction, hemorrhage, hydrocephalus, extra-axial collection or mass lesion/mass effect. Mild scattered white matter hypoattenuation, which is nonspecific  but most likely related to chronic microvascular ischemic disease. No midline shift. Vascular: Calcific atherosclerosis. Skull: Left occipital lytic calvarial lesion (series 3, image 12). Left frontal calvarial lytic lesion (series 3, image 16). Sinuses/Orbits: Visualized sinuses are clear.  Unremarkable orbits. Other: No mastoid effusions. IMPRESSION: 1. No evidence of acute intracranial abnormality. No obvious evidence of intracranial metastatic disease, although evaluation is limited on this noncontrast head CT. MRI with and without contrast could provide far more sensitive evaluation if clinically indicated. 2. Lytic calvarial lesions involving the left occipital and left frontal bones, concerning for osseous metastatic disease given the clinical history. Outside MRI (from 03/14/2020 in Care everywhere) describes additional areas of metastatic disease at the craniocervical junction, which are not well  evaluated on this study. Electronically Signed   By: Margaretha Sheffield MD   On: 04/10/2020 15:54   US Abdomen Complete  Result Date: 04/10/2020 CLINICAL DATA:  Abnormal liver enzymes.  Breast carcinoma. EXAM: ABDOMEN ULTRASOUND COMPLETE COMPARISON:  None. FINDINGS: Gallbladder: Sludge in the gallbladder. A negative sonographic Percell Miller sign was reported by the sonographer. No wall thickening or pericholecystic fluid. Common bile duct: Diameter: 2 mm Liver: No focal lesion identified. Within normal limits in parenchymal echogenicity. Portal vein is patent on color Doppler imaging with normal direction of blood flow towards the liver. IVC: No abnormality visualized. Pancreas: Visualized portion unremarkable. Spleen: Size and appearance within normal limits. Right Kidney: Length: 10.7 cm.  Mild hydronephrosis. Left Kidney: Length: 9.8 cm.  There are two 1.0 cm renal cyst. Abdominal aorta: No aneurysm visualized. There is atherosclerotic plaque. Other findings: None. IMPRESSION: 1. Mild right hydronephrosis. 2. Gallbladder sludge without other evidence of cholecystitis. 3.  Aortic atherosclerosis (ICD10-I70.0). Electronically Signed   By: Ulyses Jarred M.D.   On: 04/10/2020 19:17   DG ESOPHAGUS W SINGLE CM (SOL OR THIN BA)  Result Date: 04/12/2020 CLINICAL DATA:  Dysphagia, unspecified. EXAM: ESOPHOGRAM/BARIUM SWALLOW TECHNIQUE: Single contrast examination was performed using thin barium or water soluble. FLUOROSCOPY TIME:  Fluoroscopy Time:  1 minutes, 12 seconds. Radiation Exposure Index (if provided by the fluoroscopic device): 2.6 mGy Number of Acquired Spot Images: 0 COMPARISON:  Same day CT abdomen 04/12/2020. FINDINGS: Significantly limited examination due to the patient's limited ability to reposition and inability to drink at a rate sufficient to fully distend the esophagus. Additionally, the examination was prematurely terminated as the patient became sick and began to vomit. There was prompt passage  of contrast through the esophagus and into the proximal stomach. Within the described limitations, the esophagus is smooth in contour. The distal esophagus/GE junction was not observed to fully distend. However, this may be due to the patient's inability to drink at a sufficient rate. No evidence of hiatal hernia. No gastroesophageal reflux was observed. IMPRESSION: Significantly limited and prematurely terminated examination, as described. Prompt passage of contrast through the esophagus and into the proximal stomach. The distal esophagus/GE junction was not observed to fully distend. However, this may be due to the patient's inability to drink at a sufficient rate. No evidence of hiatal hernia. No gastroesophageal reflux was observed. Electronically Signed   By: Kellie Simmering DO   On: 04/12/2020 15:42    Labs:  CBC: Recent Labs    11/09/19 1407 04/01/20 1651 04/10/20 1259 04/11/20 0549  WBC 7.4 9.5 6.4 4.8  HGB 15.3 17.9* 19.7* 15.4*  HCT 47.3* 53.8* 60.3* 46.3*  PLT 265 125* 279 230    COAGS: Recent Labs    04/13/20 0816  INR 1.3*    BMP: Recent Labs    11/09/19 1407 04/01/20 1651 04/11/20 0549 04/12/20 0621 04/13/20 1106 04/14/20 0615  NA 141   < > 145 147* 143 140  K 4.3   < > 3.8 3.2* 4.0 3.9  CL 103   < > 107 109 109 106  CO2 29   < > 28 28 26 26   GLUCOSE 100*   < > 150* 113* 115* 100*  BUN 25   < > 36* 22 14 11   CALCIUM 10.3   < > 9.1 9.2 8.6* 8.4*  CREATININE 0.88   < > 0.62 0.58 0.50 0.57  GFRNONAA 63   < > >60 >60 >60 >60  GFRAA 73  --   --   --   --   --    < > = values in this interval not displayed.    LIVER FUNCTION TESTS: Recent Labs    04/11/20 0549 04/12/20 0621 04/13/20 1106 04/14/20 0615  BILITOT 1.4* 1.3* 1.1 1.0  AST 44* 89* 71* 46*  ALT 60* 97* 106* 79*  ALKPHOS 459* 476* 493* 557*  PROT 5.1* 4.7* 4.7* 4.8*  ALBUMIN 2.7* 2.6* 2.6* 2.5*    TUMOR MARKERS: No results for input(s): AFPTM, CEA, CA199, CHROMGRNA in the last 8760  hours.  Assessment and Plan: Metastatic breast cancer Failure to thrive, PCM Plan for G-tube if time tomorrow, schedule permitting. NPO p MN Hold Lovenox Risks and benefits image guided gastrostomy tube placement was discussed with the patient including, but not limited to the need for a barium enema during the procedure, bleeding, infection, peritonitis and/or damage to adjacent structures.  All of the patient's questions were answered, patient is agreeable to proceed.  Consent signed and in chart.   Thank you for this interesting consult.  I greatly enjoyed meeting [place] and look forward to participating in their care.  A copy of this report was sent to the requesting provider on this date.  Electronically Signed: Ascencion Dike, PA-C 04/14/2020, 2:31 PM   I spent a total of 30 minutes in face to face in clinical consultation, greater than 50% of which was counseling/coordinating care for G-tube placement

## 2020-04-14 NOTE — Progress Notes (Incomplete)
Patient Care Team: Hali Marry, MD as PCP - General (Family Medicine)  DIAGNOSIS: No diagnosis found.  SUMMARY OF ONCOLOGIC HISTORY: Oncology History  Metastatic breast cancer ([place])  1995 Initial Biopsy   Right breast cancer stage IIb ER positive IDC s/p mastectomy and 2 months of tamoxifen, lost to follow-up (treatment at [facility])   03/12/2020 - 03/26/2020 Radiation Therapy   Palliative radiation to the brain and spine for brain metastases   03/14/2020 Initial Diagnosis   Scans on 03/14/2020: Multiple bone metastases and brain metastases, biopsy of the rib: Breast cancer ER 80% PR negative HER-2 negative.  Moved to [facility] December 2021 to be closer to her family     CHIEF COMPLIANT: Follow-up of metastatic breast cancer  INTERVAL HISTORY: Jodi Kaufman is a 79 y.o. with above-mentioned history of metastatic breast cancer with osseous metastases who is currently on treatment with Ibrance and letrozole. She was extremely weak due to a loss of appetite and was admitted from the ED on 04/10/20 to undergo a PEG tube placement. She presents to the clinic today for a toxicity check.   ALLERGIES:  is allergic to atorvastatin, clavulanic acid, and tamoxifen.  MEDICATIONS:  No current facility-administered medications for this visit.   No current outpatient medications on file.   Facility-Administered Medications Ordered in Other Visits  Medication Dose Route Frequency Provider Last Rate Last Admin  . acetaminophen (TYLENOL) tablet 650 mg  650 mg Oral Q6H PRN Lorella Nimrod, MD       Or  . acetaminophen (TYLENOL) suppository 650 mg  650 mg Rectal Q6H PRN Lorella Nimrod, MD      . antiseptic oral rinse (BIOTENE) solution 15 mL  15 mL Mouth Rinse PRN Lorella Nimrod, MD      . Derrill Memo ON 04/15/2020] ceFAZolin (ANCEF) IVPB 2g/100 mL premix  2 g Intravenous On Call Ascencion Dike, PA-C      . multivitamin with minerals tablet 1 tablet  1 tablet Oral Daily Dessa Phi, DO      . ondansetron (ZOFRAN) tablet 4 mg  4 mg Oral Q6H PRN Lorella Nimrod, MD       Or  . ondansetron (ZOFRAN) injection 4 mg  4 mg Intravenous Q6H PRN Lorella Nimrod, MD      . oxyCODONE (Oxy IR/ROXICODONE) immediate release tablet 2.5 mg  2.5 mg Oral Q4H PRN Dessa Phi, DO      . pantoprazole (PROTONIX) EC tablet 40 mg  40 mg Oral Daily Amin, Soundra Pilon, MD      . polyethylene glycol (MIRALAX / GLYCOLAX) packet 17 g  17 g Oral Daily PRN Lorella Nimrod, MD      . sodium chloride flush (NS) 0.9 % injection 3 mL  3 mL Intravenous Q12H Lorella Nimrod, MD   3 mL at 04/13/20 2257    PHYSICAL EXAMINATION: ECOG PERFORMANCE STATUS: {CHL ONC ECOG [phone]}  There were no vitals filed for this visit. There were no vitals filed for this visit.   LABORATORY DATA:  I have reviewed the data as listed CMP Latest Ref Rng & Units 04/14/2020 04/13/2020 04/12/2020  Glucose 70 - 99 mg/dL 100(H) 115(H) 113(H)  BUN 8 - 23 mg/dL _0 Creatinine 0.44 - 1.00 mg/dL 0.57 0.50 0.58  Sodium 135 - 145 mmol/L 140 143 147(H)  Potassium 3.5 - 5.1 mmol/L 3.9 4.0 3.2(L)  Chloride 98 - 111 mmol/L 106 109 109  CO2 22 - 32 mmol/L _1 Calcium 8.9 -  10.3 mg/dL 8.4(L) 8.6(L) 9.2  Total Protein 6.5 - 8.1 g/dL 4.8(L) 4.7(L) 4.7(L)  Total Bilirubin 0.3 - 1.2 mg/dL 1.0 1.1 1.3(H)  Alkaline Phos 38 - 126 U/L 557(H) 493(H) 476(H)  AST 15 - 41 U/L 46(H) 71(H) 89(H)  ALT 0 - 44 U/L 79(H) 106(H) 97(H)    Lab Results  Component Value Date   WBC 4.8 04/11/2020   HGB 15.4 (H) 04/11/2020   HCT 46.3 (H) 04/11/2020   MCV 90.6 04/11/2020   PLT 230 04/11/2020   NEUTROABS 5.5 04/10/2020    ASSESSMENT & PLAN:  No problem-specific Assessment & Plan notes found for this encounter.    No orders of the defined types were placed in this encounter.  The patient has a good understanding of the overall plan. she agrees with it. she will call with any problems that may develop before the next visit  here.  Total time spent: *** mins including face to face time and time spent for planning, charting and coordination of care  Nicholas Lose, MD 04/14/2020  I, Jodi Kaufman, am acting as scribe for Dr. Nicholas Lose.  {insert scribe attestation}

## 2020-04-14 NOTE — Progress Notes (Signed)
PROGRESS NOTE    [place]  IWP:[id number] DOB: [birth year] DOA: 04/10/2020 PCP: Hali Marry, MD   Brief Narrative:Jodi Kaufman is a 79 year old female with past medical history significant for breast cancer, newly diagnosed metastatic breast cancer, generalized anxiety disorder, hyperlipidemia, GERD, vitamin D deficiency who presented to the hospital with complaints of nausea, vomiting and poor oral intake.  Patient was recently admitted to [facility] in [place] and was discharged home several weeks ago.  At that time, patient had complained of back and hip pain and was found to have metastatic breast cancer with multiple bony lesions.  She received radiation well inpatient.  She has since followed up with Dr. Lindi Adie from oncology and was started on Ibrance with letrozole.  Patient met with oncology, was recommended for PEG placement.  Assessment & Plan:   Active Problems:   Failure to thrive in adult   Protein-calorie malnutrition, severe    Failure to thrive with metastatic breast cancer -Consulted oncology, after conversation, has recommended PEG tube placement in am hold lovenox  -IVF  Seen by PT Recommends hh pt   Hypernatremia -Continue D5 half-normal saline.  Awaiting repeat blood work this morning  AKI -Resolved, creatinine back to baseline.  Blood work pending this morning  Right upper quadrant abdominal pain with elevated liver enzyme -Right upper quadrant ultrasound gallbladder sludge without evidence of cholecystitis ALK PHOS 557 TRENDING UP AST AND ALT TRNDING DOWN  Anxiety -Ativan PRN   Goals of care -Appreciate palliative care medicine involvement      Nutrition Problem: Severe Malnutrition Etiology: chronic illness,cancer and cancer related treatments     Signs/Symptoms: percent weight loss,energy intake < or equal to 75% for > or equal to 1 month,severe fat depletion,severe muscle depletion Percent weight loss: 21 % (x 11  months)    Interventions: Refer to RD note for recommendations,Snacks,MVI  Estimated body mass index is 16.9 kg/m as calculated from the following:   Height as of this encounter: $RemoveBeforeD'5\' 6"'ngZLNmKvDTYrUm$  (1.676 m).   Weight as of this encounter: 47.5 kg.  DVT prophylaxis: SCD LOVENOX ON HOLD FOR PEG IN AM Code Status: Full code Family Communication: None at bedside Disposition Plan:  Status is: Inpatient  Remains inpatient appropriate because:Ongoing diagnostic testing needed not appropriate for outpatient work up, IV treatments appropriate due to intensity of illness or inability to take PO and Inpatient level of care appropriate due to severity of illness   Dispo: The patient is from: Home  Anticipated d/c is to: Home  Anticipated d/c date is: > 3 days  Patient currently is not medically stable to d/c.  Likely PEG tube placement IN AM  Consultants:   Oncology  Palliative care medicine   Procedures:   None   Antimicrobials:NONE  Subjective: VERY ANXIOUS ABOUT THE PROCEDURE  Objective: Vitals:   04/13/20 1410 04/13/20 2003 04/14/20 0450 04/14/20 1401  BP: 106/66 120/66 119/68 128/64  Pulse: 65 64 63 (!) 55  Resp: $Remo'16 15 19 16  'HKExn$ Temp: (!) 97.5 F (36.4 C) 97.7 F (36.5 C) (!) 97.4 F (36.3 C) 97.7 F (36.5 C)  TempSrc: Oral Oral Oral Oral  SpO2: 100% 97% 97% 97%  Weight:      Height:        Intake/Output Summary (Last 24 hours) at 04/14/2020 1534 Last data filed at 04/14/2020 1300 Gross per 24 hour  Intake 1439.16 ml  Output 550 ml  Net 889.16 ml   Filed Weights   04/10/20 1946  Weight: 47.5  kg    Examination:  General exam: Appears calm and comfortable  Respiratory system: Clear to auscultation. Respiratory effort normal. Cardiovascular system: S1 & S2 heard, RRR. No JVD, murmurs, rubs, gallops or clicks. No pedal edema. Gastrointestinal system: Abdomen is nondistended, soft and nontender. No organomegaly or masses felt.  Normal bowel sounds heard. Central nervous system: Alert and oriented. No focal neurological deficits. Extremities: Symmetric 5 x 5 power. Skin: No rashes, lesions or ulcers Psychiatry: Judgement and insight appear normal. Mood & affect appropriate.     Data Reviewed: I have personally reviewed following labs and imaging studies  CBC: Recent Labs  Lab 04/10/20 1259 04/11/20 0549  WBC 6.4 4.8  NEUTROABS 5.5  --   HGB 19.7* 15.4*  HCT 60.3* 46.3*  MCV 92.1 90.6  PLT 279 660   Basic Metabolic Panel: Recent Labs  Lab 04/10/20 1259 04/11/20 0549 04/12/20 0621 04/13/20 1106 04/14/20 0615  NA 142 145 147* 143 140  K 3.8 3.8 3.2* 4.0 3.9  CL 102 107 109 109 106  CO2 21* $Remov'28 28 26 26  'uXjodt$ GLUCOSE 163* 150* 113* 115* 100*  BUN 65* 36* $Remov'22 14 11  'zmmTuN$ CREATININE 1.01* 0.62 0.58 0.50 0.57  CALCIUM 10.0 9.1 9.2 8.6* 8.4*  MG  --   --   --  1.9  --    GFR: Estimated Creatinine Clearance: 43.5 mL/min (by C-G formula based on SCr of 0.57 mg/dL). Liver Function Tests: Recent Labs  Lab 04/10/20 1259 04/11/20 0549 04/12/20 0621 04/13/20 1106 04/14/20 0615  AST 55* 44* 89* 71* 46*  ALT 82* 60* 97* 106* 79*  ALKPHOS 613* 459* 476* 493* 557*  BILITOT 2.0* 1.4* 1.3* 1.1 1.0  PROT 7.0 5.1* 4.7* 4.7* 4.8*  ALBUMIN 4.0 2.7* 2.6* 2.6* 2.5*   No results for input(s): LIPASE, AMYLASE in the last 168 hours. No results for input(s): AMMONIA in the last 168 hours. Coagulation Profile: Recent Labs  Lab 04/13/20 0816  INR 1.3*   Cardiac Enzymes: No results for input(s): CKTOTAL, CKMB, CKMBINDEX, TROPONINI in the last 168 hours. BNP (last 3 results) No results for input(s): PROBNP in the last 8760 hours. HbA1C: No results for input(s): HGBA1C in the last 72 hours. CBG: No results for input(s): GLUCAP in the last 168 hours. Lipid Profile: No results for input(s): CHOL, HDL, LDLCALC, TRIG, CHOLHDL, LDLDIRECT in the last 72 hours. Thyroid Function Tests: No results for input(s): TSH,  T4TOTAL, FREET4, T3FREE, THYROIDAB in the last 72 hours. Anemia Panel: No results for input(s): VITAMINB12, FOLATE, FERRITIN, TIBC, IRON, RETICCTPCT in the last 72 hours. Sepsis Labs: Recent Labs  Lab 04/10/20 1259 04/10/20 1817 04/10/20 2050  LATICACIDVEN 3.6* 2.2* 1.8    Recent Results (from the past 240 hour(s))  Resp Panel by RT-PCR (Flu A&B, Covid) Nasopharyngeal Swab     Status: None   Collection Time: 04/10/20  2:36 PM   Specimen: Nasopharyngeal Swab; Nasopharyngeal(NP) swabs in vial transport medium  Result Value Ref Range Status   SARS Coronavirus 2 by RT PCR NEGATIVE NEGATIVE Final    Comment: (NOTE) SARS-CoV-2 target nucleic acids are NOT DETECTED.  The SARS-CoV-2 RNA is generally detectable in upper respiratory specimens during the acute phase of infection. The lowest concentration of SARS-CoV-2 viral copies this assay can detect is 138 copies/mL. A negative result does not preclude SARS-Cov-2 infection and should not be used as the sole basis for treatment or other patient management decisions. A negative result may occur with  improper specimen collection/handling,  submission of specimen other than nasopharyngeal swab, presence of viral mutation(s) within the areas targeted by this assay, and inadequate number of viral copies(<138 copies/mL). A negative result must be combined with clinical observations, patient history, and epidemiological information. The expected result is Negative.  Fact Sheet for Patients:  EntrepreneurPulse.com.au  Fact Sheet for Healthcare Providers:  IncredibleEmployment.be  This test is no t yet approved or cleared by the [place] FDA and  has been authorized for detection and/or diagnosis of SARS-CoV-2 by FDA under an Emergency Use Authorization (EUA). This EUA will remain  in effect (meaning this test can be used) for the duration of the COVID-19 declaration under Section 564(b)(1) of the  Act, 21 U.S.C.section 360bbb-3(b)(1), unless the authorization is terminated  or revoked sooner.       Influenza A by PCR NEGATIVE NEGATIVE Final   Influenza B by PCR NEGATIVE NEGATIVE Final    Comment: (NOTE) The Xpert Xpress SARS-CoV-2/FLU/RSV plus assay is intended as an aid in the diagnosis of influenza from Nasopharyngeal swab specimens and should not be used as a sole basis for treatment. Nasal washings and aspirates are unacceptable for Xpert Xpress SARS-CoV-2/FLU/RSV testing.  Fact Sheet for Patients: EntrepreneurPulse.com.au  Fact Sheet for Healthcare Providers: IncredibleEmployment.be  This test is not yet approved or cleared by the [place] FDA and has been authorized for detection and/or diagnosis of SARS-CoV-2 by FDA under an Emergency Use Authorization (EUA). This EUA will remain in effect (meaning this test can be used) for the duration of the COVID-19 declaration under Section 564(b)(1) of the Act, 21 U.S.C. section 360bbb-3(b)(1), unless the authorization is terminated or revoked.  Performed at [facility], [place] [address] [place], [place] [address]          Radiology Studies: No results found.      Scheduled Meds: . [START ON 04/15/2020] enoxaparin (LOVENOX) injection  40 mg Subcutaneous Q24H  . multivitamin with minerals  1 tablet Oral Daily  . pantoprazole  40 mg Oral Daily  . sodium chloride flush  3 mL Intravenous Q12H   Continuous Infusions: . [START ON 04/15/2020]  ceFAZolin (ANCEF) IV       LOS: 3 days    Georgette Shell, MD 04/14/2020, 3:34 PM

## 2020-04-15 ENCOUNTER — Inpatient Hospital Stay: Payer: Medicare Other | Admitting: Hematology and Oncology

## 2020-04-15 ENCOUNTER — Inpatient Hospital Stay: Payer: Medicare Other

## 2020-04-15 ENCOUNTER — Inpatient Hospital Stay (HOSPITAL_COMMUNITY): Payer: Medicare Other

## 2020-04-15 HISTORY — PX: IR GASTROSTOMY TUBE MOD SED: IMG625

## 2020-04-15 MED ORDER — MIDAZOLAM HCL 2 MG/2ML IJ SOLN
INTRAMUSCULAR | Status: AC
  Filled 2020-04-15: qty 4

## 2020-04-15 MED ORDER — GLUCAGON HCL RDNA (DIAGNOSTIC) 1 MG IJ SOLR
INTRAMUSCULAR | Status: AC
  Filled 2020-04-15: qty 1

## 2020-04-15 MED ORDER — ONDANSETRON HCL 4 MG/2ML IJ SOLN: 4.0000 mg | Freq: Four times a day (QID) | INTRAMUSCULAR | Status: DC | PRN

## 2020-04-15 MED ORDER — LIDOCAINE HCL 1 % IJ SOLN
INTRAMUSCULAR | Status: AC
  Filled 2020-04-15: qty 20

## 2020-04-15 MED ORDER — MIDAZOLAM HCL 2 MG/2ML IJ SOLN
INTRAMUSCULAR | Status: DC | PRN
  Administered 2020-04-15: 0.5 mg via INTRAVENOUS
  Administered 2020-04-15 (×3): 1 mg via INTRAVENOUS

## 2020-04-15 MED ORDER — OXYCODONE HCL 5 MG/5ML PO SOLN
2.5000 mg | ORAL | Status: DC | PRN
  Administered 2020-04-15 (×2): 2.5 mg
  Filled 2020-04-15 (×2): qty 5

## 2020-04-15 MED ORDER — LIDOCAINE-EPINEPHRINE 1 %-1:100000 IJ SOLN
INTRAMUSCULAR | Status: DC | PRN
  Administered 2020-04-15: 10 mL

## 2020-04-15 MED ORDER — CEFAZOLIN SODIUM-DEXTROSE 2-4 GM/100ML-% IV SOLN
INTRAVENOUS | Status: AC
  Administered 2020-04-15: 2 g via INTRAVENOUS
  Filled 2020-04-15: qty 100

## 2020-04-15 MED ORDER — FENTANYL CITRATE (PF) 100 MCG/2ML IJ SOLN
INTRAMUSCULAR | Status: DC | PRN
  Administered 2020-04-15 (×2): 50 ug via INTRAVENOUS

## 2020-04-15 MED ORDER — PANTOPRAZOLE SODIUM 40 MG PO PACK
40.0000 mg | PACK | Freq: Every day | ORAL | Status: DC
  Administered 2020-04-15 – 2020-04-18 (×4): 40 mg
  Filled 2020-04-15 (×4): qty 20

## 2020-04-15 MED ORDER — POLYETHYLENE GLYCOL 3350 17 G PO PACK: 17.0000 g | PACK | Freq: Every day | ORAL | Status: DC | PRN

## 2020-04-15 MED ORDER — ADULT MULTIVITAMIN W/MINERALS CH
1.0000 | ORAL_TABLET | Freq: Every day | ORAL | Status: DC
  Administered 2020-04-15 – 2020-04-18 (×4): 1
  Filled 2020-04-15 (×4): qty 1

## 2020-04-15 MED ORDER — ONDANSETRON HCL 4 MG PO TABS: 4.0000 mg | ORAL_TABLET | Freq: Four times a day (QID) | ORAL | Status: DC | PRN

## 2020-04-15 MED ORDER — IOHEXOL 300 MG/ML  SOLN
50.0000 mL | Freq: Once | INTRAMUSCULAR | Status: AC | PRN
  Administered 2020-04-15: 20 mL

## 2020-04-15 MED ORDER — FENTANYL CITRATE (PF) 100 MCG/2ML IJ SOLN
INTRAMUSCULAR | Status: AC
  Filled 2020-04-15: qty 2

## 2020-04-15 MED ORDER — FENTANYL CITRATE (PF) 100 MCG/2ML IJ SOLN
12.5000 ug | Freq: Once | INTRAMUSCULAR | Status: AC
  Administered 2020-04-15: 12.5 ug via INTRAVENOUS
  Filled 2020-04-15: qty 2

## 2020-04-15 MED ORDER — ENOXAPARIN SODIUM 40 MG/0.4ML ~~LOC~~ SOLN
40.0000 mg | SUBCUTANEOUS | Status: DC
  Administered 2020-04-16 – 2020-04-18 (×3): 40 mg via SUBCUTANEOUS
  Filled 2020-04-15 (×3): qty 0.4

## 2020-04-15 MED ORDER — GLUCAGON HCL (RDNA) 1 MG IJ SOLR
INTRAMUSCULAR | Status: DC | PRN
  Administered 2020-04-15: 1 mg via INTRAVENOUS

## 2020-04-15 NOTE — Progress Notes (Signed)
Daily Progress Note   Patient Name: Jodi Kaufman       Date: 04/15/2020 DOB: 1941/02/09  Age: 79 y.o. MRN#: [id number] Attending Physician: Georgette Shell, MD Primary Care Physician: Hali Marry, MD Admit Date: 04/10/2020  Reason for Consultation/Follow-up: Establishing goals of care  Subjective: I saw and examined Jodi Kaufman following PEG placement.  She seems more relaxed overall and is happy to have PEG in place.  She remains invested to continue with plan to f/u with oncology for further chemotherapy.  Length of Stay: 4  Current Medications: Scheduled Meds:  . [START ON 04/16/2020] enoxaparin (LOVENOX) injection  40 mg Subcutaneous Q24H  . multivitamin with minerals  1 tablet Per Tube Daily  . pantoprazole sodium  40 mg Per Tube Daily  . sodium chloride flush  3 mL Intravenous Q12H    Continuous Infusions:   PRN Meds: acetaminophen **OR** acetaminophen, antiseptic oral rinse, fentaNYL, glucagon, lidocaine-EPINEPHrine, midazolam, ondansetron **OR** ondansetron (ZOFRAN) IV, oxyCODONE, polyethylene glycol  Physical Exam         General: Alert, awake, anxious HEENT: No bruits, no goiter, no JVD Heart: Regular rate and rhythm. No murmur appreciated. Lungs: Good air movement, clear Abdomen: Soft, nontender, nondistended, positive bowel sounds.  Ext: No significant edema Skin: Warm and dry Neuro: Grossly intact, nonfocal.  Vital Signs: BP 117/65 (BP Location: Left Arm)   Pulse 79   Temp 97.9 F (36.6 C) (Oral)   Resp 16   Ht 5\' 6"  (1.676 m)   Wt 47.5 kg   SpO2 99%   BMI 16.90 kg/m  SpO2: SpO2: 99 % O2 Device: O2 Device: Room Air O2 Flow Rate: O2 Flow Rate (L/min): 2 L/min  Intake/output summary:   Intake/Output Summary (Last 24 hours) at  04/15/2020 2339 Last data filed at 04/15/2020 1905 Gross per 24 hour  Intake 200 ml  Output 50 ml  Net 150 ml   LBM: Last BM Date: 04/11/20 Baseline Weight: Weight: 47.5 kg Most recent weight: Weight: 47.5 kg       Palliative Assessment/Data:    Flowsheet Rows   Flowsheet Row Most Recent Value  Intake Tab   Referral Department Hospitalist  Unit at Time of Referral Oncology Unit  Palliative Care Primary Diagnosis Cancer  Date Notified 04/10/20  Palliative Care Type New Palliative care  Reason for referral Clarify Goals of Care  Date of Admission 04/10/20  Date first seen by Palliative Care 04/12/20  # of days Palliative referral response time 2 Day(s)  # of days IP prior to Palliative referral 0  Clinical Assessment   Palliative Performance Scale Score 50%  Psychosocial & Spiritual Assessment   Palliative Care Outcomes   Patient/Family meeting held? Yes  Who was at the meeting? patient      Patient Active Problem List   Diagnosis Date Noted  . Protein-calorie malnutrition, severe 04/11/2020  . Failure to thrive in adult 04/10/2020  . Dehydration   . Abnormal liver enzymes   . Metastatic breast cancer ([place]) 04/01/2020  . RUQ pain 11/09/2019  . Abnormal weight loss 11/09/2019  . Family history of colon cancer 08/16/2019  . Mild emphysema ([place]) 08/16/2019  . 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 Rheumatoid factor positive 07/06/2019  . Recurrent UTI 05/17/2019  . Hyperlipidemia 05/15/2018  . GAD (generalized anxiety disorder) 02/09/2018  . History of osteopenia 11/27/2015  . GERD (gastroesophageal reflux disease) 11/27/2015  . Vitamin D deficiency 11/06/2011  . Overactive bladder 10/12/2011  . History of right breast cancer 10/12/2011    Palliative Care Assessment & Plan   Patient Profile: 79 y.o. female  with past medical history of breast cancer, newly diagnosed metastatic breast cancer anxiety, hyperlipidemia, GERD, vitamin D deficiency admitted on  04/10/2020 with nausea, vomiting and poor oral intake.  She was recently admitted to [facility] in [place] and was discharged home a couple weeks ago.  She had been admitted there for new onset of back pain and found to have metastatic breast cancer and is status post radiation.  She moved to [place] to be closer to her daughter and establish with Dr. Lindi Adie with plan to start on Ibrance and letrozole.  Palliative consulted for goals of care.  Assessment: Patient Active Problem List   Diagnosis Date Noted  . Protein-calorie malnutrition, severe 04/11/2020  . Failure to thrive in adult 04/10/2020  . Dehydration   . Abnormal liver enzymes   . Metastatic breast cancer ([place]) 04/01/2020  . RUQ pain 11/09/2019  . Abnormal weight loss 11/09/2019  . Family history of colon cancer 08/16/2019  . Mild emphysema ([place]) 08/16/2019  . 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 Rheumatoid factor positive 07/06/2019  . Recurrent UTI 05/17/2019  . Hyperlipidemia 05/15/2018  . GAD (generalized anxiety disorder) 02/09/2018  . History of osteopenia 11/27/2015  . GERD (gastroesophageal reflux disease) 11/27/2015  . Vitamin D deficiency 11/06/2011  . Overactive bladder 10/12/2011  . History of right breast cancer 10/12/2011   Recommendations/Plan:  Full code/Full scope  She is s/p successful PEG placement.  Jodi Kaufman is invested in plan to have PEG tube placed and to pursue continued chemotherapy for her metastatic disease.    Goals are clear.  Palliative available as needed but will not follow daily.  Please call if there are specific areas with which we can be of assistance in the care of Jodi Kaufman moving forward.  Goals of Care and Additional Recommendations:  Limitations on Scope of Treatment: Full Scope Treatment  Code Status:    Code Status Orders  (From admission, onward)         Start     Ordered   04/10/20 1743  Full code  Continuous        04/10/20 1744        Code  Status History    This patient  has a current code status but no historical code status.   Advance Care Planning Activity       Prognosis:   Unable to determine  Discharge Planning:  Home with Home Health most likely  Care plan was discussed with patient  Thank you for allowing the Palliative Medicine Team to assist in the care of this patient.   Time In: 1430 Time Out: 1450 Total Time 20 Prolonged Time Billed No   Greater than 50%  of this time was spent counseling and coordinating care related to the above assessment and plan.  Micheline Rough, MD  Please contact Palliative Medicine Team phone at [phone] for questions and concerns.

## 2020-04-15 NOTE — Progress Notes (Signed)
PROGRESS NOTE    [place]  QJF:[id number] DOB: Aug 26, 1940 DOA: 04/10/2020 PCP: Hali Marry, MD   Brief Narrative:Jodi Kaufman is a 79 year old female with past medical history significant for breast cancer, newly diagnosed metastatic breast cancer, generalized anxiety disorder, hyperlipidemia, GERD, vitamin D deficiency who presented to the hospital with complaints of nausea, vomiting and poor oral intake.  Patient was recently admitted to [facility] in [place] and was discharged home several weeks ago.  At that time, patient had complained of back and hip pain and was found to have metastatic breast cancer with multiple bony lesions.  She received radiation well inpatient.  She has since followed up with Dr. Lindi Adie from oncology and was started on Ibrance with letrozole.  Patient met with oncology, was recommended for PEG placement.  Assessment & Plan:   Active Problems:   Failure to thrive in adult   Protein-calorie malnutrition, severe    Failure to thrive with metastatic breast cancer-patient scheduled to have PEG tube today.  Hypernatremia-resolved sodium 140 status post IV fluids.  AKI -Resolved, creatinine back to baseline.  Creatinine 0.57.    Right upper quadrant abdominal pain with elevated liver enzyme -Right upper quadrant ultrasound gallbladder sludge without evidence of cholecystitis ALK PHOS 557 TRENDING UP AST AND ALT TRNDING DOWN  Anxiety -Ativan PRN   Goals of care -Appreciate palliative care medicine involvement    Nutrition Problem: Severe Malnutrition Etiology: chronic illness,cancer and cancer related treatments     Signs/Symptoms: percent weight loss,energy intake < or equal to 75% for > or equal to 1 month,severe fat depletion,severe muscle depletion Percent weight loss: 21 % (x 11 months)    Interventions: Refer to RD note for recommendations,Snacks,MVI  Estimated body mass index is 16.9 kg/m as calculated from  the following:   Height as of this encounter: _0  (1.676 m).   Weight as of this encounter: 47.5 kg.  DVT prophylaxis: SCD LOVENOX ON HOLD FOR PEG IN AM Code Status: Full code Family Communication: None at bedside Disposition Plan:  Status is: Inpatient  Remains inpatient appropriate because:Ongoing diagnostic testing needed not appropriate for outpatient work up, IV treatments appropriate due to intensity of illness or inability to take PO and Inpatient level of care appropriate due to severity of illness   Dispo: The patient is from: Home  Anticipated d/c is to: Home  Anticipated d/c date is: 2 days  Patient currently is not medically stable to d/c.  Likely PEG tube placement IN AM  Consultants:   Oncology  Palliative care medicine   Procedures:   None   Antimicrobials:NONE  Subjective: Very anxious about the procedure continues to complain of dry mouth does not want to try saliva substitute Feels like she is going to choke on the mucus during procedure  Objective: Vitals:   04/14/20 0450 04/14/20 1401 04/14/20 2203 04/15/20 0542  BP: 119/68 128/64 121/79 122/69  Pulse: 63 (!) 55 72 77  Resp: _1 Temp: (!) 97.4 F (36.3 C) 97.7 F (36.5 C) 98.1 F (36.7 C) 97.8 F (36.6 C)  TempSrc: Oral Oral Oral Oral  SpO2: 97% 97% 98% 99%  Weight:      Height:        Intake/Output Summary (Last 24 hours) at 04/15/2020 1027 Last data filed at 04/15/2020 0555 Gross per 24 hour  Intake 117 ml  Output 550 ml  Net -433 ml   Filed Weights   04/10/20 1946  Weight: 47.5 kg  Examination:  General exam: Appears anxious Respiratory system: Clear to auscultation. Respiratory effort normal. Cardiovascular system: S1 & S2 heard, RRR. No JVD, murmurs, rubs, gallops or clicks. No pedal edema. Gastrointestinal system: Abdomen is nondistended, soft and nontender. No organomegaly or masses felt. Normal bowel sounds  heard. Central nervous system: Alert and oriented. No focal neurological deficits. Extremities: Symmetric 5 x 5 power. Skin: No rashes, lesions or ulcers Psychiatry: Judgement and insight appear normal. Mood & affect appropriate.     Data Reviewed: I have personally reviewed following labs and imaging studies  CBC: Recent Labs  Lab 04/10/20 1259 04/11/20 0549  WBC 6.4 4.8  NEUTROABS 5.5  --   HGB 19.7* 15.4*  HCT 60.3* 46.3*  MCV 92.1 90.6  PLT 279 161   Basic Metabolic Panel: Recent Labs  Lab 04/10/20 1259 04/11/20 0549 04/12/20 0621 04/13/20 1106 04/14/20 0615  NA 142 145 147* 143 140  K 3.8 3.8 3.2* 4.0 3.9  CL 102 107 109 109 106  CO2 21* _0 GLUCOSE 163* 150* 113* 115* 100*  BUN 65* 36* _1 CREATININE 1.01* 0.62 0.58 0.50 0.57  CALCIUM 10.0 9.1 9.2 8.6* 8.4*  MG  --   --   --  1.9  --    GFR: Estimated Creatinine Clearance: 43.5 mL/min (by C-G formula based on SCr of 0.57 mg/dL). Liver Function Tests: Recent Labs  Lab 04/10/20 1259 04/11/20 0549 04/12/20 0621 04/13/20 1106 04/14/20 0615  AST 55* 44* 89* 71* 46*  ALT 82* 60* 97* 106* 79*  ALKPHOS 613* 459* 476* 493* 557*  BILITOT 2.0* 1.4* 1.3* 1.1 1.0  PROT 7.0 5.1* 4.7* 4.7* 4.8*  ALBUMIN 4.0 2.7* 2.6* 2.6* 2.5*   No results for input(s): LIPASE, AMYLASE in the last 168 hours. No results for input(s): AMMONIA in the last 168 hours. Coagulation Profile: Recent Labs  Lab 04/13/20 0816  INR 1.3*   Cardiac Enzymes: No results for input(s): CKTOTAL, CKMB, CKMBINDEX, TROPONINI in the last 168 hours. BNP (last 3 results) No results for input(s): PROBNP in the last 8760 hours. HbA1C: No results for input(s): HGBA1C in the last 72 hours. CBG: No results for input(s): GLUCAP in the last 168 hours. Lipid Profile: No results for input(s): CHOL, HDL, LDLCALC, TRIG, CHOLHDL, LDLDIRECT in the last 72 hours. Thyroid Function Tests: No results for input(s): TSH, T4TOTAL, FREET4, T3FREE,  THYROIDAB in the last 72 hours. Anemia Panel: No results for input(s): VITAMINB12, FOLATE, FERRITIN, TIBC, IRON, RETICCTPCT in the last 72 hours. Sepsis Labs: Recent Labs  Lab 04/10/20 1259 04/10/20 1817 04/10/20 2050  LATICACIDVEN 3.6* 2.2* 1.8    Recent Results (from the past 240 hour(s))  Resp Panel by RT-PCR (Flu A&B, Covid) Nasopharyngeal Swab     Status: None   Collection Time: 04/10/20  2:36 PM   Specimen: Nasopharyngeal Swab; Nasopharyngeal(NP) swabs in vial transport medium  Result Value Ref Range Status   SARS Coronavirus 2 by RT PCR NEGATIVE NEGATIVE Final    Comment: (NOTE) SARS-CoV-2 target nucleic acids are NOT DETECTED.  The SARS-CoV-2 RNA is generally detectable in upper respiratory specimens during the acute phase of infection. The lowest concentration of SARS-CoV-2 viral copies this assay can detect is 138 copies/mL. A negative result does not preclude SARS-Cov-2 infection and should not be used as the sole basis for treatment or other patient management decisions. A negative result may occur with  improper specimen collection/handling, submission of specimen other than nasopharyngeal swab,  presence of viral mutation(s) within the areas targeted by this assay, and inadequate number of viral copies(<138 copies/mL). A negative result must be combined with clinical observations, patient history, and epidemiological information. The expected result is Negative.  Fact Sheet for Patients:  EntrepreneurPulse.com.au  Fact Sheet for Healthcare Providers:  IncredibleEmployment.be  This test is no t yet approved or cleared by the [place] FDA and  has been authorized for detection and/or diagnosis of SARS-CoV-2 by FDA under an Emergency Use Authorization (EUA). This EUA will remain  in effect (meaning this test can be used) for the duration of the COVID-19 declaration under Section 564(b)(1) of the Act, 21 U.S.C.section  360bbb-3(b)(1), unless the authorization is terminated  or revoked sooner.       Influenza A by PCR NEGATIVE NEGATIVE Final   Influenza B by PCR NEGATIVE NEGATIVE Final    Comment: (NOTE) The Xpert Xpress SARS-CoV-2/FLU/RSV plus assay is intended as an aid in the diagnosis of influenza from Nasopharyngeal swab specimens and should not be used as a sole basis for treatment. Nasal washings and aspirates are unacceptable for Xpert Xpress SARS-CoV-2/FLU/RSV testing.  Fact Sheet for Patients: EntrepreneurPulse.com.au  Fact Sheet for Healthcare Providers: IncredibleEmployment.be  This test is not yet approved or cleared by the [place] FDA and has been authorized for detection and/or diagnosis of SARS-CoV-2 by FDA under an Emergency Use Authorization (EUA). This EUA will remain in effect (meaning this test can be used) for the duration of the COVID-19 declaration under Section 564(b)(1) of the Act, 21 U.S.C. section 360bbb-3(b)(1), unless the authorization is terminated or revoked.  Performed at [facility], [place] [address], [place], [place] [address]          Radiology Studies: No results found.      Scheduled Meds: . multivitamin with minerals  1 tablet Oral Daily  . pantoprazole  40 mg Oral Daily  . sodium chloride flush  3 mL Intravenous Q12H   Continuous Infusions: .  ceFAZolin (ANCEF) IV       LOS: 4 days    Georgette Shell, MD 04/15/2020, 10:27 AM

## 2020-04-15 NOTE — Procedures (Signed)
Pre procedure Dx: Failure to thrive Post Procedure Dx: Same  Successful fluoroscopic guided insertion of gastrostomy tube.   The gastrostomy tube may be used immediately for medications.   Tube feeds may be initiated in 24 hours as per the primary team.    EBL: Minimal Complications: None immediate  Ronny Bacon, MD Pager #: [phone]

## 2020-04-15 NOTE — Progress Notes (Signed)
SLP Cancellation Note  Patient Details Name: Jodi Kaufman MRN: [id number] DOB: [birth year]   Cancelled treatment:       Reason Eval/Treat Not Completed: Patient at procedure or test/unavailable.  Pt in IR for PEG.     [name] 04/15/2020, 12:41 PM

## 2020-04-15 NOTE — Progress Notes (Signed)
MEDICATION-RELATED CONSULT NOTE   IR Procedure Consult - Anticoagulant/Antiplatelet PTA/Inpatient Med List Review by Pharmacist    Procedure: fluoroscopic guided insertion of gastrostomy tube    Completed: 04/15/20 ~1156  Post-Procedural bleeding risk per IR MD assessment:  standard  Antithrombotic medications on inpatient or PTA profile prior to procedure:   Lovenox 40mg  q24    Recommended restart time per IR Post-Procedure Guidelines:  Day + 1 (next AM)   Other considerations:      Plan:    Restart Lovenox 40mg  SQ daily to begin 12/21 AM  Adrian Saran, PharmD, BCPS 04/15/2020 12:14 PM

## 2020-04-15 NOTE — Progress Notes (Signed)
Daily Progress Note   Patient Name: Jodi Kaufman       Date: 04/15/2020 DOB: 1941/03/24  Age: 79 y.o. MRN#: [id number] Attending Physician: Georgette Shell, MD Primary Care Physician: Hali Marry, MD Admit Date: 04/10/2020  Reason for Consultation/Follow-up: Establishing goals of care  Subjective: I saw and examined Jodi Kaufman this evening.  She remains very anxious and gets agitated at times during encounter.  She continues to report feeling overwhelmed with lack of control as she cannot schedule her procedure at a specific time, cannot change hospital visitation policy, etc.  She is agreeable to PEG placement tomorrow and would like to proceed.  She wants her daughter to be present in the hospital when she has PEG placed.  I also called and spoke with her daughter, Jodi Kaufman.  Jodi Kaufman expressed that understanding plan and requested update on potential time for PEG placement it when more information is available so she can come to the hospital per her mother's request.  Length of Stay: 4  Current Medications: Scheduled Meds:  . multivitamin with minerals  1 tablet Oral Daily  . pantoprazole  40 mg Oral Daily  . sodium chloride flush  3 mL Intravenous Q12H    Continuous Infusions: .  ceFAZolin (ANCEF) IV      PRN Meds: acetaminophen **OR** acetaminophen, antiseptic oral rinse, ondansetron **OR** ondansetron (ZOFRAN) IV, oxyCODONE, polyethylene glycol  Physical Exam         General: Alert, awake, anxious HEENT: No bruits, no goiter, no JVD Heart: Regular rate and rhythm. No murmur appreciated. Lungs: Good air movement, clear Abdomen: Soft, nontender, nondistended, positive bowel sounds.  Ext: No significant edema Skin: Warm and dry Neuro: Grossly intact,  nonfocal.  Vital Signs: BP 122/69 (BP Location: Left Arm)   Pulse 77   Temp 97.8 F (36.6 C) (Oral)   Resp 16   Ht 5\' 6"  (1.676 m)   Wt 47.5 kg   SpO2 99%   BMI 16.90 kg/m  SpO2: SpO2: 99 % O2 Device: O2 Device: Room Air O2 Flow Rate:    Intake/output summary:   Intake/Output Summary (Last 24 hours) at 04/15/2020 0809 Last data filed at 04/15/2020 0555 Gross per 24 hour  Intake 357 ml  Output 550 ml  Net -193 ml   LBM: Last BM Date: 04/11/20 Baseline  Weight: Weight: 47.5 kg Most recent weight: Weight: 47.5 kg       Palliative Assessment/Data:    Flowsheet Rows   Flowsheet Row Most Recent Value  Intake Tab   Referral Department Hospitalist  Unit at Time of Referral Oncology Unit  Palliative Care Primary Diagnosis Cancer  Date Notified 04/10/20  Palliative Care Type New Palliative care  Reason for referral Clarify Goals of Care  Date of Admission 04/10/20  Date first seen by Palliative Care 04/12/20  # of days Palliative referral response time 2 Day(s)  # of days IP prior to Palliative referral 0  Clinical Assessment   Palliative Performance Scale Score 50%  Psychosocial & Spiritual Assessment   Palliative Care Outcomes   Patient/Family meeting held? Yes  Who was at the meeting? patient      Patient Active Problem List   Diagnosis Date Noted  . Protein-calorie malnutrition, severe 04/11/2020  . Failure to thrive in adult 04/10/2020  . Dehydration   . Abnormal liver enzymes   . Metastatic breast cancer ([place]) 04/01/2020  . RUQ pain 11/09/2019  . Abnormal weight loss 11/09/2019  . Family history of colon cancer 08/16/2019  . Mild emphysema ([place]) 08/16/2019  . 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 Rheumatoid factor positive 07/06/2019  . Recurrent UTI 05/17/2019  . Hyperlipidemia 05/15/2018  . GAD (generalized anxiety disorder) 02/09/2018  . History of osteopenia 11/27/2015  . GERD (gastroesophageal reflux disease) 11/27/2015  . Vitamin D deficiency  11/06/2011  . Overactive bladder 10/12/2011  . History of right breast cancer 10/12/2011    Palliative Care Assessment & Plan   Patient Profile: 79 y.o. female  with past medical history of breast cancer, newly diagnosed metastatic breast cancer anxiety, hyperlipidemia, GERD, vitamin D deficiency admitted on 04/10/2020 with nausea, vomiting and poor oral intake.  She was recently admitted to [facility] in [place] and was discharged home a couple weeks ago.  She had been admitted there for new onset of back pain and found to have metastatic breast cancer and is status post radiation.  She moved to [place] to be closer to her daughter and establish with Dr. Lindi Adie with plan to start on Ibrance and letrozole.  Palliative consulted for goals of care.  Assessment: Patient Active Problem List   Diagnosis Date Noted  . Protein-calorie malnutrition, severe 04/11/2020  . Failure to thrive in adult 04/10/2020  . Dehydration   . Abnormal liver enzymes   . Metastatic breast cancer ([place]) 04/01/2020  . RUQ pain 11/09/2019  . Abnormal weight loss 11/09/2019  . Family history of colon cancer 08/16/2019  . Mild emphysema ([place]) 08/16/2019  . 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 Rheumatoid factor positive 07/06/2019  . Recurrent UTI 05/17/2019  . Hyperlipidemia 05/15/2018  . GAD (generalized anxiety disorder) 02/09/2018  . History of osteopenia 11/27/2015  . GERD (gastroesophageal reflux disease) 11/27/2015  . Vitamin D deficiency 11/06/2011  . Overactive bladder 10/12/2011  . History of right breast cancer 10/12/2011   Recommendations/Plan:  Full code/Full scope  She has been evaluated by oncology with recommendation for PEG placement.  This is tentatively scheduled for Monday.  She is NPO at midnight for PEG placement.  She is very anxious about being NPO for procedure.  We discussed reasoning why she cannot have intake prior to procedure and she understands this.  I did ask that  she be allowed to swish and spit as needed for mouth care.  She understands that she cannot swallow anything  that she uses for mouth care.  [name] is invested in plan to have PEG tube placed and to pursue continued chemotherapy for her metastatic disease.  At the same time, she is very anxious about everything going on and feels as though she is "losing control" as she is overwhelmed by multiple tests, medications, and restrictions to things she is allowed to do would have.  She does not carry a formal psychiatric diagnosis, however, her anxiety and frustration with her perception of loss of autonomy certainly impact her care and need to be taken into consideration.  She would like her daughters to be present at time of PEG tube placement.  I spoke with her daughter Jodi Kaufman and will see if we can find out in the AM if PEG is going to occur on Monday and tentative time if possible.  Goals of Care and Additional Recommendations:  Limitations on Scope of Treatment: Full Scope Treatment  Code Status:    Code Status Orders  (From admission, onward)         Start     Ordered   04/10/20 1743  Full code  Continuous        04/10/20 1744        Code Status History    This patient has a current code status but no historical code status.   Advance Care Planning Activity       Prognosis:   Unable to determine  Discharge Planning:  Home with Home Health most likely  Care plan was discussed with   Thank you for allowing the Palliative Medicine Team to assist in the care of this patient.   Time In: 1630 Time Out: 1700 Total Time 30 Prolonged Time Billed No      Greater than 50%  of this time was spent counseling and coordinating care related to the above assessment and plan.  Micheline Rough, MD  Please contact Palliative Medicine Team phone at [phone] for questions and concerns.

## 2020-04-15 NOTE — Care Management Important Message (Signed)
Important Message  Patient Details IM Letter given to the Patient. Name: Jodi Kaufman MRN: [id number] Date of Birth: July 18, 1940   Medicare Important Message Given:  Yes     Kerin Salen 04/15/2020, 10:41 AM

## 2020-04-16 DIAGNOSIS — Z431 Encounter for attention to gastrostomy: Secondary | ICD-10-CM

## 2020-04-16 LAB — CBC
HCT: 43.4 % (ref 36.0–46.0)
Hemoglobin: 14.6 g/dL (ref 12.0–15.0)
MCH: 30.3 pg (ref 26.0–34.0)
MCHC: 33.6 g/dL (ref 30.0–36.0)
MCV: 90 fL (ref 80.0–100.0)
Platelets: 227 10*3/uL (ref 150–400)
RBC: 4.82 MIL/uL (ref 3.87–5.11)
RDW: 16 % — ABNORMAL HIGH (ref 11.5–15.5)
WBC: 7.7 10*3/uL (ref 4.0–10.5)
nRBC: 0 % (ref 0.0–0.2)

## 2020-04-16 LAB — COMPREHENSIVE METABOLIC PANEL
ALT: 37 U/L (ref 0–44)
AST: 22 U/L (ref 15–41)
Albumin: 2.4 g/dL — ABNORMAL LOW (ref 3.5–5.0)
Alkaline Phosphatase: 504 U/L — ABNORMAL HIGH (ref 38–126)
Anion gap: 10 (ref 5–15)
BUN: 7 mg/dL — ABNORMAL LOW (ref 8–23)
CO2: 25 mmol/L (ref 22–32)
Calcium: 8.3 mg/dL — ABNORMAL LOW (ref 8.9–10.3)
Chloride: 101 mmol/L (ref 98–111)
Creatinine, Ser: 0.37 mg/dL — ABNORMAL LOW (ref 0.44–1.00)
GFR, Estimated: >60 mL/min (ref >60)
Glucose, Bld: 80 mg/dL (ref 70–99)
Potassium: 3.1 mmol/L — ABNORMAL LOW (ref 3.5–5.1)
Sodium: 136 mmol/L (ref 135–145)
Total Bilirubin: 1.2 mg/dL (ref 0.3–1.2)
Total Protein: 4.5 g/dL — ABNORMAL LOW (ref 6.5–8.1)

## 2020-04-16 LAB — MAGNESIUM
Magnesium: 1.7 mg/dL (ref 1.7–2.4)
Magnesium: 1.6 mg/dL — ABNORMAL LOW (ref 1.7–2.4)

## 2020-04-16 LAB — GLUCOSE, CAPILLARY
Glucose-Capillary: 75 mg/dL (ref 70–99)
Glucose-Capillary: 70 mg/dL (ref 70–99)
Glucose-Capillary: 131 mg/dL — ABNORMAL HIGH (ref 70–99)

## 2020-04-16 LAB — PHOSPHORUS
Phosphorus: 2.8 mg/dL (ref 2.5–4.6)
Phosphorus: 2.5 mg/dL (ref 2.5–4.6)

## 2020-04-16 MED ORDER — POTASSIUM CHLORIDE 20 MEQ PO PACK
40.0000 meq | PACK | Freq: Once | ORAL | Status: AC
  Administered 2020-04-16: 40 meq
  Filled 2020-04-16: qty 2

## 2020-04-16 MED ORDER — POTASSIUM CHLORIDE 10 MEQ/100ML IV SOLN
10.0000 meq | INTRAVENOUS | Status: DC
  Filled 2020-04-16: qty 100

## 2020-04-16 MED ORDER — OSMOLITE 1.5 CAL PO LIQD
1000.0000 mL | ORAL | Status: AC
  Administered 2020-04-16 – 2020-04-18 (×2): 1000 mL
  Filled 2020-04-16 (×3): qty 1000

## 2020-04-16 NOTE — Progress Notes (Incomplete)
This RN began tube feed on patient 61mL per hour, patient as has

## 2020-04-16 NOTE — TOC Initial Note (Signed)
Transition of Care [facility]) - Initial/Assessment Note    Patient Details  Name: Jodi Kaufman MRN: [id number] Date of Birth: 10-12-1940  Transition of Care [facility] CM/SW Contact:    Lynnell Catalan, RN Phone Number: 04/16/2020, 3:46 PM  Pt to dc on new tube feeds. Spoke with daughter Arville Go to offer choice for [facility] services. [facility]. Pam from [place] to provide tube feeds and do teaching with peg. TOC will continue to follow.   Expected Discharge Plan: [place] Barriers to Discharge: Continued Medical Work up   Patient Goals and CMS Choice Patient states their goals for this hospitalization and ongoing recovery are:: To go home with daughter CMS Medicare.gov Compare Post Acute Care list provided to:: Patient Represenative (must comment) (Daughter Arville Go) Choice offered to / list presented to : Adult Children  Expected Discharge Plan and Services Expected Discharge Plan: [place]   Discharge Planning Services: CM Consult Post Acute Care Choice: [place] arrangements for the past 2 months: [place]                 DME Arranged: Tube feeding DME Agency: Other - Comment [facility]) Date DME Agency Contacted: 04/16/20 Time DME Agency Contacted: [phone] Representative spoke with at DME Agency: [place]: RN,PT [place] Agency: [place] Date [place]: 04/16/20 Time [place]: 3810 Representative spoke with at [place]: [place] Arrangements/Services Living arrangements for the past 2 months: [place] Lives with:: Adult Children Patient language and need for interpreter reviewed:: Yes Do you feel safe going back to the place where you live?: Yes      Need for Family Participation in Patient Care: Yes (Comment) Care giver support system in place?: Yes (comment) Current home services: Home RN,Home PT Criminal Activity/Legal Involvement Pertinent to Current  Situation/Hospitalization: No - Comment as needed  Activities of Daily Living Home Assistive Devices/Equipment: None ADL Screening (condition at time of admission) Patient's cognitive ability adequate to safely complete daily activities?: Yes Is the patient deaf or have difficulty hearing?: No Does the patient have difficulty seeing, even when wearing glasses/contacts?: No Does the patient have difficulty concentrating, remembering, or making decisions?: No Patient able to express need for assistance with ADLs?: Yes Does the patient have difficulty dressing or bathing?: Yes Independently performs ADLs?: No Communication: Independent Dressing (OT): Needs assistance Is this a change from baseline?: Pre-admission baseline Grooming: Needs assistance Is this a change from baseline?: Pre-admission baseline Feeding: Needs assistance Is this a change from baseline?: Pre-admission baseline Bathing: Needs assistance Is this a change from baseline?: Pre-admission baseline Toileting: Needs assistance Is this a change from baseline?: Pre-admission baseline In/Out Bed: Needs assistance Is this a change from baseline?: Pre-admission baseline Walks in Home: Needs assistance Is this a change from baseline?: Pre-admission baseline Does the patient have difficulty walking or climbing stairs?: Yes Weakness of Legs: Both Weakness of Arms/Hands: Both  Permission Sought/Granted Permission sought to share information with : Chartered certified accountant granted to share information with : Yes, Verbal Permission Granted     Permission granted to share info w AGENCY: [place] [facility]        Emotional Assessment Appearance:: Appears stated age Attitude/Demeanor/Rapport: Gracious Affect (typically observed): Calm Orientation: : Oriented to Self,Oriented to Place,Oriented to  Time,Oriented to Situation      Admission diagnosis:  Dehydration [E86.0] Failure to thrive in adult  [R62.7] Abnormal liver enzymes [R74.8] Patient  Active Problem List   Diagnosis Date Noted  . Encounter for PEG (percutaneous endoscopic gastrostomy) ([place])   . Protein-calorie malnutrition, severe 04/11/2020  . Failure to thrive in adult 04/10/2020  . Dehydration   . Abnormal liver enzymes   . Metastatic breast cancer ([place]) 04/01/2020  . RUQ pain 11/09/2019  . Abnormal weight loss 11/09/2019  . Family history of colon cancer 08/16/2019  . Mild emphysema ([place]) 08/16/2019  . 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 Rheumatoid factor positive 07/06/2019  . Recurrent UTI 05/17/2019  . Hyperlipidemia 05/15/2018  . GAD (generalized anxiety disorder) 02/09/2018  . History of osteopenia 11/27/2015  . GERD (gastroesophageal reflux disease) 11/27/2015  . Vitamin D deficiency 11/06/2011  . Overactive bladder 10/12/2011  . History of right breast cancer 10/12/2011   PCP:  Hali Marry, MD Pharmacy:   [id number] - [place], [place] - [address] [place] [address] Phone: [phone] Fax: [phone]     Social Determinants of Health (SDOH) Interventions    Readmission Risk Interventions No flowsheet data found.

## 2020-04-16 NOTE — Progress Notes (Signed)
PROGRESS NOTE    [place]  OAC:[id number] DOB: 1940/09/13 DOA: 04/10/2020 PCP: Hali Marry, MD   Brief Narrative:Jodi Kaufman is a 79 year old female with past medical history significant for breast cancer, newly diagnosed metastatic breast cancer, generalized anxiety disorder, hyperlipidemia, GERD, vitamin D deficiency who presented to the hospital with complaints of nausea, vomiting and poor oral intake.  Patient was recently admitted to [facility] in [place] and was discharged home several weeks ago.  At that time, patient had complained of back and hip pain and was found to have metastatic breast cancer with multiple bony lesions.  She received radiation well inpatient.  She has since followed up with Dr. Lindi Adie from oncology and was started on Ibrance with letrozole.  Patient met with oncology, was recommended for PEG placement.  Assessment & Plan:   Active Problems:   Failure to thrive in adult   Protein-calorie malnutrition, severe    Failure to thrive with metastatic breast cancer-s/p PEG tube today.  Starting tube feeds today.  Hypernatremia-resolved sodium 136 status post IV fluids.  AKI -Resolved, creatinine back to baseline.  Creatinine 0.37 today  Right upper quadrant abdominal pain with elevated liver enzyme-lfts trending down. -Right upper quadrant ultrasound gallbladder sludge without evidence of cholecystitis  Anxiety -Ativan PRN   Goals of care -Appreciate palliative care medicine involvement  HYPOKALEMIA-potassium 3.1 replete and recheck in a.m. check magnesium.    Nutrition Problem: Severe Malnutrition Etiology: chronic illness,cancer and cancer related treatments     Signs/Symptoms: percent weight loss,energy intake < or equal to 75% for > or equal to 1 month,severe fat depletion,severe muscle depletion Percent weight loss: 21 % (x 11 months)    Interventions: Refer to RD note for recommendations,Snacks,MVI  Estimated  body mass index is 16.9 kg/m as calculated from the following:   Height as of this encounter: $RemoveBeforeD'5\' 6"'HaPvIVaFJeUlQg$  (1.676 m).   Weight as of this encounter: 47.5 kg.  DVT prophylaxis: SCD LOVENOX ON HOLD FOR PEG IN AM Code Status: Full code Family Communication: None at bedside Disposition Plan:  Patient had a PEG tube placed 04/15/2020 starting tube feeds today.  Once she is able to tolerate the tube feeds consider discharge.  I have reconsulted physical therapy. Status is: Inpatient  Remains inpatient appropriate because:Ongoing diagnostic testing needed not appropriate for outpatient work up, IV treatments appropriate due to intensity of illness or inability to take PO and Inpatient level of care appropriate due to severity of illness   Dispo: The patient is from: Home  Anticipated d/c is to: Home  Anticipated d/c date is: 2 days  Patient currently is not medically stable to d/c.  Likely PEG tube placement IN AM  Consultants:   Oncology  Palliative care medicine   Procedures:   None   Antimicrobials:NONE  Subjective: She is still very anxious Complaining of pain at the site of PEG tube which is expected Had bowel movements overnight no diarrhea or vomiting Objective: Vitals:   04/15/20 1545 04/15/20 2156 04/16/20 0611 04/16/20 1201  BP: (!) 113/94 117/65 120/76 121/71  Pulse: 80 79 82 97  Resp: $Remo'16 16 16 18  'pGvLJ$ Temp: 97.9 F (36.6 C) 97.9 F (36.6 C) (!) 97.5 F (36.4 C) 98 F (36.7 C)  TempSrc: Oral Oral Oral Oral  SpO2: 96% 99% 96% 94%  Weight:      Height:        Intake/Output Summary (Last 24 hours) at 04/16/2020 1344 Last data filed at 04/15/2020 1905 Gross per 24  hour  Intake 200 ml  Output --  Net 200 ml   Filed Weights   04/10/20 1946  Weight: 47.5 kg    Examination:  General exam: Appears anxious Respiratory system: Clear to auscultation. Respiratory effort normal. Cardiovascular system: S1 & S2 heard, RRR. No  JVD, murmurs, rubs, gallops or clicks. No pedal edema. Gastrointestinal system: Abdomen is nondistended, soft and nontender. No organomegaly or masses felt. Normal bowel sounds heard.  PEG tube in place Central nervous system: Alert and oriented. No focal neurological deficits. Extremities: Symmetric 5 x 5 power. Skin: No rashes, lesions or ulcers Psychiatry: Judgement and insight appear normal. Mood & affect appropriate.     Data Reviewed: I have personally reviewed following labs and imaging studies  CBC: Recent Labs  Lab 04/10/20 1259 04/11/20 0549 04/16/20 0535  WBC 6.4 4.8 7.7  NEUTROABS 5.5  --   --   HGB 19.7* 15.4* 14.6  HCT 60.3* 46.3* 43.4  MCV 92.1 90.6 90.0  PLT 279 230 341   Basic Metabolic Panel: Recent Labs  Lab 04/11/20 0549 04/12/20 0621 04/13/20 1106 04/14/20 0615 04/16/20 0535  NA 145 147* 143 140 136  K 3.8 3.2* 4.0 3.9 3.1*  CL 107 109 109 106 101  CO2 $Re'28 28 26 26 25  'Dep$ GLUCOSE 150* 113* 115* 100* 80  BUN 36* $Remov'22 14 11 'tzElbE$ 7*  CREATININE 0.62 0.58 0.50 0.57 0.37*  CALCIUM 9.1 9.2 8.6* 8.4* 8.3*  MG  --   --  1.9  --  1.7  PHOS  --   --   --   --  2.8   GFR: Estimated Creatinine Clearance: 43.5 mL/min (A) (by C-G formula based on SCr of 0.37 mg/dL (L)). Liver Function Tests: Recent Labs  Lab 04/11/20 0549 04/12/20 0621 04/13/20 1106 04/14/20 0615 04/16/20 0535  AST 44* 89* 71* 46* 22  ALT 60* 97* 106* 79* 37  ALKPHOS 459* 476* 493* 557* 504*  BILITOT 1.4* 1.3* 1.1 1.0 1.2  PROT 5.1* 4.7* 4.7* 4.8* 4.5*  ALBUMIN 2.7* 2.6* 2.6* 2.5* 2.4*   No results for input(s): LIPASE, AMYLASE in the last 168 hours. No results for input(s): AMMONIA in the last 168 hours. Coagulation Profile: Recent Labs  Lab 04/13/20 0816  INR 1.3*   Cardiac Enzymes: No results for input(s): CKTOTAL, CKMB, CKMBINDEX, TROPONINI in the last 168 hours. BNP (last 3 results) No results for input(s): PROBNP in the last 8760 hours. HbA1C: No results for input(s): HGBA1C  in the last 72 hours. CBG: Recent Labs  Lab 04/16/20 1230  GLUCAP 75   Lipid Profile: No results for input(s): CHOL, HDL, LDLCALC, TRIG, CHOLHDL, LDLDIRECT in the last 72 hours. Thyroid Function Tests: No results for input(s): TSH, T4TOTAL, FREET4, T3FREE, THYROIDAB in the last 72 hours. Anemia Panel: No results for input(s): VITAMINB12, FOLATE, FERRITIN, TIBC, IRON, RETICCTPCT in the last 72 hours. Sepsis Labs: Recent Labs  Lab 04/10/20 1259 04/10/20 1817 04/10/20 2050  LATICACIDVEN 3.6* 2.2* 1.8    Recent Results (from the past 240 hour(s))  Resp Panel by RT-PCR (Flu A&B, Covid) Nasopharyngeal Swab     Status: None   Collection Time: 04/10/20  2:36 PM   Specimen: Nasopharyngeal Swab; Nasopharyngeal(NP) swabs in vial transport medium  Result Value Ref Range Status   SARS Coronavirus 2 by RT PCR NEGATIVE NEGATIVE Final    Comment: (NOTE) SARS-CoV-2 target nucleic acids are NOT DETECTED.  The SARS-CoV-2 RNA is generally detectable in upper respiratory specimens during the acute  phase of infection. The lowest concentration of SARS-CoV-2 viral copies this assay can detect is 138 copies/mL. A negative result does not preclude SARS-Cov-2 infection and should not be used as the sole basis for treatment or other patient management decisions. A negative result may occur with  improper specimen collection/handling, submission of specimen other than nasopharyngeal swab, presence of viral mutation(s) within the areas targeted by this assay, and inadequate number of viral copies(<138 copies/mL). A negative result must be combined with clinical observations, patient history, and epidemiological information. The expected result is Negative.  Fact Sheet for Patients:  EntrepreneurPulse.com.au  Fact Sheet for Healthcare Providers:  IncredibleEmployment.be  This test is no t yet approved or cleared by the [place] FDA and  has been authorized  for detection and/or diagnosis of SARS-CoV-2 by FDA under an Emergency Use Authorization (EUA). This EUA will remain  in effect (meaning this test can be used) for the duration of the COVID-19 declaration under Section 564(b)(1) of the Act, 21 U.S.C.section 360bbb-3(b)(1), unless the authorization is terminated  or revoked sooner.       Influenza A by PCR NEGATIVE NEGATIVE Final   Influenza B by PCR NEGATIVE NEGATIVE Final    Comment: (NOTE) The Xpert Xpress SARS-CoV-2/FLU/RSV plus assay is intended as an aid in the diagnosis of influenza from Nasopharyngeal swab specimens and should not be used as a sole basis for treatment. Nasal washings and aspirates are unacceptable for Xpert Xpress SARS-CoV-2/FLU/RSV testing.  Fact Sheet for Patients: EntrepreneurPulse.com.au  Fact Sheet for Healthcare Providers: IncredibleEmployment.be  This test is not yet approved or cleared by the [place] FDA and has been authorized for detection and/or diagnosis of SARS-CoV-2 by FDA under an Emergency Use Authorization (EUA). This EUA will remain in effect (meaning this test can be used) for the duration of the COVID-19 declaration under Section 564(b)(1) of the Act, 21 U.S.C. section 360bbb-3(b)(1), unless the authorization is terminated or revoked.  Performed at [facility], [place] [address], [place], [place] [address]          Radiology Studies: IR GASTROSTOMY TUBE MOD SED  Result Date: 04/15/2020 INDICATION: Failure to thrive. Please perform percutaneous gastrostomy tube placement for enteric nutrition supplementation purposes. EXAM: PULL TROUGH GASTROSTOMY TUBE PLACEMENT COMPARISON:  Abdominal CT-04/12/2020; esophagram-04/13/2019 MEDICATIONS: Ancef 2 gm IV; Antibiotics were administered within 1 hour of the procedure. Glucagon 1 mg IV CONTRAST:  20 mL of Omnipaque 300 administered into the gastric lumen. ANESTHESIA/SEDATION:  Moderate (conscious) sedation was employed during this procedure. A total of Versed 3.5 mg and Fentanyl 100 mcg was administered intravenously. Moderate Sedation Time: 15 minutes. The patient's level of consciousness and vital signs were monitored continuously by radiology nursing throughout the procedure under my direct supervision. FLUOROSCOPY TIME:  6 minutes, 42 seconds (60 mGy) COMPLICATIONS: None immediate. PROCEDURE: Informed written consent was obtained from the patient following explanation of the procedure, risks, benefits and alternatives. A time out was performed prior to the initiation of the procedure. Ultrasound scanning was performed to demarcate the edge of the left lobe of the liver. Maximal barrier sterile technique utilized including caps, mask, sterile gowns, sterile gloves, large sterile drape, hand hygiene and Betadine prep. The left upper quadrant was sterilely prepped and draped. An oral gastric catheter was inserted into the stomach under fluoroscopy. The existing nasogastric feeding tube was removed. The left costal margin and the air and barium opacified transverse colon were identified and avoided. Air was injected into the stomach for insufflation  and visualization under fluoroscopy. Under sterile conditions a 17 gauge trocar needle was utilized to access the stomach percutaneously beneath the left subcostal margin after the overlying soft tissues were anesthetized with 1% Lidocaine with epinephrine. Needle position was confirmed within the stomach with aspiration of air and injection of small amount of contrast. A single T tack was deployed for gastropexy. Over an Amplatz guide wire, a 9-French sheath was inserted into the stomach. A snare device was utilized to capture the oral gastric catheter. The snare device was pulled retrograde from the stomach up the esophagus and out the oropharynx. The 20-French pull-through gastrostomy was connected to the snare device and pulled antegrade  through the oropharynx down the esophagus into the stomach and then through the percutaneous tract external to the patient. The gastrostomy was assembled externally. Contrast injection confirms position in the stomach. Several spot radiographic images were obtained in various obliquities for documentation. The patient tolerated procedure well without immediate post procedural complication. FINDINGS: After successful fluoroscopic guided placement, the gastrostomy tube is appropriately positioned with internal disc against the ventral aspect of the gastric lumen. IMPRESSION: Successful fluoroscopic insertion of a 20-French pull-through gastrostomy tube. The gastrostomy may be used immediately for medication administration and in 24 hrs for the initiation of feeds. Electronically Signed   By: Sandi Mariscal M.D.   On: 04/15/2020 12:11        Scheduled Meds: . enoxaparin (LOVENOX) injection  40 mg Subcutaneous Q24H  . multivitamin with minerals  1 tablet Per Tube Daily  . pantoprazole sodium  40 mg Per Tube Daily  . sodium chloride flush  3 mL Intravenous Q12H   Continuous Infusions: . feeding supplement (OSMOLITE 1.5 CAL)       LOS: 5 days    Georgette Shell, MD 04/16/2020, 1:44 PM

## 2020-04-16 NOTE — Progress Notes (Signed)
Referring Physician(s): Choi,J  Supervising Physician: Markus Daft  Patient Status:  [facility] - In-pt  Chief Complaint:  Dysphagia/malnutrition  Subjective: Patient resting comfortably in bed, did have moderate abdominal pain post gastrostomy tube placement yesterday evening, however pain has improved this morning.   Allergies: Atorvastatin, Clavulanic acid, and Tamoxifen  Medications: Prior to Admission medications   Medication Sig Start Date End Date Taking? Authorizing Provider  Calcium Citrate (CITRACAL PO) Take 1 tablet by mouth in the morning, at noon, in the evening, and at bedtime.   Yes [provider]  letrozole (FEMARA) 2.5 MG tablet Take 1 tablet (2.5 mg total) by mouth daily. 04/01/20  Yes Nicholas Lose, MD  LORazepam (ATIVAN) 1 MG tablet Take 0.5 mg by mouth at bedtime.   Yes [provider]  omeprazole (PRILOSEC) 20 MG capsule Take 20 mg by mouth daily.   Yes [provider]  ondansetron (ZOFRAN ODT) 4 MG disintegrating tablet Take 1 tablet (4 mg total) by mouth every 8 (eight) hours as needed for nausea or vomiting. 04/01/20  Yes Nicholas Lose, MD  oxyCODONE (OXY IR/ROXICODONE) 5 MG immediate release tablet Take 2.5 mg by mouth every 4 (four) hours as needed for severe pain.   Yes [provider]  PSE-DM-APAP & PSE-DM-CPM-APAP (TYLENOL COLD & FLU DAY/NIGHT) 30-15-500 & 30- 15-2-500MG  TBPK Take 5 mLs by mouth 2 (two) times daily as needed (pain).   Yes [provider]  palbociclib (IBRANCE) 100 MG tablet Take 1 tablet (100 mg total) by mouth daily. Take for 21 days on, 7 days off, repeat every 28 days. Patient not taking: No sig reported 04/02/20   Nicholas Lose, MD     Vital Signs: BP 121/71 (BP Location: Left Arm)   Pulse 97   Temp 98 F (36.7 C) (Oral)   Resp 18   Ht 5\' 6"  (1.676 m)   Wt 104 lb 11.5 oz (47.5 kg)   SpO2 94%   BMI 16.90 kg/m   Physical Exam awake, alert.  Gastrostomy tube intact, insertion site  okay, mildly tender.  Dressing clean and dry, no evidence of leakage.  Abdomen soft, nondistended.  Imaging: CT ABDOMEN WO CONTRAST  Result Date: 04/12/2020 CLINICAL DATA:  79 year old female referred for a workup of possible percutaneous gastrostomy. Known malignancy with metastases. EXAM: CT ABDOMEN WITHOUT CONTRAST TECHNIQUE: Multidetector CT imaging of the abdomen was performed following the standard protocol without IV contrast. COMPARISON:  None. FINDINGS: Lower chest: Expansile lesion of the inferior right eighth rib on image 17 of series 4. Right ninth rib lesion on image 45 of series 4. Left sixth rib lesion incompletely imaged on image 2 of series 4. Atelectatic changes at the lung bases.  Coronary calcifications. Hepatobiliary: Low-density lesion within the medial aspect of segment 4 B of the liver, poorly characterized on noncontrast CT. Unremarkable gallbladder. Pancreas: Unremarkable Spleen: Unremarkable Adrenals/Urinary Tract: Unremarkable adrenal glands. Unremarkable kidneys. Stomach/Bowel: Stomach decompressed. No inflammatory changes. Unremarkable visualized small bowel without abnormal distention. Visualized colon unremarkable. Vascular/Lymphatic: Atherosclerotic calcifications of the abdominal aorta and proximal iliac arteries. Other: None Musculoskeletal: Lytic lesion within the right iliac crest. Destructive bone lesion within the left L4 lamina. Lytic lesion within the L3 spinous process. Destructive lytic lesion within the left aspect of the L3 vertebral body. Destructive lytic lesion within the left aspect of the L2 vertebral body. Sclerotic lesion at the anterior inferior L1 vertebral body. Mixed sclerotic and lucent lesion in the right aspect of T12 vertebral body. Destructive  lesion with lucent and sclerotic features within the T11 vertebral body. No bony canal narrowing. IMPRESSION: Bony metastatic disease involving the axial and appendicular skeleton, which is compatible with the  given history of known malignancy and metastases, as above. Low-density lesion of segment 4B in the liver, concerning for liver metastases though incompletely characterized on the noncontrast CT. Electronically Signed   By: Corrie Mckusick D.O.   On: 04/12/2020 15:12   IR GASTROSTOMY TUBE MOD SED  Result Date: 04/15/2020 INDICATION: Failure to thrive. Please perform percutaneous gastrostomy tube placement for enteric nutrition supplementation purposes. EXAM: PULL TROUGH GASTROSTOMY TUBE PLACEMENT COMPARISON:  Abdominal CT-04/12/2020; esophagram-04/13/2019 MEDICATIONS: Ancef 2 gm IV; Antibiotics were administered within 1 hour of the procedure. Glucagon 1 mg IV CONTRAST:  20 mL of Omnipaque 300 administered into the gastric lumen. ANESTHESIA/SEDATION: Moderate (conscious) sedation was employed during this procedure. A total of Versed 3.5 mg and Fentanyl 100 mcg was administered intravenously. Moderate Sedation Time: 15 minutes. The patient's level of consciousness and vital signs were monitored continuously by radiology nursing throughout the procedure under my direct supervision. FLUOROSCOPY TIME:  6 minutes, 42 seconds (60 mGy) COMPLICATIONS: None immediate. PROCEDURE: Informed written consent was obtained from the patient following explanation of the procedure, risks, benefits and alternatives. A time out was performed prior to the initiation of the procedure. Ultrasound scanning was performed to demarcate the edge of the left lobe of the liver. Maximal barrier sterile technique utilized including caps, mask, sterile gowns, sterile gloves, large sterile drape, hand hygiene and Betadine prep. The left upper quadrant was sterilely prepped and draped. An oral gastric catheter was inserted into the stomach under fluoroscopy. The existing nasogastric feeding tube was removed. The left costal margin and the air and barium opacified transverse colon were identified and avoided. Air was injected into the stomach for  insufflation and visualization under fluoroscopy. Under sterile conditions a 17 gauge trocar needle was utilized to access the stomach percutaneously beneath the left subcostal margin after the overlying soft tissues were anesthetized with 1% Lidocaine with epinephrine. Needle position was confirmed within the stomach with aspiration of air and injection of small amount of contrast. A single T tack was deployed for gastropexy. Over an Amplatz guide wire, a 9-French sheath was inserted into the stomach. A snare device was utilized to capture the oral gastric catheter. The snare device was pulled retrograde from the stomach up the esophagus and out the oropharynx. The 20-French pull-through gastrostomy was connected to the snare device and pulled antegrade through the oropharynx down the esophagus into the stomach and then through the percutaneous tract external to the patient. The gastrostomy was assembled externally. Contrast injection confirms position in the stomach. Several spot radiographic images were obtained in various obliquities for documentation. The patient tolerated procedure well without immediate post procedural complication. FINDINGS: After successful fluoroscopic guided placement, the gastrostomy tube is appropriately positioned with internal disc against the ventral aspect of the gastric lumen. IMPRESSION: Successful fluoroscopic insertion of a 20-French pull-through gastrostomy tube. The gastrostomy may be used immediately for medication administration and in 24 hrs for the initiation of feeds. Electronically Signed   By: Sandi Mariscal M.D.   On: 04/15/2020 12:11   DG ESOPHAGUS W SINGLE CM (SOL OR THIN BA)  Result Date: 04/12/2020 CLINICAL DATA:  Dysphagia, unspecified. EXAM: ESOPHOGRAM/BARIUM SWALLOW TECHNIQUE: Single contrast examination was performed using thin barium or water soluble. FLUOROSCOPY TIME:  Fluoroscopy Time:  1 minutes, 12 seconds. Radiation Exposure Index (if provided by  the  fluoroscopic device): 2.6 mGy Number of Acquired Spot Images: 0 COMPARISON:  Same day CT abdomen 04/12/2020. FINDINGS: Significantly limited examination due to the patient's limited ability to reposition and inability to drink at a rate sufficient to fully distend the esophagus. Additionally, the examination was prematurely terminated as the patient became sick and began to vomit. There was prompt passage of contrast through the esophagus and into the proximal stomach. Within the described limitations, the esophagus is smooth in contour. The distal esophagus/GE junction was not observed to fully distend. However, this may be due to the patient's inability to drink at a sufficient rate. No evidence of hiatal hernia. No gastroesophageal reflux was observed. IMPRESSION: Significantly limited and prematurely terminated examination, as described. Prompt passage of contrast through the esophagus and into the proximal stomach. The distal esophagus/GE junction was not observed to fully distend. However, this may be due to the patient's inability to drink at a sufficient rate. No evidence of hiatal hernia. No gastroesophageal reflux was observed. Electronically Signed   By: Kellie Simmering DO   On: 04/12/2020 15:42    Labs:  CBC: Recent Labs    04/01/20 1651 04/10/20 1259 04/11/20 0549 04/16/20 0535  WBC 9.5 6.4 4.8 7.7  HGB 17.9* 19.7* 15.4* 14.6  HCT 53.8* 60.3* 46.3* 43.4  PLT 125* 279 230 227    COAGS: Recent Labs    04/13/20 0816  INR 1.3*    BMP: Recent Labs    11/09/19 1407 04/01/20 1651 04/12/20 0621 04/13/20 1106 04/14/20 0615 04/16/20 0535  NA 141   < > 147* 143 140 136  K 4.3   < > 3.2* 4.0 3.9 3.1*  CL 103   < > 109 109 106 101  CO2 29   < > 28 26 26 25   GLUCOSE 100*   < > 113* 115* 100* 80  BUN 25   < > 22 14 11  7*  CALCIUM 10.3   < > 9.2 8.6* 8.4* 8.3*  CREATININE 0.88   < > 0.58 0.50 0.57 0.37*  GFRNONAA 63   < > >60 >60 >60 >60  GFRAA 73  --   --   --   --   --    < >  = values in this interval not displayed.    LIVER FUNCTION TESTS: Recent Labs    04/12/20 0621 04/13/20 1106 04/14/20 0615 04/16/20 0535  BILITOT 1.3* 1.1 1.0 1.2  AST 89* 71* 46* 22  ALT 97* 106* 79* 37  ALKPHOS 476* 493* 557* 504*  PROT 4.7* 4.7* 4.8* 4.5*  ALBUMIN 2.6* 2.6* 2.5* 2.4*    Assessment and Plan: Patient with history of metastatic breast cancer, dysphagia, malnutrition/failure to thrive; status post gastrostomy tube placement on 12/20; afebrile, WBC normal, hemoglobin normal, potassium 3.1-replace; okay to use gastrostomy tube as needed   Electronically Signed: D. Rowe Robert, PA-C 04/16/2020, 2:42 PM   I spent a total of 15 minutes at the the patient's bedside AND on the patient's hospital floor or unit, greater than 50% of which was counseling/coordinating care for gastrostomy tube    Patient ID: Jodi Kaufman, female   DOB: Sep 15, 1940, 79 y.o.   MRN: [id number]

## 2020-04-16 NOTE — Progress Notes (Signed)
  Speech Language Pathology Treatment: Dysphagia  Patient Details Name: Jodi Kaufman MRN: [id number] DOB: 1941/02/28 Today's Date: 04/16/2020 Time: 6546-5035 SLP Time Calculation (min) (ACUTE ONLY): 16 min  Assessment / Plan / Recommendation Clinical Impression  Pt reports severe dry mouth - and states she has not consumed anything since her procedure.  Advised her that per orders, she is on clear liquid diet- but would check with RN. Pt observed with intake of Pedialyte as she states she can swallow this without deficits. SLP observed pt swallowing Pedialyte without indications of dysphagia.   After SLP offered options for po, she declined to consume anything else befoer she had her medication to coat her stomach. .  She also verbalizes that sometimes she can manage the Boost (one in her room from home) if it is watered down.  Phlegm and gustatory changes are largest barriers to po gleaned from conversation with pt.  Poor dentition is also an issue but chronic not acute.  SLP advised pt to assure she is consuming intake as much as able to prevent disuse muscle atrophy negatively impacting her swallow musculature. Further advised that milk products may increase her secretions and for her to try warm water before eating to potentially help loosen secretions to allow her to cough and clear. Pt reports salt water gargle makes her secretions worse. She prefers to take her medications with coffee.  When provided options, pt advised she would prefer dys3/thin to allow her food choices that she may tolerate.   SLP to sign off as all education for dysphagia mitigation has been completed using teach back.  Thanks.    HPI HPI: 79yo female admitted 04/10/20 with xerostomia, N/V and poor po intake, recently dx with metastatic breast cancer, with mets to ribs, spine, pelvis, s/p treatment for spinal mets. PMH: anxiety, HLD, GERD, vitamin D deficiency. Pt is being followed by Palliative medicine; PEG 12/20 to  supplement nutrition.      SLP Plan  All goals met       Recommendations  Diet recommendations: Dysphagia 3 (mechanical soft);Thin liquid Liquids provided via: Cup;Straw Medication Administration: Other (Comment) (per pt tolerance) Compensations: Minimize environmental distractions;Slow rate;Small sips/bites Postural Changes and/or Swallow Maneuvers: Seated upright 90 degrees;Upright 30-60 min after meal                Oral Care Recommendations: Oral care BID Follow up Recommendations: None SLP Visit Diagnosis: Dysphagia, unspecified (R13.10) Plan: All goals met       GO              [name], MS [facility] SLP Acute Rehab Services Office [phone] Pager [phone]   Jodi Kaufman 04/16/2020, 10:22 AM

## 2020-04-16 NOTE — Progress Notes (Signed)
Physical Therapy Treatment Patient Details Name: Jodi Kaufman MRN: [id number] DOB: 1940/08/06 Today's Date: 04/16/2020    History of Present Illness 79 y.o. female with medical history significant of metastatic breast cancer, vitamin D deficiency, mild emphysema, generalized anxiety disorder, hyperlipidemia and GERD came to [facility] ED with complaint of nausea, vomiting and poor p.o. intake. Patient was discharged several weeks ago from [facility] in [place]. Pt with metastatic breast cancer, with mets to ribs, spine, pelvis, s/p treatment for spinal mets.    PT Comments    General Comments: AxO x 3 very anxious, slightly OCD, easily upset/frustrated but through all that very pleasant, educated [place] to amb around the room.  General bed mobility comments: self able with increased time.  General transfer comment: for safety pt is weak required sit to stand from elevated bed and VC's on controlled desend.  Mild anxiety/fear of falling and easily upset and concerned about her weakness. General Gait Details: amb around room pt declines to amb in hallway.  Required increased time and patients for her to mind set to task.  "I want to do this",  "I have to get up and move more".  Pt tolerated distance with several standing rest breaks.  "I'm not tired just weak". Assisted back to EOB.  Rest break then performed several sit to stands.  VC's to "slow" sit to stand with more control.  "will someone be back later today to walk again?".  I replied no but educated her to NOT have staff use perwick during day and instructed pt to ask the staff to "walk me to the bathroom" with walker.  Several trips to the bathroom will increase her activity level and mobility level.  Will continue to follow her during her Acute stay.     Follow Up Recommendations  Home health PT;Supervision/Assistance - 24 hour     Equipment Recommendations  Rolling walker with 5" wheels;3in1 (PT)    Recommendations for  Other Services       Precautions / Restrictions Precautions Precautions: Fall Precaution Comments: spinal mets, anxiety    Mobility  Bed Mobility Overal bed mobility: Needs Assistance Bed Mobility: Supine to Sit;Sit to Supine     Supine to sit: Supervision Sit to supine: Supervision   General bed mobility comments: self able with increased time  Transfers Overall transfer level: Needs assistance Equipment used: Rolling walker (2 wheeled) Transfers: Sit to/from [facility] Sit to Stand: Min guard Stand pivot transfers: Min guard       General transfer comment: for safety pt is weak required sit to stand from elevated bed and VC's on controlled desend.  Mild anxiety/fear of falling and easily upset and concerned about her weakness.  Ambulation/Gait Ambulation/Gait assistance: Min guard Gait Distance (Feet): 12 Feet Assistive device: Rolling walker (2 wheeled)   Gait velocity: decr   General Gait Details: amb around room pt declines to amb in hallway.  Required increased time and patients for her to mind set to task.  "I want to do this",  "I have to get up and move more".  Pt tolerated distance with several standing rest breaks.  "I'm not tired just weak".   Stairs             Wheelchair Mobility    Modified Rankin (Stroke Patients Only)       Balance  Cognition Arousal/Alertness: Awake/alert Behavior During Therapy: WFL for tasks assessed/performed;Anxious Overall Cognitive Status: Within Functional Limits for tasks assessed                                 General Comments: AxO x 3 very anxious, slightly OCD, easily upset/frustrated but through all that very pleasant, educated Psychiatric nurse Comments        Pertinent Vitals/Pain Pain Assessment: Faces Faces Pain Scale: Hurts a little bit Pain Location: B feet feel numb and ABD sore at  PEG site Pain Descriptors / Indicators: Aching Pain Intervention(s): Monitored during session    Home Living                      Prior Function            PT Goals (current goals can now be found in the care plan section) Progress towards PT goals: Progressing toward goals    Frequency    Min 3X/week      PT Plan Current plan remains appropriate    Co-evaluation              AM-PAC PT "6 Clicks" Mobility   Outcome Measure  Help needed turning from your back to your side while in a flat bed without using bedrails?: A Little Help needed moving from lying on your back to sitting on the side of a flat bed without using bedrails?: A Little Help needed moving to and from a bed to a chair (including a wheelchair)?: A Little Help needed standing up from a chair using your arms (e.g., wheelchair or bedside chair)?: A Little Help needed to walk in hospital room?: A Little Help needed climbing 3-5 steps with a railing? : A Lot 6 Click Score: 17    End of Session Equipment Utilized During Treatment: Gait belt Activity Tolerance: Patient tolerated treatment well Patient left: in bed;with bed alarm set;with call bell/phone within reach;with nursing/sitter in room Nurse Communication: Mobility status PT Visit Diagnosis: Other abnormalities of gait and mobility (R26.89)     Time: [id number] PT Time Calculation (min) (ACUTE ONLY): 25 min  Charges:  $Gait Training: 8-22 mins $Therapeutic Activity: 8-22 mins                     Rica Koyanagi  PTA Acute  Rehabilitation Services Pager      [phone] Office      [phone]

## 2020-04-16 NOTE — Progress Notes (Signed)
Nutrition Follow-up  DOCUMENTATION CODES:   Severe malnutrition in context of chronic illness,Underweight  INTERVENTION:   Monitor magnesium, potassium, and phosphorus daily for at least 3 days, MD to replete as needed, as pt is at risk for refeeding syndrome.  -Initiate Osmolite 1.5 @ 20 ml/hr via G-tube, advance by 10 ml every 12 hours to goal rate of 55 ml/hr. -At goal rate this will provide: 1980 kcals, 82g protein and 914 ml H2O.  -Multivitamin with minerals daily  Bolus recommendations: -237 ml (1 carton) Osmolite 1.5 five times daily -Free water flushes of 60 ml before and after each bolus -Additional water can be provided via PO -Provides 1775 kcals, 74g protein and 1505 ml H2O  NUTRITION DIAGNOSIS:   Severe Malnutrition related to chronic illness,cancer and cancer related treatments as evidenced by percent weight loss,energy intake < or equal to 75% for > or equal to 1 month,severe fat depletion,severe muscle depletion.  Ongoing.  GOAL:   Patient will meet greater than or equal to 90% of their needs  Not meeting yet.  MONITOR:   PO intake,Supplement acceptance,Labs,Weight trends,I & O's  REASON FOR ASSESSMENT:   Consult (via secure chat) Enteral/tube feeding initiation and management  ASSESSMENT:   79 year old female with past medical history significant for breast cancer, newly diagnosed metastatic breast cancer, generalized anxiety disorder, hyperlipidemia, GERD, vitamin D deficiency who presented to the hospital with complaints of nausea, vomiting and poor oral intake.  Patient was recently admitted to [facility] in [place] and was discharged home several weeks ago.  At that time, patient had complained of back and hip pain and was found to have metastatic breast cancer with multiple bony lesions.  She received radiation well inpatient.  She has since followed up with Dr. Lindi Adie from oncology and was started on Ibrance with letrozole.  12/2: s/p  fluoroscopic guided placement of G-tube  Patient in room, seems anxious about the visit she had with the SLP. Concerned about losing her ability to swallow. Provided active listening for patient. Reviewed the strategies the SLP provided to help with secretions and phlegm.  Reviewed tube feeding plan with patient. Explained that pt would receive a low rate of tube feeding at first and based on labs and tolerance would increase towards goal.  If patient tolerates continuous feed, can transition to bolus feeds. Will provided recommendations above.  MD provided clearance to start tube feeds today pending IR clearance to use tube (which was ordered at 0953) today.   Pt requested I call her eldest daughter, Anderson Malta, and review the plan with her. RD called daughter and explained TF plan. Pt's daughter appreciative for call.   Admission weight: 104 lbs. Daily weights will be ordered with TF protocol.  Medications: Multivitamin with minerals daily  Labs reviewed: Low K Mg WNL  Diet Order:   Diet Order            Diet clear liquid Room service appropriate? Yes; Fluid consistency: Thin  Diet effective now                 EDUCATION NEEDS:   Not appropriate for education at this time  Skin:  Skin Assessment: Reviewed RN Assessment  Last BM:  12/20 -type 6  Height:   Ht Readings from Last 1 Encounters:  04/11/20 5\' 6"  (1.676 m)    Weight:   Wt Readings from Last 1 Encounters:  04/10/20 47.5 kg   BMI:  Body mass index is 16.9 kg/m.  Estimated Nutritional  Needs:   Kcal:  1750-1950  Protein:  80-95g  Fluid:  1.9L/day  Clayton Bibles, MS, RD, LDN Inpatient Clinical Dietitian Contact information available via Amion

## 2020-04-17 DIAGNOSIS — E870 Hyperosmolality and hypernatremia: Secondary | ICD-10-CM

## 2020-04-17 DIAGNOSIS — E876 Hypokalemia: Secondary | ICD-10-CM

## 2020-04-17 DIAGNOSIS — E43 Unspecified severe protein-calorie malnutrition: Secondary | ICD-10-CM

## 2020-04-17 LAB — GLUCOSE, CAPILLARY
Glucose-Capillary: 132 mg/dL — ABNORMAL HIGH (ref 70–99)
Glucose-Capillary: 156 mg/dL — ABNORMAL HIGH (ref 70–99)
Glucose-Capillary: 171 mg/dL — ABNORMAL HIGH (ref 70–99)
Glucose-Capillary: 145 mg/dL — ABNORMAL HIGH (ref 70–99)
Glucose-Capillary: 120 mg/dL — ABNORMAL HIGH (ref 70–99)
Glucose-Capillary: 154 mg/dL — ABNORMAL HIGH (ref 70–99)

## 2020-04-17 MED ORDER — OSMOLITE 1.5 CAL PO LIQD
120.0000 mL | Freq: Every day | ORAL | Status: DC
  Administered 2020-04-18 (×3): 120 mL
  Filled 2020-04-17 (×5): qty 237

## 2020-04-17 NOTE — Progress Notes (Signed)
Nutrition Follow-up  DOCUMENTATION CODES:   Severe malnutrition in context of chronic illness,Underweight  INTERVENTION:   Monitor magnesium, potassium, and phosphorus daily for at least 3 days, MD to replete as needed, as pt is at risk for refeeding syndrome.  12/22: -Continue to advance Osmolite 1.5 to goal rate by 10 ml every 12 hours. Currently at 30 ml/hr via G-tube. Goal is 55 ml/hr.  -Encourage sipping on fluids -Multivitamin with minerals daily  12/23: -Start transition to bolus feeds in AM -120 ml (1/2 carton) Osmolite 1.5 five times daily -Free water flushes of 60 ml before and after each bolus  Goal rate:  -Advance as tolerated to goal of 1 carton (237 ml) Osmolite 1.5 five times daily via G-tube. -Free water flushes of 60 ml before and after each bolus (600 ml daily) -Additional water can be provided via PO or via tube -Provides 1775 kcals, 74g protein and 1505 ml H2O  NUTRITION DIAGNOSIS:   Severe Malnutrition related to chronic illness,cancer and cancer related treatments as evidenced by percent weight loss,energy intake < or equal to 75% for > or equal to 1 month,severe fat depletion,severe muscle depletion.  Ongoing.  GOAL:   Patient will meet greater than or equal to 90% of their needs  Progressing.  MONITOR:   PO intake,Supplement acceptance,Labs,Weight trends,I & O's  ASSESSMENT:   79 year old female with past medical history significant for breast cancer, newly diagnosed metastatic breast cancer, generalized anxiety disorder, hyperlipidemia, GERD, vitamin D deficiency who presented to the hospital with complaints of nausea, vomiting and poor oral intake.  Patient was recently admitted to [facility] in [place] and was discharged home several weeks ago.  At that time, patient had complained of back and hip pain and was found to have metastatic breast cancer with multiple bony lesions.  She received radiation well inpatient.  She has since followed  up with Dr. Lindi Adie from oncology and was started on Ibrance with letrozole.  Patient states she is refusing to have blood drawn for labs d/t having pain in her veins. Explained importance of monitoring her labs for refeeding syndrome. Pt may be more compliant this afternoon.  Pt states she does not feel ready to go home. Stated to pt that RD has been in communication with Carolynn Sayers from [place].  Pt may be able to go home tomorrow after bolus feedings established. Pt can advance at home with support from [employer].  Pt is drinking some fluids but still reports issues with mucus. Pt requesting some more cranberry juice, RD provided.  Encouraged her to keep sipping on fluids. Boost still sitting at bedside.  Admission weight: 104 lbs. Current weight: 110 lbs.  Medications: Multivitamin with minerals daily  Labs reviewed: CBGs: 131-171 Last Mg/Phos yesterday 12/21: low Mg, Phos WNL  Diet Order:   Diet Order            Diet clear liquid Room service appropriate? Yes; Fluid consistency: Thin  Diet effective now                 EDUCATION NEEDS:   Not appropriate for education at this time  Skin:  Skin Assessment: Reviewed RN Assessment  Last BM:  12/20 -type 6  Height:   Ht Readings from Last 1 Encounters:  04/11/20 5\' 6"  (1.676 m)    Weight:   Wt Readings from Last 1 Encounters:  04/17/20 50.2 kg    BMI:  Body mass index is 17.86 kg/m.  Estimated Nutritional Needs:  Kcal:  1750-1950  Protein:  80-95g  Fluid:  1.9L/day  Clayton Bibles, MS, RD, LDN Inpatient Clinical Dietitian Contact information available via Amion

## 2020-04-17 NOTE — Progress Notes (Signed)
Patient ID: Jodi Kaufman, female   DOB: Jan 17, 1941, 79 y.o.   MRN: [id number]  PROGRESS NOTE    Jodi Kaufman  SMO:[id number] DOB: October 08, 1940 DOA: 04/10/2020 PCP: Hali Marry, MD   Brief Narrative:  79 year old female with past medical history significant for breast cancer, newly diagnosed metastatic breast cancer, generalized anxiety disorder, hyperlipidemia, GERD, vitamin D deficiency who presented to the hospital with complaints of nausea, vomiting and poor oral intake. Patient was recently admitted to [facility] in [place] and was discharged home several weeks ago. At that time, patient had complained of back and hip pain and was found to have metastatic breast cancer with multiple bony lesions and received radiation while inpatient. She has since followed up with Dr. Lindi Adie from oncology and was started on Ibrance with letrozole. Patient met with oncology, was recommended for PEG placement.  She underwent PEG tube placement on 04/15/2020 and tube feeding was started on 04/16/2020.  Assessment & Plan:   Failure to thrive with metastatic breast cancer Severe protein calorie malnutrition -underwent PEG tube placement on 04/15/2020 and tube feeding was started on 04/16/2020.  Tube feeding not at goal yet.  Dietary following.  Possibly, she will be at goal of 55 cc an hour by tomorrow she could be discharged home.  Care management following -Remains full code although prognosis is poor.  Palliative care has evaluated the patient during this hospitalization  Hypernatremia Dehydration -Resolved with IV fluids.  Sodium 136 on 04/16/2020.  Patient refused labs today.  Right upper quadrant abdominal pain with elevated LFTs -LFTs trending down.  Right upper quadrant ultrasound showed gallbladder sludge without cholecystitis  Anxiety -Ativan as needed  Hypokalemia Hypomagnesemia -Refused labs today  DVT prophylaxis: Lovenox Code Status: Full Family Communication: None  at bedside Disposition Plan: Status is: Inpatient  Remains inpatient appropriate because:Inpatient level of care appropriate due to severity of illness   Dispo: The patient is from: Home              Anticipated d/c is to: Home              Anticipated d/c date is: 1 day              Patient currently is not medically stable to d/c.  Consultants: Oncology/palliative care/IR  Procedures: underwent PEG tube placement on 04/15/2020  Antimicrobials: None   Subjective: Patient seen and examined at bedside.  Poor historian.  Does not want to participate in conversation much.  No overnight fever, vomiting reported.  Nursing staff reports that patient is tolerating tube feeding  Objective: Vitals:   04/16/20 0611 04/16/20 1201 04/16/20 2123 04/17/20 0501  BP: 120/76 121/71 (!) 107/47 110/63  Pulse: 82 97 94 82  Resp: _0 Temp: (!) 97.5 F (36.4 C) 98 F (36.7 C) 98.1 F (36.7 C) 97.9 F (36.6 C)  TempSrc: Oral Oral Oral Oral  SpO2: 96% 94% 95% 96%  Weight:    50.2 kg  Height:        Intake/Output Summary (Last 24 hours) at 04/17/2020 1136 Last data filed at 04/17/2020 0509 Gross per 24 hour  Intake 1290.83 ml  Output 50 ml  Net 1240.83 ml   Filed Weights   04/10/20 1946 04/17/20 0501  Weight: 47.5 kg 50.2 kg    Examination:  General exam: Appears calm and comfortable.  Awake but poor historian and does not want to participate much in conversation.  Chronically ill.  Looks very thinly built. Respiratory  system: Bilateral decreased breath sounds at bases Cardiovascular system: S1 & S2 heard, Rate controlled Gastrointestinal system: Abdomen is nondistended, soft and nontender. Normal bowel sounds heard.  PEG tube present Extremities: No cyanosis, clubbing, edema  Central nervous system: Alert and oriented.  Very poor historian.  No focal neurological deficits. Moving extremities Skin: No obvious ecchymosis/lesions psychiatry: Anxious.  Intermittently gets  upset.   Data Reviewed: I have personally reviewed following labs and imaging studies  CBC: Recent Labs  Lab 04/10/20 1259 04/11/20 0549 04/16/20 0535  WBC 6.4 4.8 7.7  NEUTROABS 5.5  --   --   HGB 19.7* 15.4* 14.6  HCT 60.3* 46.3* 43.4  MCV 92.1 90.6 90.0  PLT 279 230 814   Basic Metabolic Panel: Recent Labs  Lab 04/11/20 0549 04/12/20 0621 04/13/20 1106 04/14/20 0615 04/16/20 0535 04/16/20 1641  NA 145 147* 143 140 136  --   K 3.8 3.2* 4.0 3.9 3.1*  --   CL 107 109 109 106 101  --   CO2 _0 --   GLUCOSE 150* 113* 115* 100* 80  --   BUN 36* _1 7*  --   CREATININE 0.62 0.58 0.50 0.57 0.37*  --   CALCIUM 9.1 9.2 8.6* 8.4* 8.3*  --   MG  --   --  1.9  --  1.7 1.6*  PHOS  --   --   --   --  2.8 2.5   GFR: Estimated Creatinine Clearance: 45.9 mL/min (A) (by C-G formula based on SCr of 0.37 mg/dL (L)). Liver Function Tests: Recent Labs  Lab 04/11/20 0549 04/12/20 0621 04/13/20 1106 04/14/20 0615 04/16/20 0535  AST 44* 89* 71* 46* 22  ALT 60* 97* 106* 79* 37  ALKPHOS 459* 476* 493* 557* 504*  BILITOT 1.4* 1.3* 1.1 1.0 1.2  PROT 5.1* 4.7* 4.7* 4.8* 4.5*  ALBUMIN 2.7* 2.6* 2.6* 2.5* 2.4*   No results for input(s): LIPASE, AMYLASE in the last 168 hours. No results for input(s): AMMONIA in the last 168 hours. Coagulation Profile: Recent Labs  Lab 04/13/20 0816  INR 1.3*   Cardiac Enzymes: No results for input(s): CKTOTAL, CKMB, CKMBINDEX, TROPONINI in the last 168 hours. BNP (last 3 results) No results for input(s): PROBNP in the last 8760 hours. HbA1C: No results for input(s): HGBA1C in the last 72 hours. CBG: Recent Labs  Lab 04/16/20 1638 04/16/20 2125 04/17/20 0216 04/17/20 0503 04/17/20 0800  GLUCAP 70 131* 132* 156* 171*   Lipid Profile: No results for input(s): CHOL, HDL, LDLCALC, TRIG, CHOLHDL, LDLDIRECT in the last 72 hours. Thyroid Function Tests: No results for input(s): TSH, T4TOTAL, FREET4, T3FREE, THYROIDAB in the  last 72 hours. Anemia Panel: No results for input(s): VITAMINB12, FOLATE, FERRITIN, TIBC, IRON, RETICCTPCT in the last 72 hours. Sepsis Labs: Recent Labs  Lab 04/10/20 1259 04/10/20 1817 04/10/20 2050  LATICACIDVEN 3.6* 2.2* 1.8    Recent Results (from the past 240 hour(s))  Resp Panel by RT-PCR (Flu A&B, Covid) Nasopharyngeal Swab     Status: None   Collection Time: 04/10/20  2:36 PM   Specimen: Nasopharyngeal Swab; Nasopharyngeal(NP) swabs in vial transport medium  Result Value Ref Range Status   SARS Coronavirus 2 by RT PCR NEGATIVE NEGATIVE Final    Comment: (NOTE) SARS-CoV-2 target nucleic acids are NOT DETECTED.  The SARS-CoV-2 RNA is generally detectable in upper respiratory specimens during the acute phase of infection. The lowest concentration of SARS-CoV-2 viral copies  this assay can detect is 138 copies/mL. A negative result does not preclude SARS-Cov-2 infection and should not be used as the sole basis for treatment or other patient management decisions. A negative result may occur with  improper specimen collection/handling, submission of specimen other than nasopharyngeal swab, presence of viral mutation(s) within the areas targeted by this assay, and inadequate number of viral copies(<138 copies/mL). A negative result must be combined with clinical observations, patient history, and epidemiological information. The expected result is Negative.  Fact Sheet for Patients:  EntrepreneurPulse.com.au  Fact Sheet for Healthcare Providers:  IncredibleEmployment.be  This test is no t yet approved or cleared by the [place] FDA and  has been authorized for detection and/or diagnosis of SARS-CoV-2 by FDA under an Emergency Use Authorization (EUA). This EUA will remain  in effect (meaning this test can be used) for the duration of the COVID-19 declaration under Section 564(b)(1) of the Act, 21 U.S.C.section 360bbb-3(b)(1), unless  the authorization is terminated  or revoked sooner.       Influenza A by PCR NEGATIVE NEGATIVE Final   Influenza B by PCR NEGATIVE NEGATIVE Final    Comment: (NOTE) The Xpert Xpress SARS-CoV-2/FLU/RSV plus assay is intended as an aid in the diagnosis of influenza from Nasopharyngeal swab specimens and should not be used as a sole basis for treatment. Nasal washings and aspirates are unacceptable for Xpert Xpress SARS-CoV-2/FLU/RSV testing.  Fact Sheet for Patients: EntrepreneurPulse.com.au  Fact Sheet for Healthcare Providers: IncredibleEmployment.be  This test is not yet approved or cleared by the [place] FDA and has been authorized for detection and/or diagnosis of SARS-CoV-2 by FDA under an Emergency Use Authorization (EUA). This EUA will remain in effect (meaning this test can be used) for the duration of the COVID-19 declaration under Section 564(b)(1) of the Act, 21 U.S.C. section 360bbb-3(b)(1), unless the authorization is terminated or revoked.  Performed at [facility], [place] [address], [place],  [address]          Radiology Studies: IR GASTROSTOMY TUBE MOD SED  Result Date: 04/15/2020 INDICATION: Failure to thrive. Please perform percutaneous gastrostomy tube placement for enteric nutrition supplementation purposes. EXAM: PULL TROUGH GASTROSTOMY TUBE PLACEMENT COMPARISON:  Abdominal CT-04/12/2020; esophagram-04/13/2019 MEDICATIONS: Ancef 2 gm IV; Antibiotics were administered within 1 hour of the procedure. Glucagon 1 mg IV CONTRAST:  20 mL of Omnipaque 300 administered into the gastric lumen. ANESTHESIA/SEDATION: Moderate (conscious) sedation was employed during this procedure. A total of Versed 3.5 mg and Fentanyl 100 mcg was administered intravenously. Moderate Sedation Time: 15 minutes. The patient's level of consciousness and vital signs were monitored continuously by radiology nursing  throughout the procedure under my direct supervision. FLUOROSCOPY TIME:  6 minutes, 42 seconds (60 mGy) COMPLICATIONS: None immediate. PROCEDURE: Informed written consent was obtained from the patient following explanation of the procedure, risks, benefits and alternatives. A time out was performed prior to the initiation of the procedure. Ultrasound scanning was performed to demarcate the edge of the left lobe of the liver. Maximal barrier sterile technique utilized including caps, mask, sterile gowns, sterile gloves, large sterile drape, hand hygiene and Betadine prep. The left upper quadrant was sterilely prepped and draped. An oral gastric catheter was inserted into the stomach under fluoroscopy. The existing nasogastric feeding tube was removed. The left costal margin and the air and barium opacified transverse colon were identified and avoided. Air was injected into the stomach for insufflation and visualization under fluoroscopy. Under sterile conditions a 17 gauge  trocar needle was utilized to access the stomach percutaneously beneath the left subcostal margin after the overlying soft tissues were anesthetized with 1% Lidocaine with epinephrine. Needle position was confirmed within the stomach with aspiration of air and injection of small amount of contrast. A single T tack was deployed for gastropexy. Over an Amplatz guide wire, a 9-French sheath was inserted into the stomach. A snare device was utilized to capture the oral gastric catheter. The snare device was pulled retrograde from the stomach up the esophagus and out the oropharynx. The 20-French pull-through gastrostomy was connected to the snare device and pulled antegrade through the oropharynx down the esophagus into the stomach and then through the percutaneous tract external to the patient. The gastrostomy was assembled externally. Contrast injection confirms position in the stomach. Several spot radiographic images were obtained in various  obliquities for documentation. The patient tolerated procedure well without immediate post procedural complication. FINDINGS: After successful fluoroscopic guided placement, the gastrostomy tube is appropriately positioned with internal disc against the ventral aspect of the gastric lumen. IMPRESSION: Successful fluoroscopic insertion of a 20-French pull-through gastrostomy tube. The gastrostomy may be used immediately for medication administration and in 24 hrs for the initiation of feeds. Electronically Signed   By: Sandi Mariscal M.D.   On: 04/15/2020 12:11        Scheduled Meds: . enoxaparin (LOVENOX) injection  40 mg Subcutaneous Q24H  . multivitamin with minerals  1 tablet Per Tube Daily  . pantoprazole sodium  40 mg Per Tube Daily  . sodium chloride flush  3 mL Intravenous Q12H   Continuous Infusions: . feeding supplement (OSMOLITE 1.5 CAL) 30 mL/hr at 04/17/20 0232          Aline August, MD [facility] 04/17/2020, 11:36 AM

## 2020-04-17 NOTE — Progress Notes (Signed)
Occupational Therapy Treatment Patient Details Name: Jodi Kaufman MRN: [id number] DOB: [birth year] Today's Date: 04/17/2020    History of present illness 79 y.o. female with medical history significant of metastatic breast cancer, vitamin D deficiency, mild emphysema, generalized anxiety disorder, hyperlipidemia and GERD came to [facility] ED with complaint of nausea, vomiting and poor p.o. intake. Patient was discharged several weeks ago from [facility] in [place]. Pt with metastatic breast cancer, with mets to ribs, spine, pelvis, s/p treatment for spinal mets. s/p g tube placement 12/20.   OT comments  Patient reports fatigue after toileting this morning with nursing - however, agreeable to therapy. Patient supervision for bed mobility. Min guard to stand from elevated bed height with RW and ambulate to bathroom to stand and perform grooming task. Patient not agreeable to sit in the chair at this time. Educated patient  Extended amount of time answering patient questions in regards to progress and POC.   Follow Up Recommendations  Home health OT    Equipment Recommendations  3 in 1 bedside commode    Recommendations for Other Services      Precautions / Restrictions Precautions Precautions: Fall Precaution Comments: spinal mets, anxiety Restrictions Weight Bearing Restrictions: No       Mobility Bed Mobility Overal bed mobility: Needs Assistance Bed Mobility: Supine to Sit;Sit to Supine     Supine to sit: Supervision Sit to supine: Supervision      Transfers Overall transfer level: Needs assistance Equipment used: Rolling walker (2 wheeled) Transfers: Sit to/from [facility] Sit to Stand: Min guard Stand pivot transfers: Min guard            Balance Overall balance assessment: Mild deficits observed, not formally tested                                         ADL either performed or assessed with clinical judgement   ADL        Grooming: Wash/dry face;Wash/dry hands;Standing Grooming Details (indicate cue type and reason): Patient ambulated to bathroom with RW with min guard. Stood at sink to perform face and hand washing in standing.                                     Vision Patient Visual Report: No change from baseline     Perception     Praxis      Cognition Arousal/Alertness: Awake/alert Behavior During Therapy: WFL for tasks assessed/performed Overall Cognitive Status: Within Functional Limits for tasks assessed                                          Exercises     Shoulder Instructions       General Comments      Pertinent Vitals/ Pain       Pain Assessment: No/denies pain  Home Living                                          Prior Functioning/Environment              Frequency  Min 2X/week  Progress Toward Goals  OT Goals(current goals can now be found in the care plan section)  Progress towards OT goals: Progressing toward goals  Acute Rehab OT Goals Patient Stated Goal: To walk and be more active OT Goal Formulation: With patient Time For Goal Achievement: 04/27/20 Potential to Achieve Goals: Good  Plan Discharge plan remains appropriate    Co-evaluation                 AM-PAC OT "6 Clicks" Daily Activity     Outcome Measure   Help from another person eating meals?: None Help from another person taking care of personal grooming?: A Little Help from another person toileting, which includes using toliet, bedpan, or urinal?: A Little Help from another person bathing (including washing, rinsing, drying)?: A Little Help from another person to put on and taking off regular upper body clothing?: A Little Help from another person to put on and taking off regular lower body clothing?: A Little 6 Click Score: 19    End of Session Equipment Utilized During Treatment: Gait belt;Rolling  walker  OT Visit Diagnosis: Muscle weakness (generalized) (M62.81);Pain;Adult, failure to thrive (R62.7)   Activity Tolerance Patient tolerated treatment well   Patient Left in chair;with call bell/phone within reach;with chair alarm set   Nurse Communication  (okay to see per Rn)        Time: 1601-0932 OT Time Calculation (min): 24 min  Charges: OT General Charges $OT Visit: 1 Visit OT Treatments $Self Care/Home Management : 23-37 mins  Waldron Session, OTR/L Acute Care Rehab Services  Office [phone] Pager: [phone]    Kelli Churn 04/17/2020, 1:07 PM

## 2020-04-18 LAB — GLUCOSE, CAPILLARY
Glucose-Capillary: 105 mg/dL — ABNORMAL HIGH (ref 70–99)
Glucose-Capillary: 113 mg/dL — ABNORMAL HIGH (ref 70–99)
Glucose-Capillary: 135 mg/dL — ABNORMAL HIGH (ref 70–99)
Glucose-Capillary: 155 mg/dL — ABNORMAL HIGH (ref 70–99)
Glucose-Capillary: 84 mg/dL (ref 70–99)

## 2020-04-18 LAB — BASIC METABOLIC PANEL
Anion gap: 10 (ref 5–15)
BUN: 12 mg/dL (ref 8–23)
CO2: 23 mmol/L (ref 22–32)
Calcium: 8.3 mg/dL — ABNORMAL LOW (ref 8.9–10.3)
Chloride: 102 mmol/L (ref 98–111)
Creatinine, Ser: 0.52 mg/dL (ref 0.44–1.00)
GFR, Estimated: >60 mL/min (ref >60)
Glucose, Bld: 137 mg/dL — ABNORMAL HIGH (ref 70–99)
Potassium: 4.2 mmol/L (ref 3.5–5.1)
Sodium: 135 mmol/L (ref 135–145)

## 2020-04-18 LAB — MAGNESIUM: Magnesium: 1.7 mg/dL (ref 1.7–2.4)

## 2020-04-18 MED ORDER — ONDANSETRON HCL 4 MG PO TABS: 4.0000 mg | ORAL_TABLET | Freq: Four times a day (QID) | ORAL | 0 refills | Status: DC | PRN

## 2020-04-18 MED ORDER — POLYETHYLENE GLYCOL 3350 17 G PO PACK: 17.0000 g | PACK | Freq: Every day | ORAL | 0 refills | Status: DC | PRN

## 2020-04-18 MED ORDER — PANTOPRAZOLE SODIUM 40 MG PO PACK: 40.0000 mg | PACK | Freq: Every day | ORAL | 0 refills | Status: DC

## 2020-04-18 MED ORDER — PALBOCICLIB 100 MG PO TABS: 100.0000 mg | ORAL_TABLET | Freq: Every day | ORAL | Status: DC

## 2020-04-18 MED ORDER — LETROZOLE 2.5 MG PO TABS: 2.5000 mg | ORAL_TABLET | Freq: Every day | ORAL | Status: DC

## 2020-04-18 MED ORDER — OSMOLITE 1.5 CAL PO LIQD: 120.0000 mL | Freq: Every day | ORAL | 20 refills | Status: DC

## 2020-04-18 NOTE — Progress Notes (Signed)
[employer] [facility])  Jodi Kaufman is our current palliative care pt in the community.  Noted that she is ready to d/c.  Our palliative team will resume services once she is back home.  Venia Carbon RN, BSN, [facility] Liaison

## 2020-04-18 NOTE — TOC Transition Note (Addendum)
Transition of Care [facility]) - CM/SW Discharge Note   Patient Details  Name: Jodi Kaufman MRN: [id number] Date of Birth: 04/29/1940  Transition of Care [facility]) CM/SW Contact:  Keoki Mchargue, Marjie Skiff, RN Phone Number: 04/18/2020, 10:50 AM   Clinical Narrative:    Pt to dc home on new bolus tube feeds today. Home health and tube feed orders received by MD. Alvis Lemmings and Pam from [place] aware of DC plan for today.  RW ordered from [facility]  Final next level of care: [place] Barriers to Discharge: Continued Medical Work up   Patient Goals and CMS Choice Patient states their goals for this hospitalization and ongoing recovery are:: To go home with daughter CMS Medicare.gov Compare Post Acute Care list provided to:: Patient Represenative (must comment) (Daughter [place] Go) Choice offered to / list presented to : Adult Children  Discharge Plan and Services   Discharge Planning Services: CM Consult Post Acute Care Choice: Home Health          DME Arranged: Tube feeding DME Agency: Other - Comment [facility]sid[facility] Evert) Date DME Agency Contacted: 04/16/20 Time DME Agency Contacted: [phone] Representative spoke with at DME Agency: [place]: RN,PT [place] Agency: [place] Date [place]: 04/16/20 Time [place]: [phone] Representative spoke with at [place]: [place] ([place]) Interventions     Readmission Risk Interventions No flowsheet data found.

## 2020-04-18 NOTE — Discharge Summary (Signed)
Physician Discharge Summary  Jodi Kaufman IRS:[id number] DOB: 03-18-1941 DOA: 04/10/2020  PCP: Jodi Marry, MD  Admit date: 04/10/2020 Discharge date: 04/18/2020  Admitted From: Home Disposition: Home  Recommendations for Outpatient Follow-up:  1. Follow up with PCP in 1 week with repeat CBC/BMP 2. Outpatient follow-up with oncology 3. Recommend outpatient evaluation and follow-up by palliative care to continue goals of care discussion 4. Follow up in ED if symptoms worsen or new appear   Home Health: Home with PT/RN Equipment/Devices: PEG tube placement on 04/15/2020  Discharge Condition: Guarded to poor  CODE STATUS: Full Diet recommendation: Tube feeding diet as per dietary recommendations -120 ml (1/2 carton) Osmolite 1.5 five times daily -Free water flushes of 60 ml before and after each bolus  Goal rate:  -Advance as tolerated to goal of 1 carton (237 ml) Osmolite 1.5 five times daily via G-tube. -Free water flushes of 60 ml before and after each bolus (600 ml daily)  Brief/Interim Summary: 79 year old female with past medical history significant for breast cancer, newly diagnosed metastatic breast cancer, generalized anxiety disorder, hyperlipidemia, GERD, vitamin D deficiency who presented to the hospital with complaints of nausea, vomiting and poor oral intake. Patient was recently admitted to [facility] in [place] and was discharged home several weeks ago. At that time, patient had complained of back and hip pain and was found to have metastatic breast cancer with multiple bony lesions and received radiation while inpatient. She has since followed up with Dr. Lindi Adie from oncology and was started on Ibrance with letrozole. Patient met with oncology, was recommended for PEG placement.  She underwent PEG tube placement on 04/15/2020 and tube feeding was started on 04/16/2020.  She is being transitioned to bolus tube feedings today.  She will be discharged  home today with home health PT/RN.   Discharge Diagnoses:   Failure to thrive with metastatic breast cancer Severe protein calorie malnutrition -underwent PEG tube placement on 04/15/2020 and tube feeding was started on 04/16/2020. -She is being transitioned to bolus tube feedings today.  She will be discharged home today with home health PT/RN. -Currently tolerating diet  -Remains full code although prognosis is poor.  Palliative care has evaluated the patient during this hospitalization.  Recommend outpatient evaluation and follow-up by palliative care -Outpatient follow-up with oncology/Dr. Lequita Asal  Hypernatremia Dehydration -Resolved with IV fluids.  Sodium 135 today.  Outpatient follow-up.  Right upper quadrant abdominal pain with elevated LFTs -LFTs trending down.  Right upper quadrant ultrasound showed gallbladder sludge without cholecystitis  Anxiety -Outpatient follow-up  Hypokalemia Hypomagnesemia -Improved  Discharge Instructions  Discharge Instructions    Amb Referral to Palliative Care   Complete by: As directed    Goals of care   Ambulatory referral to Hematology / Oncology   Complete by: As directed    Increase activity slowly   Complete by: As directed      Allergies as of 04/18/2020      Reactions   Atorvastatin    Other reaction(s): Other (comments) Side effects   Clavulanic Acid Hives   Tamoxifen Nausea Only      Medication List    STOP taking these medications   CITRACAL PO   LORazepam 1 MG tablet Commonly known as: ATIVAN   omeprazole 20 MG capsule Commonly known as: PRILOSEC   ondansetron 4 MG disintegrating tablet Commonly known as: Zofran ODT   Tylenol Cold & Flu Day/Night 30-15-500 & 30- 15-2-$RemoveBe'500MG'KgpQFHFaC$  Tbpk Generic drug: PSE-DM-APAP & PSE-DM-CPM-APAP  TAKE these medications   feeding supplement (OSMOLITE 1.5 CAL) Liqd Place 120 mLs into feeding tube 5 (five) times daily. Advance to 259ml 5 times a day if tolerated    letrozole 2.5 MG tablet Commonly known as: FEMARA Take 1 tablet (2.5 mg total) by mouth daily. Per tube What changed: additional instructions   ondansetron 4 MG tablet Commonly known as: ZOFRAN Place 1 tablet (4 mg total) into feeding tube every 6 (six) hours as needed for nausea.   oxyCODONE 5 MG immediate release tablet Commonly known as: Oxy IR/ROXICODONE Take 2.5 mg by mouth every 4 (four) hours as needed for severe pain.   palbociclib 100 MG tablet Commonly known as: Ibrance Take 1 tablet (100 mg total) by mouth daily. Take for 21 days on, 7 days off, repeat every 28 days. Per tube What changed: additional instructions   pantoprazole sodium 40 mg/20 mL Pack Commonly known as: PROTONIX Place 20 mLs (40 mg total) into feeding tube daily.   polyethylene glycol 17 g packet Commonly known as: MIRALAX / GLYCOLAX Place 17 g into feeding tube daily as needed for mild constipation.       Follow-up Information    Jodi Marry, MD. Schedule an appointment as soon as possible for a visit in 1 week(s).   Specialty: Family Medicine Why: with repeat cbc/bmp Contact information: [address] [place] [address] [phone]              Allergies  Allergen Reactions  . Atorvastatin     Other reaction(s): Other (comments) Side effects  . Clavulanic Acid Hives  . Tamoxifen Nausea Only    Consultations: Oncology/palliative care/IR   Procedures/Studies: CT ABDOMEN WO CONTRAST  Result Date: 04/12/2020 CLINICAL DATA:  79 year old female referred for a workup of possible percutaneous gastrostomy. Known malignancy with metastases. EXAM: CT ABDOMEN WITHOUT CONTRAST TECHNIQUE: Multidetector CT imaging of the abdomen was performed following the standard protocol without IV contrast. COMPARISON:  None. FINDINGS: Lower chest: Expansile lesion of the inferior right eighth rib on image 17 of series 4. Right ninth rib lesion on image 45 of series 4.  Left sixth rib lesion incompletely imaged on image 2 of series 4. Atelectatic changes at the lung bases.  Coronary calcifications. Hepatobiliary: Low-density lesion within the medial aspect of segment 4 B of the liver, poorly characterized on noncontrast CT. Unremarkable gallbladder. Pancreas: Unremarkable Spleen: Unremarkable Adrenals/Urinary Tract: Unremarkable adrenal glands. Unremarkable kidneys. Stomach/Bowel: Stomach decompressed. No inflammatory changes. Unremarkable visualized small bowel without abnormal distention. Visualized colon unremarkable. Vascular/Lymphatic: Atherosclerotic calcifications of the abdominal aorta and proximal iliac arteries. Other: None Musculoskeletal: Lytic lesion within the right iliac crest. Destructive bone lesion within the left L4 lamina. Lytic lesion within the L3 spinous process. Destructive lytic lesion within the left aspect of the L3 vertebral body. Destructive lytic lesion within the left aspect of the L2 vertebral body. Sclerotic lesion at the anterior inferior L1 vertebral body. Mixed sclerotic and lucent lesion in the right aspect of T12 vertebral body. Destructive lesion with lucent and sclerotic features within the T11 vertebral body. No bony canal narrowing. IMPRESSION: Bony metastatic disease involving the axial and appendicular skeleton, which is compatible with the given history of known malignancy and metastases, as above. Low-density lesion of segment 4B in the liver, concerning for liver metastases though incompletely characterized on the noncontrast CT. Electronically Signed   By: Corrie Mckusick D.O.   On: 04/12/2020 15:12   CT Head Wo Contrast  Result Date: 04/10/2020 CLINICAL DATA:  Metastatic disease evaluation. EXAM: CT HEAD WITHOUT CONTRAST TECHNIQUE: Contiguous axial images were obtained from the base of the skull through the vertex without intravenous contrast. COMPARISON:  None. FINDINGS: Brain: No evidence of acute infarction, hemorrhage,  hydrocephalus, extra-axial collection or mass lesion/mass effect. Mild scattered white matter hypoattenuation, which is nonspecific but most likely related to chronic microvascular ischemic disease. No midline shift. Vascular: Calcific atherosclerosis. Skull: Left occipital lytic calvarial lesion (series 3, image 12). Left frontal calvarial lytic lesion (series 3, image 16). Sinuses/Orbits: Visualized sinuses are clear.  Unremarkable orbits. Other: No mastoid effusions. IMPRESSION: 1. No evidence of acute intracranial abnormality. No obvious evidence of intracranial metastatic disease, although evaluation is limited on this noncontrast head CT. MRI with and without contrast could provide far more sensitive evaluation if clinically indicated. 2. Lytic calvarial lesions involving the left occipital and left frontal bones, concerning for osseous metastatic disease given the clinical history. Outside MRI (from 03/14/2020 in Care [facility]) describes additional areas of metastatic disease at the craniocervical junction, which are not well evaluated on this study. Electronically Signed   By: Margaretha Sheffield MD   On: 04/10/2020 15:54   US Abdomen Complete  Result Date: 04/10/2020 CLINICAL DATA:  Abnormal liver enzymes.  Breast carcinoma. EXAM: ABDOMEN ULTRASOUND COMPLETE COMPARISON:  None. FINDINGS: Gallbladder: Sludge in the gallbladder. A negative sonographic Percell Miller sign was reported by the sonographer. No wall thickening or pericholecystic fluid. Common bile duct: Diameter: 2 mm Liver: No focal lesion identified. Within normal limits in parenchymal echogenicity. Portal vein is patent on color Doppler imaging with normal direction of blood flow towards the liver. IVC: No abnormality visualized. Pancreas: Visualized portion unremarkable. Spleen: Size and appearance within normal limits. Right Kidney: Length: 10.7 cm.  Mild hydronephrosis. Left Kidney: Length: 9.8 cm.  There are two 1.0 cm renal cyst. Abdominal  aorta: No aneurysm visualized. There is atherosclerotic plaque. Other findings: None. IMPRESSION: 1. Mild right hydronephrosis. 2. Gallbladder sludge without other evidence of cholecystitis. 3.  Aortic atherosclerosis (ICD10-I70.0). Electronically Signed   By: Ulyses Jarred M.D.   On: 04/10/2020 19:17   IR GASTROSTOMY TUBE MOD SED  Result Date: 04/15/2020 INDICATION: Failure to thrive. Please perform percutaneous gastrostomy tube placement for enteric nutrition supplementation purposes. EXAM: PULL TROUGH GASTROSTOMY TUBE PLACEMENT COMPARISON:  Abdominal CT-04/12/2020; esophagram-04/13/2019 MEDICATIONS: Ancef 2 gm IV; Antibiotics were administered within 1 hour of the procedure. Glucagon 1 mg IV CONTRAST:  20 mL of Omnipaque 300 administered into the gastric lumen. ANESTHESIA/SEDATION: Moderate (conscious) sedation was employed during this procedure. A total of Versed 3.5 mg and Fentanyl 100 mcg was administered intravenously. Moderate Sedation Time: 15 minutes. The patient's level of consciousness and vital signs were monitored continuously by radiology nursing throughout the procedure under my direct supervision. FLUOROSCOPY TIME:  6 minutes, 42 seconds (60 mGy) COMPLICATIONS: None immediate. PROCEDURE: Informed written consent was obtained from the patient following explanation of the procedure, risks, benefits and alternatives. A time out was performed prior to the initiation of the procedure. Ultrasound scanning was performed to demarcate the edge of the left lobe of the liver. Maximal barrier sterile technique utilized including caps, mask, sterile gowns, sterile gloves, large sterile drape, hand hygiene and Betadine prep. The left upper quadrant was sterilely prepped and draped. An oral gastric catheter was inserted into the stomach under fluoroscopy. The existing nasogastric feeding tube was removed. The left costal margin and the air and barium opacified transverse colon were identified and avoided.  Air  was injected into the stomach for insufflation and visualization under fluoroscopy. Under sterile conditions a 17 gauge trocar needle was utilized to access the stomach percutaneously beneath the left subcostal margin after the overlying soft tissues were anesthetized with 1% Lidocaine with epinephrine. Needle position was confirmed within the stomach with aspiration of air and injection of small amount of contrast. A single T tack was deployed for gastropexy. Over an Amplatz guide wire, a 9-French sheath was inserted into the stomach. A snare device was utilized to capture the oral gastric catheter. The snare device was pulled retrograde from the stomach up the esophagus and out the oropharynx. The 20-French pull-through gastrostomy was connected to the snare device and pulled antegrade through the oropharynx down the esophagus into the stomach and then through the percutaneous tract external to the patient. The gastrostomy was assembled externally. Contrast injection confirms position in the stomach. Several spot radiographic images were obtained in various obliquities for documentation. The patient tolerated procedure well without immediate post procedural complication. FINDINGS: After successful fluoroscopic guided placement, the gastrostomy tube is appropriately positioned with internal disc against the ventral aspect of the gastric lumen. IMPRESSION: Successful fluoroscopic insertion of a 20-French pull-through gastrostomy tube. The gastrostomy may be used immediately for medication administration and in 24 hrs for the initiation of feeds. Electronically Signed   By: Sandi Mariscal M.D.   On: 04/15/2020 12:11   DG ESOPHAGUS W SINGLE CM (SOL OR THIN BA)  Result Date: 04/12/2020 CLINICAL DATA:  Dysphagia, unspecified. EXAM: ESOPHOGRAM/BARIUM SWALLOW TECHNIQUE: Single contrast examination was performed using thin barium or water soluble. FLUOROSCOPY TIME:  Fluoroscopy Time:  1 minutes, 12 seconds. Radiation  Exposure Index (if provided by the fluoroscopic device): 2.6 mGy Number of Acquired Spot Images: 0 COMPARISON:  Same day CT abdomen 04/12/2020. FINDINGS: Significantly limited examination due to the patient's limited ability to reposition and inability to drink at a rate sufficient to fully distend the esophagus. Additionally, the examination was prematurely terminated as the patient became sick and began to vomit. There was prompt passage of contrast through the esophagus and into the proximal stomach. Within the described limitations, the esophagus is smooth in contour. The distal esophagus/GE junction was not observed to fully distend. However, this may be due to the patient's inability to drink at a sufficient rate. No evidence of hiatal hernia. No gastroesophageal reflux was observed. IMPRESSION: Significantly limited and prematurely terminated examination, as described. Prompt passage of contrast through the esophagus and into the proximal stomach. The distal esophagus/GE junction was not observed to fully distend. However, this may be due to the patient's inability to drink at a sufficient rate. No evidence of hiatal hernia. No gastroesophageal reflux was observed. Electronically Signed   By: Kellie Simmering DO   On: 04/12/2020 15:42       Subjective: Patient seen and examined at bedside.  Denies any worsening abdominal pain.  No overnight fever, vomiting or worsening shortness of breath reported.  Discharge Exam: Vitals:   04/17/20 2115 04/18/20 0451  BP: 103/66 106/70  Pulse: 89 85  Resp: 16 16  Temp: 97.6 F (36.4 C) 97.7 F (36.5 C)  SpO2: 97% 96%    General: Pt is alert, awake, not in acute distress.  Elderly female, very thinly built.  Looks chronically ill.  Poor historian. Cardiovascular: rate controlled, S1/S2 + Respiratory: bilateral decreased breath sounds at bases Abdominal: Soft, NT, ND, bowel sounds +.  PEG tube in place. Extremities: no edema, no cyanosis  The results  of significant diagnostics from this hospitalization (including imaging, microbiology, ancillary and laboratory) are listed below for reference.     Microbiology: Recent Results (from the past 240 hour(s))  Resp Panel by RT-PCR (Flu A&B, Covid) Nasopharyngeal Swab     Status: None   Collection Time: 04/10/20  2:36 PM   Specimen: Nasopharyngeal Swab; Nasopharyngeal(NP) swabs in vial transport medium  Result Value Ref Range Status   SARS Coronavirus 2 by RT PCR NEGATIVE NEGATIVE Final    Comment: (NOTE) SARS-CoV-2 target nucleic acids are NOT DETECTED.  The SARS-CoV-2 RNA is generally detectable in upper respiratory specimens during the acute phase of infection. The lowest concentration of SARS-CoV-2 viral copies this assay can detect is 138 copies/mL. A negative result does not preclude SARS-Cov-2 infection and should not be used as the sole basis for treatment or other patient management decisions. A negative result may occur with  improper specimen collection/handling, submission of specimen other than nasopharyngeal swab, presence of viral mutation(s) within the areas targeted by this assay, and inadequate number of viral copies(<138 copies/mL). A negative result must be combined with clinical observations, patient history, and epidemiological information. The expected result is Negative.  Fact Sheet for Patients:  EntrepreneurPulse.com.au  Fact Sheet for Healthcare Providers:  IncredibleEmployment.be  This test is no t yet approved or cleared by the [place] FDA and  has been authorized for detection and/or diagnosis of SARS-CoV-2 by FDA under an Emergency Use Authorization (EUA). This EUA will remain  in effect (meaning this test can be used) for the duration of the COVID-19 declaration under Section 564(b)(1) of the Act, 21 U.S.C.section 360bbb-3(b)(1), unless the authorization is terminated  or revoked sooner.       Influenza A  by PCR NEGATIVE NEGATIVE Final   Influenza B by PCR NEGATIVE NEGATIVE Final    Comment: (NOTE) The Xpert Xpress SARS-CoV-2/FLU/RSV plus assay is intended as an aid in the diagnosis of influenza from Nasopharyngeal swab specimens and should not be used as a sole basis for treatment. Nasal washings and aspirates are unacceptable for Xpert Xpress SARS-CoV-2/FLU/RSV testing.  Fact Sheet for Patients: EntrepreneurPulse.com.au  Fact Sheet for Healthcare Providers: IncredibleEmployment.be  This test is not yet approved or cleared by the [place] FDA and has been authorized for detection and/or diagnosis of SARS-CoV-2 by FDA under an Emergency Use Authorization (EUA). This EUA will remain in effect (meaning this test can be used) for the duration of the COVID-19 declaration under Section 564(b)(1) of the Act, 21 U.S.C. section 360bbb-3(b)(1), unless the authorization is terminated or revoked.  Performed at [facility], [place] [address] [place], [place] [address]      Labs: BNP (last 3 results) No results for input(s): BNP in the last 8760 hours. Basic Metabolic Panel: Recent Labs  Lab 04/12/20 0621 04/13/20 1106 04/14/20 0615 04/16/20 0535 04/16/20 1641 04/18/20 0652  NA 147* 143 140 136  --  135  K 3.2* 4.0 3.9 3.1*  --  4.2  CL 109 109 106 101  --  102  CO2 $Re'28 26 26 25  'Nac$ --  23  GLUCOSE 113* 115* 100* 80  --  137*  BUN $Re'22 14 11 'MXK$ 7*  --  12  CREATININE 0.58 0.50 0.57 0.37*  --  0.52  CALCIUM 9.2 8.6* 8.4* 8.3*  --  8.3*  MG  --  1.9  --  1.7 1.6* 1.7  PHOS  --   --   --  2.8  2.5  --    Liver Function Tests: Recent Labs  Lab 04/12/20 0621 04/13/20 1106 04/14/20 0615 04/16/20 0535  AST 89* 71* 46* 22  ALT 97* 106* 79* 37  ALKPHOS 476* 493* 557* 504*  BILITOT 1.3* 1.1 1.0 1.2  PROT 4.7* 4.7* 4.8* 4.5*  ALBUMIN 2.6* 2.6* 2.5* 2.4*   No results for input(s): LIPASE, AMYLASE in the last 168 hours. No results  for input(s): AMMONIA in the last 168 hours. CBC: Recent Labs  Lab 04/16/20 0535  WBC 7.7  HGB 14.6  HCT 43.4  MCV 90.0  PLT 227   Cardiac Enzymes: No results for input(s): CKTOTAL, CKMB, CKMBINDEX, TROPONINI in the last 168 hours. BNP: Invalid input(s): POCBNP CBG: Recent Labs  Lab 04/17/20 1633 04/17/20 2118 04/18/20 0014 04/18/20 0455 04/18/20 0735  GLUCAP 120* 154* 155* 105* 113*   D-Dimer No results for input(s): DDIMER in the last 72 hours. Hgb A1c No results for input(s): HGBA1C in the last 72 hours. Lipid Profile No results for input(s): CHOL, HDL, LDLCALC, TRIG, CHOLHDL, LDLDIRECT in the last 72 hours. Thyroid function studies No results for input(s): TSH, T4TOTAL, T3FREE, THYROIDAB in the last 72 hours.  Invalid input(s): FREET3 Anemia work up No results for input(s): VITAMINB12, FOLATE, FERRITIN, TIBC, IRON, RETICCTPCT in the last 72 hours. Urinalysis    Component Value Date/Time   COLORURINE DARK YELLOW 11/09/2019 1407   APPEARANCEUR CLOUDY (A) 11/09/2019 1407   LABSPEC 1.027 11/09/2019 1407   PHURINE < OR = 5.0 11/09/2019 1407   GLUCOSEU NEGATIVE 11/09/2019 1407   HGBUR TRACE (A) 11/09/2019 1407   BILIRUBINUR small (A) 08/16/2019 0939   BILIRUBINUR small 02/22/2019 1011   KETONESUR TRACE (A) 11/09/2019 1407   PROTEINUR 1+ (A) 11/09/2019 1407   UROBILINOGEN 0.2 08/16/2019 0939   NITRITE NEGATIVE 11/09/2019 1407   LEUKOCYTESUR 2+ (A) 11/09/2019 1407   Sepsis Labs Invalid input(s): PROCALCITONIN,  WBC,  LACTICIDVEN Microbiology Recent Results (from the past 240 hour(s))  Resp Panel by RT-PCR (Flu A&B, Covid) Nasopharyngeal Swab     Status: None   Collection Time: 04/10/20  2:36 PM   Specimen: Nasopharyngeal Swab; Nasopharyngeal(NP) swabs in vial transport medium  Result Value Ref Range Status   SARS Coronavirus 2 by RT PCR NEGATIVE NEGATIVE Final    Comment: (NOTE) SARS-CoV-2 target nucleic acids are NOT DETECTED.  The SARS-CoV-2 RNA is  generally detectable in upper respiratory specimens during the acute phase of infection. The lowest concentration of SARS-CoV-2 viral copies this assay can detect is 138 copies/mL. A negative result does not preclude SARS-Cov-2 infection and should not be used as the sole basis for treatment or other patient management decisions. A negative result may occur with  improper specimen collection/handling, submission of specimen other than nasopharyngeal swab, presence of viral mutation(s) within the areas targeted by this assay, and inadequate number of viral copies(<138 copies/mL). A negative result must be combined with clinical observations, patient history, and epidemiological information. The expected result is Negative.  Fact Sheet for Patients:  EntrepreneurPulse.com.au  Fact Sheet for Healthcare Providers:  IncredibleEmployment.be  This test is no t yet approved or cleared by the [place] FDA and  has been authorized for detection and/or diagnosis of SARS-CoV-2 by FDA under an Emergency Use Authorization (EUA). This EUA will remain  in effect (meaning this test can be used) for the duration of the COVID-19 declaration under Section 564(b)(1) of the Act, 21 U.S.C.section 360bbb-3(b)(1), unless the authorization is terminated  or revoked  sooner.       Influenza A by PCR NEGATIVE NEGATIVE Final   Influenza B by PCR NEGATIVE NEGATIVE Final    Comment: (NOTE) The Xpert Xpress SARS-CoV-2/FLU/RSV plus assay is intended as an aid in the diagnosis of influenza from Nasopharyngeal swab specimens and should not be used as a sole basis for treatment. Nasal washings and aspirates are unacceptable for Xpert Xpress SARS-CoV-2/FLU/RSV testing.  Fact Sheet for Patients: EntrepreneurPulse.com.au  Fact Sheet for Healthcare Providers: IncredibleEmployment.be  This test is not yet approved or cleared by the [place] FDA and has been authorized for detection and/or diagnosis of SARS-CoV-2 by FDA under an Emergency Use Authorization (EUA). This EUA will remain in effect (meaning this test can be used) for the duration of the COVID-19 declaration under Section 564(b)(1) of the Act, 21 U.S.C. section 360bbb-3(b)(1), unless the authorization is terminated or revoked.  Performed at [facility], [place] [address] [place], [place] [address]      Time coordinating discharge: 35 minutes  SIGNED:   Aline August, MD  [facility] 04/18/2020, 10:34 AM

## 2020-04-18 NOTE — Care Management Important Message (Signed)
Important Message  Patient Details IM Letter given to the Patient. Name: Jodi Kaufman MRN: [id number] Date of Birth: [birth year]   Medicare Important Message Given:  Yes     Kerin Salen 04/18/2020, 11:36 AM

## 2020-04-18 NOTE — Progress Notes (Signed)
Physical Therapy Treatment Patient Details Name: Jodi Kaufman MRN: [id number] DOB: 05/06/1940 Today's Date: 04/18/2020    History of Present Illness 79 y.o. female with medical history significant of metastatic breast cancer, vitamin D deficiency, mild emphysema, generalized anxiety disorder, hyperlipidemia and GERD came to [facility] ED with complaint of nausea, vomiting and poor p.o. intake. Patient was discharged several weeks ago from [facility] in [place]. Pt with metastatic breast cancer, with mets to ribs, spine, pelvis, s/p treatment for spinal mets. s/p g tube placement 12/20.    PT Comments    Pt found in bed incont urine.  Assisted OOB to bathroom.  General transfer comment: Supervision for safety assisted to bathroom pt able to self perform hygiene.  Slightly unsteady.  Utilizes sink and wall rail to steady self.  Has increased difficulty rises from lower level toilet height. General Gait Details: amb to bathroom with walker slightly unsteady but safely relying on walker for stability.  Then amb a great distance in hallway.  "I like this walker" pt stated she had her Mother's 4WW "just in case" but that walker is in [place].  "I need to do more of this" walking stated pt. Pt plans to D/C to daughters home  Follow Up Recommendations  Home health PT;Supervision/Assistance - 24 hour     Equipment Recommendations  Rolling walker with 5" wheels    Recommendations for Other Services       Precautions / Restrictions Precautions Precautions: Fall Precaution Comments: spinal mets    Mobility  Bed Mobility Overal bed mobility: Needs Assistance Bed Mobility: Supine to Sit;Sit to Supine     Supine to sit: Supervision Sit to supine: Supervision   General bed mobility comments: self able with increased time  Transfers Overall transfer level: Needs assistance Equipment used: Rolling walker (2 wheeled) Transfers: Sit to/from [facility] Sit to Stand:  Supervision Stand pivot transfers: Supervision       General transfer comment: Supervision for safety assisted to bathroom pt able to self perform hygiene.  Slightly unsteady.  Utilizes sink and wall rail to steady self.  Has increased difficulty rises from lower level toilet height.  Ambulation/Gait Ambulation/Gait assistance: Supervision;Min guard Gait Distance (Feet): 175 Feet Assistive device: Rolling walker (2 wheeled) Gait Pattern/deviations: Step-through pattern;Decreased stride length;Trunk flexed Gait velocity: decr   General Gait Details: amb to bathroom with walker slightly unsteady but safely relying on walker for stability.  Then amb a great distance in hallway.  "I like this walker" pt stated she had her Mother's 4WW "just in case" but that walker is in [place].  "I need to do more of this" walking stated pt.   Stairs             Wheelchair Mobility    Modified Rankin (Stroke Patients Only)       Balance                                            Cognition Arousal/Alertness: Awake/alert Behavior During Therapy: WFL for tasks assessed/performed Overall Cognitive Status: Within Functional Limits for tasks assessed                                 General Comments: AxO x 3 with anxiety and admits to being a "Germa Phobe" can easily get frustrated but  through all that a very pleasant, independent Psychiatric nurse Comments        Pertinent Vitals/Pain Pain Assessment: No/denies pain    Home Living                      Prior Function            PT Goals (current goals can now be found in the care plan section) Progress towards PT goals: Progressing toward goals    Frequency    Min 3X/week      PT Plan Current plan remains appropriate    Co-evaluation              AM-PAC PT "6 Clicks" Mobility   Outcome Measure  Help needed turning from your back to your side while in a  flat bed without using bedrails?: None Help needed moving from lying on your back to sitting on the side of a flat bed without using bedrails?: None Help needed moving to and from a bed to a chair (including a wheelchair)?: None Help needed standing up from a chair using your arms (e.g., wheelchair or bedside chair)?: None Help needed to walk in hospital room?: A Little Help needed climbing 3-5 steps with a railing? : A Little 6 Click Score: 22    End of Session Equipment Utilized During Treatment: Gait belt Activity Tolerance: Patient tolerated treatment well Patient left: in bed;with bed alarm set;with call bell/phone within reach Nurse Communication: Mobility status PT Visit Diagnosis: Other abnormalities of gait and mobility (R26.89)     Time: 2423-5361 PT Time Calculation (min) (ACUTE ONLY): 32 min  Charges:  $Gait Training: 8-22 mins $Therapeutic Activity: 8-22 mins                     Rica Koyanagi  PTA Acute  Rehabilitation Services Pager      [phone] Office      [phone]

## 2020-04-22 ENCOUNTER — Telehealth: Payer: Self-pay

## 2020-04-22 NOTE — Telephone Encounter (Signed)
Spoke with patient & patient's daughter Arville Go and scheduled an in-person Palliative Consult for 05/08/20 @ 3PM   COVID screening was negative. Patient has a cat in the home. Patient lives with daughter   Consent obtained; updated  *Patient is also receiving home care services with [facility]*

## 2020-04-22 NOTE — Telephone Encounter (Signed)
Patient is approved for Ibrance from [employer] at no cost 04/22/20-04/26/20.

## 2020-04-23 ENCOUNTER — Telehealth: Payer: Self-pay | Admitting: *Deleted

## 2020-04-23 ENCOUNTER — Telehealth: Payer: Self-pay | Admitting: Hematology and Oncology

## 2020-04-23 NOTE — Telephone Encounter (Signed)
Unable to leave a message with rescheduled appointment due to voicemail full. Mailed schedule with option to call back to reschedule if needed.

## 2020-04-23 NOTE — Telephone Encounter (Signed)
Received VM from pt daughter Chyrl Civatte stating pt is experiencing constipation and is not able to use miralax with G tube.  Requesting if pt is able to take milk of magnesium.  RN attempt to return call, no answer, LVM to return call to the office.

## 2020-04-23 NOTE — Telephone Encounter (Signed)
Oral Chemotherapy Pharmacist Encounter  I spoke with patient's daughter, Jodi Kaufman, for overview of: Jodi Kaufman for the treatment of metastatic, hormone-receptor positive, HER2 receptor negative breast cancer, in combination with letrozole, planned duration until disease progression or unacceptable toxicity.   Counseled on administration, dosing, side effects, monitoring, drug-food interactions, safe handling, storage, and disposal.  Patient will take Ibrance $RemoveBefo'100mg'arDUNELUhwf$  tablets, 1 tablet by mouth once daily, with or without food, taken for 3 weeks on, 1 week off, and repeated.  Discussed step by step directions with patient's daughter regarding creating an Ibrance suspension since patient is unable to swallow pills. Daughter voiced understanding. Instructions for suspension placed in mail for family as well.    Family knows that patient needs to avoid grapefruit and grapefruit juice while on treatment with Ibrance.  Patient is taking letrozole 2.5 mg by mouth once daily.  Ibrance start date: 04/27/2020  Adverse effects include but are not limited to: fatigue, hair loss, GI upset, nausea, decreased blood counts, and increased upper respiratory infections.  Severe, life-threatening, and/or fatal interstitial lung disease (ILD) and/or pneumonitis may occur with CDK 4/6 inhibitors.  Reminded family of WBC check on Cycle 1 Day 14 for dose and ANC assessment. Patient currently has f/u with MD scheduled on 04/29/20.  Reviewed importance of keeping a medication schedule and plan for any missed doses. No barriers to medication adherence identified.  Medication reconciliation performed and medication/allergy list updated. Daughter stated that patient is not taking the pantoprazole solution due to medication not being covered by insurance and out of pocket expense being unaffordable.   Patient approved for manufacturer assistance through [employer] together since out of pocket copay was unaffordable for  patient. Jodi Kaufman was delivered to patient on 04/23/20.  All questions answered.  Jodi Kaufman voiced understanding and appreciation.   Medication education handout and patient medication calendar placed in mail for patient. Family knows to call the office with questions or concerns. Oral Chemotherapy Clinic phone number provided to patient.   Jodi Kaufman, PharmD, BCPS Hematology/Oncology Clinical Pharmacist [facility] [phone] 04/23/2020 12:55 PM

## 2020-04-29 ENCOUNTER — Inpatient Hospital Stay: Payer: Medicare Other

## 2020-04-29 ENCOUNTER — Inpatient Hospital Stay: Payer: Medicare Other | Admitting: Hematology and Oncology

## 2020-05-03 DIAGNOSIS — Z431 Encounter for attention to gastrostomy: Secondary | ICD-10-CM

## 2020-05-03 DIAGNOSIS — C50919 Malignant neoplasm of unspecified site of unspecified female breast: Secondary | ICD-10-CM

## 2020-05-03 DIAGNOSIS — C7951 Secondary malignant neoplasm of bone: Secondary | ICD-10-CM

## 2020-05-06 ENCOUNTER — Telehealth: Payer: Self-pay

## 2020-05-06 ENCOUNTER — Telehealth: Payer: Self-pay | Admitting: Family Medicine

## 2020-05-06 NOTE — Telephone Encounter (Signed)
Called and gave verbal order to [place].

## 2020-05-06 NOTE — Telephone Encounter (Signed)
OK for verbal order for the services below.

## 2020-05-06 NOTE — Telephone Encounter (Signed)
Oral Oncology Patient Advocate Encounter   Was successful in securing patient an $52 grant from Patient [facility]) to provide copayment coverage for Ibrance.  This will keep the out of pocket expense at $0.     I have spoken with the patient.    The billing information is as follows and has been shared with [place].   Member ID: [id number] Group ID: [id number] RxBin: [id number] Dates of Eligibility: 02/03/20 through 05/02/21  Fund:  Metastatic Breast

## 2020-05-06 NOTE — Telephone Encounter (Signed)
Sharyn Lull w/[facility] called to get verbal order for OT.  She would like to do OT as follows:   two times a week for 3 weeks and 1 time a week for 2 weeks.  If OK with orders please call Sharyn Lull at [phone] to give verbal order.

## 2020-05-06 NOTE — Telephone Encounter (Signed)
Oral Oncology Patient Advocate Encounter   Was successful in securing patient a $6000 grant from [employer] to provide copayment coverage for Ibrance.  This will keep the out of pocket expense at $0.        The billing information is as follows and has been shared with [place].   Member ID: [id number] Group ID: [id number] RxBin: [id number] PCN: [id number] Dates of Eligibility: 05/03/20 through 05/03/21  Fund name:  Metastatic Breast  [place] Patient [place] Phone [phone] Fax [phone] 05/06/2020 2:02 PM

## 2020-05-08 ENCOUNTER — Other Ambulatory Visit: Payer: Self-pay

## 2020-05-08 ENCOUNTER — Other Ambulatory Visit: Payer: Medicare Other | Admitting: Nurse Practitioner

## 2020-05-09 NOTE — Progress Notes (Signed)
Patient Care Team: Hali Marry, MD as PCP - General (Family Medicine)  DIAGNOSIS:    ICD-10-CM   1. Metastatic breast cancer ([place])  C50.919     SUMMARY OF ONCOLOGIC HISTORY: Oncology History  Metastatic breast cancer ([place])  1995 Initial Biopsy   Right breast cancer stage IIb ER positive IDC s/p mastectomy and 2 months of tamoxifen, lost to follow-up (treatment at [facility])   03/12/2020 - 03/26/2020 Radiation Therapy   Palliative radiation to the brain and spine for brain metastases   03/14/2020 Initial Diagnosis   Scans on 03/14/2020: Multiple bone metastases and brain metastases, biopsy of the rib: Breast cancer ER 80% PR negative HER-2 negative.  Moved to [facility] December 2021 to be closer to her family     CHIEF COMPLIANT: Follow-up of recent hospitalization   INTERVAL HISTORY: Jodi Kaufman is a 80 y.o. with above-mentioned history of metastatic breast cancer. She was admitted from 04/10/20-04/18/20 after presenting to the ED with nausea, vomiting and poor oral intake. She presents to the clinic today for follow-up of her hospitalization and to discuss future treatment.  Her nutrition has improved markedly with tube feeds.  Her daughter is helping her tremendously.  Her energy levels are better, her appetite is better and she is also eating slightly.  ALLERGIES:  is allergic to atorvastatin, clavulanic acid, and tamoxifen.  MEDICATIONS:  Current Outpatient Medications  Medication Sig Dispense Refill   letrozole (FEMARA) 2.5 MG tablet Take 1 tablet (2.5 mg total) by mouth daily. Per tube     Nutritional Supplements (FEEDING SUPPLEMENT, OSMOLITE 1.5 CAL,) LIQD Place 120 mLs into feeding tube 5 (five) times daily. Advance to 262m 5 times a day if tolerated 1000 mL 20   ondansetron (ZOFRAN) 4 MG tablet Place 1 tablet (4 mg total) into feeding tube every 6 (six) hours as needed for nausea. (Patient not taking: Reported on 04/23/2020) 20 tablet 0    oxyCODONE (OXY IR/ROXICODONE) 5 MG immediate release tablet Take 2.5 mg by mouth every 4 (four) hours as needed for severe pain. (Patient not taking: Reported on 04/23/2020)     palbociclib (IBRANCE) 100 MG tablet Take 1 tablet (100 mg total) by mouth daily. Take for 21 days on, 7 days off, repeat every 28 days. Per tube     pantoprazole sodium (PROTONIX) 40 mg/20 mL PACK Place 20 mLs (40 mg total) into feeding tube daily. (Patient not taking: Reported on 04/23/2020) 600 mL 0   polyethylene glycol (MIRALAX / GLYCOLAX) 17 g packet Place 17 g into feeding tube daily as needed for mild constipation. (Patient not taking: Reported on 04/23/2020) 14 each 0   No current facility-administered medications for this visit.    PHYSICAL EXAMINATION: ECOG PERFORMANCE STATUS: 2 - Symptomatic, <50% confined to bed  There were no vitals filed for this visit. There were no vitals filed for this visit.  LABORATORY DATA:  I have reviewed the data as listed CMP Latest Ref Rng & Units 04/18/2020 04/16/2020 04/14/2020  Glucose 70 - 99 mg/dL 137(H) 80 100(H)  BUN 8 - 23 mg/dL 12 7(L) 11  Creatinine 0.44 - 1.00 mg/dL 0.52 0.37(L) 0.57  Sodium 135 - 145 mmol/L 135 136 140  Potassium 3.5 - 5.1 mmol/L 4.2 3.1(L) 3.9  Chloride 98 - 111 mmol/L 102 101 106  CO2 22 - 32 mmol/L _0 Calcium 8.9 - 10.3 mg/dL 8.3(L) 8.3(L) 8.4(L)  Total Protein 6.5 - 8.1 g/dL - 4.5(L) 4.8(L)  Total Bilirubin  0.3 - 1.2 mg/dL - 1.2 1.0  Alkaline Phos 38 - 126 U/L - 504(H) 557(H)  AST 15 - 41 U/L - 22 46(H)  ALT 0 - 44 U/L - 37 79(H)    Lab Results  Component Value Date   WBC 7.3 05/10/2020   HGB 14.2 05/10/2020   HCT 44.0 05/10/2020   MCV 94.6 05/10/2020   PLT 345 05/10/2020   NEUTROABS 6.0 05/10/2020    ASSESSMENT & PLAN:  Metastatic breast cancer ([place]) Metastatic breast cancer: Originally diagnosed in 1995, patient did not take prescribed tamoxifen. Now presenting with metastatic disease based on scans done  03/11/2020 CT CAP: Numerous lytic lesions in the thoracic spine several with expansion extraosseous extension. 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  Uncertain if any radiation was given to the skull lesions.  Current Treatment:   letrozole. Started December 2021 Letrozole toxicities: Denies any side effects to letrozole  Poor nutritional status: Since the tube feedings have started her weight is improving.  Energy levels are improving and she is got a better positive outlook. She wants to hold off on starting Ibrance for couple more months.  Our plan is to perform scans and then follow-up in 2 months.  If there is any concerns then we can add Ibrance to her treatment. RTC in 2 months with scans and follow-up after that.    No orders of the defined types were placed in this encounter.  The patient has a good understanding of the overall plan. she agrees with it. she will call with any problems that may develop before the next visit here.  Total time spent: 30 mins including face to face time and time spent for planning, charting and coordination of care  Nicholas Lose, MD 05/10/2020  I, Cloyde Reams Dorshimer, am acting as scribe for Dr. Nicholas Lose.  I have reviewed the above documentation for accuracy and completeness, and I agree with the above.

## 2020-05-10 ENCOUNTER — Inpatient Hospital Stay: Payer: Medicare Other | Attending: Hematology and Oncology

## 2020-05-10 ENCOUNTER — Other Ambulatory Visit: Payer: Self-pay

## 2020-05-10 ENCOUNTER — Inpatient Hospital Stay (HOSPITAL_BASED_OUTPATIENT_CLINIC_OR_DEPARTMENT_OTHER): Payer: Medicare Other | Admitting: Hematology and Oncology

## 2020-05-10 DIAGNOSIS — Z79899 Other long term (current) drug therapy: Secondary | ICD-10-CM | POA: Diagnosis not present

## 2020-05-10 DIAGNOSIS — Z853 Personal history of malignant neoplasm of breast: Secondary | ICD-10-CM | POA: Insufficient documentation

## 2020-05-10 DIAGNOSIS — C50919 Malignant neoplasm of unspecified site of unspecified female breast: Secondary | ICD-10-CM | POA: Diagnosis not present

## 2020-05-10 DIAGNOSIS — C7951 Secondary malignant neoplasm of bone: Secondary | ICD-10-CM | POA: Diagnosis present

## 2020-05-10 DIAGNOSIS — Z79811 Long term (current) use of aromatase inhibitors: Secondary | ICD-10-CM | POA: Insufficient documentation

## 2020-05-10 DIAGNOSIS — Z9221 Personal history of antineoplastic chemotherapy: Secondary | ICD-10-CM | POA: Diagnosis not present

## 2020-05-10 DIAGNOSIS — Z923 Personal history of irradiation: Secondary | ICD-10-CM | POA: Insufficient documentation

## 2020-05-10 LAB — MAGNESIUM: Magnesium: 1.9 mg/dL (ref 1.7–2.4)

## 2020-05-10 LAB — CMP (CANCER CENTER ONLY)
ALT: 18 U/L (ref 0–44)
AST: 24 U/L (ref 15–41)
Albumin: 3 g/dL — ABNORMAL LOW (ref 3.5–5.0)
Alkaline Phosphatase: 390 U/L — ABNORMAL HIGH (ref 38–126)
Anion gap: 6 (ref 5–15)
BUN: 16 mg/dL (ref 8–23)
CO2: 29 mmol/L (ref 22–32)
Calcium: 9.2 mg/dL (ref 8.9–10.3)
Chloride: 103 mmol/L (ref 98–111)
Creatinine: 0.62 mg/dL (ref 0.44–1.00)
GFR, Estimated: >60 mL/min (ref >60)
Glucose, Bld: 128 mg/dL — ABNORMAL HIGH (ref 70–99)
Potassium: 4.2 mmol/L (ref 3.5–5.1)
Sodium: 138 mmol/L (ref 135–145)
Total Bilirubin: 0.6 mg/dL (ref 0.3–1.2)
Total Protein: 6.4 g/dL — ABNORMAL LOW (ref 6.5–8.1)

## 2020-05-10 LAB — CBC WITH DIFFERENTIAL (CANCER CENTER ONLY)
Abs Immature Granulocytes: 0.02 10*3/uL (ref 0.00–0.07)
Basophils Absolute: 0.1 10*3/uL (ref 0.0–0.1)
Basophils Relative: 1 %
Eosinophils Absolute: 0.1 10*3/uL (ref 0.0–0.5)
Eosinophils Relative: 1 %
HCT: 44 % (ref 36.0–46.0)
Hemoglobin: 14.2 g/dL (ref 12.0–15.0)
Immature Granulocytes: 0 %
Lymphocytes Relative: 6 %
Lymphs Abs: 0.4 10*3/uL — ABNORMAL LOW (ref 0.7–4.0)
MCH: 30.5 pg (ref 26.0–34.0)
MCHC: 32.3 g/dL (ref 30.0–36.0)
MCV: 94.6 fL (ref 80.0–100.0)
Monocytes Absolute: 0.7 10*3/uL (ref 0.1–1.0)
Monocytes Relative: 10 %
Neutro Abs: 6 10*3/uL (ref 1.7–7.7)
Neutrophils Relative %: 82 %
Platelet Count: 345 10*3/uL (ref 150–400)
RBC: 4.65 MIL/uL (ref 3.87–5.11)
RDW: 18.9 % — ABNORMAL HIGH (ref 11.5–15.5)
WBC Count: 7.3 10*3/uL (ref 4.0–10.5)
nRBC: 0 % (ref 0.0–0.2)

## 2020-05-10 NOTE — Assessment & Plan Note (Signed)
Metastatic breast cancer: Originally diagnosed in 1995, patient did not take prescribed tamoxifen. Now presenting with metastatic disease based on scans done 03/11/2020 CT CAP: Numerous lytic lesions in the thoracic spine several with expansion extraosseous extension. 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  Uncertain if any radiation was given to the skull lesions.  Current Treatment: Ibrance (100 mg) with letrozole. Started  Ibrance toxicities:   RTC in 1 month with labs.

## 2020-05-14 ENCOUNTER — Ambulatory Visit: Payer: Medicare Other | Admitting: Family Medicine

## 2020-05-16 ENCOUNTER — Telehealth (INDEPENDENT_AMBULATORY_CARE_PROVIDER_SITE_OTHER): Payer: Medicare Other | Admitting: Family Medicine

## 2020-05-16 ENCOUNTER — Encounter: Payer: Self-pay | Admitting: Family Medicine

## 2020-05-16 DIAGNOSIS — Z23 Encounter for immunization: Secondary | ICD-10-CM

## 2020-05-16 DIAGNOSIS — E43 Unspecified severe protein-calorie malnutrition: Secondary | ICD-10-CM

## 2020-05-16 MED ORDER — DOXYCYCLINE HYCLATE 100 MG PO TABS
100.0000 mg | ORAL_TABLET | Freq: Two times a day (BID) | ORAL | 0 refills | Status: DC
Start: 2020-05-16 — End: 2020-09-02

## 2020-05-16 NOTE — Progress Notes (Signed)
She reports taht she has been experiencing congestion for 2.5 months being in and out of the hospital.   She said that the green mucus started 6 days ago. she gets choked on the mucus when she tries to cough it up.   Denies f/s/c/n/v no body or headaches  Diarrhea last episode was yesterday-watery  She has not taken any OTC medications due to the Cancer coming back and not being sure as to what she can take.

## 2020-05-16 NOTE — Progress Notes (Signed)
Virtual Visit via Video Note  I connected with [place] on 05/16/20 at  3:20 PM EST by a video enabled telemedicine application and verified that I am speaking with the correct person using two identifiers.   I discussed the limitations of evaluation and management by telemedicine and the availability of in person appointments. The patient expressed understanding and agreed to proceed.  Patient location: at home, daughter Mechele Claude was present as well.  Provider location: in office  Subjective:    CC: Chest congestion.  HPI:  She reports taht she has been experiencing  Chest congestion for 2.5 months being in and out of the hospital.   She unfortunately was diagnosed with metastatic breast cancer and was hospitalized for almost 2 weeks in [place] and received multiple radiation treatments.  She is now currently on letrozole.  She will be starting Ibrance soon.  She said that the green stringy mucus started 6 days ago. She gets choked on the mucus when she tries to cough it up. Denies f/s/c/n/v no body or headaches.  She feels it is coming from her chest.  No SOB.  No chest pain.  Maybe a little reflux. Diarrhea last episode was yesterday-watery  She has not taken any OTC medications due to the Cancer coming back and not being sure as to what she can take.   She has a feeding tube and she has now gained about 6.5 lbs. She is feeling better.   Using Tylenol for pain.   Past medical history, Surgical history, Family history not pertinant except as noted below, Social history, Allergies, and medications have been entered into the medical record, reviewed, and corrections made.   Review of Systems: No fevers, chills, night sweats, weight loss, chest pain, or shortness of breath.   Objective:    General: Speaking clearly in complete sentences without any shortness of breath.  Alert and oriented x3.  Normal judgment. No apparent acute distress.    Impression and Recommendations:     Protein-calorie malnutrition, severe She has been gaining weight since starting tube fees.  She is feeling better.    Chest congestion x2 months with thick green mucus-we discussed trying to run a humidifier, keeping the passages well moisturized.  Staying well-hydrated.  This is to keep the secretions little bit more thin and she really does not want to use any over-the-counter's.  We will treat with doxycycline.  If she is not feeling tremendously better please let us know.  Does not sound like she is having a lot of sinus symptoms which is great.  Also had questions about getting the COVID-vaccine I strongly encouraged her to get vaccinated.   Time spent in encounter 22 minutes  I discussed the assessment and treatment plan with the patient. The patient was provided an opportunity to ask questions and all were answered. The patient agreed with the plan and demonstrated an understanding of the instructions.   The patient was advised to call back or seek an in-person evaluation if the symptoms worsen or if the condition fails to improve as anticipated.   [name], MD

## 2020-05-16 NOTE — Assessment & Plan Note (Signed)
She has been gaining weight since starting tube fees.  She is feeling better.

## 2020-05-27 ENCOUNTER — Ambulatory Visit: Payer: Medicare Other | Admitting: Family Medicine

## 2020-05-28 ENCOUNTER — Ambulatory Visit: Payer: Medicare Other | Admitting: Family Medicine

## 2020-06-11 ENCOUNTER — Telehealth: Payer: Self-pay

## 2020-06-11 NOTE — Telephone Encounter (Signed)
Nutrition Assessment   Reason for Assessment:  Referral from inpatient RD team regarding new feeding tube placement   ASSESSMENT:  80 year old female with metastatic breast cancer.  Ibrance on hold.  Noted PEG placement on 04/15/2020.  Past medical history of GERD, HLD, severe protein calorie malnutrition. Followed by Dr Lindi Adie.  Spoke with patient via phone. Patient reports that she has been taking 4.5-5 cartons of osmolite 1.5 daily via feeding tube (mostly 4.5 cartons).  Flushing with water before and mixing small amount of water in feeding and flush after (~ 166ml total) at each feeding.  Feeding at 7am, 10am, 1pm, 4pm and 7 pm.  Patient denies nausea currently. Bowels are moving at least 1 time per day sometimes 2. Takes miralax every other day.  Drinking liquids orally but only taking few bites of food. Reports that she has excess mucus production that gags her and does not allow her to eat.    Reports that her daughter orders her formula and supplies.   Medications: reviewed   Labs: reviewed   Anthropometrics:   Height: 66 inches Weight: 110 lb 6.4 oz Increased from 104 lb on 12/6 129 lb on 11/09/2019 BMI: 17   Estimated Energy Needs  Kcals: 1500-1750 Protein: 75-88 g Fluid: 1.5 L   NUTRITION DIAGNOSIS: Inadequate oral intake related to cancer and cancer related treatment side effects as evidenced by poor po intake, weight loss initially, resulting in PEG placement   INTERVENTION:  Continue 4.5-5 cartons of osmolite 1.5 daily via feeding tube.  Flush with at least 48ml of water before and 46ml after at each feeding.   Provides 1775 calories, 75 g protein, 1540ml free water Eat and drink orally as able. Contact information provided   MONITORING, EVALUATION, GOAL: weight trends, intake, tube feeding   Next Visit: March 22 phone visit  Tajanay Hurley B. Zenia Resides, [place], [place] Registered Dietitian [phone] (mobile)

## 2020-06-14 ENCOUNTER — Telehealth: Payer: Self-pay

## 2020-06-14 NOTE — Telephone Encounter (Signed)
Attempted to contact patient's daughter Arville Go to reschedule a Palliative Care consult appointment. No answer left a message to return call.

## 2020-06-28 ENCOUNTER — Telehealth: Payer: Self-pay

## 2020-06-28 ENCOUNTER — Other Ambulatory Visit: Payer: Self-pay

## 2020-06-28 DIAGNOSIS — C50919 Malignant neoplasm of unspecified site of unspecified female breast: Secondary | ICD-10-CM

## 2020-06-28 MED ORDER — LORAZEPAM 0.5 MG PO TABS: ORAL_TABLET | ORAL | 0 refills | Status: DC

## 2020-06-28 NOTE — Telephone Encounter (Signed)
RN placed call X 2 to inform patient about new Rx being sent prior to CT scan due to anxiety.    No answer, voicemail is full.  RN will attempt again at a later time.

## 2020-06-28 NOTE — Telephone Encounter (Signed)
Patient is approved for Ibrance at no cost from [employer] 06/28/20-04/26/21  Patient must send a denial letter from [facility] for 2023 re-enrollment  [place] Patient [place] Phone [phone] Fax [phone] 06/28/2020 2:05 PM

## 2020-06-28 NOTE — Progress Notes (Signed)
RN spoke with patient and patient's daughter confirmed instructions prior to CT scan.    Both are aware to be NPO 4 hours prior to CT scan.  Pt is scheduled for labs on 3/11 and will pick up contrast on this date.    RN confirmed with CT department that contrast can be done through G-Tube.  RN updated daughter with information, no further needs at this time.

## 2020-07-05 ENCOUNTER — Other Ambulatory Visit: Payer: Self-pay

## 2020-07-05 ENCOUNTER — Inpatient Hospital Stay: Payer: Medicare Other | Attending: Hematology and Oncology

## 2020-07-05 DIAGNOSIS — Z923 Personal history of irradiation: Secondary | ICD-10-CM | POA: Diagnosis not present

## 2020-07-05 DIAGNOSIS — C7951 Secondary malignant neoplasm of bone: Secondary | ICD-10-CM | POA: Diagnosis not present

## 2020-07-05 DIAGNOSIS — Z170 Estrogen receptor positive status [ER+]: Secondary | ICD-10-CM | POA: Diagnosis not present

## 2020-07-05 DIAGNOSIS — C50919 Malignant neoplasm of unspecified site of unspecified female breast: Secondary | ICD-10-CM

## 2020-07-05 DIAGNOSIS — Z853 Personal history of malignant neoplasm of breast: Secondary | ICD-10-CM | POA: Diagnosis present

## 2020-07-05 DIAGNOSIS — Z79899 Other long term (current) drug therapy: Secondary | ICD-10-CM | POA: Insufficient documentation

## 2020-07-05 LAB — CMP (CANCER CENTER ONLY)
ALT: 21 U/L (ref 0–44)
AST: 21 U/L (ref 15–41)
Albumin: 3.3 g/dL — ABNORMAL LOW (ref 3.5–5.0)
Alkaline Phosphatase: 193 U/L — ABNORMAL HIGH (ref 38–126)
Anion gap: 6 (ref 5–15)
BUN: 22 mg/dL (ref 8–23)
CO2: 26 mmol/L (ref 22–32)
Calcium: 9.2 mg/dL (ref 8.9–10.3)
Chloride: 105 mmol/L (ref 98–111)
Creatinine: 0.69 mg/dL (ref 0.44–1.00)
GFR, Estimated: >60 mL/min (ref >60)
Glucose, Bld: 123 mg/dL — ABNORMAL HIGH (ref 70–99)
Potassium: 4.1 mmol/L (ref 3.5–5.1)
Sodium: 137 mmol/L (ref 135–145)
Total Bilirubin: 0.6 mg/dL (ref 0.3–1.2)
Total Protein: 6.1 g/dL — ABNORMAL LOW (ref 6.5–8.1)

## 2020-07-11 ENCOUNTER — Telehealth: Payer: Self-pay | Admitting: *Deleted

## 2020-07-11 NOTE — Telephone Encounter (Signed)
Received VM from pt.  Attempt x1 to return call.  No answer, LVM to return call to the office.  

## 2020-07-12 ENCOUNTER — Ambulatory Visit (HOSPITAL_COMMUNITY)
Admission: RE | Admit: 2020-07-12 | Discharge: 2020-07-12 | Disposition: A | Discharge status: Home / Self Care | Payer: Medicare Other | Source: Ambulatory Visit | Attending: Hematology and Oncology | Admitting: Hematology and Oncology

## 2020-07-12 ENCOUNTER — Other Ambulatory Visit: Payer: Self-pay

## 2020-07-12 ENCOUNTER — Encounter (HOSPITAL_COMMUNITY): Payer: Self-pay

## 2020-07-12 DIAGNOSIS — J90 Pleural effusion, not elsewhere classified: Secondary | ICD-10-CM | POA: Diagnosis not present

## 2020-07-12 DIAGNOSIS — M899 Disorder of bone, unspecified: Secondary | ICD-10-CM | POA: Insufficient documentation

## 2020-07-12 DIAGNOSIS — C50919 Malignant neoplasm of unspecified site of unspecified female breast: Secondary | ICD-10-CM | POA: Diagnosis present

## 2020-07-12 DIAGNOSIS — R222 Localized swelling, mass and lump, trunk: Secondary | ICD-10-CM | POA: Diagnosis not present

## 2020-07-12 DIAGNOSIS — J9811 Atelectasis: Secondary | ICD-10-CM | POA: Insufficient documentation

## 2020-07-12 MED ORDER — IOHEXOL 300 MG/ML  SOLN
75.0000 mL | Freq: Once | INTRAMUSCULAR | Status: AC | PRN
  Administered 2020-07-12: 75 mL via INTRAVENOUS

## 2020-07-14 NOTE — Progress Notes (Signed)
Patient Care Team: Hali Marry, MD as PCP - General (Family Medicine)  DIAGNOSIS:    ICD-10-CM   1. Metastatic breast cancer ([place])  C50.919     SUMMARY OF ONCOLOGIC HISTORY: Oncology History  Metastatic breast cancer ([place])  1995 Initial Biopsy   Right breast cancer stage IIb ER positive IDC s/p mastectomy and 2 months of tamoxifen, lost to follow-up (treatment at [facility])   03/12/2020 - 03/26/2020 Radiation Therapy   Palliative radiation to the brain and spine for brain metastases   03/14/2020 Initial Diagnosis   Scans on 03/14/2020: Multiple bone metastases and brain metastases, biopsy of the rib: Breast cancer ER 80% PR negative HER-2 negative.  Moved to [facility] December 2021 to be closer to her family     CHIEF COMPLIANT: Follow-up of metastatic breast cancer   INTERVAL HISTORY: Jodi Kaufman is a 80 y.o. with above-mentioned history of metastatic breast cancer. CT CAP on 07/12/20 showed a large expansile mass at the right second rib, 5.0cm, with a smaller mass at the right eighth rib, and no evidence of pulmonary metastases. She presents to the clinic today for follow-up.   She reports to me that she is gaining weight and her nutritional status is improving.  Overall she may have gained about 15 to 20 pounds.  She however still looks relatively frail and is wheelchair-bound.  ALLERGIES:  is allergic to atorvastatin, clavulanic acid, and tamoxifen.  MEDICATIONS:  Current Outpatient Medications  Medication Sig Dispense Refill  . doxycycline (VIBRA-TABS) 100 MG tablet Take 1 tablet (100 mg total) by mouth 2 (two) times daily. 20 tablet 0  . letrozole (FEMARA) 2.5 MG tablet Take 1 tablet (2.5 mg total) by mouth daily. Per tube    . LORazepam (ATIVAN) 0.5 MG tablet Take 1 tablet (0.$RemoveBef'5mg'GFAQCCKlwB$ ) by mouth 1 hour prior to imaging.  May repeat X 1 if needed. 2 tablet 0  . Nutritional Supplements (FEEDING SUPPLEMENT, OSMOLITE 1.5 CAL,) LIQD Place 120 mLs into feeding  tube 5 (five) times daily. Advance to 253ml 5 times a day if tolerated 1000 mL 20  . palbociclib (IBRANCE) 100 MG tablet Take 1 tablet (100 mg total) by mouth daily. Take for 21 days on, 7 days off, repeat every 28 days. Per tube     No current facility-administered medications for this visit.    PHYSICAL EXAMINATION: ECOG PERFORMANCE STATUS: 1 - Symptomatic but completely ambulatory  Vitals:   07/15/20 1523  BP: 112/68  Pulse: 95  Resp: 18  Temp: 97.9 F (36.6 C)  SpO2: 98%   Filed Weights   07/15/20 1523  Weight: 120 lb 8 oz (54.7 kg)    LABORATORY DATA:  I have reviewed the data as listed CMP Latest Ref Rng & Units 07/05/2020 05/10/2020 04/18/2020  Glucose 70 - 99 mg/dL 123(H) 128(H) 137(H)  BUN 8 - 23 mg/dL $Remove'22 16 12  'cSuusIR$ Creatinine 0.44 - 1.00 mg/dL 0.69 0.62 0.52  Sodium 135 - 145 mmol/L 137 138 135  Potassium 3.5 - 5.1 mmol/L 4.1 4.2 4.2  Chloride 98 - 111 mmol/L 105 103 102  CO2 22 - 32 mmol/L $RemoveB'26 29 23  'mEOooDmd$ Calcium 8.9 - 10.3 mg/dL 9.2 9.2 8.3(L)  Total Protein 6.5 - 8.1 g/dL 6.1(L) 6.4(L) -  Total Bilirubin 0.3 - 1.2 mg/dL 0.6 0.6 -  Alkaline Phos 38 - 126 U/L 193(H) 390(H) -  AST 15 - 41 U/L 21 24 -  ALT 0 - 44 U/L 21 18 -  Lab Results  Component Value Date   WBC 7.3 05/10/2020   HGB 14.2 05/10/2020   HCT 44.0 05/10/2020   MCV 94.6 05/10/2020   PLT 345 05/10/2020   NEUTROABS 6.0 05/10/2020    ASSESSMENT & PLAN:  Metastatic breast cancer ([place]) Metastatic breast cancer: Originally diagnosed in 1995, patient did not take prescribed tamoxifen. Now presenting with metastatic disease based on scans done 03/11/2020 CT CAP: Numerous lytic lesions in the thoracic spine several with expansion extraosseous extension. 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 Uncertain if any radiation was given to  the skull lesions.  Current Treatment:   letrozole. Started December 2021 Letrozole toxicities: Denies any side effects to letrozole  Poor nutritional status: Since the tube feedings have started her weight is improving.  Energy levels are improving and she is got a better positive outlook.    CT CAP 07/12/2020: Large expansile mass originating in the right second rib 5 cm, smaller expansile mass right lateral eighth rib 3 cm.  Multiple additional sclerotic lesions in spine and ribs and pelvis (expansile lesion 4.4 cm)  I recommended that we continue with the letrozole for another 3 months. We will perform CT chest abdomen pelvis for further assessment in 3 months. Follow-up after the scans to discuss results and then to discuss if we need to initiate therapy with Ibrance. I discussed the pros and cons and toxicities of Ibrance in detail.   No orders of the defined types were placed in this encounter.  The patient has a good understanding of the overall plan. she agrees with it. she will call with any problems that may develop before the next visit here.  Total time spent: 30 mins including face to face time and time spent for planning, charting and coordination of care  Rulon Eisenmenger, MD, MPH 07/15/2020  I, Molly Dorshimer, am acting as scribe for Dr. Nicholas Lose.  I have reviewed the above documentation for accuracy and completeness, and I agree with the above.

## 2020-07-15 ENCOUNTER — Telehealth: Payer: Self-pay | Admitting: Hematology and Oncology

## 2020-07-15 ENCOUNTER — Other Ambulatory Visit: Payer: Self-pay

## 2020-07-15 ENCOUNTER — Inpatient Hospital Stay (HOSPITAL_BASED_OUTPATIENT_CLINIC_OR_DEPARTMENT_OTHER): Payer: Medicare Other | Admitting: Hematology and Oncology

## 2020-07-15 DIAGNOSIS — C7951 Secondary malignant neoplasm of bone: Secondary | ICD-10-CM | POA: Diagnosis not present

## 2020-07-15 DIAGNOSIS — C50919 Malignant neoplasm of unspecified site of unspecified female breast: Secondary | ICD-10-CM

## 2020-07-15 NOTE — Assessment & Plan Note (Signed)
Metastatic breast cancer: Originally diagnosed in 1995, patient did not take prescribed tamoxifen. Now presenting with metastatic disease based on scans done 03/11/2020 CT CAP: Numerous lytic lesions in the thoracic spine several with expansion extraosseous extension. 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 Uncertain if any radiation was given to the skull lesions.  Current Treatment:   letrozole. Started December 2021 Letrozole toxicities: Denies any side effects to letrozole  Poor nutritional status: Since the tube feedings have started her weight is improving.  Energy levels are improving and she is got a better positive outlook.    CT CAP 07/12/2020: Large expansile mass originating in the right second rib 5 cm, smaller expansile mass right lateral eighth rib 3 cm.  Multiple additional sclerotic lesions in spine and ribs and pelvis (expansile lesion 4.4 cm)  I recommended starting her on Ibrance therapy given the extent of her disease.

## 2020-07-15 NOTE — Telephone Encounter (Signed)
Scheduled appts per 3/21 los. Pt aware.  

## 2020-07-16 ENCOUNTER — Inpatient Hospital Stay: Payer: Medicare Other

## 2020-07-16 ENCOUNTER — Telehealth: Payer: Self-pay

## 2020-07-16 NOTE — Telephone Encounter (Signed)
Nutrition follow-up:  Called patient for nutrition follow-up. No answer or option to leave voicemail as mailbox full.  Called daughter and left message with call back number.  Marico Buckle B. Zenia Resides, [place], [place] Registered Dietitian [phone] (mobile)

## 2020-07-23 ENCOUNTER — Telehealth: Payer: Self-pay

## 2020-07-23 NOTE — Telephone Encounter (Signed)
Nutrition Follow-up:  Patient with metastatic breast cancer currently on letrozole.  Palliative care following patient.    Spoke with patient via phone.  Patient reports that she is giving osmolite 1.5 1 carton 5 times per day.  Flushes feeding tube with about 1/2 syringe (42ml) before and 21ml after carton and places some water in with formula (~40ml-120ml total each feeding).  Feedings are ate 7am, 10am, 1pm, 4pm and 7pm.  Reports that bowels are moving normally.  Denies issues with feeding.  Reports excess mucus that keeps her from eating much solid foods.  Drinks liquids water, hot tea, coffee, milk.       Medications: reviewed  Labs: reviewed  Anthropometrics:   Weight 120 lb 8 oz on 3/21 (MD visit) increased from 110 lb 6.4 oz on 05/10/20  104 lb on 12/6 129 lb on 11/09/2019   Estimated Energy Needs  Kcals: 1500-1750 Protein: 75-88 g Fluid: 1.5 L  NUTRITION DIAGNOSIS: Inadequate oral intake continues but relying on feeding tube for nutrition   INTERVENTION:  Continue 5 cartons of osmolite 1.5 daily via feeding tube.  Flush with 90-183ml water at each feeding.   Provides 1775 calories, 75 g protein and 1350-152ml free water. Patient to eat and drink as able orally. Will mail oral care handout.      MONITORING, EVALUATION, GOAL: weight trends, intake, tube feeding   NEXT VISIT: ~3 months phone f/u  Jodi Kaufman, [place], [place] Registered Dietitian [phone] (mobile)

## 2020-08-14 ENCOUNTER — Telehealth: Payer: Self-pay | Admitting: Family Medicine

## 2020-08-14 NOTE — Telephone Encounter (Signed)
Daughter called in stating that patients feeding tube has a greenish color to it. Thinks it is an infection. This has been for about 3 days. Patient is a 40 minute patient and pcp does not have any openings only acute slots. Please advise.

## 2020-08-14 NOTE — Telephone Encounter (Signed)
She really needs to call the GI who placed the tube I am assuming she is talking about the skin around the tube?

## 2020-08-15 ENCOUNTER — Telehealth: Payer: Self-pay | Admitting: *Deleted

## 2020-08-15 ENCOUNTER — Other Ambulatory Visit (HOSPITAL_COMMUNITY): Payer: Self-pay | Admitting: Physician Assistant

## 2020-08-15 ENCOUNTER — Other Ambulatory Visit: Payer: Self-pay | Admitting: Physician Assistant

## 2020-08-15 ENCOUNTER — Telehealth (HOSPITAL_COMMUNITY): Payer: Self-pay | Admitting: Physician Assistant

## 2020-08-15 MED ORDER — CEPHALEXIN 250 MG/5ML PO SUSR: 500.0000 mg | Freq: Four times a day (QID) | ORAL | 0 refills | Status: AC

## 2020-08-15 NOTE — Telephone Encounter (Signed)
Received call from pt daughter Arville Go stating the guaze surrounding pt feeding tube is green and saturated with blood.  States pt is asymptomatic and afebrile.  Per MD pt needing to be evaluated in IR for further evaluation and treatment.  RN placed call to Tiffany in IR who stated she will inform PA and arrange pt to be seen for evaluation. Pt daughter notified of plan and verbalized understanding.

## 2020-08-15 NOTE — Telephone Encounter (Signed)
Spoke with pt's daughter, she states she will call GI in regards to the tube.

## 2020-08-15 NOTE — Telephone Encounter (Signed)
  [place] is a 80 year old female who underwent percutaneous gastrostomy tube placement back on April 15, 2020 by Dr. Pascal Lux.  Patient's daughter called with concern for greenish drainage around the gastrostomy tube exit site.  When I returned the daughters call she was not actually at the patient's side at that time.  I explained how to troubleshoot the issue and that sometimes when the bumper was not cinched down to the skin that gastric contents could leak and gastric contents can appear green.  The daughter was worried that the site could be infected and requested that I call in antibiotics.  She is going to troubleshoot the gastrostomy tube and make sure that the bumper is cinched correctly.  If there is still greenish drainage I have instructed her to cleanse the area with either normal saline or Bactine spray.  I instructed her to avoid Neosporin ointment due to the petroleum and likelihood that this will leave the stoma too moist.  I have called in Keflex 500 mg in liquid form to be given via gastrostomy tube twice daily.  I did offer for her to bring the patient in for evaluation but the daughter stated that she would like to evaluate the tube first and try the antibiotics and the wound care before she brings her in.  She understands if she continues to have any issues she can give [place] a call and we are happy to see the patient for evaluation of the tube site.   Yoltzin Ransom S Dinh Ayotte PA-C 08/15/2020 3:28 PM

## 2020-08-19 ENCOUNTER — Other Ambulatory Visit (HOSPITAL_COMMUNITY): Payer: Self-pay | Admitting: Radiology

## 2020-08-19 ENCOUNTER — Telehealth: Payer: Self-pay | Admitting: *Deleted

## 2020-08-19 DIAGNOSIS — Z431 Encounter for attention to gastrostomy: Secondary | ICD-10-CM

## 2020-08-19 NOTE — Telephone Encounter (Signed)
Received call from pt requesting f/u with MD.  Pt states she will be seen this week in IR for evaluation of feeding tube pain and increase in drainage.  Pt concerned for possible tumor growth and requesting to be seen by MD after IR evaluation.  Apt scheduled and pt verbalized understanding of date and time.

## 2020-08-19 NOTE — Telephone Encounter (Signed)
Received call from pt daughter Arville Go stating pt is experiencing severe pain around feeding tube site.  States pt was prescribed an antibiotic by IR and when pt takes antibiotic, peg tube pain intensifies.  RN placed call to IR and spoke with Linna Hoff who states he will reach out to pt to further assess.

## 2020-08-22 ENCOUNTER — Other Ambulatory Visit (HOSPITAL_COMMUNITY): Payer: Self-pay | Admitting: Radiology

## 2020-08-22 ENCOUNTER — Other Ambulatory Visit: Payer: Self-pay

## 2020-08-22 ENCOUNTER — Ambulatory Visit (HOSPITAL_COMMUNITY)
Admission: RE | Admit: 2020-08-22 | Discharge: 2020-08-22 | Disposition: A | Discharge status: Home / Self Care | Payer: Medicare Other | Source: Ambulatory Visit | Attending: Radiology | Admitting: Radiology

## 2020-08-22 DIAGNOSIS — Z431 Encounter for attention to gastrostomy: Secondary | ICD-10-CM

## 2020-08-22 DIAGNOSIS — R109 Unspecified abdominal pain: Secondary | ICD-10-CM | POA: Diagnosis not present

## 2020-08-22 HISTORY — PX: IR PATIENT EVAL TECH 0-60 MINS: IMG5564

## 2020-08-22 NOTE — Progress Notes (Signed)
80 year old female s/p percutaneous gastrostomy tube placement on April 15, 2020 by Dr. Pascal Lux.    HPI: Patient and her daughter preseneted to [facility] IR for PEG tube check.  Patient states that she has been having green drainage around the gastrostomy tube exit site, and called [facility] IR and had antibiotic prescribed by one of IR PAs.  Patient then started to having non specific left sided abdominal pain immediately after injection the antibiotic into her PEG tube. No nausea or vomiting. Patient stopped taking the antibiotics due to abdominal pain.   Patient and her daughter state that she has been having abdominal pain for 3 months, but started to having issues such as drainage around the PEG tube exit site with the PEG tube last month. Patient had follow up CT scan on 07/12/20 which did not show concerning GI features.    PE:  The PEG tube is intact, flushes and aspirates well.   Skin around the PEG tube is clean, dry, no erythremia, soft to touch. The exit site appears well healed.  No drainage noted.   Abdomen is soft, non tender.    Assessment/ Plan: Intact, functioning  PEG tube.   It is uncertain that why patient is having left sided abdominal pain after injecting antibiotics.  As there is no sings of injection that is related to the PEG tube, advised patient to discontinue the antibiotics.  No further IR intervention needed at this time.   Patient has appointment at the [facility] to discuss the etiology of the left sided abdominal pain.    Armando Gang Anntionette Madkins PA-C 08/22/2020 11:56 AM

## 2020-08-22 NOTE — Progress Notes (Signed)
Patient Care Team: Hali Marry, MD as PCP - General (Family Medicine)  DIAGNOSIS:    ICD-10-CM   1. Metastatic breast cancer ([place])  C50.919     SUMMARY OF ONCOLOGIC HISTORY: Oncology History  Metastatic breast cancer ([place])  1995 Initial Biopsy   Right breast cancer stage IIb ER positive IDC s/p mastectomy and 2 months of tamoxifen, lost to follow-up (treatment at [facility])   03/12/2020 - 03/26/2020 Radiation Therapy   Palliative radiation to the brain and spine for brain metastases   03/14/2020 Initial Diagnosis   Scans on 03/14/2020: Multiple bone metastases and brain metastases, biopsy of the rib: Breast cancer ER 80% PR negative HER-2 negative.  Moved to [facility] December 2021 to be closer to her family     CHIEF COMPLIANT: Follow-up of metastatic breast cancer   INTERVAL HISTORY: Jodi Kaufman is a 80 y.o. with above-mentioned history of metastatic breast cancer currently on letrozole. She presents to the clinic today for follow-up.    ALLERGIES:  is allergic to atorvastatin, clavulanic acid, and tamoxifen.  MEDICATIONS:  Current Outpatient Medications  Medication Sig Dispense Refill  . doxycycline (VIBRA-TABS) 100 MG tablet Take 1 tablet (100 mg total) by mouth 2 (two) times daily. 20 tablet 0  . letrozole (FEMARA) 2.5 MG tablet Take 1 tablet (2.5 mg total) by mouth daily. Per tube    . LORazepam (ATIVAN) 0.5 MG tablet Take 1 tablet (0.65m) by mouth 1 hour prior to imaging.  May repeat X 1 if needed. 2 tablet 0  . Nutritional Supplements (FEEDING SUPPLEMENT, OSMOLITE 1.5 CAL,) LIQD Place 120 mLs into feeding tube 5 (five) times daily. Advance to 2361m5 times a day if tolerated 1000 mL 20  . palbociclib (IBRANCE) 100 MG tablet Take 1 tablet (100 mg total) by mouth daily. Take for 21 days on, 7 days off, repeat every 28 days. Per tube     No current facility-administered medications for this visit.    PHYSICAL EXAMINATION: ECOG PERFORMANCE  STATUS: 1 - Symptomatic but completely ambulatory  Vitals:   08/23/20 1026  BP: 114/64  Pulse: 91  Resp: 19  Temp: 98.1 F (36.7 C)  SpO2: 99%   Filed Weights   08/23/20 1026  Weight: 123 lb 14.4 oz (56.2 kg)     LABORATORY DATA:  I have reviewed the data as listed CMP Latest Ref Rng & Units 07/05/2020 05/10/2020 04/18/2020  Glucose 70 - 99 mg/dL 123(H) 128(H) 137(H)  BUN 8 - 23 mg/dL _0 Creatinine 0.44 - 1.00 mg/dL 0.69 0.62 0.52  Sodium 135 - 145 mmol/L 137 138 135  Potassium 3.5 - 5.1 mmol/L 4.1 4.2 4.2  Chloride 98 - 111 mmol/L 105 103 102  CO2 22 - 32 mmol/L _1 Calcium 8.9 - 10.3 mg/dL 9.2 9.2 8.3(L)  Total Protein 6.5 - 8.1 g/dL 6.1(L) 6.4(L) -  Total Bilirubin 0.3 - 1.2 mg/dL 0.6 0.6 -  Alkaline Phos 38 - 126 U/L 193(H) 390(H) -  AST 15 - 41 U/L 21 24 -  ALT 0 - 44 U/L 21 18 -    Lab Results  Component Value Date   WBC 7.3 05/10/2020   HGB 14.2 05/10/2020   HCT 44.0 05/10/2020   MCV 94.6 05/10/2020   PLT 345 05/10/2020   NEUTROABS 6.0 05/10/2020    ASSESSMENT & PLAN:  [place] did not take prescribed tamoxifen. Now presenting with metastatic disease  based on scans done 03/11/2020 CT CAP: Numerous lytic lesions in the thoracic spine several with expansion extraosseous extension. 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 Uncertain if any radiation was given to the skull lesions.  CurrentTreatment: letrozole. StartedDecember 2021 Letrozoletoxicities:Denies any side effects to letrozole  Poor nutritional status: Since the tube feedings have started her weight is improving. Energy levels are improving and she is got a better positive outlook.  Feeding tube issues: Pain related to the feeding tube and increase  in drainage    CT CAP 07/12/2020: Large expansile mass originating in the right second rib 5 cm, smaller expansile mass right lateral eighth rib 3 cm.  Multiple additional sclerotic lesions in spine and ribs and pelvis (expansile lesion 4.4 cm) I recommended Xgeva to be added to her treatment starting in June. I ordered CT chest abdomen pelvis to be done in June as well.  She would like to get home physical therapy.  We will see if that is even possible with home health. Initial plan was to perform scans in June and make a determination of whether or not we need to add Ibrance at that time. Return to clinic in June after scans to discuss results.   No orders of the defined types were placed in this encounter.  The patient has a good understanding of the overall plan. she agrees with it. she will call with any problems that may develop before the next visit here.  Total time spent: 30 mins including face to face time and time spent for planning, charting and coordination of care  Rulon Eisenmenger, MD, MPH 08/23/2020  I, Molly Dorshimer, am acting as scribe for Dr. Nicholas Lose.  I have reviewed the above documentation for accuracy and completeness, and I agree with the above.

## 2020-08-23 ENCOUNTER — Telehealth: Payer: Self-pay | Admitting: Hematology and Oncology

## 2020-08-23 ENCOUNTER — Other Ambulatory Visit: Payer: Self-pay | Admitting: *Deleted

## 2020-08-23 ENCOUNTER — Encounter: Payer: Self-pay | Admitting: *Deleted

## 2020-08-23 ENCOUNTER — Inpatient Hospital Stay: Payer: Medicare Other | Attending: Hematology and Oncology | Admitting: Hematology and Oncology

## 2020-08-23 DIAGNOSIS — C50919 Malignant neoplasm of unspecified site of unspecified female breast: Secondary | ICD-10-CM | POA: Diagnosis not present

## 2020-08-23 DIAGNOSIS — Z170 Estrogen receptor positive status [ER+]: Secondary | ICD-10-CM | POA: Diagnosis not present

## 2020-08-23 DIAGNOSIS — Z79899 Other long term (current) drug therapy: Secondary | ICD-10-CM | POA: Insufficient documentation

## 2020-08-23 DIAGNOSIS — C7951 Secondary malignant neoplasm of bone: Secondary | ICD-10-CM | POA: Insufficient documentation

## 2020-08-23 DIAGNOSIS — Z79811 Long term (current) use of aromatase inhibitors: Secondary | ICD-10-CM | POA: Insufficient documentation

## 2020-08-23 DIAGNOSIS — C50911 Malignant neoplasm of unspecified site of right female breast: Secondary | ICD-10-CM | POA: Insufficient documentation

## 2020-08-23 DIAGNOSIS — C7931 Secondary malignant neoplasm of brain: Secondary | ICD-10-CM | POA: Insufficient documentation

## 2020-08-23 NOTE — Progress Notes (Signed)
Per MD request, RN placed order for [facility] PT and OT. RN placed call to [facility] with [facility] to set pt up.

## 2020-08-23 NOTE — Assessment & Plan Note (Signed)
Originally diagnosed in 1995, patient did not take prescribed tamoxifen. Now presenting with metastatic disease based on scans done 03/11/2020 CT CAP: Numerous lytic lesions in the thoracic spine several with expansion extraosseous extension. 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 Uncertain if any radiation was given to the skull lesions.  CurrentTreatment: letrozole. StartedDecember 2021 Letrozoletoxicities:Denies any side effects to letrozole  Poor nutritional status: Since the tube feedings have started her weight is improving. Energy levels are improving and she is got a better positive outlook.  Feeding tube issues: Pain related to the feeding tube and increase in drainage    CT CAP 07/12/2020: Large expansile mass originating in the right second rib 5 cm, smaller expansile mass right lateral eighth rib 3 cm.  Multiple additional sclerotic lesions in spine and ribs and pelvis (expansile lesion 4.4 cm)  Initial plan was to perform scans in June and make a determination of whether or not we need to add Ibrance at that time.

## 2020-08-23 NOTE — Telephone Encounter (Signed)
Scheduled per los. Gave avs and calendar  

## 2020-08-26 ENCOUNTER — Encounter: Payer: Self-pay | Admitting: *Deleted

## 2020-08-26 NOTE — Progress Notes (Signed)
Received fax from [facility] stating pt is requesting to receive [facility] services through [facility].  RN placed call to [facility] representative to establish [facility] OT and PT.

## 2020-09-02 ENCOUNTER — Telehealth (INDEPENDENT_AMBULATORY_CARE_PROVIDER_SITE_OTHER): Payer: Medicare Other | Admitting: Family Medicine

## 2020-09-02 ENCOUNTER — Encounter: Payer: Self-pay | Admitting: Family Medicine

## 2020-09-02 DIAGNOSIS — R059 Cough, unspecified: Secondary | ICD-10-CM | POA: Diagnosis not present

## 2020-09-02 DIAGNOSIS — J22 Unspecified acute lower respiratory infection: Secondary | ICD-10-CM | POA: Diagnosis not present

## 2020-09-02 MED ORDER — AZITHROMYCIN 250 MG PO TABS: ORAL_TABLET | ORAL | 0 refills | Status: AC

## 2020-09-02 NOTE — Progress Notes (Signed)
Hurting constantly on her L side around the feeding tube. the area has been check and unsure where the pain is coming from.  Coughing up greenish gunk x 2.5 wks. She has made Oncology aware of this also.

## 2020-09-02 NOTE — Progress Notes (Signed)
Virtual Visit via Telephone Note  I connected with [place] on 09/02/20 at 10:30 AM EDT by telephone and verified that I am speaking with the correct person using two identifiers.   I discussed the limitations, risks, security and privacy concerns of performing an evaluation and management service by telephone and the availability of in person appointments. I also discussed with the patient that there may be a patient responsible charge related to this service. The patient expressed understanding and agreed to proceed.  Patient location: at home  Provider loccation: In office   Subjective:    CC: Cough  HPI:  Cough with green sputum x 2.5 weeks.  Nasal drainage is clear.  No fever.  Occ feel SOB.  No ST or ear pain.    She has a diagnosis of metastatic breast cancer with boney mets. she now has a feeding tube and has been having pain around the site for at least a month or more.  Pain is 2 inches below and to the left of the area.  Says she feels better when she is putting liquid into the tube.  Worse when the stomach is empty.  Normal BMs.  Having them in early AM.    She is interested in having a colonoscopy to screen for colon cancer. She has a hsx of polyps.  No blood in the stool.  Had Abd/pelvis CT done 06/2020 showing.     Wants to know if she should get vaccinated for COVID.    Past medical history, Surgical history, Family history not pertinant except as noted below, Social history, Allergies, and medications have been entered into the medical record, reviewed, and corrections made.   Review of Systems: No fevers, chills, night sweats, weight loss, chest pain, or shortness of breath.   Objective:    General: Speaking clearly in complete sentences without any shortness of breath.  Alert and oriented x3.  Normal judgment. No apparent acute distress.    Impression and Recommendations:    Cough/Lower Respiratory Infection.- will tx with azithromycin. Call if not better  in one week. Can crush it.     Discussed speaking with Dr. Benson Norway her GI doc about wether or not to do a screening colonoscopy.    Discussed getting the COVID vaccine.I strongly encouraged her to get the vaccine.    I discussed the assessment and treatment plan with the patient. The patient was provided an opportunity to ask questions and all were answered. The patient agreed with the plan and demonstrated an understanding of the instructions.   The patient was advised to call back or seek an in-person evaluation if the symptoms worsen or if the condition fails to improve as anticipated.  I provided 25 minutes of non-face-to-face time during this encounter.   [name], MD

## 2020-09-13 ENCOUNTER — Ambulatory Visit: Payer: Medicare Other

## 2020-09-16 ENCOUNTER — Ambulatory Visit: Payer: Medicare Other

## 2020-09-26 ENCOUNTER — Encounter: Payer: Self-pay | Admitting: Radiology

## 2020-09-30 ENCOUNTER — Telehealth: Payer: Self-pay | Admitting: *Deleted

## 2020-09-30 ENCOUNTER — Ambulatory Visit: Payer: Medicare Other

## 2020-09-30 NOTE — Telephone Encounter (Signed)
Received call from pt regarding upcoming apts.  RN attempt x1 to return call.  No answer, LVM to return call to the office.

## 2020-10-10 ENCOUNTER — Telehealth: Payer: Self-pay

## 2020-10-10 MED ORDER — LORAZEPAM 0.5 MG PO TABS
ORAL_TABLET | ORAL | 0 refills | Status: DC
Start: 2020-10-10 — End: 2020-11-25

## 2020-10-10 NOTE — Telephone Encounter (Signed)
Pt requested Rx for anxiety/claustrophobia for upcoming imaging.  MD in agreement.  Rx sent, patient aware.

## 2020-10-11 ENCOUNTER — Other Ambulatory Visit: Payer: Medicare Other

## 2020-10-14 ENCOUNTER — Ambulatory Visit: Payer: Medicare Other | Admitting: Hematology and Oncology

## 2020-10-14 ENCOUNTER — Ambulatory Visit: Payer: Medicare Other

## 2020-10-16 ENCOUNTER — Other Ambulatory Visit: Payer: Self-pay | Admitting: *Deleted

## 2020-10-16 DIAGNOSIS — C7951 Secondary malignant neoplasm of bone: Secondary | ICD-10-CM

## 2020-10-17 ENCOUNTER — Inpatient Hospital Stay: Payer: Medicare Other | Attending: Hematology and Oncology

## 2020-10-17 ENCOUNTER — Other Ambulatory Visit: Payer: Self-pay

## 2020-10-17 DIAGNOSIS — Z170 Estrogen receptor positive status [ER+]: Secondary | ICD-10-CM | POA: Insufficient documentation

## 2020-10-17 DIAGNOSIS — C50911 Malignant neoplasm of unspecified site of right female breast: Secondary | ICD-10-CM | POA: Insufficient documentation

## 2020-10-17 DIAGNOSIS — Z923 Personal history of irradiation: Secondary | ICD-10-CM | POA: Insufficient documentation

## 2020-10-17 DIAGNOSIS — Z931 Gastrostomy status: Secondary | ICD-10-CM | POA: Diagnosis not present

## 2020-10-17 DIAGNOSIS — C7931 Secondary malignant neoplasm of brain: Secondary | ICD-10-CM | POA: Diagnosis not present

## 2020-10-17 DIAGNOSIS — G893 Neoplasm related pain (acute) (chronic): Secondary | ICD-10-CM | POA: Insufficient documentation

## 2020-10-17 DIAGNOSIS — Z79811 Long term (current) use of aromatase inhibitors: Secondary | ICD-10-CM | POA: Insufficient documentation

## 2020-10-17 DIAGNOSIS — C7951 Secondary malignant neoplasm of bone: Secondary | ICD-10-CM | POA: Insufficient documentation

## 2020-10-17 DIAGNOSIS — Z9011 Acquired absence of right breast and nipple: Secondary | ICD-10-CM | POA: Diagnosis not present

## 2020-10-17 DIAGNOSIS — R1011 Right upper quadrant pain: Secondary | ICD-10-CM | POA: Diagnosis not present

## 2020-10-17 LAB — CBC WITH DIFFERENTIAL (CANCER CENTER ONLY)
Abs Immature Granulocytes: 0.01 10*3/uL (ref 0.00–0.07)
Basophils Absolute: 0 10*3/uL (ref 0.0–0.1)
Basophils Relative: 1 %
Eosinophils Absolute: 0.2 10*3/uL (ref 0.0–0.5)
Eosinophils Relative: 4 %
HCT: 42.9 % (ref 36.0–46.0)
Hemoglobin: 14.2 g/dL (ref 12.0–15.0)
Immature Granulocytes: 0 %
Lymphocytes Relative: 8 %
Lymphs Abs: 0.4 10*3/uL — ABNORMAL LOW (ref 0.7–4.0)
MCH: 32.2 pg (ref 26.0–34.0)
MCHC: 33.1 g/dL (ref 30.0–36.0)
MCV: 97.3 fL (ref 80.0–100.0)
Monocytes Absolute: 0.6 10*3/uL (ref 0.1–1.0)
Monocytes Relative: 12 %
Neutro Abs: 4.2 10*3/uL (ref 1.7–7.7)
Neutrophils Relative %: 75 %
Platelet Count: 217 10*3/uL (ref 150–400)
RBC: 4.41 MIL/uL (ref 3.87–5.11)
RDW: 13.7 % (ref 11.5–15.5)
WBC Count: 5.5 10*3/uL (ref 4.0–10.5)
nRBC: 0 % (ref 0.0–0.2)

## 2020-10-17 LAB — CMP (CANCER CENTER ONLY)
ALT: 23 U/L (ref 0–44)
AST: 24 U/L (ref 15–41)
Albumin: 3.5 g/dL (ref 3.5–5.0)
Alkaline Phosphatase: 188 U/L — ABNORMAL HIGH (ref 38–126)
Anion gap: 8 (ref 5–15)
BUN: 29 mg/dL — ABNORMAL HIGH (ref 8–23)
CO2: 28 mmol/L (ref 22–32)
Calcium: 9.5 mg/dL (ref 8.9–10.3)
Chloride: 104 mmol/L (ref 98–111)
Creatinine: 0.77 mg/dL (ref 0.44–1.00)
GFR, Estimated: >60 mL/min (ref >60)
Glucose, Bld: 92 mg/dL (ref 70–99)
Potassium: 4.7 mmol/L (ref 3.5–5.1)
Sodium: 140 mmol/L (ref 135–145)
Total Bilirubin: 0.8 mg/dL (ref 0.3–1.2)
Total Protein: 6.7 g/dL (ref 6.5–8.1)

## 2020-10-18 ENCOUNTER — Ambulatory Visit (HOSPITAL_COMMUNITY)
Admission: RE | Admit: 2020-10-18 | Discharge: 2020-10-18 | Disposition: A | Discharge status: Home / Self Care | Payer: Medicare Other | Source: Ambulatory Visit | Attending: Hematology and Oncology | Admitting: Hematology and Oncology

## 2020-10-18 ENCOUNTER — Other Ambulatory Visit: Payer: Medicare Other

## 2020-10-18 DIAGNOSIS — C7951 Secondary malignant neoplasm of bone: Secondary | ICD-10-CM | POA: Diagnosis present

## 2020-10-18 DIAGNOSIS — C50919 Malignant neoplasm of unspecified site of unspecified female breast: Secondary | ICD-10-CM | POA: Insufficient documentation

## 2020-10-18 MED ORDER — IOHEXOL 300 MG/ML  SOLN
100.0000 mL | Freq: Once | INTRAMUSCULAR | Status: AC | PRN
  Administered 2020-10-18: 100 mL via INTRAVENOUS

## 2020-10-18 MED ORDER — SODIUM CHLORIDE (PF) 0.9 % IJ SOLN
INTRAMUSCULAR | Status: AC
  Filled 2020-10-18: qty 50

## 2020-10-20 NOTE — Progress Notes (Signed)
Patient Care Team: Hali Marry, MD as PCP - General (Family Medicine)  DIAGNOSIS:    ICD-10-CM   1. 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 Metastatic breast cancer ([place])  C50.919     3. Right upper quadrant abdominal pain  R10.11 NM HEPATOBILIARY      SUMMARY OF ONCOLOGIC HISTORY: Oncology History  Metastatic breast cancer ([place])  1995 Initial Biopsy   Right breast cancer stage IIb ER positive IDC s/p mastectomy and 2 months of tamoxifen, lost to follow-up (treatment at [facility])   03/12/2020 - 03/26/2020 Radiation Therapy   Palliative radiation to the brain and spine for brain metastases   03/14/2020 Initial Diagnosis   Scans on 03/14/2020: Multiple bone metastases and brain metastases, biopsy of the rib: Breast cancer ER 80% PR negative HER-2 negative.  Moved to [facility] December 2021 to be closer to her family     CHIEF COMPLIANT: Follow-up of metastatic breast cancer   INTERVAL HISTORY: Jodi Kaufman is a 80 y.o. with above-mentioned history of metastatic breast cancer currently on letrozole. She presents to the clinic today for follow-up.  She gets most of her nutrition through the PEG tube.  She is however able to eat and drink more.  However she has poor appetite.  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  Mild chronic abdominal pain right upper quadrant  ALLERGIES:  is allergic to atorvastatin, clavulanic acid, and tamoxifen.  MEDICATIONS:  Current Outpatient Medications  Medication Sig Dispense Refill   acetaminophen (TYLENOL) 160 MG/5ML liquid Take 500 mg by mouth every 4 (four) hours as needed for fever.     letrozole (FEMARA) 2.5 MG tablet Take 1 tablet (2.5 mg total) by mouth daily. Per tube     LORazepam (ATIVAN) 0.5 MG tablet Take 1 tablet (0.70m) by mouth 1 hour prior to imaging.  May repeat X 1 if needed. 2 tablet 0   Nutritional Supplements (FEEDING SUPPLEMENT, OSMOLITE 1.5 CAL,) LIQD Place 120 mLs into feeding tube 5 (five) times daily.  Advance to 2362m5 times a day if tolerated 1000 mL 20   No current facility-administered medications for this visit.    PHYSICAL EXAMINATION: ECOG PERFORMANCE STATUS: 1 - Symptomatic but completely ambulatory  Vitals:   10/21/20 1520  BP: 118/64  Pulse: 64  Resp: 18  Temp: 97.7 F (36.5 C)  SpO2: 99%   Filed Weights   10/21/20 1520  Weight: 123 lb 3.2 oz (55.9 kg)      LABORATORY DATA:  I have reviewed the data as listed CMP Latest Ref Rng & Units 10/17/2020 07/05/2020 05/10/2020  Glucose 70 - 99 mg/dL 92 123(H) 128(H)  BUN 8 - 23 mg/dL 29(H) 22 16  Creatinine 0.44 - 1.00 mg/dL 0.77 0.69 0.62  Sodium 135 - 145 mmol/L 140 137 138  Potassium 3.5 - 5.1 mmol/L 4.7 4.1 4.2  Chloride 98 - 111 mmol/L 104 105 103  CO2 22 - 32 mmol/L _0 Calcium 8.9 - 10.3 mg/dL 9.5 9.2 9.2  Total Protein 6.5 - 8.1 g/dL 6.7 6.1(L) 6.4(L)  Total Bilirubin 0.3 - 1.2 mg/dL 0.8 0.6 0.6  Alkaline Phos 38 - 126 U/L 188(H) 193(H) 390(H)  AST 15 - 41 U/L _1 ALT 0 - 44 U/L _2 Lab Results  Component Value Date   WBC 5.5 10/17/2020   HGB 14.2 10/17/2020   HCT 42.9 10/17/2020   MCV 97.3 10/17/2020  PLT 217 10/17/2020   NEUTROABS 4.2 10/17/2020    ASSESSMENT & PLAN:  Metastatic breast cancer ([place]) Originally diagnosed in 1995, patient did not take prescribed tamoxifen. Now presenting with metastatic disease based on scans done 03/11/2020 CT CAP: Numerous lytic lesions in the thoracic spine several with expansion extraosseous extension. 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  Uncertain if any radiation was given to the skull lesions.   Current Treatment:   letrozole. Started December 2021 Letrozole toxicities: Denies any side effects to letrozole   Poor nutritional status: Since the tube feedings have  started her weight is improving.  Energy levels are improving and she is got a better positive outlook.    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  No evidence of visceral metastatic disease. Based on stable scans, we recommended holding off on adding CDK inhibitor therapy at this time. Next scans will be in December 2022  Abdominal pain right upper quadrant: I suspect that she may have cholecystitis.  Therefore I will order HIDA scan.  I will also send a referral to [facility] surgery for her to be evaluated for cholecystectomy.  Return to clinic in 3 months for Xgeva and follow-up    Orders Placed This Encounter  Procedures   NM HEPATOBILIARY    Standing Status:   Future    Standing Expiration Date:   10/21/2021    Order Specific Question:   If indicated for the ordered procedure, I authorize the administration of a radiopharmaceutical per Radiology protocol    Answer:   Yes    Order Specific Question:   Preferred imaging location?    Answer:   [facility]    Order Specific Question:   Release to patient    Answer:   Immediate    The patient has a good understanding of the overall plan. she agrees with it. she will call with any problems that may develop before the next visit here.  Total time spent: 30 mins including face to face time and time spent for planning, charting and coordination of care  Rulon Eisenmenger, MD, MPH 10/21/2020  I, Thana Ates, am acting as scribe for Dr. Nicholas Lose.  I have reviewed the above documentation for accuracy and completeness, and I agree with the above.

## 2020-10-21 ENCOUNTER — Inpatient Hospital Stay: Payer: Medicare Other

## 2020-10-21 ENCOUNTER — Inpatient Hospital Stay (HOSPITAL_BASED_OUTPATIENT_CLINIC_OR_DEPARTMENT_OTHER): Payer: Medicare Other | Admitting: Hematology and Oncology

## 2020-10-21 ENCOUNTER — Ambulatory Visit: Payer: Medicare Other | Admitting: Hematology and Oncology

## 2020-10-21 ENCOUNTER — Other Ambulatory Visit: Payer: Self-pay

## 2020-10-21 VITALS — BP 118/64 | HR 64 | Temp 97.7°F | Resp 18 | Ht 66.0 in | Wt 123.2 lb

## 2020-10-21 DIAGNOSIS — C7951 Secondary malignant neoplasm of bone: Secondary | ICD-10-CM | POA: Diagnosis not present

## 2020-10-21 DIAGNOSIS — C50919 Malignant neoplasm of unspecified site of unspecified female breast: Secondary | ICD-10-CM

## 2020-10-21 DIAGNOSIS — R1011 Right upper quadrant pain: Secondary | ICD-10-CM | POA: Diagnosis not present

## 2020-10-21 MED ORDER — DENOSUMAB 120 MG/1.7ML ~~LOC~~ SOLN
SUBCUTANEOUS | Status: AC
  Filled 2020-10-21: qty 1.7

## 2020-10-21 MED ORDER — DENOSUMAB 120 MG/1.7ML ~~LOC~~ SOLN
120.0000 mg | Freq: Once | SUBCUTANEOUS | Status: AC
  Administered 2020-10-21: 120 mg via SUBCUTANEOUS

## 2020-10-21 NOTE — Assessment & Plan Note (Signed)
Originally diagnosed in 1995, patient did not take prescribed tamoxifen. Now presenting with metastatic disease based on scans done 03/11/2020 CT CAP: Numerous lytic lesions in the thoracic spine several with expansion extraosseous extension. 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 Uncertain if any radiation was given to the skull lesions.  CurrentTreatment: letrozole. StartedDecember 2021 Letrozoletoxicities:Denies any side effects to letrozole  Poor nutritional status: Since the tube feedings have started her weight is improving. Energy levels are improving and she is got a better positive outlook.  Feeding tube issues: Pain related to the feeding tube and increase in drainage   CT CAP 10/18/2020: Widespread mixed lytic and sclerotic bone metastases unchanged.  No evidence of visceral metastatic disease. Based on stable scans, we recommended holding off on adding CDK inhibitor therapy at this time.  I recommended Xgeva to be added to her treatment starting in June.  Home physical therapy  Return to clinic in 3 months for Xgeva and follow-up

## 2020-10-21 NOTE — Patient Instructions (Signed)
Denosumab injection What is this medication? DENOSUMAB (den oh sue mab) slows bone breakdown. Prolia is used to treat osteoporosis in women after menopause and in men, and in people who are taking corticosteroids for 6 months or more. Delton See is used to treat a high calcium level due to cancer and to prevent bone fractures and other bone problems caused by multiple myeloma or cancer bone metastases. Delton See is also used totreat giant cell tumor of the bone. This medicine may be used for other purposes; ask your health care provider orpharmacist if you have questions. COMMON BRAND NAME(S): Prolia, XGEVA What should I tell my care team before I take this medication? They need to know if you have any of these conditions: dental disease having surgery or tooth extraction infection kidney disease low levels of calcium or Vitamin D in the blood malnutrition on hemodialysis skin conditions or sensitivity thyroid or parathyroid disease an unusual reaction to denosumab, other medicines, foods, dyes, or preservatives pregnant or trying to get pregnant breast-feeding How should I use this medication? This medicine is for injection under the skin. It is given by a health careprofessional in a hospital or clinic setting. A special MedGuide will be given to you before each treatment. Be sure to readthis information carefully each time. For Prolia, talk to your pediatrician regarding the use of this medicine in children. Special care may be needed. For Delton See, talk to your pediatrician regarding the use of this medicine in children. While this drug may be prescribed for children as young as 13 years for selected conditions,precautions do apply. Overdosage: If you think you have taken too much of this medicine contact apoison control center or emergency room at once. NOTE: This medicine is only for you. Do not share this medicine with others. What if I miss a dose? It is important not to miss your dose. Call  your doctor or health careprofessional if you are unable to keep an appointment. What may interact with this medication? Do not take this medicine with any of the following medications: other medicines containing denosumab This medicine may also interact with the following medications: medicines that lower your chance of fighting infection steroid medicines like prednisone or cortisone This list may not describe all possible interactions. Give your health care provider a list of all the medicines, herbs, non-prescription drugs, or dietary supplements you use. Also tell them if you smoke, drink alcohol, or use illegaldrugs. Some items may interact with your medicine. What should I watch for while using this medication? Visit your doctor or health care professional for regular checks on your progress. Your doctor or health care professional may order blood tests andother tests to see how you are doing. Call your doctor or health care professional for advice if you get a fever, chills or sore throat, or other symptoms of a cold or flu. Do not treat yourself. This drug may decrease your body's ability to fight infection. Try toavoid being around people who are sick. You should make sure you get enough calcium and vitamin D while you are taking this medicine, unless your doctor tells you not to. Discuss the foods you eatand the vitamins you take with your health care professional. See your dentist regularly. Brush and floss your teeth as directed. Before youhave any dental work done, tell your dentist you are receiving this medicine. Do not become pregnant while taking this medicine or for 5 months after stopping it. Talk with your doctor or health care professional about your  birth control options while taking this medicine. Women should inform their doctor if they wish to become pregnant or think they might be pregnant. There is a potential for serious side effects to an unborn child. Talk to your health  careprofessional or pharmacist for more information. What side effects may I notice from receiving this medication? Side effects that you should report to your doctor or health care professionalas soon as possible: allergic reactions like skin rash, itching or hives, swelling of the face, lips, or tongue bone pain breathing problems dizziness jaw pain, especially after dental work redness, blistering, peeling of the skin signs and symptoms of infection like fever or chills; cough; sore throat; pain or trouble passing urine signs of low calcium like fast heartbeat, muscle cramps or muscle pain; pain, tingling, numbness in the hands or feet; seizures unusual bleeding or bruising unusually weak or tired Side effects that usually do not require medical attention (report to yourdoctor or health care professional if they continue or are bothersome): constipation diarrhea headache joint pain loss of appetite muscle pain runny nose tiredness upset stomach This list may not describe all possible side effects. Call your doctor for medical advice about side effects. You may report side effects to FDA at1-800-FDA-1088. Where should I keep my medication? This medicine is only given in a clinic, doctor's office, or other health caresetting and will not be stored at home. NOTE: This sheet is a summary. It may not cover all possible information. If you have questions about this medicine, talk to your doctor, pharmacist, orhealth care provider.  2022 Elsevier/Gold Standard (2017-08-20 16:10:44)

## 2020-10-23 ENCOUNTER — Telehealth: Payer: Self-pay | Admitting: Dietician

## 2020-10-23 NOTE — Telephone Encounter (Signed)
Nutrition  Attempted to contact patient via telephone for nutrition follow-up. Patient did not answer. Mailbox was full, unable to leave message. Will reattempt to contact patient for follow-up as able.

## 2020-11-01 ENCOUNTER — Other Ambulatory Visit: Payer: Self-pay | Admitting: *Deleted

## 2020-11-01 ENCOUNTER — Encounter: Payer: Self-pay | Admitting: *Deleted

## 2020-11-25 ENCOUNTER — Telehealth (INDEPENDENT_AMBULATORY_CARE_PROVIDER_SITE_OTHER): Payer: Medicare Other | Admitting: Family Medicine

## 2020-11-25 NOTE — Progress Notes (Signed)
No charge. Rescheduled.  Had difficulty with phone connection.

## 2020-11-27 ENCOUNTER — Encounter: Payer: Self-pay | Admitting: Family Medicine

## 2020-11-27 ENCOUNTER — Telehealth (INDEPENDENT_AMBULATORY_CARE_PROVIDER_SITE_OTHER): Payer: Medicare Other | Admitting: Family Medicine

## 2020-11-27 VITALS — Wt 122.0 lb

## 2020-11-27 DIAGNOSIS — R627 Adult failure to thrive: Secondary | ICD-10-CM | POA: Diagnosis not present

## 2020-11-27 DIAGNOSIS — J439 Emphysema, unspecified: Secondary | ICD-10-CM

## 2020-11-27 DIAGNOSIS — J441 Chronic obstructive pulmonary disease with (acute) exacerbation: Secondary | ICD-10-CM | POA: Diagnosis not present

## 2020-11-27 MED ORDER — ALBUTEROL SULFATE HFA 108 (90 BASE) MCG/ACT IN AERS: 2.0000 | INHALATION_SPRAY | Freq: Four times a day (QID) | RESPIRATORY_TRACT | 0 refills | Status: DC | PRN

## 2020-11-27 MED ORDER — PREDNISONE 20 MG PO TABS: 40.0000 mg | ORAL_TABLET | Freq: Every day | ORAL | 0 refills | Status: DC

## 2020-11-27 MED ORDER — DOXYCYCLINE HYCLATE 100 MG PO TABS: 100.0000 mg | ORAL_TABLET | Freq: Two times a day (BID) | ORAL | 0 refills | Status: DC

## 2020-11-27 NOTE — Progress Notes (Signed)
Virtual Visit via Video Note  I connected with [place] on 11/27/20 at 10:10 AM EDT by a video enabled telemedicine application and verified that I am speaking with the correct person using two identifiers.   I discussed the limitations of evaluation and management by telemedicine and the availability of in person appointments. The patient expressed understanding and agreed to proceed.  Patient location: at home Provider location: in office  Subjective:    CC: cough  HPI: Has a mild intermittent cough and seeing green phlegm since last fall.  More recently has had Nose is runny.  No nasal congestion. Has a PEG tube.  Not eating much by mouth. No ST. No choking. Has been more SOB for 6 weeks as well. No fever or night sweats.    Also had 10 rounds of radiation for met for radiation. Lasted tx was around Thanksgiving and the phlegm started around that time.   Her daughter was present for the virtual visit as well  Past medical history, Surgical history, Family history not pertinant except as noted below, Social history, Allergies, and medications have been entered into the medical record, reviewed, and corrections made.    Objective:    General: Speaking clearly in complete sentences without any shortness of breath.  Alert and oriented x3.  Normal judgment. No apparent acute distress.    Impression and Recommendations:    No problem-specific Assessment & Plan notes found for this encounter.  COPD exacerbation -it sounds like she has had some chronic phlegm production going on for about 9 months.  But in the last 6 weeks she has actually had some concomitant shortness of breath.  She does have a diagnosis of emphysema seen on chest CT from 2020 so suspect she may be having a COPD exacerbation we will treat with doxycycline and prednisone if not better in 1 week then we will plan to get chest x-ray for further work-up.  No orders of the defined types were placed in this  encounter.   Meds ordered this encounter  Medications   doxycycline (VIBRA-TABS) 100 MG tablet    Sig: Take 1 tablet (100 mg total) by mouth 2 (two) times daily.    Dispense:  20 tablet    Refill:  0   predniSONE (DELTASONE) 20 MG tablet    Sig: Take 2 tablets (40 mg total) by mouth daily with breakfast.    Dispense:  10 tablet    Refill:  0   albuterol (VENTOLIN HFA) 108 (90 Base) MCG/ACT inhaler    Sig: Inhale 2 puffs into the lungs every 6 (six) hours as needed for wheezing or shortness of breath.    Dispense:  8 g    Refill:  0    If not better in one over, then will get a chest xray.    I discussed the assessment and treatment plan with the patient. The patient was provided an opportunity to ask questions and all were answered. The patient agreed with the plan and demonstrated an understanding of the instructions.   The patient was advised to call back or seek an in-person evaluation if the symptoms worsen or if the condition fails to improve as anticipated.   Jodi Lecher, MD

## 2020-12-24 ENCOUNTER — Other Ambulatory Visit: Payer: Self-pay | Admitting: Family Medicine

## 2020-12-27 ENCOUNTER — Telehealth: Payer: Self-pay | Admitting: *Deleted

## 2020-12-27 NOTE — Telephone Encounter (Signed)
Called patient twice and was unable to leave voicemail to make this appointment. Voicemail box was full.

## 2020-12-27 NOTE — Telephone Encounter (Signed)
Pt called and lvm stating that she believes that she has a really bad sinus infection. She would like to see Dr. Madilyn Fireman but understands that she may not have any openings today and would be ok with Monday or Tuesday. She also asked if she could send something in for this.   I called pt back unable to lvm due to her voicemail being full.   Will fwd this to scheduling to see if they can reach out to her to get her scheduled for Tuesday.

## 2020-12-27 NOTE — Telephone Encounter (Signed)
If Dr. Madilyn Fireman doesn't have anything open but a few acutes, is it okay to use one of them?

## 2021-01-01 ENCOUNTER — Encounter: Payer: Self-pay | Admitting: *Deleted

## 2021-01-01 ENCOUNTER — Other Ambulatory Visit: Payer: Self-pay | Admitting: *Deleted

## 2021-01-01 MED ORDER — AMOXICILLIN 500 MG PO CAPS: 500.0000 mg | ORAL_CAPSULE | Freq: Two times a day (BID) | ORAL | 0 refills | Status: DC

## 2021-01-01 NOTE — Progress Notes (Signed)
Received call from Dr. Joelyn Oms DDS with [facility] stating pt needing to undergo oral surgery to extract several infected teeth.  Per MD okay for pt to proceed with extraction.  Delton See will need to be held for 3 months.  Verbal orders also received from MD for pt to be prescribed Amoxicillin 500 mg p.o BID x 7 days.  Patient to start antibiotic the day before procedure.  Prescription sent to pharmacy on file and medical clearance faxed to Dr. Joelyn Oms at [phone].  Pt verbalized understanding and appreciative of advice.

## 2021-01-09 ENCOUNTER — Encounter: Payer: Self-pay | Admitting: *Deleted

## 2021-01-09 ENCOUNTER — Other Ambulatory Visit: Payer: Self-pay | Admitting: *Deleted

## 2021-01-09 DIAGNOSIS — R300 Dysuria: Secondary | ICD-10-CM

## 2021-01-11 NOTE — Progress Notes (Signed)
Patient Care Team: Hali Marry, MD as PCP - General (Family Medicine)  DIAGNOSIS:    ICD-10-CM   1. Metastatic breast cancer ([place])  C50.919       SUMMARY OF ONCOLOGIC HISTORY: Oncology History  Metastatic breast cancer ([place])  1995 Initial Biopsy   Right breast cancer stage IIb ER positive IDC s/p mastectomy and 2 months of tamoxifen, lost to follow-up (treatment at [facility])   03/12/2020 - 03/26/2020 Radiation Therapy   Palliative radiation to the brain and spine for brain metastases   03/14/2020 Initial Diagnosis   Scans on 03/14/2020: Multiple bone metastases and brain metastases, biopsy of the rib: Breast cancer ER 80% PR negative HER-2 negative.  Moved to [facility] December 2021 to be closer to her family     CHIEF COMPLIANT: Follow-up of metastatic breast cancer   INTERVAL HISTORY: Jodi Kaufman is a 80 y.o. with above-mentioned history of metastatic breast cancer currently on letrozole. CT CAP on 10/18/2020 showed widespread mixed lytic and sclerotic osseous metastatic disease is unchanged and no evidence of new metastatic disease in the chest, abdomen, or pelvis. She presents to the clinic today for follow-up.   ALLERGIES:  is allergic to atorvastatin, clavulanic acid, and tamoxifen.  MEDICATIONS:  Current Outpatient Medications  Medication Sig Dispense Refill   acetaminophen (TYLENOL) 160 MG/5ML liquid Take 500 mg by mouth every 4 (four) hours as needed for fever.     amoxicillin (AMOXIL) 500 MG capsule Take 1 capsule (500 mg total) by mouth 2 (two) times daily. 14 capsule 0   calcium citrate-vitamin D (CITRACAL+D) 315-200 MG-UNIT tablet Take 1 tablet by mouth daily.     doxycycline (VIBRA-TABS) 100 MG tablet Take 1 tablet (100 mg total) by mouth 2 (two) times daily. 20 tablet 0   letrozole (FEMARA) 2.5 MG tablet Take 1 tablet (2.5 mg total) by mouth daily. Per tube     Nutritional Supplements (FEEDING SUPPLEMENT, OSMOLITE 1.5 CAL,) LIQD Place 120  mLs into feeding tube 5 (five) times daily. Advance to 259ml 5 times a day if tolerated 1000 mL 20   predniSONE (DELTASONE) 20 MG tablet Take 2 tablets (40 mg total) by mouth daily with breakfast. 10 tablet 0   VENTOLIN HFA 108 (90 Base) MCG/ACT inhaler TAKE 2 PUFFS BY MOUTH EVERY 6 HOURS AS NEEDED FOR WHEEZE OR SHORTNESS OF BREATH 18 each 1   No current facility-administered medications for this visit.    PHYSICAL EXAMINATION: ECOG PERFORMANCE STATUS: 1 - Symptomatic but completely ambulatory  Vitals:   01/13/21 1441  BP: 113/66  Pulse: 86  Resp: 18  Temp: (!) 97.2 F (36.2 C)  SpO2: 100%   Filed Weights   01/13/21 1441  Weight: 127 lb (57.6 kg)    BREAST: No palpable masses or nodules in either right or left breasts. No palpable axillary supraclavicular or infraclavicular adenopathy no breast tenderness or nipple discharge. (exam performed in the presence of a chaperone)  LABORATORY DATA:  I have reviewed the data as listed CMP Latest Ref Rng & Units 10/17/2020 07/05/2020 05/10/2020  Glucose 70 - 99 mg/dL 92 123(H) 128(H)  BUN 8 - 23 mg/dL 29(H) 22 16  Creatinine 0.44 - 1.00 mg/dL 0.77 0.69 0.62  Sodium 135 - 145 mmol/L 140 137 138  Potassium 3.5 - 5.1 mmol/L 4.7 4.1 4.2  Chloride 98 - 111 mmol/L 104 105 103  CO2 22 - 32 mmol/L $RemoveB'28 26 29  'TwpIDLli$ Calcium 8.9 - 10.3 mg/dL 9.5 9.2 9.2  Total  Protein 6.5 - 8.1 g/dL 6.7 6.1(L) 6.4(L)  Total Bilirubin 0.3 - 1.2 mg/dL 0.8 0.6 0.6  Alkaline Phos 38 - 126 U/L 188(H) 193(H) 390(H)  AST 15 - 41 U/L $Remo'24 21 24  'BbxyY$ ALT 0 - 44 U/L $Remo'23 21 18    'QIMdb$ Lab Results  Component Value Date   WBC 5.6 01/13/2021   HGB 14.6 01/13/2021   HCT 44.0 01/13/2021   MCV 97.1 01/13/2021   PLT 190 01/13/2021   NEUTROABS 4.1 01/13/2021    ASSESSMENT & PLAN:  Metastatic breast cancer ([place]) Originally diagnosed in 1995, patient did not take prescribed tamoxifen. Now presenting with metastatic disease based on scans done 03/11/2020 CT CAP: Numerous lytic lesions in  the thoracic spine several with expansion extraosseous extension. 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  Uncertain if any radiation was given to the skull lesions.   Current Treatment:   letrozole. Started December 2021 Letrozole toxicities: Denies any side effects to letrozole   Poor nutritional status: Since the tube feedings have started her weight is improving.  Energy levels are improving and she is got a better positive outlook.    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  No evidence of visceral metastatic disease. Based on stable scans, we recommended holding off on adding CDK inhibitor therapy at this time. Next scans will be in December 2022   Mouth sores: Her dentist gave her Magic mouthwash Return to clinic in 3 months for Xgeva (if teeth issues resolve)and follow-up    No orders of the defined types were placed in this encounter.  The patient has a good understanding of the overall plan. she agrees with it. she will call with any problems that may develop before the next visit here.  Total time spent: 30 mins including face to face time and time spent for planning, charting and coordination of care  Rulon Eisenmenger, MD, MPH 01/13/2021  I, Thana Ates, am acting as scribe for Dr. Nicholas Lose.  I have reviewed the above documentation for accuracy and completeness, and I agree with the above.

## 2021-01-13 ENCOUNTER — Inpatient Hospital Stay (HOSPITAL_BASED_OUTPATIENT_CLINIC_OR_DEPARTMENT_OTHER): Payer: Medicare Other | Admitting: Hematology and Oncology

## 2021-01-13 ENCOUNTER — Inpatient Hospital Stay: Payer: Medicare Other | Attending: Hematology and Oncology

## 2021-01-13 ENCOUNTER — Other Ambulatory Visit: Payer: Self-pay

## 2021-01-13 ENCOUNTER — Ambulatory Visit: Payer: Medicare Other

## 2021-01-13 ENCOUNTER — Telehealth (HOSPITAL_COMMUNITY): Payer: Self-pay | Admitting: Dietician

## 2021-01-13 VITALS — BP 113/66 | HR 86 | Temp 97.2°F | Resp 18 | Ht 66.0 in | Wt 127.0 lb

## 2021-01-13 DIAGNOSIS — R109 Unspecified abdominal pain: Secondary | ICD-10-CM | POA: Insufficient documentation

## 2021-01-13 DIAGNOSIS — C7951 Secondary malignant neoplasm of bone: Secondary | ICD-10-CM

## 2021-01-13 DIAGNOSIS — C50919 Malignant neoplasm of unspecified site of unspecified female breast: Secondary | ICD-10-CM | POA: Diagnosis not present

## 2021-01-13 DIAGNOSIS — Z170 Estrogen receptor positive status [ER+]: Secondary | ICD-10-CM | POA: Insufficient documentation

## 2021-01-13 DIAGNOSIS — C50911 Malignant neoplasm of unspecified site of right female breast: Secondary | ICD-10-CM | POA: Insufficient documentation

## 2021-01-13 DIAGNOSIS — Z9011 Acquired absence of right breast and nipple: Secondary | ICD-10-CM | POA: Insufficient documentation

## 2021-01-13 DIAGNOSIS — R1011 Right upper quadrant pain: Secondary | ICD-10-CM

## 2021-01-13 DIAGNOSIS — R300 Dysuria: Secondary | ICD-10-CM

## 2021-01-13 LAB — URINALYSIS, COMPLETE (UACMP) WITH MICROSCOPIC
Bilirubin Urine: NEGATIVE
Glucose, UA: NEGATIVE mg/dL
Hgb urine dipstick: NEGATIVE
Ketones, ur: NEGATIVE mg/dL
Nitrite: NEGATIVE
Protein, ur: NEGATIVE mg/dL
Specific Gravity, Urine: 1.015 (ref 1.005–1.030)
pH: 7 (ref 5.0–8.0)

## 2021-01-13 LAB — CMP (CANCER CENTER ONLY)
ALT: 19 U/L (ref 0–44)
AST: 23 U/L (ref 15–41)
Albumin: 3.8 g/dL (ref 3.5–5.0)
Alkaline Phosphatase: 84 U/L (ref 38–126)
Anion gap: 7 (ref 5–15)
BUN: 30 mg/dL — ABNORMAL HIGH (ref 8–23)
CO2: 27 mmol/L (ref 22–32)
Calcium: 9.4 mg/dL (ref 8.9–10.3)
Chloride: 105 mmol/L (ref 98–111)
Creatinine: 0.8 mg/dL (ref 0.44–1.00)
GFR, Estimated: >60 mL/min (ref >60)
Glucose, Bld: 82 mg/dL (ref 70–99)
Potassium: 5 mmol/L (ref 3.5–5.1)
Sodium: 139 mmol/L (ref 135–145)
Total Bilirubin: 0.7 mg/dL (ref 0.3–1.2)
Total Protein: 6.6 g/dL (ref 6.5–8.1)

## 2021-01-13 LAB — CBC WITH DIFFERENTIAL (CANCER CENTER ONLY)
Abs Immature Granulocytes: 0.01 10*3/uL (ref 0.00–0.07)
Basophils Absolute: 0 10*3/uL (ref 0.0–0.1)
Basophils Relative: 1 %
Eosinophils Absolute: 0.2 10*3/uL (ref 0.0–0.5)
Eosinophils Relative: 4 %
HCT: 44 % (ref 36.0–46.0)
Hemoglobin: 14.6 g/dL (ref 12.0–15.0)
Immature Granulocytes: 0 %
Lymphocytes Relative: 10 %
Lymphs Abs: 0.5 10*3/uL — ABNORMAL LOW (ref 0.7–4.0)
MCH: 32.2 pg (ref 26.0–34.0)
MCHC: 33.2 g/dL (ref 30.0–36.0)
MCV: 97.1 fL (ref 80.0–100.0)
Monocytes Absolute: 0.7 10*3/uL (ref 0.1–1.0)
Monocytes Relative: 13 %
Neutro Abs: 4.1 10*3/uL (ref 1.7–7.7)
Neutrophils Relative %: 72 %
Platelet Count: 190 10*3/uL (ref 150–400)
RBC: 4.53 MIL/uL (ref 3.87–5.11)
RDW: 13.2 % (ref 11.5–15.5)
WBC Count: 5.6 10*3/uL (ref 4.0–10.5)
nRBC: 0 % (ref 0.0–0.2)

## 2021-01-13 NOTE — Assessment & Plan Note (Signed)
Originally diagnosed in 1995, patient did not take prescribed tamoxifen. Now presenting with metastatic disease based on scans done 03/11/2020 CT CAP: Numerous lytic lesions in the thoracic spine several with expansion extraosseous extension. 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 Uncertain if any radiation was given to the skull lesions.  CurrentTreatment: letrozole. StartedDecember 2021 Letrozoletoxicities:Denies any side effects to letrozole  Poor nutritional status: Since the tube feedings have started her weight is improving. Energy levels are improving and she is got a better positive outlook.   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  No evidence of visceral metastatic disease. Based on stable scans, we recommended holding off on adding CDK inhibitor therapy at this time. Next scans will be in December 2022  Abdominal pain right upper quadrant: I suspect that she may have cholecystitis.  Therefore I will order HIDA scan.  I will also send a referral to [facility] surgery for her to be evaluated for cholecystectomy.  Return to clinic in 3 months for Xgeva and follow-up

## 2021-01-13 NOTE — Telephone Encounter (Signed)
Nutrition  Attempted to contact patient via telephone for nutrition follow-up. Patient voicemail box is full and unable to leave message with request for return call.

## 2021-01-14 LAB — URINE CULTURE: Culture: NO GROWTH

## 2021-01-20 ENCOUNTER — Telehealth: Payer: Self-pay

## 2021-01-20 NOTE — Telephone Encounter (Signed)
(  4:42 pm) SW attempted to call patient to schedule a palliative care visit. SW was unable to leave a message.

## 2021-01-23 ENCOUNTER — Other Ambulatory Visit: Payer: Self-pay | Admitting: *Deleted

## 2021-01-23 MED ORDER — AMOXICILLIN 500 MG PO CAPS: 500.0000 mg | ORAL_CAPSULE | Freq: Two times a day (BID) | ORAL | 0 refills | Status: DC

## 2021-01-23 NOTE — Telephone Encounter (Signed)
Received call from pt with complaint of bleeding and infected gums.  Pt states she has several teeth the need to be extracted and has a consultation with Dr. Ara Kussmaul Oral surgeon [phone]) on February 13, 2021.  Pt requesting refill on Amoxicillin 500 mg bid to prevent worsening oral infection while she wait to be seen.  Per MD okay to refill for an additional week.  RN also placed call to Dr. Evette Cristal office to see if consultation can be moved up due to pt worsening condition.  No answer, LVM for the office to return call to our office.  Pt educated on process and verbalized understanding.

## 2021-02-03 ENCOUNTER — Telehealth: Payer: Self-pay | Admitting: *Deleted

## 2021-02-03 ENCOUNTER — Other Ambulatory Visit: Payer: Self-pay | Admitting: Hematology and Oncology

## 2021-02-03 MED ORDER — LORAZEPAM 0.5 MG PO TABS: 0.5000 mg | ORAL_TABLET | Freq: Three times a day (TID) | ORAL | 0 refills | Status: DC

## 2021-02-03 NOTE — Telephone Encounter (Signed)
Received call from pt with complaint of increase in anxiety and difficulty sleeping at night.  Pt states she is undergoing oral surgery tomorrow and requesting PRN ativan to alleviate anxiety.  RN will review with MD.

## 2021-02-03 NOTE — Progress Notes (Signed)
Severe anxiety related to dental procedures: I sent a prescription for Ativan to be taken as needed.

## 2021-02-12 ENCOUNTER — Telehealth: Payer: Self-pay | Admitting: Dietician

## 2021-02-12 NOTE — Telephone Encounter (Signed)
Nutrition  Third and final attempt to reach patient by telephone. Have left messages with return number as able. Please consult RD for future needs.

## 2021-03-19 ENCOUNTER — Encounter: Payer: Self-pay | Admitting: Family Medicine

## 2021-03-19 ENCOUNTER — Telehealth (INDEPENDENT_AMBULATORY_CARE_PROVIDER_SITE_OTHER): Payer: Medicare Other | Admitting: Family Medicine

## 2021-03-19 DIAGNOSIS — J329 Chronic sinusitis, unspecified: Secondary | ICD-10-CM

## 2021-03-19 DIAGNOSIS — J40 Bronchitis, not specified as acute or chronic: Secondary | ICD-10-CM

## 2021-03-19 MED ORDER — DOXYCYCLINE HYCLATE 100 MG PO TABS: 100.0000 mg | ORAL_TABLET | Freq: Two times a day (BID) | ORAL | 0 refills | Status: DC

## 2021-03-19 NOTE — Progress Notes (Signed)
   Virtual Visit via Telephone Note  I connected with Jodi Kaufman on 03/19/21 at 11:30 AM EST by telephone and verified that I am speaking with the correct person using two identifiers.   I discussed the limitations, risks, security and privacy concerns of performing an evaluation and management service by telephone and the availability of in person appointments. I also discussed with the patient that there may be a patient responsible charge related to this service. The patient expressed understanding and agreed to proceed.  Patient location: Provider loccation: In office   Subjective:    CC:  No chief complaint on file.   HPI: Pt reports sxs x 2 wks with cough and congestion. She hasn't taken anything for her symptoms, hasn't taken a COVID test but can take one. Pt denies any f/s/c/n/v she has diarrhea but this is associated with her cancer medication.  Pain over left side of forehead and cheek. She is very congested. She stated that her daughter has been sick and this has been her only sick contact.  Her chest is really tight and she has green mucus. She isn't taking anything for it.  Headache but this is from teeth grinding     Past medical history, Surgical history, Family history not pertinant except as noted below, Social history, Allergies, and medications have been entered into the medical record, reviewed, and corrections made.   Review of Systems: No fevers, chills, night sweats, weight loss, chest pain, or shortness of breath.   Objective:    General: Speaking clearly in complete sentences without any shortness of breath.  Alert and oriented x3.  Normal judgment. No apparent acute distress.    Impression and Recommendations:    Problem List Items Addressed This Visit   None Visit Diagnoses     Sinobronchitis    -  Primary   Relevant Medications   doxycycline (VIBRA-TABS) 100 MG tablet      Sinobronchitis - will treat with doxycyline.   call if not feeling  better after weekend.  OK to use OTC medication.   Meds ordered this encounter  Medications   doxycycline (VIBRA-TABS) 100 MG tablet    Sig: Take 1 tablet (100 mg total) by mouth 2 (two) times daily.    Dispense:  14 tablet    Refill:  0    Meds ordered this encounter  Medications   doxycycline (VIBRA-TABS) 100 MG tablet    Sig: Take 1 tablet (100 mg total) by mouth 2 (two) times daily.    Dispense:  14 tablet    Refill:  0     I discussed the assessment and treatment plan with the patient. The patient was provided an opportunity to ask questions and all were answered. The patient agreed with the plan and demonstrated an understanding of the instructions.   The patient was advised to call back or seek an in-person evaluation if the symptoms worsen or if the condition fails to improve as anticipated.  I provided 15 minutes of non-face-to-face time during this encounter.   Jodi Lecher, MD

## 2021-03-19 NOTE — Progress Notes (Signed)
Pt reports sxs x 2 wks. She hasn't taken anything for her symptoms, hasn't taken a COVID test but can take one. Headache but this is from teeth grinding  Pt denies any f/s/c/n/v she has diarrhea but this is associated with her cancer medication.   She stated that her daughter has been sick and this has been her only sick contact  Her chest is really tight and she has green mucus

## 2021-03-24 ENCOUNTER — Telehealth: Payer: Self-pay | Admitting: *Deleted

## 2021-03-24 MED ORDER — BENZONATATE 200 MG PO CAPS: 200.0000 mg | ORAL_CAPSULE | Freq: Three times a day (TID) | ORAL | 0 refills | Status: DC | PRN

## 2021-03-24 NOTE — Telephone Encounter (Signed)
Pt called and states that she is doing some better but still has a cough. Would like Dr. Madilyn Fireman to send over some more of the [facility] for her cough.

## 2021-03-25 ENCOUNTER — Telehealth: Payer: Medicare Other | Admitting: Family Medicine

## 2021-03-26 ENCOUNTER — Telehealth: Payer: Self-pay | Admitting: Family Medicine

## 2021-03-26 NOTE — Telephone Encounter (Signed)
Pt called.  She states she still has congestion and wants Dr. Madilyn Fireman to write a script for the med she prescribed to her for the congestion.

## 2021-03-28 ENCOUNTER — Telehealth (INDEPENDENT_AMBULATORY_CARE_PROVIDER_SITE_OTHER): Payer: Medicare Other | Admitting: Family Medicine

## 2021-03-28 ENCOUNTER — Encounter: Payer: Self-pay | Admitting: Family Medicine

## 2021-03-28 ENCOUNTER — Other Ambulatory Visit: Payer: Self-pay | Admitting: Hematology and Oncology

## 2021-03-28 DIAGNOSIS — J329 Chronic sinusitis, unspecified: Secondary | ICD-10-CM

## 2021-03-28 DIAGNOSIS — J40 Bronchitis, not specified as acute or chronic: Secondary | ICD-10-CM | POA: Diagnosis not present

## 2021-03-28 MED ORDER — DOXYCYCLINE HYCLATE 100 MG PO TABS: 100.0000 mg | ORAL_TABLET | Freq: Two times a day (BID) | ORAL | 0 refills | Status: DC

## 2021-03-28 NOTE — Progress Notes (Signed)
Virtual Visit via Telephone Note  I connected with Jodi Kaufman on 03/28/21 at  4:20 PM EST by telephone and verified that I am speaking with the correct person using two identifiers.   I discussed the limitations, risks, security and privacy concerns of performing an evaluation and management service by telephone and the availability of in person appointments. I also discussed with the patient that there may be a patient responsible charge related to this service. The patient expressed understanding and agreed to proceed.  Patient location: Provider loccation: In office   Subjective:    CC:   Chief Complaint  Patient presents with   Cough    HPI: Seen for sinobronchitis and treated with doxy for 10 days which did complete.  She is feeling better by about 40% but just can't get over the rest.  Pt reports that her issue is not a bad cough it's just that she is unable to get the mucus up from out of her chest.Runny nose which si clear.  Sputum is green in her chest.  She said that every morning since she started radiation she has had an ongoing "thing" with coughing up green phlegm. She stated that the radiation has caused issues with her sinuses. She did not p/u the Benzontate capsules she said that she didn't have $28.  She says she tried Mucinex but made her stomach ache   I asked if she tried any OTC medications she stated that she did not this time but when she does take the Mucinex it causes her stomach to become upset.  She hasn't used the inhaler because she is afraid that if she starts using the inhaler she will have to continue to use this.    She is pretty sure that she doesn't have a fever.    Past medical history, Surgical history, Family history not pertinant except as noted below, Social history, Allergies, and medications have been entered into the medical record, reviewed, and corrections made.   Review of Systems: No fevers, chills, night sweats, weight loss, chest  pain, or shortness of breath.   Objective:    General: Speaking clearly in complete sentences without any shortness of breath.  Alert and oriented x3.  Normal judgment. No apparent acute distress.    Impression and Recommendations:    Problem List Items Addressed This Visit   None Visit Diagnoses     Sinobronchitis    -  Primary   Relevant Medications   doxycycline (VIBRA-TABS) 100 MG tablet       Sinobronchitis - will send over an additional 5 days of doxy.  She really feels like if she just had a little bit longer course that she would be feeling a lot better today.  She says she does feel again about 40% better overall she really just cannot seem to move the mucus out of her chest and she is not really having a significant cough.  If she is not better after this treatment then I would recommend that she be seen in person for further evaluation.  She is not able to take Mucinex I did encourage her to try using her inhaler it can sometimes help as an expectorant.  Meds ordered this encounter  Medications   doxycycline (VIBRA-TABS) 100 MG tablet    Sig: Take 1 tablet (100 mg total) by mouth 2 (two) times daily.    Dispense:  10 tablet    Refill:  0    Meds ordered this encounter  Medications   doxycycline (VIBRA-TABS) 100 MG tablet    Sig: Take 1 tablet (100 mg total) by mouth 2 (two) times daily.    Dispense:  10 tablet    Refill:  0     I discussed the assessment and treatment plan with the patient. The patient was provided an opportunity to ask questions and all were answered. The patient agreed with the plan and demonstrated an understanding of the instructions.   The patient was advised to call back or seek an in-person evaluation if the symptoms worsen or if the condition fails to improve as anticipated.  I provided 16 minutes of non-face-to-face time during this encounter.   [name], MD

## 2021-03-28 NOTE — Progress Notes (Signed)
Pt reports that her issue is not a bad cough it's just that she is unable to get the mucus up from out of her chest.  She said that every morning since she started radiation she has had an ongoing "thing" with coughing up green phlegm. She stated that the radiation has caused issues with her sinuses.   She did not p/u the Benzontate capsules she said that she didn't have $28.   I asked if she tried any OTC medications she stated that she did not this time but when she does take the Mucinex it causes her stomach to become upset.  She hasn't used the inhaler because she is afraid that if she starts using the inhaler she will have to continue to use this.   She is pretty sure that she doesn't have a fever.

## 2021-03-28 NOTE — Telephone Encounter (Signed)
Pt has my chart visit today to discuss.

## 2021-04-01 ENCOUNTER — Ambulatory Visit: Payer: Medicare Other | Admitting: Family Medicine

## 2021-04-04 NOTE — Telephone Encounter (Signed)
Pt calling says she may be moving and wants a records release mailed to her at her daughters house. [place] [id number]    Not processed out yet since she isn't sure she is moving.    mailed

## 2021-04-11 ENCOUNTER — Other Ambulatory Visit: Payer: Self-pay

## 2021-04-11 DIAGNOSIS — C50919 Malignant neoplasm of unspecified site of unspecified female breast: Secondary | ICD-10-CM

## 2021-04-11 NOTE — Progress Notes (Signed)
Orders for lab placed.

## 2021-04-14 ENCOUNTER — Encounter: Payer: Self-pay | Admitting: Family Medicine

## 2021-04-14 ENCOUNTER — Telehealth (INDEPENDENT_AMBULATORY_CARE_PROVIDER_SITE_OTHER): Payer: Medicare Other | Admitting: Family Medicine

## 2021-04-14 ENCOUNTER — Other Ambulatory Visit: Payer: Self-pay

## 2021-04-14 ENCOUNTER — Inpatient Hospital Stay: Payer: Medicare Other | Attending: Hematology and Oncology

## 2021-04-14 DIAGNOSIS — C50911 Malignant neoplasm of unspecified site of right female breast: Secondary | ICD-10-CM | POA: Insufficient documentation

## 2021-04-14 DIAGNOSIS — J019 Acute sinusitis, unspecified: Secondary | ICD-10-CM | POA: Diagnosis not present

## 2021-04-14 DIAGNOSIS — Z79811 Long term (current) use of aromatase inhibitors: Secondary | ICD-10-CM | POA: Insufficient documentation

## 2021-04-14 DIAGNOSIS — C7931 Secondary malignant neoplasm of brain: Secondary | ICD-10-CM | POA: Insufficient documentation

## 2021-04-14 DIAGNOSIS — Z9011 Acquired absence of right breast and nipple: Secondary | ICD-10-CM | POA: Diagnosis not present

## 2021-04-14 DIAGNOSIS — Z7983 Long term (current) use of bisphosphonates: Secondary | ICD-10-CM | POA: Insufficient documentation

## 2021-04-14 DIAGNOSIS — H1031 Unspecified acute conjunctivitis, right eye: Secondary | ICD-10-CM

## 2021-04-14 DIAGNOSIS — Z923 Personal history of irradiation: Secondary | ICD-10-CM | POA: Insufficient documentation

## 2021-04-14 DIAGNOSIS — C7951 Secondary malignant neoplasm of bone: Secondary | ICD-10-CM | POA: Diagnosis not present

## 2021-04-14 DIAGNOSIS — C50919 Malignant neoplasm of unspecified site of unspecified female breast: Secondary | ICD-10-CM

## 2021-04-14 LAB — CBC WITH DIFFERENTIAL (CANCER CENTER ONLY)
Abs Immature Granulocytes: 0.01 10*3/uL (ref 0.00–0.07)
Basophils Absolute: 0 10*3/uL (ref 0.0–0.1)
Basophils Relative: 1 %
Eosinophils Absolute: 0.3 10*3/uL (ref 0.0–0.5)
Eosinophils Relative: 4 %
HCT: 44.1 % (ref 36.0–46.0)
Hemoglobin: 14.7 g/dL (ref 12.0–15.0)
Immature Granulocytes: 0 %
Lymphocytes Relative: 8 %
Lymphs Abs: 0.5 10*3/uL — ABNORMAL LOW (ref 0.7–4.0)
MCH: 32.1 pg (ref 26.0–34.0)
MCHC: 33.3 g/dL (ref 30.0–36.0)
MCV: 96.3 fL (ref 80.0–100.0)
Monocytes Absolute: 0.7 10*3/uL (ref 0.1–1.0)
Monocytes Relative: 10 %
Neutro Abs: 5 10*3/uL (ref 1.7–7.7)
Neutrophils Relative %: 77 %
Platelet Count: 205 10*3/uL (ref 150–400)
RBC: 4.58 MIL/uL (ref 3.87–5.11)
RDW: 13.2 % (ref 11.5–15.5)
WBC Count: 6.5 10*3/uL (ref 4.0–10.5)
nRBC: 0 % (ref 0.0–0.2)

## 2021-04-14 LAB — CMP (CANCER CENTER ONLY)
ALT: 22 U/L (ref 0–44)
AST: 25 U/L (ref 15–41)
Albumin: 3.8 g/dL (ref 3.5–5.0)
Alkaline Phosphatase: 77 U/L (ref 38–126)
Anion gap: 7 (ref 5–15)
BUN: 28 mg/dL — ABNORMAL HIGH (ref 8–23)
CO2: 28 mmol/L (ref 22–32)
Calcium: 9.4 mg/dL (ref 8.9–10.3)
Chloride: 106 mmol/L (ref 98–111)
Creatinine: 0.82 mg/dL (ref 0.44–1.00)
GFR, Estimated: >60 mL/min (ref >60)
Glucose, Bld: 101 mg/dL — ABNORMAL HIGH (ref 70–99)
Potassium: 4.5 mmol/L (ref 3.5–5.1)
Sodium: 141 mmol/L (ref 135–145)
Total Bilirubin: 0.8 mg/dL (ref 0.3–1.2)
Total Protein: 7 g/dL (ref 6.5–8.1)

## 2021-04-14 MED ORDER — CEFDINIR 300 MG PO CAPS: 300.0000 mg | ORAL_CAPSULE | Freq: Two times a day (BID) | ORAL | 0 refills | Status: DC

## 2021-04-14 MED ORDER — BACITRACIN-POLYMYXIN B 500-10000 UNIT/GM OP OINT
1.0000 | TOPICAL_OINTMENT | Freq: Three times a day (TID) | OPHTHALMIC | 0 refills | Status: DC
Start: 2021-04-14 — End: 2021-07-04

## 2021-04-14 NOTE — Progress Notes (Addendum)
Virtual Visit via Telephone Note  I connected with [place] on 04/14/21 at  3:00 PM EST by telephone and verified that I am speaking with the correct person using two identifiers.   I discussed the limitations, risks, security and privacy concerns of performing an evaluation and management service by telephone and the availability of in person appointments. I also discussed with the patient that there may be a patient responsible charge related to this service. The patient expressed understanding and agreed to proceed.  Patient location: at home Provider loccation: In office   Subjective:    CC:  No chief complaint on file.   HPI: Pt reports the sxs began 4 days ago with recurrent of green nasal discharged.  Cough was better but not gone.she just completed doxy about a week ago. Then developed green mucous in her right eye. She has tried just using warm water on cloth to clean her eye. She said that over the weekend she has noticed more green mucus coming out of her nose and coughing. Using throat lozenges for the cough. She really hasn't used anything else because of her Cancer dx. Cough drops do help. White part looks red.  No vision change.      Past medical history, Surgical history, Family history not pertinant except as noted below, Social history, Allergies, and medications have been entered into the medical record, reviewed, and corrections made.   Review of Systems: No fevers, chills, night sweats, weight loss, chest pain, or shortness of breath.   Objective:    General: Speaking clearly in complete sentences without any shortness of breath.  Alert and oriented x3.  Normal judgment. No apparent acute distress.  Impression and Recommendations:    Problem List Items Addressed This Visit   None Visit Diagnoses     Acute bacterial conjunctivitis of right eye    -  Primary   Relevant Medications   cefdinir (OMNICEF) 300 MG capsule   bacitracin-polymyxin b  (POLYSPORIN) ophthalmic ointment   Acute non-recurrent sinusitis, unspecified location       Relevant Medications   cefdinir (OMNICEF) 300 MG capsule      Symptomatic care. Run humidifier. Discussed that she is some better. She feels her chest is better but the nasal congestion is back and now her eye is draining. Will tx with Omnicef since can't take Augmentin and just completed doxy.  If not better in one week needs to be seen in person.   Meds ordered this encounter  Medications   cefdinir (OMNICEF) 300 MG capsule    Sig: Take 1 capsule (300 mg total) by mouth 2 (two) times daily.    Dispense:  14 capsule    Refill:  0   bacitracin-polymyxin b (POLYSPORIN) ophthalmic ointment    Sig: Place 1 application into the right eye 3 (three) times daily. apply to eye every 12 hours while awake x 7 days    Dispense:  3.5 g    Refill:  0    Meds ordered this encounter  Medications   cefdinir (OMNICEF) 300 MG capsule    Sig: Take 1 capsule (300 mg total) by mouth 2 (two) times daily.    Dispense:  14 capsule    Refill:  0   bacitracin-polymyxin b (POLYSPORIN) ophthalmic ointment    Sig: Place 1 application into the right eye 3 (three) times daily. apply to eye every 12 hours while awake x 7 days    Dispense:  3.5 g  Refill:  0     I discussed the assessment and treatment plan with the patient. The patient was provided an opportunity to ask questions and all were answered. The patient agreed with the plan and demonstrated an understanding of the instructions.   The patient was advised to call back or seek an in-person evaluation if the symptoms worsen or if the condition fails to improve as anticipated.  I provided 15 minutes of non-face-to-face time during this encounter.   [name], MD

## 2021-04-14 NOTE — Progress Notes (Signed)
Pt reports the sxs began 4 days ago.   No pain/swelling just cloudiness when the mucus is productive. She has tried just using warm water on cloth to clean her eye. This is only affecting her R eye. She said that over the weekend she has noticed more green mucus coming out of her nose and coughing. Using throat lozenges for the cough. She really hasn't used anything else because of her Cancer dx.

## 2021-04-15 ENCOUNTER — Ambulatory Visit (HOSPITAL_COMMUNITY)
Admission: RE | Admit: 2021-04-15 | Discharge: 2021-04-15 | Disposition: A | Discharge status: Home / Self Care | Payer: Medicare Other | Source: Ambulatory Visit | Attending: Hematology and Oncology | Admitting: Hematology and Oncology

## 2021-04-15 ENCOUNTER — Encounter (HOSPITAL_COMMUNITY): Payer: Self-pay

## 2021-04-15 DIAGNOSIS — C7951 Secondary malignant neoplasm of bone: Secondary | ICD-10-CM | POA: Diagnosis present

## 2021-04-15 DIAGNOSIS — C50919 Malignant neoplasm of unspecified site of unspecified female breast: Secondary | ICD-10-CM | POA: Diagnosis present

## 2021-04-15 DIAGNOSIS — R1011 Right upper quadrant pain: Secondary | ICD-10-CM | POA: Diagnosis present

## 2021-04-15 MED ORDER — SODIUM CHLORIDE (PF) 0.9 % IJ SOLN
INTRAMUSCULAR | Status: AC
  Filled 2021-04-15: qty 50

## 2021-04-15 MED ORDER — IOHEXOL 350 MG/ML SOLN
80.0000 mL | Freq: Once | INTRAVENOUS | Status: AC | PRN
  Administered 2021-04-15: 10:00:00 80 mL via INTRAVENOUS

## 2021-04-17 ENCOUNTER — Ambulatory Visit: Payer: Medicare Other | Admitting: Hematology and Oncology

## 2021-04-17 ENCOUNTER — Ambulatory Visit: Payer: Medicare Other

## 2021-04-23 ENCOUNTER — Other Ambulatory Visit: Payer: Self-pay | Admitting: *Deleted

## 2021-04-24 ENCOUNTER — Ambulatory Visit: Payer: Medicare Other | Admitting: Hematology and Oncology

## 2021-04-24 ENCOUNTER — Ambulatory Visit: Payer: Medicare Other

## 2021-04-24 NOTE — Progress Notes (Signed)
Patient Care Team: Hali Marry, MD as PCP - General (Family Medicine)  DIAGNOSIS:    ICD-10-CM   1. Metastatic breast cancer ([place])  C50.919       SUMMARY OF ONCOLOGIC HISTORY: Oncology History  Metastatic breast cancer ([place])  1995 Initial Biopsy   Right breast cancer stage IIb ER positive IDC s/p mastectomy and 2 months of tamoxifen, lost to follow-up (treatment at [facility])   03/12/2020 - 03/26/2020 Radiation Therapy   Palliative radiation to the brain and spine for brain metastases   03/14/2020 Initial Diagnosis   Scans on 03/14/2020: Multiple bone metastases and brain metastases, biopsy of the rib: Breast cancer ER 80% PR negative HER-2 negative.  Moved to [facility] December 2021 to be closer to her family     CHIEF COMPLIANT: Follow-up of metastatic breast cancer   INTERVAL HISTORY: Jodi Kaufman is a 80 y.o. with above-mentioned history of metastatic breast cancer currently on letrozole. CT CAP on 04/15/2021 showed no new or progressive interval findings. She presents to the clinic today for follow-up.  She has had upper respiratory infections as well as eye infections all of which are healing and recovering well.  She was on antibiotics as well as oral iron ointments.  Her PEG tube appears to have lots of CardioNet and there is also a what appears to be moldy changes to the PEG tube.  ALLERGIES:  is allergic to atorvastatin, clavulanic acid, guaifenesin & derivatives, and tamoxifen.  MEDICATIONS:  Current Outpatient Medications  Medication Sig Dispense Refill   acetaminophen (TYLENOL) 160 MG/5ML liquid Take 500 mg by mouth every 4 (four) hours as needed for fever.     bacitracin-polymyxin b (POLYSPORIN) ophthalmic ointment Place 1 application into the right eye 3 (three) times daily. apply to eye every 12 hours while awake x 7 days 3.5 g 0   calcium citrate-vitamin D (CITRACAL+D) 315-200 MG-UNIT tablet Take 1 tablet by mouth daily.     cefdinir  (OMNICEF) 300 MG capsule Take 1 capsule (300 mg total) by mouth 2 (two) times daily. 14 capsule 0   letrozole (FEMARA) 2.5 MG tablet TAKE 1 TABLET BY MOUTH EVERY DAY 90 tablet 3   Nutritional Supplements (FEEDING SUPPLEMENT, OSMOLITE 1.5 CAL,) LIQD Place 120 mLs into feeding tube 5 (five) times daily. Advance to 246ml 5 times a day if tolerated 1000 mL 20   VENTOLIN HFA 108 (90 Base) MCG/ACT inhaler TAKE 2 PUFFS BY MOUTH EVERY 6 HOURS AS NEEDED FOR WHEEZE OR SHORTNESS OF BREATH (Patient not taking: Reported on 04/14/2021) 18 each 1   No current facility-administered medications for this visit.    PHYSICAL EXAMINATION: ECOG PERFORMANCE STATUS: 1 - Symptomatic but completely ambulatory  Vitals:   04/25/21 1107  BP: 120/63  Pulse: 81  Resp: 16  Temp: 97.8 F (36.6 C)  SpO2: 99%   Filed Weights   04/25/21 1107  Weight: 130 lb 4 oz (59.1 kg)      LABORATORY DATA:  I have reviewed the data as listed CMP Latest Ref Rng & Units 04/14/2021 01/13/2021 10/17/2020  Glucose 70 - 99 mg/dL 101(H) 82 92  BUN 8 - 23 mg/dL 28(H) 30(H) 29(H)  Creatinine 0.44 - 1.00 mg/dL 0.82 0.80 0.77  Sodium 135 - 145 mmol/L 141 139 140  Potassium 3.5 - 5.1 mmol/L 4.5 5.0 4.7  Chloride 98 - 111 mmol/L 106 105 104  CO2 22 - 32 mmol/L $RemoveB'28 27 28  'unNdmdKW$ Calcium 8.9 - 10.3 mg/dL 9.4 9.4 9.5  Total Protein 6.5 - 8.1 g/dL 7.0 6.6 6.7  Total Bilirubin 0.3 - 1.2 mg/dL 0.8 0.7 0.8  Alkaline Phos 38 - 126 U/L 77 84 188(H)  AST 15 - 41 U/L $Remo'25 23 24  'dcAMP$ ALT 0 - 44 U/L $Remo'22 19 23    'MWHbn$ Lab Results  Component Value Date   WBC 6.5 04/14/2021   HGB 14.7 04/14/2021   HCT 44.1 04/14/2021   MCV 96.3 04/14/2021   PLT 205 04/14/2021   NEUTROABS 5.0 04/14/2021    ASSESSMENT & PLAN:  Metastatic breast cancer ([place]) Originally diagnosed in 1995, patient did not take prescribed tamoxifen. Now presenting with metastatic disease based on scans done 03/11/2020 CT CAP: Numerous lytic lesions in the thoracic spine several with expansion  extraosseous extension. 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  Uncertain if any radiation was given to the skull lesions.   Current Treatment:   letrozole. Started December 2021 Letrozole toxicities: Denies any side effects to letrozole   Poor nutritional status: Since the tube feedings have started her weight is improving.  Energy levels are improving and she is got a better positive outlook.    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  No evidence of visceral metastatic disease. CT CAP 04/15/2021: No substantial interval change similar bone metastases Based on stable scans, we recommended holding off on adding CDK inhibitor therapy at this time. Next scans will be in 6 months   Mouth sores: Her dentist gave her Magic mouthwash PEG tube related issues: Blackish moldy growth in the PEG tube.  We will request interventional radiology who placed the tube to replace it with a new tube.  Return to clinic in 3 months for Xgeva (initiating Delton See today since we got clearance from dental)    No orders of the defined types were placed in this encounter.  The patient has a good understanding of the overall plan. she agrees with it. she will call with any problems that may develop before the next visit here.  Total time spent: 30 mins including face to face time and time spent for planning, charting and coordination of care  Rulon Eisenmenger, MD, MPH 04/25/2021  I, Thana Ates, am acting as scribe for Dr. Nicholas Lose.  I have reviewed the above documentation for accuracy and completeness, and I agree with the above.

## 2021-04-25 ENCOUNTER — Other Ambulatory Visit: Payer: Self-pay

## 2021-04-25 ENCOUNTER — Inpatient Hospital Stay (HOSPITAL_BASED_OUTPATIENT_CLINIC_OR_DEPARTMENT_OTHER): Payer: Medicare Other | Admitting: Hematology and Oncology

## 2021-04-25 ENCOUNTER — Other Ambulatory Visit: Payer: Self-pay | Admitting: *Deleted

## 2021-04-25 ENCOUNTER — Inpatient Hospital Stay: Payer: Medicare Other

## 2021-04-25 DIAGNOSIS — C50919 Malignant neoplasm of unspecified site of unspecified female breast: Secondary | ICD-10-CM | POA: Diagnosis not present

## 2021-04-25 DIAGNOSIS — C50911 Malignant neoplasm of unspecified site of right female breast: Secondary | ICD-10-CM | POA: Diagnosis not present

## 2021-04-25 DIAGNOSIS — C7951 Secondary malignant neoplasm of bone: Secondary | ICD-10-CM

## 2021-04-25 DIAGNOSIS — Z431 Encounter for attention to gastrostomy: Secondary | ICD-10-CM

## 2021-04-25 MED ORDER — DENOSUMAB 120 MG/1.7ML ~~LOC~~ SOLN
120.0000 mg | Freq: Once | SUBCUTANEOUS | Status: AC
  Administered 2021-04-25: 12:00:00 120 mg via SUBCUTANEOUS
  Filled 2021-04-25: qty 1.7

## 2021-04-25 NOTE — Assessment & Plan Note (Signed)
Originally diagnosed in 1995, patient did not take prescribed tamoxifen. Now presenting with metastatic disease based on scans done 03/11/2020 CT CAP: Numerous lytic lesions in the thoracic spine several with expansion extraosseous extension. 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 Uncertain if any radiation was given to the skull lesions.  CurrentTreatment: letrozole. StartedDecember 2021 Letrozoletoxicities:Denies any side effects to letrozole  Poor nutritional status: Since the tube feedings have started her weight is improving. Energy levels are improving and she is got a better positive outlook.   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 No evidence of visceral metastatic disease. CT CAP 04/15/2021: No substantial interval change similar bone metastases Based on stable scans, we recommended holding off on adding CDK inhibitor therapy at this time. Next scans will be in 6 months  Mouth sores: Her dentist gave her Magic mouthwash Return to clinic in3 months for Xgeva (if teeth issues resolve)and follow-up

## 2021-04-29 ENCOUNTER — Telehealth: Payer: Self-pay

## 2021-04-29 NOTE — Telephone Encounter (Signed)
Received call from [facility] stating Osmolite 1.5 is on a national backorder and suggests Jevity 1.5. Pt is to have feeds of 142mL 5 times daily. Verbal order given to Runell Gess per MD. Orders signed and faxed.

## 2021-04-30 ENCOUNTER — Telehealth: Payer: Self-pay | Admitting: *Deleted

## 2021-04-30 NOTE — Telephone Encounter (Signed)
Patient's daughter called - LVM: [facility] manages her mother's tube feeding.They did not deliver Osmolite last week as promised, nor yesterday as promised. Daughter has contacted them again today as patient now out of Osmolite. She states patient takes in only small amount of food by mouth and really needs tube feeding.  Contacted daughter to inform that order for Osmolite was faxed from office to [facility]. She states that [facility] has contacted her this AM and will bring Jevity this afternoon as substitute as Osmolite is back ordered. Encouraged daughter to continue to stay in touch with [place] to coordinate mother's feedings. Daughter verbalized understanding.

## 2021-05-20 ENCOUNTER — Encounter: Payer: Self-pay | Admitting: Family Medicine

## 2021-05-20 ENCOUNTER — Telehealth (INDEPENDENT_AMBULATORY_CARE_PROVIDER_SITE_OTHER): Payer: Medicare Other | Admitting: Family Medicine

## 2021-05-20 VITALS — Resp 16 | Ht 66.0 in | Wt 130.0 lb

## 2021-05-20 DIAGNOSIS — Z978 Presence of other specified devices: Secondary | ICD-10-CM

## 2021-05-20 DIAGNOSIS — R051 Acute cough: Secondary | ICD-10-CM | POA: Diagnosis not present

## 2021-05-20 DIAGNOSIS — R509 Fever, unspecified: Secondary | ICD-10-CM

## 2021-05-20 NOTE — Progress Notes (Signed)
Virtual Visit via Telephone Note  I connected with [place] on 05/20/21 at  3:20 PM EST by telephone and verified that I am speaking with the correct person using two identifiers.   I discussed the limitations, risks, security and privacy concerns of performing an evaluation and management service by telephone and the availability of in person appointments. I also discussed with the patient that there may be a patient responsible charge related to this service. The patient expressed understanding and agreed to proceed.  Patient location: athome  Provider loccation: In office   Subjective:    CC:   Chief Complaint  Patient presents with   Chest Congestion     3 days. Patient states she had a low grade fever on Friday at 99.0    HPI: She says that over the last couple of weeks she has had some pretty severe pain going from her right shoulder down to her right elbow and into her right side.  She says last Friday it was so severe that she would rate her pain an 11 or 12 out of a scale of 10.  She says at that point she can of hit her pain threshold and decided to start taking some Tylenol around-the-clock and did start to get some relief.  She still has pain with coughing or sneezing it will be about a 4 out of 5 but when she sitting and resting she has no pain now.  She was running a low-grade fever on Friday around 99.7 she says her normal temperature is around 97.7.  Then about 3 days ago she started coughing again and noticing green-colored mucus again we had just treated her about 4 to 5 weeks ago for bronchitis and sinusitis.  That was the third time that we had treated her with rounds of antibiotics.  She is also concerned about her feeding tube.  She says the last time she saw the oncologist they said that they were going out place in order to get her tube changed but she said she never heard back from them.  Now she is getting a green-colored discharge on the skin around the  tube.  She has been trying to rinse the tube with a little bit of vinegar water.   Past medical history, Surgical history, Family history not pertinant except as noted below, Social history, Allergies, and medications have been entered into the medical record, reviewed, and corrections made.   Review of Systems: No fevers, chills, night sweats, weight loss, chest pain, or shortness of breath.   Objective:    General: Speaking clearly in complete sentences without any shortness of breath.  Alert and oriented x3.  Normal judgment. No apparent acute distress.    Impression and Recommendations:    Problem List Items Addressed This Visit   None Visit Diagnoses     Acute cough    -  Primary   Relevant Orders   DG Chest 2 View   Uses feeding tube       Fever, unspecified fever cause           Cough-is the fourth visit in the last 2-1/2 months for productive cough.  We discussed at this point we really need to get a chest x-ray for further evaluation she did have a couple of weeks in between where she felt better but I am concerned that maybe there may be another underlying cause.  Consider that she could be aspirating and this is causing some recurrence.  Or she may just have a prolonged chronic infection that were not completely clearing. sputum cultures may actually be very helpful.  She will call her daughter to see where it is most convenient for her to get the x-ray done if that set her new med center or here in [place] she will give [place] call back.  In the meantime let us know if she gets worse.  Encouraged her to call the oncologist office back since she has not heard back about getting her feeding tube changed.  No orders of the defined types were placed in this encounter.   No orders of the defined types were placed in this encounter.    I discussed the assessment and treatment plan with the patient. The patient was provided an opportunity to ask questions and all were  answered. The patient agreed with the plan and demonstrated an understanding of the instructions.   The patient was advised to call back or seek an in-person evaluation if the symptoms worsen or if the condition fails to improve as anticipated.  I provided 22 minutes of non-face-to-face time during this encounter.   [name], MD

## 2021-05-22 ENCOUNTER — Telehealth: Payer: Self-pay

## 2021-05-22 DIAGNOSIS — R051 Acute cough: Secondary | ICD-10-CM

## 2021-05-22 NOTE — Telephone Encounter (Signed)
Order is already in.  I have gone ahead and placed it just in case for our location.  She can come anytime Monday through Friday 8-5.

## 2021-05-22 NOTE — Telephone Encounter (Signed)
[place] states she would like the chest xray in our building. Pended order.

## 2021-05-23 NOTE — Telephone Encounter (Signed)
Attempted to contact the patient, no answer. Unable to leave a vm msg. Vm is full.

## 2021-05-26 NOTE — Telephone Encounter (Signed)
Attempted to contact the patient, no answer. Unable to leave a vm msg. Vm is full.

## 2021-05-26 NOTE — Telephone Encounter (Signed)
Return call from patient. Patient advised.

## 2021-06-16 ENCOUNTER — Encounter: Payer: Self-pay | Admitting: Hematology and Oncology

## 2021-06-16 NOTE — Telephone Encounter (Signed)
No entry 

## 2021-07-02 ENCOUNTER — Telehealth: Payer: Self-pay | Admitting: Hematology and Oncology

## 2021-07-02 ENCOUNTER — Other Ambulatory Visit: Payer: Self-pay | Admitting: *Deleted

## 2021-07-02 DIAGNOSIS — C50919 Malignant neoplasm of unspecified site of unspecified female breast: Secondary | ICD-10-CM

## 2021-07-02 NOTE — Telephone Encounter (Signed)
Rescheduled appointment per patient request. Patient is aware.  ?

## 2021-07-04 ENCOUNTER — Telehealth (INDEPENDENT_AMBULATORY_CARE_PROVIDER_SITE_OTHER): Payer: Medicare Other | Admitting: Medical-Surgical

## 2021-07-04 ENCOUNTER — Encounter: Payer: Self-pay | Admitting: Medical-Surgical

## 2021-07-04 DIAGNOSIS — J329 Chronic sinusitis, unspecified: Secondary | ICD-10-CM

## 2021-07-04 DIAGNOSIS — J40 Bronchitis, not specified as acute or chronic: Secondary | ICD-10-CM | POA: Diagnosis not present

## 2021-07-04 MED ORDER — CEFDINIR 300 MG PO CAPS: 300.0000 mg | ORAL_CAPSULE | Freq: Two times a day (BID) | ORAL | 0 refills | Status: DC

## 2021-07-04 NOTE — Progress Notes (Signed)
Virtual Visit via Telephone ?  ?I connected with  [place]  on 07/04/21 by telephone/telehealth and verified that I am speaking with the correct person using two identifiers. ?  ?I discussed the limitations, risks, security and privacy concerns of performing an evaluation and management service by telephone, including the higher likelihood of inaccurate diagnosis and treatment, and the availability of in person appointments.  We also discussed the likely need of an additional face to face encounter for complete and high quality delivery of care.  I also discussed with the patient that there may be a patient responsible charge related to this service. The patient expressed understanding and wishes to proceed. ? ?Provider location is in medical facility. ?Patient location is at their home, different from provider location. ?People involved in care of the patient during this telehealth encounter were myself, my nurse/medical assistant, and my front office/scheduling team member. ? ?CC: congestion ? ?HPI: ?Pleasant 81 year old female presenting today via telephone to discuss issues with congestion. Reports she has been sick for 3-4 weeks and has been coughing up yellow/green mucus. Reports that her PCP wanted her to do a chest x-ray but she is homebound so could not do it. For the last week she has had rhinorrhea, coughing, sneezing, chest heaviness, and sore throat. Notes that her chest is sore from coughing and feels bloated. She lives with her daughter who tested positive for Covid on Tuesday. She has not tested herself because she doesn't know how. She planned to get her daughter to test her but did not do so before our call. Feels that she is not getting enough water due to a recent change in her tube feeding formula from Osmolite to [place]. Has only tried cough drops but they have not helped her symptoms. Notes she has to have medications that can be administered via feeding tube.  ? ?Review of Systems: See  HPI for pertinent positives and negatives.  ? ?Objective Findings:   ? ?General: Speaking full sentences, no audible heavy breathing.  Sounds alert and appropriately interactive.   ? ?Impression and Recommendations:   ? ?1. Sinobronchitis ?Close exposure to COVID, recommend testing for confirmation. Since she has had greater than 1 week of symptoms, no indication for treatment with Paxlovid/Molnupiravir. Will treat with cefdinir BID x 7 days. Advised that the capsule can be opened for administration via feeding tube. Recommend adding a small water bolus during the day while sick to help thin secretions. Discussed using Robitussin or Delsym cough syrup prn.  ?- cefdinir (OMNICEF) 300 MG capsule; Take 1 capsule (300 mg total) by mouth 2 (two) times daily.  Dispense: 14 capsule; Refill: 0 ? ?I discussed the above assessment and treatment plan with the patient. The patient was provided an opportunity to ask questions and all were answered. The patient agreed with the plan and demonstrated an understanding of the instructions. ?  ?The patient was advised to call back or seek an in-person evaluation if the symptoms worsen or if the condition fails to improve as anticipated. ? ?25 minutes of non-face-to-face time was provided during this encounter. ? ?Return if symptoms worsen or fail to improve. ?___________________________________________ ?Samuel Bouche, DNP, APRN, FNP-BC ?Primary Care and Sports Medicine ?[place] ? ?

## 2021-07-10 ENCOUNTER — Ambulatory Visit (INDEPENDENT_AMBULATORY_CARE_PROVIDER_SITE_OTHER): Payer: Medicare Other | Admitting: Family Medicine

## 2021-07-10 DIAGNOSIS — J329 Chronic sinusitis, unspecified: Secondary | ICD-10-CM | POA: Diagnosis not present

## 2021-07-10 DIAGNOSIS — J40 Bronchitis, not specified as acute or chronic: Secondary | ICD-10-CM | POA: Diagnosis not present

## 2021-07-10 MED ORDER — CEFDINIR 300 MG PO CAPS: 300.0000 mg | ORAL_CAPSULE | Freq: Two times a day (BID) | ORAL | 0 refills | Status: DC

## 2021-07-10 NOTE — Progress Notes (Signed)
Pt stated that she is making baby steps. She's not progressing as well as she had hoped. She said that a 7 day course of ABX usually is not enough and that she does a lot better on a 10-day course.  ? ? ?

## 2021-07-10 NOTE — Progress Notes (Signed)
? ?  Virtual Visit via Telephone Note ? ?I connected with [place] on 07/10/21 at  4:20 PM EDT by telephone and verified that I am speaking with the correct person using two identifiers. ?  ?I discussed the limitations, risks, security and privacy concerns of performing an evaluation and management service by telephone and the availability of in person appointments. I also discussed with the patient that there may be a patient responsible charge related to this service. The patient expressed understanding and agreed to proceed. ? ?Patient location: at home  ?Provider loccation: In office ? ? ?Subjective:   ? ?CC:  No chief complaint on file. ? ? ?HPI:she still has some congestion. She feels she is getting some better but needs more days of ABX. She has been opening the capsule and putting in her G tube. No fever or chills.   ?Draining from her sinuses and throat. She feels she is 40% better.   ? ? ?Past medical history, Surgical history, Family history not pertinant except as noted below, Social history, Allergies, and medications have been entered into the medical record, reviewed, and corrections made.  ? ?Review of Systems: No fevers, chills, night sweats, weight loss, chest pain, or shortness of breath.  ? ?Objective:   ? ?General: Speaking clearly in complete sentences without any shortness of breath.  Alert and oriented x3.  Normal judgment. No apparent acute distress. ? ? ? ?Impression and Recommendations:   ? ?Problem List Items Addressed This Visit   ?None ?Visit Diagnoses   ? ? Sinobronchitis      ? Relevant Medications  ? cefdinir (OMNICEF) 300 MG capsule  ? ?  ? ?Sinobronchitis - will add 4 more days of ABX.  Recommend adding nasal saline. If not better then please let us know.   ? ?She also reports that twice she is actually vomited after she is with the letrozole in her G-tube.  I did encourage her to speak with Dr. Sinclair Grooms about that she does have an upcoming appointment soon. ? ?Meds ordered  this encounter  ?Medications  ? cefdinir (OMNICEF) 300 MG capsule  ?  Sig: Take 1 capsule (300 mg total) by mouth 2 (two) times daily.  ?  Dispense:  8 capsule  ?  Refill:  0  ? ? ?Meds ordered this encounter  ?Medications  ? cefdinir (OMNICEF) 300 MG capsule  ?  Sig: Take 1 capsule (300 mg total) by mouth 2 (two) times daily.  ?  Dispense:  8 capsule  ?  Refill:  0  ? ?  ?I discussed the assessment and treatment plan with the patient. The patient was provided an opportunity to ask questions and all were answered. The patient agreed with the plan and demonstrated an understanding of the instructions. ?  ?The patient was advised to call back or seek an in-person evaluation if the symptoms worsen or if the condition fails to improve as anticipated. ? ?I provided 12 minutes of non-face-to-face time during this encounter. ? ? ?[name], MD ? ?

## 2021-07-11 ENCOUNTER — Inpatient Hospital Stay: Payer: Medicare Other

## 2021-07-14 ENCOUNTER — Telehealth: Payer: Self-pay | Admitting: *Deleted

## 2021-07-14 NOTE — Telephone Encounter (Signed)
Received VM from pt.  RN attempt x1 to return call.  No answer, unable to LVM due to VM being full.  

## 2021-07-15 ENCOUNTER — Other Ambulatory Visit: Payer: Self-pay

## 2021-07-15 ENCOUNTER — Inpatient Hospital Stay: Payer: Medicare Other | Attending: Hematology and Oncology

## 2021-07-15 DIAGNOSIS — C50412 Malignant neoplasm of upper-outer quadrant of left female breast: Secondary | ICD-10-CM | POA: Diagnosis present

## 2021-07-15 DIAGNOSIS — Z9011 Acquired absence of right breast and nipple: Secondary | ICD-10-CM | POA: Diagnosis not present

## 2021-07-15 DIAGNOSIS — Z170 Estrogen receptor positive status [ER+]: Secondary | ICD-10-CM | POA: Diagnosis not present

## 2021-07-15 DIAGNOSIS — C7931 Secondary malignant neoplasm of brain: Secondary | ICD-10-CM | POA: Diagnosis not present

## 2021-07-15 DIAGNOSIS — C50919 Malignant neoplasm of unspecified site of unspecified female breast: Secondary | ICD-10-CM

## 2021-07-15 DIAGNOSIS — Z79890 Hormone replacement therapy (postmenopausal): Secondary | ICD-10-CM | POA: Insufficient documentation

## 2021-07-15 DIAGNOSIS — Z931 Gastrostomy status: Secondary | ICD-10-CM | POA: Diagnosis not present

## 2021-07-15 DIAGNOSIS — C7951 Secondary malignant neoplasm of bone: Secondary | ICD-10-CM | POA: Diagnosis not present

## 2021-07-15 DIAGNOSIS — Z7983 Long term (current) use of bisphosphonates: Secondary | ICD-10-CM | POA: Insufficient documentation

## 2021-07-15 LAB — CBC WITH DIFFERENTIAL (CANCER CENTER ONLY)
Abs Immature Granulocytes: 0.01 10*3/uL (ref 0.00–0.07)
Basophils Absolute: 0 10*3/uL (ref 0.0–0.1)
Basophils Relative: 0 %
Eosinophils Absolute: 0.2 10*3/uL (ref 0.0–0.5)
Eosinophils Relative: 4 %
HCT: 43.7 % (ref 36.0–46.0)
Hemoglobin: 14.3 g/dL (ref 12.0–15.0)
Immature Granulocytes: 0 %
Lymphocytes Relative: 13 %
Lymphs Abs: 0.5 10*3/uL — ABNORMAL LOW (ref 0.7–4.0)
MCH: 30.9 pg (ref 26.0–34.0)
MCHC: 32.7 g/dL (ref 30.0–36.0)
MCV: 94.4 fL (ref 80.0–100.0)
Monocytes Absolute: 0.7 10*3/uL (ref 0.1–1.0)
Monocytes Relative: 19 %
Neutro Abs: 2.5 10*3/uL (ref 1.7–7.7)
Neutrophils Relative %: 64 %
Platelet Count: 229 10*3/uL (ref 150–400)
RBC: 4.63 MIL/uL (ref 3.87–5.11)
RDW: 13.9 % (ref 11.5–15.5)
WBC Count: 4 10*3/uL (ref 4.0–10.5)
nRBC: 0 % (ref 0.0–0.2)

## 2021-07-15 LAB — CMP (CANCER CENTER ONLY)
ALT: 21 U/L (ref 0–44)
AST: 23 U/L (ref 15–41)
Albumin: 4 g/dL (ref 3.5–5.0)
Alkaline Phosphatase: 64 U/L (ref 38–126)
Anion gap: 6 (ref 5–15)
BUN: 28 mg/dL — ABNORMAL HIGH (ref 8–23)
CO2: 28 mmol/L (ref 22–32)
Calcium: 9.5 mg/dL (ref 8.9–10.3)
Chloride: 103 mmol/L (ref 98–111)
Creatinine: 0.83 mg/dL (ref 0.44–1.00)
GFR, Estimated: >60 mL/min (ref >60)
Glucose, Bld: 98 mg/dL (ref 70–99)
Potassium: 4.6 mmol/L (ref 3.5–5.1)
Sodium: 137 mmol/L (ref 135–145)
Total Bilirubin: 0.6 mg/dL (ref 0.3–1.2)
Total Protein: 6.9 g/dL (ref 6.5–8.1)

## 2021-07-15 LAB — URINALYSIS, COMPLETE (UACMP) WITH MICROSCOPIC
Bacteria, UA: NONE SEEN
Bilirubin Urine: NEGATIVE
Glucose, UA: NEGATIVE mg/dL
Hgb urine dipstick: NEGATIVE
Ketones, ur: 5 mg/dL — AB
Leukocytes,Ua: NEGATIVE
Nitrite: NEGATIVE
Protein, ur: NEGATIVE mg/dL
Specific Gravity, Urine: 1.023 (ref 1.005–1.030)
pH: 7 (ref 5.0–8.0)

## 2021-07-17 LAB — URINE CULTURE

## 2021-07-17 NOTE — Progress Notes (Signed)
? ?Patient Care Team: ?Hali Marry, MD as PCP - General (Family Medicine) ? ?DIAGNOSIS:  ?Encounter Diagnoses  ?Name Primary?  ? Metastatic breast cancer ([place])   ? Malignant neoplasm of upper-outer quadrant of left breast in female, estrogen receptor positive ([place])   ? ? ?SUMMARY OF ONCOLOGIC HISTORY: ?Oncology History  ?Metastatic breast cancer ([place])  ?1995 Initial Biopsy  ? Right breast cancer stage IIb ER positive IDC s/p mastectomy and 2 months of tamoxifen, lost to follow-up (treatment at [facility]) ?  ?03/12/2020 - 03/26/2020 Radiation Therapy  ? Palliative radiation to the brain and spine for brain metastases ?  ?03/14/2020 Initial Diagnosis  ? Scans on 03/14/2020: Multiple bone metastases and brain metastases, biopsy of the rib: Breast cancer ER 80% PR negative HER-2 negative.  Moved to [facility] December 2021 to be closer to her family ?  ? ? ?CHIEF COMPLIANT:  Follow-up of metastatic breast cancer  ? ?INTERVAL HISTORY: Jodi Kaufman is a 81 y.o. with above-mentioned history of metastatic breast cancer currently on letrozole. CT CAP on 04/15/2021 showed no new or progressive interval findings. She presents to the clinic today for follow-up. She complains of an bladder infection. ?She stated that 3 wks ago she vomit after the letrozole. A wk later it happed again.. The vomit is coming up through her throat. She tolerated the Xgeva. She complains of being paranoid. ? ?ALLERGIES:  is allergic to atorvastatin, clavulanic acid, guaifenesin & derivatives, and tamoxifen. ? ?MEDICATIONS:  ?Current Outpatient Medications  ?Medication Sig Dispense Refill  ? acetaminophen (TYLENOL) 160 MG/5ML liquid Take 500 mg by mouth every 4 (four) hours as needed for fever.    ? calcium citrate-vitamin D (CITRACAL+D) 315-200 MG-UNIT tablet Take 1 tablet by mouth daily.    ? cefdinir (OMNICEF) 300 MG capsule Take 1 capsule (300 mg total) by mouth 2 (two) times daily. 8 capsule 0  ? letrozole (FEMARA) 2.5 MG  tablet TAKE 1 TABLET BY MOUTH EVERY DAY 90 tablet 3  ? Nutritional Supplements (FEEDING SUPPLEMENT, OSMOLITE 1.5 CAL,) LIQD Place 120 mLs into feeding tube 5 (five) times daily. Advance to 232m 5 times a day if tolerated 1000 mL 20  ? VENTOLIN HFA 108 (90 Base) MCG/ACT inhaler TAKE 2 PUFFS BY MOUTH EVERY 6 HOURS AS NEEDED FOR WHEEZE OR SHORTNESS OF BREATH 18 each 1  ? ?No current facility-administered medications for this visit.  ? ? ?PHYSICAL EXAMINATION: ?ECOG PERFORMANCE STATUS: 1 - Symptomatic but completely ambulatory ? ?Vitals:  ? 07/18/21 1047  ?BP: (!) 109/59  ?Pulse: (!) 112  ?Resp: 17  ?Temp: 97.7 ?F (36.5 ?C)  ?SpO2: 98%  ? ?Filed Weights  ? 07/18/21 1047  ?Weight: 133 lb 6.4 oz (60.5 kg)  ? ?  ? ?LABORATORY DATA:  ?I have reviewed the data as listed ? ?  Latest Ref Rng & Units 07/15/2021  ?  4:16 PM 04/14/2021  ? 10:21 AM 01/13/2021  ?  2:19 PM  ?CMP  ?Glucose 70 - 99 mg/dL 98   101   82    ?BUN 8 - 23 mg/dL _0 ?Creatinine 0.44 - 1.00 mg/dL 0.83   0.82   0.80    ?Sodium 135 - 145 mmol/L 137   141   139    ?Potassium 3.5 - 5.1 mmol/L 4.6   4.5   5.0    ?Chloride 98 - 111 mmol/L 103   106   105    ?  CO2 22 - 32 mmol/L _0 ?Calcium 8.9 - 10.3 mg/dL 9.5   9.4   9.4    ?Total Protein 6.5 - 8.1 g/dL 6.9   7.0   6.6    ?Total Bilirubin 0.3 - 1.2 mg/dL 0.6   0.8   0.7    ?Alkaline Phos 38 - 126 U/L 64   77   84    ?AST 15 - 41 U/L _1 ?ALT 0 - 44 U/L _2 ? ? ?Lab Results  ?Component Value Date  ? WBC 4.0 07/15/2021  ? HGB 14.3 07/15/2021  ? HCT 43.7 07/15/2021  ? MCV 94.4 07/15/2021  ? PLT 229 07/15/2021  ? NEUTROABS 2.5 07/15/2021  ? ? ?ASSESSMENT & PLAN:  ?Metastatic breast cancer ([place]) ?Originally diagnosed in 1995, patient did not take prescribed tamoxifen. ?Now presenting with metastatic disease based on scans done 03/11/2020 ?CT CAP: Numerous lytic lesions in the thoracic spine several with expansion extraosseous extension. 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  Uncertain if any radiation was given to the skull lesions. ?  ?Current Treatment:   letrozole. Started December 2021 ?Letrozole toxicities: Denies any side effects to letrozole ?  ?Poor nutritional status: Since the tube feedings have started her weight is improving.  Energy levels are improving and she is got a better positive outlook.  ?  ?CT CAP 10/18/2020: Widespread mixed lytic and sclerotic bone metastases unchanged.  No evidence of visceral metastatic disease. ?CT CAP 04/15/2021: No substantial interval change similar bone metastases ?Based on stable scans, we recommended holding off on adding CDK inhibitor therapy at this time. ?Next scans will be in 3 months ?  ?Mouth sores: Her dentist gave her Magic mouthwash ?PEG tube related issues: Blackish moldy growth in the PEG tube.  We will request interventional radiology who placed the tube to replace it with a new tube. ?  ?Return to clinic in 3 months for Xgeva (initiating Delton See today since we got clearance from dental) ? ? ? ?Orders Placed This Encounter  ?Procedures  ? CT CHEST ABDOMEN PELVIS W CONTRAST  ?  Standing Status:   Future  ?  Standing Expiration Date:   07/19/2022  ?  Order Specific Question:   Preferred imaging location?  ?  Answer:   [facility]  ?  Order Specific Question:   Release to patient  ?  Answer:   Immediate  ?  Order Specific Question:   Is Oral Contrast requested for this exam?  ?  Answer:   Yes, Per Radiology protocol  ? ?The patient has a good understanding of the overall plan. she agrees with it. she will call with any problems that may develop before the next visit here. ?Total time spent: 30 mins including face to face time and time spent for planning, charting and co-ordination of care ? ? Harriette Ohara, MD ?07/18/21 ? ? ? I Gardiner Coins  am scribing for Dr. Lindi Adie ? ?I have reviewed the above documentation for accuracy and completeness, and I agree with the above. ?  ?

## 2021-07-17 NOTE — Assessment & Plan Note (Signed)
Originally diagnosed in 1995, patient did not take prescribed tamoxifen. ?Now presenting with metastatic disease based on scans done 03/11/2020 ?CT CAP: Numerous lytic lesions in the thoracic spine several with expansion extraosseous extension. 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 ?Uncertain if any radiation was given to the skull lesions. ?? ?Current?Treatment: ??letrozole. Started?December 2021 ?Letrozole?toxicities:?Denies any side effects to letrozole ?? ?Poor nutritional status: Since the tube feedings have started her weight is improving. ?Energy levels are improving and she is got a better positive outlook.  ?? 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 ?No evidence of visceral metastatic disease. ?CT CAP 04/15/2021: No substantial interval change similar bone metastases ?Based on stable scans, we recommended holding off on adding CDK inhibitor therapy at this time. ?Next scans will be in 6 months ?? ?Mouth sores: Her dentist gave her Magic mouthwash ?PEG tube related issues: Blackish moldy growth in the PEG tube.  We will request interventional radiology who placed the tube to replace it with a new tube. ?? ?Return to clinic in?3 months for Xgeva?(initiating Xgeva today since we got clearance from dental) ?

## 2021-07-18 ENCOUNTER — Other Ambulatory Visit: Payer: Medicare Other

## 2021-07-18 ENCOUNTER — Inpatient Hospital Stay (HOSPITAL_BASED_OUTPATIENT_CLINIC_OR_DEPARTMENT_OTHER): Payer: Medicare Other | Admitting: Hematology and Oncology

## 2021-07-18 ENCOUNTER — Other Ambulatory Visit: Payer: Self-pay

## 2021-07-18 ENCOUNTER — Inpatient Hospital Stay: Payer: Medicare Other

## 2021-07-18 DIAGNOSIS — C50412 Malignant neoplasm of upper-outer quadrant of left female breast: Secondary | ICD-10-CM

## 2021-07-18 DIAGNOSIS — Z170 Estrogen receptor positive status [ER+]: Secondary | ICD-10-CM | POA: Diagnosis not present

## 2021-07-18 DIAGNOSIS — C7951 Secondary malignant neoplasm of bone: Secondary | ICD-10-CM

## 2021-07-18 DIAGNOSIS — C50919 Malignant neoplasm of unspecified site of unspecified female breast: Secondary | ICD-10-CM | POA: Diagnosis not present

## 2021-07-18 MED ORDER — DENOSUMAB 120 MG/1.7ML ~~LOC~~ SOLN
120.0000 mg | Freq: Once | SUBCUTANEOUS | Status: AC
  Administered 2021-07-18: 120 mg via SUBCUTANEOUS
  Filled 2021-07-18: qty 1.7

## 2021-07-21 ENCOUNTER — Encounter: Payer: Self-pay | Admitting: *Deleted

## 2021-07-21 NOTE — Progress Notes (Signed)
Per pt request, letter mailed to address on file.  ?

## 2021-07-27 ENCOUNTER — Encounter: Payer: Self-pay | Admitting: Hematology and Oncology

## 2021-08-13 ENCOUNTER — Telehealth (HOSPITAL_COMMUNITY): Payer: Self-pay

## 2021-08-13 ENCOUNTER — Other Ambulatory Visit (HOSPITAL_COMMUNITY): Payer: Self-pay | Admitting: Hematology and Oncology

## 2021-08-13 DIAGNOSIS — Z431 Encounter for attention to gastrostomy: Secondary | ICD-10-CM

## 2021-08-13 NOTE — Telephone Encounter (Signed)
Pt called to schedule peg replacement, she has a lot going on this week. I will call her back on Friday after she speaks with her daughter to get her scheduled. AW  ?

## 2021-08-20 ENCOUNTER — Telehealth (HOSPITAL_COMMUNITY): Payer: Self-pay

## 2021-08-20 NOTE — Telephone Encounter (Signed)
Called to schedule peg replacement, no answer, left vm. AW  ?

## 2021-08-25 ENCOUNTER — Other Ambulatory Visit: Payer: Self-pay | Admitting: *Deleted

## 2021-08-25 MED ORDER — LORAZEPAM 0.5 MG PO TABS
0.5000 mg | ORAL_TABLET | Freq: Three times a day (TID) | ORAL | 0 refills | Status: DC
Start: 2021-08-25 — End: 2021-10-24

## 2021-08-25 NOTE — Progress Notes (Signed)
Received call from pt requesting prescription for Lorazepam prior to upcoming PEG tube replacement due to increase in anxiety.  Verbal orders received from MD for pt to receive Lorazepam 0.5 mg p.o 1 hour prior to imaging.  May repeat X 1 if needed.  Prescription called in to pharmacy on file. Pt educated on medication instructions and verbalized understanding.  ?

## 2021-08-28 ENCOUNTER — Telehealth: Payer: Self-pay | Admitting: *Deleted

## 2021-08-28 NOTE — Telephone Encounter (Signed)
Pt called wanting to know about getting the CXR done that Dr. Madilyn Fireman ordered months ago. Wanted to know if she needed a new order. Pt was informed that she can go whenever and that she doesn't need another order.  ?

## 2021-08-29 ENCOUNTER — Encounter (HOSPITAL_COMMUNITY): Payer: Self-pay | Admitting: Radiology

## 2021-08-29 ENCOUNTER — Ambulatory Visit (HOSPITAL_COMMUNITY)
Admission: RE | Admit: 2021-08-29 | Discharge: 2021-08-29 | Disposition: A | Discharge status: Home / Self Care | Payer: Medicare Other | Source: Ambulatory Visit | Attending: Hematology and Oncology | Admitting: Hematology and Oncology

## 2021-08-29 ENCOUNTER — Other Ambulatory Visit (HOSPITAL_COMMUNITY): Payer: Self-pay | Admitting: Hematology and Oncology

## 2021-08-29 DIAGNOSIS — Z431 Encounter for attention to gastrostomy: Secondary | ICD-10-CM

## 2021-08-29 HISTORY — PX: IR PATIENT EVAL TECH 0-60 MINS: IMG5564

## 2021-08-29 NOTE — Procedures (Signed)
Jodi Kaufman and her daughter here today for gastrostomy tube evaluation.  Tube in good working order, not clogged nor leaking. Patient concerned with black mark on hub of tube.  After discussion, it was decided to clean tube and replace hub.  This was done and patient was satisfied with outcome.  It was noted that small granulated tissue was bleeding under retention bumper. Advised patient to reach out to [place] if continued to bleed, also advised not to place gauze under bumper as to not wick liquids. ? ?

## 2021-09-08 ENCOUNTER — Telehealth (INDEPENDENT_AMBULATORY_CARE_PROVIDER_SITE_OTHER): Payer: Medicare Other | Admitting: Physician Assistant

## 2021-09-08 ENCOUNTER — Encounter: Payer: Self-pay | Admitting: Physician Assistant

## 2021-09-08 ENCOUNTER — Telehealth: Payer: Self-pay | Admitting: Neurology

## 2021-09-08 VITALS — BP 138/70 | Temp 98.5°F

## 2021-09-08 DIAGNOSIS — J439 Emphysema, unspecified: Secondary | ICD-10-CM | POA: Diagnosis not present

## 2021-09-08 DIAGNOSIS — Z978 Presence of other specified devices: Secondary | ICD-10-CM | POA: Diagnosis not present

## 2021-09-08 DIAGNOSIS — R058 Other specified cough: Secondary | ICD-10-CM | POA: Diagnosis not present

## 2021-09-08 MED ORDER — OMEPRAZOLE 40 MG PO CPDR: 40.0000 mg | DELAYED_RELEASE_CAPSULE | Freq: Every day | ORAL | 2 refills | Status: DC

## 2021-09-08 MED ORDER — UMECLIDINIUM-VILANTEROL 62.5-25 MCG/ACT IN AEPB: 1.0000 | INHALATION_SPRAY | Freq: Every day | RESPIRATORY_TRACT | 2 refills | Status: DC

## 2021-09-08 MED ORDER — PREDNISONE 20 MG PO TABS: 20.0000 mg | ORAL_TABLET | Freq: Two times a day (BID) | ORAL | 0 refills | Status: DC

## 2021-09-08 NOTE — Telephone Encounter (Signed)
Per Luvenia Starch: Discussed with Dr. Madilyn Fireman and they decided to send over prednisone twice a day with food for 5 days, start anoro an inhaler 1 puff daily and omeprazole an acid reducer and see if tackling any reflux and inflammation is the key to you getting better. SHE needs to schedule to see Dr. Jerilynn Mages in 3 weeks for her to recheck her lungs and see how she is doing. this appt needs to be in person.  ? ? ?Tried to call patient to let her know the above, no answer, vm not set up.  ? ?

## 2021-09-08 NOTE — Telephone Encounter (Signed)
Spoke with patient. She was very frustrated with this plan. She has a hard time coming into the office due to having a ride. Her car is broken down and her daughter can't take off work. She is just very frustrated with her symptoms. I did speak to her about medications and what they are treating. She has a fear of inhaler and I offered to prescribe an aerochamber to help with medication delivery. She wants to discuss with daughter and let us know if she wants this prescribed. She will try these medications and see if this gets better in 5 days. If she gets worse or develops fever in the interim she will call back and let us know.  ?

## 2021-09-08 NOTE — Progress Notes (Signed)
..Virtual Visit via Telephone Note ? ?I connected with [place] on 09/08/21 at  8:10 AM EDT by telephone and verified that I am speaking with the correct person using two identifiers. ? ?Location: ?Patient: home ?Provider: clinic ? ?Marland Kitchen.Participating in visit:  ?Patient: Jodi Kaufman ?Provider: Iran Planas PA-C ?  ?I discussed the limitations, risks, security and privacy concerns of performing an evaluation and management service by telephone and the availability of in person appointments. I also discussed with the patient that there may be a patient responsible charge related to this service. The patient expressed understanding and agreed to proceed. ? ? 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 She has been treated with multiple rounds of abx since October 2022. She does feel like she gets some better with abx but then symptoms come right back. Her feeding tube was recently replaced because of some infection in the tip. She wonders if that had anything to do with the mucus. She denies any fever, chills, body aches. She does have a ST. Dr. Madilyn Fireman had ordered CXR back in march but patient could not get a ride. She does not take allergy medications because "they make her mouth too sticky". She describes mucus as green and coming from chest. She has not tried inhalers. She is not on medications for reflux.  ? ?.. ?Active Ambulatory Problems  ?  Diagnosis Date Noted  ? Overactive bladder 10/12/2011  ? History of right breast cancer 10/12/2011  ? Vitamin D deficiency 11/06/2011  ? GAD (generalized anxiety disorder) 02/09/2018  ? Hyperlipidemia 05/15/2018  ? History of osteopenia 11/27/2015  ? GERD (gastroesophageal reflux disease) 11/27/2015  ? Recurrent UTI 05/17/2019  ? Rheumatoid factor positive 07/06/2019  ? Family history of colon cancer  08/16/2019  ? Mild emphysema ([place]) 08/16/2019  ? Discrete lymph node 08/16/2019  ? RUQ pain 11/09/2019  ? Abnormal weight loss 11/09/2019  ? Metastatic breast cancer 04/01/2020  ? Failure to thrive in adult 04/10/2020  ? Dehydration   ? Abnormal liver enzymes   ? Protein-calorie malnutrition, severe 04/11/2020  ? Encounter for PEG (percutaneous endoscopic gastrostomy) ([place])   ? 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([place]) 08/23/2020  ? Cough productive of purulent sputum 09/08/2021  ? Uses feeding tube 09/08/2021  ? ?Resolved Ambulatory Problems  ?  Diagnosis Date Noted  ? Anxiety 05/15/2018  ? ?Past Medical History:  ?Diagnosis Date  ? Breast cancer ([place])   ? ? ? ?  ?Observations/Objective: ?No acute distress ?Productive cough ?No labored breathing ? ?.. ?Today's Vitals  ? 09/08/21 0818  ?BP: 138/70  ?Temp: 98.5 ?F (36.9 ?C)  ?TempSrc: Oral  ? ?There is no height or weight on file to calculate BMI. ? ? ?Assessment and Plan: ?..Jodi Kaufman was seen today for follow-up. ? ?Diagnoses and all orders for this visit: ? ?Cough productive of purulent sputum ?-     umeclidinium-vilanterol (ANORO ELLIPTA) 62.5-25 MCG/ACT AEPB; Inhale 1 puff into the lungs daily at 6 (six) AM. ?-     omeprazole (PRILOSEC) 40 MG capsule; Take 1 capsule (40 mg total) by mouth daily. ?-     predniSONE (DELTASONE) 20 MG tablet; Take 1 tablet (20 mg total) by mouth 2 (two) times daily with a meal. ? ?Uses feeding tube ? ?Mild emphysema (HCC) ?-     umeclidinium-vilanterol (ANORO ELLIPTA) 62.5-25  MCG/ACT AEPB; Inhale 1 puff into the lungs daily at 6 (six) AM. ?-     predniSONE (DELTASONE) 20 MG tablet; Take 1 tablet (20 mg total) by mouth 2 (two) times daily with a meal. ? ?Discussed plan with Dr. Madilyn Fireman and she agrees.  ? ?Pt has had symptoms like this since last fall and been given multiple antibiotics ?Today lets try tackling the mucus from another source ?She does have emphysema and she is using a feeding tube and could be some reflux  concerns ?Start prednisone for 5 days ?Start omeprazole daily in the morning ?Start anoro 1 puff daily  ?Follow up with Dr. Madilyn Fireman in office in next 2-4 weeks.  ?If not improving needs imaging.  ? ? ? ? ?Follow Up Instructions: ? ?  ?I discussed the assessment and treatment plan with the patient. The patient was provided an opportunity to ask questions and all were answered. The patient agreed with the plan and demonstrated an understanding of the instructions. ?  ?The patient was advised to call back or seek an in-person evaluation if the symptoms worsen or if the condition fails to improve as anticipated. ? ?I provided 20 minutes of non-face-to-face time during this encounter. ? ? ?Iran Planas, PA-C ? ?

## 2021-09-17 ENCOUNTER — Ambulatory Visit (INDEPENDENT_AMBULATORY_CARE_PROVIDER_SITE_OTHER): Payer: Medicare Other

## 2021-09-17 ENCOUNTER — Ambulatory Visit (INDEPENDENT_AMBULATORY_CARE_PROVIDER_SITE_OTHER): Payer: Medicare Other | Admitting: Family Medicine

## 2021-09-17 VITALS — BP 120/69 | HR 89 | Resp 18 | Ht 66.0 in | Wt 137.0 lb

## 2021-09-17 DIAGNOSIS — R300 Dysuria: Secondary | ICD-10-CM

## 2021-09-17 DIAGNOSIS — R051 Acute cough: Secondary | ICD-10-CM

## 2021-09-17 DIAGNOSIS — J411 Mucopurulent chronic bronchitis: Secondary | ICD-10-CM

## 2021-09-17 LAB — POCT URINALYSIS DIP (CLINITEK)
Bilirubin, UA: NEGATIVE
Blood, UA: NEGATIVE
Glucose, UA: NEGATIVE mg/dL
Ketones, POC UA: NEGATIVE mg/dL
Nitrite, UA: NEGATIVE
POC PROTEIN,UA: 100 — AB
Spec Grav, UA: 1.02 (ref 1.010–1.025)
Urobilinogen, UA: 1 E.U./dL
pH, UA: 7.5 (ref 5.0–8.0)

## 2021-09-17 NOTE — Progress Notes (Signed)
Patient complains of urinary burning and hesitancy for 2 weeks. Patient denies any other symptoms.  Urinalysis negative for nitrites.  Will send for culture for confirmation.  Encourage patient to hydrate well.

## 2021-09-17 NOTE — Progress Notes (Signed)
Patient advised of message and verbalized understanding.  

## 2021-09-18 NOTE — Progress Notes (Signed)
[place], No pneumonia so no need for an antibiotic.  Is she using her inhalers?we can get her in with a lung specialist.

## 2021-09-19 ENCOUNTER — Other Ambulatory Visit: Payer: Self-pay | Admitting: *Deleted

## 2021-09-19 ENCOUNTER — Telehealth: Payer: Self-pay | Admitting: *Deleted

## 2021-09-19 DIAGNOSIS — Z170 Estrogen receptor positive status [ER+]: Secondary | ICD-10-CM

## 2021-09-19 LAB — URINE CULTURE
MICRO NUMBER:: 13444084
SPECIMEN QUALITY:: ADEQUATE

## 2021-09-19 MED ORDER — STIOLTO RESPIMAT 2.5-2.5 MCG/ACT IN AERS: 2.0000 | INHALATION_SPRAY | Freq: Every day | RESPIRATORY_TRACT | 2 refills | Status: DC

## 2021-09-19 MED ORDER — SULFAMETHOXAZOLE-TRIMETHOPRIM 800-160 MG PO TABS
1.0000 | ORAL_TABLET | Freq: Two times a day (BID) | ORAL | 0 refills | Status: DC
Start: 2021-09-19 — End: 2021-10-01

## 2021-09-19 NOTE — Progress Notes (Signed)
Hi [place],  Your urine culture came back positive for a UTI.  I  and sending over an antibiotic for the infection.

## 2021-09-19 NOTE — Telephone Encounter (Signed)
Received call from pt with complaint of ongoing thick green secretions around peg tub x several weeks.  Pt also complaint of ongoing cough with thick green mucus x 8 weeks. Pt denies fever at this time. Pt states workup with PCP has been negative and requesting to be seen my MD.  Verbal orders received from MD to obtain repeat chest Xray, labs and f/u with [facility].  RN also LVM with IR regarding feeding tube secretions.  Appts scheduled, pt verbalized understanding of appt date and time.

## 2021-09-19 NOTE — Addendum Note (Signed)
Addended by: [name] D on: 09/19/2021 07:16 PM   Modules accepted: Orders

## 2021-09-19 NOTE — Progress Notes (Signed)
Okay, we will see if we can maybe find something a little bit more reasonable.  We are not privy to cost information so if it is costing a lot she will have to reach back out and let us know that she was not able to pick it up or afford it.  No need or indication for an antibiotic.  That is why we did the chest x-ray was to see if she had a reason to take another round.  She does not need it.  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  Inhalers will often help with the chronic cough and mucus production.  It may not clear at 100% but it should help if we can get her on the right regimen.  Orders Placed This Encounter     Tiotropium Bromide-Olodaterol (STIOLTO RESPIMAT) 2.5-2.5 MCG/ACT AERS         Sig: Inhale 2 puffs into the lungs daily.         Dispense:  4 g         Refill:  2

## 2021-09-23 ENCOUNTER — Other Ambulatory Visit: Payer: Self-pay

## 2021-09-23 ENCOUNTER — Ambulatory Visit (HOSPITAL_COMMUNITY)
Admission: RE | Admit: 2021-09-23 | Discharge: 2021-09-23 | Disposition: A | Discharge status: Home / Self Care | Payer: Medicare Other | Source: Ambulatory Visit | Attending: Hematology and Oncology | Admitting: Hematology and Oncology

## 2021-09-23 ENCOUNTER — Inpatient Hospital Stay (HOSPITAL_BASED_OUTPATIENT_CLINIC_OR_DEPARTMENT_OTHER): Payer: Medicare Other | Admitting: Physician Assistant

## 2021-09-23 ENCOUNTER — Other Ambulatory Visit (HOSPITAL_COMMUNITY): Payer: Self-pay | Admitting: Radiology

## 2021-09-23 ENCOUNTER — Ambulatory Visit (HOSPITAL_COMMUNITY): Admission: RE | Admit: 2021-09-23 | Payer: Medicare Other | Source: Ambulatory Visit

## 2021-09-23 ENCOUNTER — Inpatient Hospital Stay: Payer: Medicare Other | Attending: Hematology and Oncology

## 2021-09-23 VITALS — BP 114/75 | HR 82 | Temp 98.5°F | Resp 18 | Wt 138.2 lb

## 2021-09-23 DIAGNOSIS — Z79811 Long term (current) use of aromatase inhibitors: Secondary | ICD-10-CM | POA: Insufficient documentation

## 2021-09-23 DIAGNOSIS — C50412 Malignant neoplasm of upper-outer quadrant of left female breast: Secondary | ICD-10-CM | POA: Diagnosis present

## 2021-09-23 DIAGNOSIS — Z170 Estrogen receptor positive status [ER+]: Secondary | ICD-10-CM

## 2021-09-23 DIAGNOSIS — R059 Cough, unspecified: Secondary | ICD-10-CM | POA: Insufficient documentation

## 2021-09-23 DIAGNOSIS — R633 Feeding difficulties, unspecified: Secondary | ICD-10-CM

## 2021-09-23 DIAGNOSIS — C50911 Malignant neoplasm of unspecified site of right female breast: Secondary | ICD-10-CM | POA: Insufficient documentation

## 2021-09-23 DIAGNOSIS — C7951 Secondary malignant neoplasm of bone: Secondary | ICD-10-CM | POA: Diagnosis not present

## 2021-09-23 DIAGNOSIS — Z923 Personal history of irradiation: Secondary | ICD-10-CM | POA: Insufficient documentation

## 2021-09-23 DIAGNOSIS — C7931 Secondary malignant neoplasm of brain: Secondary | ICD-10-CM | POA: Insufficient documentation

## 2021-09-23 LAB — CBC WITH DIFFERENTIAL (CANCER CENTER ONLY)
Abs Immature Granulocytes: 0.01 10*3/uL (ref 0.00–0.07)
Basophils Absolute: 0 10*3/uL (ref 0.0–0.1)
Basophils Relative: 1 %
Eosinophils Absolute: 0.2 10*3/uL (ref 0.0–0.5)
Eosinophils Relative: 3 %
HCT: 43.6 % (ref 36.0–46.0)
Hemoglobin: 14.6 g/dL (ref 12.0–15.0)
Immature Granulocytes: 0 %
Lymphocytes Relative: 10 %
Lymphs Abs: 0.6 10*3/uL — ABNORMAL LOW (ref 0.7–4.0)
MCH: 32.2 pg (ref 26.0–34.0)
MCHC: 33.5 g/dL (ref 30.0–36.0)
MCV: 96.2 fL (ref 80.0–100.0)
Monocytes Absolute: 0.7 10*3/uL (ref 0.1–1.0)
Monocytes Relative: 11 %
Neutro Abs: 4.7 10*3/uL (ref 1.7–7.7)
Neutrophils Relative %: 75 %
Platelet Count: 189 10*3/uL (ref 150–400)
RBC: 4.53 MIL/uL (ref 3.87–5.11)
RDW: 13.8 % (ref 11.5–15.5)
WBC Count: 6.2 10*3/uL (ref 4.0–10.5)
nRBC: 0 % (ref 0.0–0.2)

## 2021-09-23 LAB — CMP (CANCER CENTER ONLY)
ALT: 18 U/L (ref 0–44)
AST: 23 U/L (ref 15–41)
Albumin: 4.1 g/dL (ref 3.5–5.0)
Alkaline Phosphatase: 59 U/L (ref 38–126)
Anion gap: 4 — ABNORMAL LOW (ref 5–15)
BUN: 29 mg/dL — ABNORMAL HIGH (ref 8–23)
CO2: 31 mmol/L (ref 22–32)
Calcium: 9.7 mg/dL (ref 8.9–10.3)
Chloride: 102 mmol/L (ref 98–111)
Creatinine: 0.82 mg/dL (ref 0.44–1.00)
GFR, Estimated: >60 mL/min (ref >60)
Glucose, Bld: 109 mg/dL — ABNORMAL HIGH (ref 70–99)
Potassium: 5 mmol/L (ref 3.5–5.1)
Sodium: 137 mmol/L (ref 135–145)
Total Bilirubin: 0.6 mg/dL (ref 0.3–1.2)
Total Protein: 6.9 g/dL (ref 6.5–8.1)

## 2021-09-23 NOTE — Patient Instructions (Addendum)
    Please call Tiffany in Interventional radiology  at [phone] or [phone] to reschedule appointment to have your tube checked    Take the bactrim for your UTI.   Take the omeprazole to see if it helps with th cough/phlegm

## 2021-09-23 NOTE — Progress Notes (Signed)
Symptom Management Consult note [place]    Patient Care Team: Hali Marry, MD as PCP - General (Family Medicine)    Name of the patient: Jodi Kaufman  [id number]  1941-03-26   Date of visit: 09/23/2021    Chief complaint/ Reason for visit- cough  Oncology History  Metastatic breast cancer  1995 Initial Biopsy   Right breast cancer stage IIb ER positive IDC s/p mastectomy and 2 months of tamoxifen, lost to follow-up (treatment at [name])   03/12/2020 - 03/26/2020 Radiation Therapy   Palliative radiation to the brain and spine for brain metastases   03/14/2020 Initial Diagnosis   Scans on 03/14/2020: Multiple bone metastases and brain metastases, biopsy of the rib: Breast cancer ER 80% PR negative HER-2 negative.  Moved to [facility] December 2021 to be closer to her family     Current Therapy: Letrozole and most recent Xgeva injection 07/18/21  Interval history- Jodi Kaufman is an 81 yo female with oncologic history as above presenting to [facility] with chief complaint of cough x 8 weeks. Patient's daughter accompanies her today and contributes to history. Patient has the cough daily. She describes it as mostly dry however in the morning and evening after her tube feeds the cough is productive with green phlegm. She denies any associated chest pain or shortness of breath. She has seen PCP for the cough and was prescribed prednisone, inhalers and omeprazole. She does not want to use the inhalers and did not start taking either of the pills prescribed because she was unsure why it was she needed them. Patient was prescribed cefdinir for the cough in 03/23, she denies any improvement with with antibiotic. She denies any history of COPD or emphysema although mild emphysema is listed in her medical history. She is a non smoker. She denies any fever, congestion, wheezing, or sick contacts.  Chart review shows PCP prescribed on Bactrim (5/26) for UTI  from recent urine culture growth. She was unaware and did not pick up antibiotic from pharmacy.   ROS  All other systems are reviewed and are negative for acute change except as noted in the HPI.    Allergies  Allergen Reactions   Atorvastatin     Other reaction(s): Other (comments) Side effects   Clavulanic Acid Hives   Guaifenesin & Derivatives Other (See Comments)    Stomach upset, severe  Patient states she is not allergic.    Tamoxifen Nausea Only     Past Medical History:  Diagnosis Date   Breast cancer [facility])      Past Surgical History:  Procedure Laterality Date   BREAST BIOPSY     IR GASTROSTOMY TUBE MOD SED  04/15/2020   IR PATIENT EVAL TECH 0-60 MINS  08/22/2020   IR PATIENT EVAL TECH 0-60 MINS  08/29/2021   MASTECTOMY     MASTECTOMY W/ SENTINEL NODE BIOPSY     Right. 18 years ago.      Social History   Socioeconomic History   Marital status: Single    Spouse name: Not on file   Number of children: Not on file   Years of education: Not on file   Highest education level: Not on file  Occupational History   Not on file  Tobacco Use   Smoking status: Never   Smokeless tobacco: Never  Substance and Sexual Activity   Alcohol use: Never    Comment: [place].     Drug use: Never  Sexual activity: Not Currently  Other Topics Concern   Not on file  Social History Narrative   Lives in [place] but stays with her daughter 1-2 weeks out of the month.     Social Determinants of Health   Financial Resource Strain: Not on file  Food Insecurity: Not on file  Transportation Needs: Not on file  Physical Activity: Not on file  Stress: Not on file  Social Connections: Not on file  Intimate Partner Violence: Not on file    Family History  Problem Relation Age of Onset   Heart disease Mother    Anemia Mother        penicous anemia.    Alzheimer's disease Mother      Current Outpatient Medications:    acetaminophen (TYLENOL) 160 MG/5ML liquid, Take 500 mg  by mouth every 4 (four) hours as needed for fever., Disp: , Rfl:    calcium citrate-vitamin D (CITRACAL+D) 315-200 MG-UNIT tablet, Take 1 tablet by mouth daily., Disp: , Rfl:    letrozole (FEMARA) 2.5 MG tablet, TAKE 1 TABLET BY MOUTH EVERY DAY, Disp: 90 tablet, Rfl: 3   LORazepam (ATIVAN) 0.5 MG tablet, Take 1 tablet (0.5 mg total) by mouth every 8 (eight) hours. Take 1 tablet (0.$RemoveBef'5mg'MZhoPePNfK$ ) by mouth 1 hour prior to PEG tube placement.  May repeat X 1 if needed. (Patient not taking: Reported on 09/17/2021), Disp: 2 tablet, Rfl: 0   Nutritional Supplements (FEEDING SUPPLEMENT, OSMOLITE 1.5 CAL,) LIQD, Place 120 mLs into feeding tube 5 (five) times daily. Advance to 285ml 5 times a day if tolerated, Disp: 1000 mL, Rfl: 20   sulfamethoxazole-trimethoprim (BACTRIM DS) 800-160 MG tablet, Take 1 tablet by mouth 2 (two) times daily., Disp: 6 tablet, Rfl: 0   Tiotropium Bromide-Olodaterol (STIOLTO RESPIMAT) 2.5-2.5 MCG/ACT AERS, Inhale 2 puffs into the lungs daily., Disp: 4 g, Rfl: 2   VENTOLIN HFA 108 (90 Base) MCG/ACT inhaler, TAKE 2 PUFFS BY MOUTH EVERY 6 HOURS AS NEEDED FOR WHEEZE OR SHORTNESS OF BREATH, Disp: 18 each, Rfl: 1  PHYSICAL EXAM: ECOG FS:1 - Symptomatic but completely ambulatory    Vitals:   09/23/21 1252 09/23/21 1305  BP: 114/75   Pulse: 82   Resp: 18   Temp:  98.5 F (36.9 C)  TempSrc:  Oral  SpO2: 99%   Weight: 138 lb 3.2 oz (62.7 kg)    Physical Exam Vitals and nursing note reviewed.  Constitutional:      Appearance: She is well-developed. She is not ill-appearing or toxic-appearing.  HENT:     Head: Normocephalic and atraumatic.     Nose: Nose normal.  Eyes:     General: No scleral icterus.       Right eye: No discharge.        Left eye: No discharge.     Conjunctiva/sclera: Conjunctivae normal.  Neck:     Vascular: No JVD.  Cardiovascular:     Rate and Rhythm: Normal rate and regular rhythm.     Pulses: Normal pulses.     Heart sounds: Normal heart sounds.   Pulmonary:     Effort: Pulmonary effort is normal. No respiratory distress.     Breath sounds: Normal breath sounds. No stridor. No wheezing, rhonchi or rales.  Chest:     Chest wall: No tenderness.  Abdominal:     General: There is no distension.     Comments: G tube without surrounding signs of infection  Musculoskeletal:        General:  Normal range of motion.     Cervical back: Normal range of motion.  Skin:    General: Skin is warm and dry.  Neurological:     Mental Status: She is oriented to person, place, and time.     GCS: GCS eye subscore is 4. GCS verbal subscore is 5. GCS motor subscore is 6.     Comments: Fluent speech, no facial droop.  Psychiatric:        Behavior: Behavior normal.       LABORATORY DATA: I have reviewed the data as listed    Latest Ref Rng & Units 09/23/2021   12:26 PM 07/15/2021    4:16 PM 04/14/2021   10:21 AM  CBC  WBC 4.0 - 10.5 K/uL 6.2   4.0   6.5    Hemoglobin 12.0 - 15.0 g/dL 14.6   14.3   14.7    Hematocrit 36.0 - 46.0 % 43.6   43.7   44.1    Platelets 150 - 400 K/uL 189   229   205          Latest Ref Rng & Units 09/23/2021   12:26 PM 07/15/2021    4:16 PM 04/14/2021   10:21 AM  CMP  Glucose 70 - 99 mg/dL 109   98   101    BUN 8 - 23 mg/dL $Remove'29   28   28    'gLYVqXb$ Creatinine 0.44 - 1.00 mg/dL 0.82   0.83   0.82    Sodium 135 - 145 mmol/L 137   137   141    Potassium 3.5 - 5.1 mmol/L 5.0   4.6   4.5    Chloride 98 - 111 mmol/L 102   103   106    CO2 22 - 32 mmol/L $RemoveB'31   28   28    'ADNfnyXB$ Calcium 8.9 - 10.3 mg/dL 9.7   9.5   9.4    Total Protein 6.5 - 8.1 g/dL 6.9   6.9   7.0    Total Bilirubin 0.3 - 1.2 mg/dL 0.6   0.6   0.8    Alkaline Phos 38 - 126 U/L 59   64   77    AST 15 - 41 U/L $Remo'23   23   25    'TOviK$ ALT 0 - 44 U/L $Remo'18   21   22         'gmbUD$ RADIOGRAPHIC STUDIES: I have personally reviewed the radiological images as listed and agreed with the findings in the report. No images are attached to the encounter. DG Chest 2 View  Result  Date: 09/23/2021 CLINICAL DATA:  Productive cough for several weeks. Metastatic breast carcinoma. EXAM: CHEST - 2 VIEW COMPARISON:  09/17/2021 FINDINGS: The heart size and mediastinal contours are within normal limits. Both lungs are clear. Patient has undergone previous right mastectomy and axillary lymph node dissection. Multiple bone metastases are again seen involving the ribs, thoracic spine, and left scapula. The visualized skeletal structures are unremarkable. IMPRESSION: No active cardiopulmonary disease. Multiple bone metastases, without significant change. Electronically Signed   By: Marlaine Hind M.D.   On: 09/23/2021 11:59   DG Chest 2 View  Result Date: 09/18/2021 CLINICAL DATA:  Cough. Green sputum. Breast cancer with known bone metastases. EXAM: CHEST - 2 VIEW COMPARISON:  PA chest and right rib radiographs 08/16/2019; CT chest 04/15/2021 FINDINGS: Cardiac silhouette and mediastinal contours within normal limits. Mild calcification within aortic arch. There are again increased  lucencies within the bilateral lungs suggesting chronic emphysematous changes. No focal airspace opacity. No pleural effusion or pneumothorax. Mild T7, superior T8, and inferior T12 vertebral body height loss and/or degenerative Schmorl's nodes, unchanged from 04/15/2021 CT. There are sclerotic metastases again seen within multiple thoracic vertebral bodies, including T7, T9, and T11. Mild superior endplate concavity again seen at L2. There are oval expansile, sclerotic lesions corresponding to the known bone metastases seen within the anterolateral right second rib, far anterior right second rib and lateral right eighth rib. These are again largest within the anterolateral aspect of the right second rib. IMPRESSION: 1. No acute cardiopulmonary disease process. 2. Diffuse thoracic vertebral body and multiple right rib bone metastases as seen on prior CT. Electronically Signed   By: Yvonne Kendall M.D.   On: 09/18/2021 11:17    IR PATIENT EVAL TECH 0-60 MINS  Result Date: 08/29/2021 Kathrin Penner     08/29/2021 12:11 PM Ms Darreld Mclean and her daughter here today for gastrostomy tube evaluation.  Tube in good working order, not clogged nor leaking. Patient concerned with black mark on hub of tube.  After discussion, it was decided to clean tube and replace hub.  This was done and patient was satisfied with outcome.  It was noted that small granulated tissue was bleeding under retention bumper. Advised patient to reach out to [place] if continued to bleed, also advised not to place gauze under bumper as to not wick liquids.     ASSESSMENT & PLAN: Patient is a 81 y.o. female  with oncologic history of metastatic breast cancer followed by Dr. Lindi Adie.  I have viewed most recent oncology and pcp notes and lab work.   Visit Diagnosis: 1. Cough, unspecified type      #)Cough- patient nontoxic, well appearing. VSS. Chest xray today without acute infectious process. I viewed imaging and agree with radiologist impression. Labs today include CBC and CMP which are overall unremarkable. Lung exam is also unremarkable. Symptoms are suggestive of GERD and strongly encouraged patient to take omeprazole as prescribed by PCP to attempt symptom control. Patient and daughter agreeable with plan.     No orders of the defined types were placed in this encounter.   All questions were answered. The patient knows to call the clinic with any problems, questions or concerns. No barriers to learning was detected.  I have spent a total of 20 minutes minutes of face-to-face and non-face-to-face time, preparing to see the patient, obtaining and/or reviewing separately obtained history, performing a medically appropriate examination, counseling and educating the patient, ordering tests,  documenting clinical information in the electronic health record, and care coordination.     Thank you for allowing me to participate in the care of this patient.     Barrie Folk, PA-C Department of Hematology/Oncology [facility] at [facility] Phone: [phone]  [phone]    09/23/2021 3:52 PM

## 2021-09-24 ENCOUNTER — Ambulatory Visit (HOSPITAL_COMMUNITY)
Admission: RE | Admit: 2021-09-24 | Discharge: 2021-09-24 | Disposition: A | Discharge status: Home / Self Care | Payer: Medicare Other | Source: Ambulatory Visit | Attending: Radiology | Admitting: Radiology

## 2021-09-24 ENCOUNTER — Encounter (HOSPITAL_COMMUNITY): Payer: Self-pay | Admitting: Radiology

## 2021-09-24 DIAGNOSIS — Z431 Encounter for attention to gastrostomy: Secondary | ICD-10-CM | POA: Insufficient documentation

## 2021-09-24 DIAGNOSIS — R633 Feeding difficulties, unspecified: Secondary | ICD-10-CM | POA: Diagnosis not present

## 2021-09-24 HISTORY — PX: IR PATIENT EVAL TECH 0-60 MINS: IMG5564

## 2021-09-24 MED ORDER — SILVER NITRATE-POT NITRATE 75-25 % EX MISC: 2.0000 | Freq: Once | CUTANEOUS | Status: AC

## 2021-09-24 MED ORDER — SILVER NITRATE-POT NITRATE 75-25 % EX MISC
CUTANEOUS | Status: AC
  Administered 2021-09-24: 2 via TOPICAL
  Filled 2021-09-24: qty 10

## 2021-09-24 NOTE — Procedures (Signed)
Pt was seen regarding leaking and bleeding at g-tube site.daughter Mechele Claude present at bedside. Assisted PA in treating granuloma tissue with silver nitrate, adjusting bumper until secure around 3cm marking. Fresh gauze bandage applied.

## 2021-09-29 ENCOUNTER — Telehealth: Payer: Self-pay | Admitting: Hematology and Oncology

## 2021-09-29 NOTE — Telephone Encounter (Signed)
Rescheduled appointment (6/23) per provider PAL. Left message. Also move 6/20 lab only appt to 6/21 due to patient getting a CT scan. Labs to be done 1 hours before scan. Left this information in the same message left regarding 6/23 rescheduled appointment.

## 2021-09-30 ENCOUNTER — Telehealth: Payer: Self-pay | Admitting: Hematology and Oncology

## 2021-09-30 NOTE — Telephone Encounter (Signed)
Rescheduled 6/21 appointment per patients request. Patient called Jodi Nicely RN verY upset about why appointments per moved (per provider PAL). Scheduler called patient and explained why things were moved. Per the patients request, 6/21 appt was moved back to 6/21. Patients 6/30 appt was not moved. It was explained to the patient why that appt was moved in the first place and she is aware and has a better understanding. Scheduler informed patient that she can call if she needs anything in the meantime before her next scheduled appointment.

## 2021-10-01 ENCOUNTER — Telehealth: Payer: Self-pay | Admitting: *Deleted

## 2021-10-01 NOTE — Telephone Encounter (Signed)
The antibiotic was written  2 weeks ago.  So why is she calling now?

## 2021-10-01 NOTE — Telephone Encounter (Signed)
Attempted to contact patient. Unable to leave message/voicemail box is full.

## 2021-10-01 NOTE — Telephone Encounter (Signed)
Pt called and stated that she had a reaction to the bactrim and only took 1.5 days of this.   She is asking that something else be sent in for her UTI.

## 2021-10-02 MED ORDER — NITROFURANTOIN MONOHYD MACRO 100 MG PO CAPS: 100.0000 mg | ORAL_CAPSULE | Freq: Two times a day (BID) | ORAL | 0 refills | Status: DC

## 2021-10-02 NOTE — Telephone Encounter (Signed)
Meds ordered this encounter  Medications   nitrofurantoin, macrocrystal-monohydrate, (MACROBID) 100 MG capsule    Sig: Take 1 capsule (100 mg total) by mouth 2 (two) times daily.    Dispense:  10 capsule    Refill:  0   Prescription sent for the UTI based on last culture results.  In regards to the congestion and cough this was addressed by oncology on May 30 and they agreed they did not feel like it was related to her direct infection and it was probably somewhat GERD related and recommended that she take her reflux medicine consistently.

## 2021-10-02 NOTE — Telephone Encounter (Signed)
Spoke with patient. Prescription for Bactrim sent on 09/19/21. Patient stated she did not pick up the Bactrim right away. Patient stated she did not start the Bactrim until 09/23/29 and took 1 day of it only. Patient stated she is still having nasal congestion, cough and urinary burning.

## 2021-10-02 NOTE — Telephone Encounter (Signed)
Patient advised of message and verbalized understanding.  

## 2021-10-10 NOTE — Progress Notes (Signed)
Patient Care Team: Hali Marry, MD as PCP - General (Family Medicine)  DIAGNOSIS:  Encounter Diagnosis  Name Primary?   Primary malignant neoplasm of breast with metastasis ([place]) Yes    SUMMARY OF ONCOLOGIC HISTORY: Oncology History  Primary malignant neoplasm of breast with metastasis ([place])  1995 Initial Biopsy   Right breast cancer stage IIb ER positive IDC s/p mastectomy and 2 months of tamoxifen, lost to follow-up (treatment at [facility])   03/12/2020 - 03/26/2020 Radiation Therapy   Palliative radiation to the brain and spine for brain metastases   03/14/2020 Initial Diagnosis   Scans on 03/14/2020: Multiple bone metastases and brain metastases, biopsy of the rib: Breast cancer ER 80% PR negative HER-2 negative.  Moved to [facility] December 2021 to be closer to her family     CHIEF COMPLIANT: Follow-up metastatic breast cancer currently on letrozole.  INTERVAL HISTORY: Jodi Kaufman is a 81 y.o. with above-mentioned history of metastatic breast cancer currently on letrozole. She presents to the clinic today for a follow-up.  She had recently undergone CT scans and is here to discuss results.  She is in a wheelchair and reports that she continues to feel weak and tired but able to manage well.  Does not have any new onset of pain or discomfort.   ALLERGIES:  is allergic to atorvastatin, bactrim [sulfamethoxazole-trimethoprim], clavulanic acid, guaifenesin & derivatives, and tamoxifen.  MEDICATIONS:  Current Outpatient Medications  Medication Sig Dispense Refill   anastrozole (ARIMIDEX) 1 MG tablet Take 1 tablet (1 mg total) by mouth daily. 90 tablet 3   acetaminophen (TYLENOL) 160 MG/5ML liquid Take 500 mg by mouth every 4 (four) hours as needed for fever.     calcium citrate-vitamin D (CITRACAL+D) 315-200 MG-UNIT tablet Take 1 tablet by mouth daily.     LORazepam (ATIVAN) 0.5 MG tablet Take 1 tablet (0.5 mg total) by mouth every 8 (eight) hours. Take 1  tablet (0.$RemoveBe'5mg'emcPJohNu$ ) by mouth 1 hour prior to PEG tube placement.  May repeat X 1 if needed. 30 tablet 0   nitrofurantoin, macrocrystal-monohydrate, (MACROBID) 100 MG capsule Take 1 capsule (100 mg total) by mouth 2 (two) times daily. 10 capsule 0   Nutritional Supplements (FEEDING SUPPLEMENT, OSMOLITE 1.5 CAL,) LIQD Place 120 mLs into feeding tube 5 (five) times daily. Advance to 281ml 5 times a day if tolerated 1000 mL 20   Tiotropium Bromide-Olodaterol (STIOLTO RESPIMAT) 2.5-2.5 MCG/ACT AERS Inhale 2 puffs into the lungs daily. 4 g 2   VENTOLIN HFA 108 (90 Base) MCG/ACT inhaler TAKE 2 PUFFS BY MOUTH EVERY 6 HOURS AS NEEDED FOR WHEEZE OR SHORTNESS OF BREATH 18 each 1   No current facility-administered medications for this visit.    PHYSICAL EXAMINATION: ECOG PERFORMANCE STATUS: 1 - Symptomatic but completely ambulatory  Vitals:   10/24/21 1129  BP: 113/60  Pulse: 88  Resp: 17  Temp: (!) 97.3 F (36.3 C)  SpO2: 99%   Filed Weights   10/24/21 1129  Weight: 138 lb 9.6 oz (62.9 kg)      LABORATORY DATA:  I have reviewed the data as listed    Latest Ref Rng & Units 10/14/2021    3:07 PM 09/23/2021   12:26 PM 07/15/2021    4:16 PM  CMP  Glucose 70 - 99 mg/dL 104  109  98   BUN 8 - 23 mg/dL $Remove'31  29  28   'nuEERvs$ Creatinine 0.44 - 1.00 mg/dL 0.83  0.82  0.83   Sodium 135 -  145 mmol/L 138  137  137   Potassium 3.5 - 5.1 mmol/L 4.4  5.0  4.6   Chloride 98 - 111 mmol/L 104  102  103   CO2 22 - 32 mmol/L $RemoveB'29  31  28   'LhTKkDMh$ Calcium 8.9 - 10.3 mg/dL 9.5  9.7  9.5   Total Protein 6.5 - 8.1 g/dL 6.8  6.9  6.9   Total Bilirubin 0.3 - 1.2 mg/dL 0.5  0.6  0.6   Alkaline Phos 38 - 126 U/L 63  59  64   AST 15 - 41 U/L $Remo'20  23  23   'mVDGn$ ALT 0 - 44 U/L $Remo'13  18  21     'oKTWx$ Lab Results  Component Value Date   WBC 5.8 10/14/2021   HGB 14.5 10/14/2021   HCT 43.5 10/14/2021   MCV 96.5 10/14/2021   PLT 196 10/14/2021   NEUTROABS 4.2 10/14/2021    ASSESSMENT & PLAN:  Primary malignant neoplasm of breast with  metastasis ([place]) Originally diagnosed in 1995, patient did not take prescribed tamoxifen. Now presenting with metastatic disease based on scans done 03/11/2020 CT CAP: Numerous lytic lesions in the thoracic spine several with expansion extraosseous extension. 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  Uncertain if any radiation was given to the skull lesions.   Current Treatment:   letrozole. Started December 2021 Letrozole toxicities: Denies any side effects to letrozole Because of slight progression in the subpectoral lymph node, we are switching from letrozole to anastrozole   Poor nutritional status: Since the tube feedings have started her weight is improving.  Energy levels are improving and she is got a better positive outlook.    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  No evidence of visceral metastatic disease. CT CAP 04/15/2021: No substantial interval change similar bone metastases CT CAP: 10/16/21: Enlarging Rt Subpectoral/ Axill LN, stable bone mets.  holding off on adding CDK inhibitor therapy at this time. Because of the increase in the lymph node, recommend switching from letrozole to anastrozole.  Recheck with a PET CT scan in 2 months and follow-up after that.    Orders Placed This Encounter  Procedures   NM PET Image Restag (PS) Skull Base To Thigh    Standing Status:   Future    Standing Expiration Date:   10/24/2022    Order Specific Question:   If indicated for the ordered procedure, I authorize the administration of a radiopharmaceutical per Radiology protocol    Answer:   Yes    Order Specific Question:   Preferred imaging location?    Answer:   [facility]   The patient has a good understanding of the overall plan. she agrees with it. she will call  with any problems that may develop before the next visit here. Total time spent: 30 mins including face to face time and time spent for planning, charting and co-ordination of care   Harriette Ohara, MD 10/24/21    I Gardiner Coins am scribing for Dr. Lindi Adie  I have reviewed the above documentation for accuracy and completeness, and I agree with the above.

## 2021-10-13 ENCOUNTER — Other Ambulatory Visit: Payer: Self-pay | Admitting: *Deleted

## 2021-10-13 DIAGNOSIS — Z170 Estrogen receptor positive status [ER+]: Secondary | ICD-10-CM

## 2021-10-14 ENCOUNTER — Inpatient Hospital Stay: Payer: Medicare Other | Attending: Hematology and Oncology

## 2021-10-14 ENCOUNTER — Other Ambulatory Visit: Payer: Self-pay

## 2021-10-14 ENCOUNTER — Other Ambulatory Visit: Payer: Medicare Other

## 2021-10-14 DIAGNOSIS — Z79811 Long term (current) use of aromatase inhibitors: Secondary | ICD-10-CM | POA: Diagnosis not present

## 2021-10-14 DIAGNOSIS — C7951 Secondary malignant neoplasm of bone: Secondary | ICD-10-CM | POA: Diagnosis present

## 2021-10-14 DIAGNOSIS — Z170 Estrogen receptor positive status [ER+]: Secondary | ICD-10-CM | POA: Diagnosis not present

## 2021-10-14 DIAGNOSIS — C50911 Malignant neoplasm of unspecified site of right female breast: Secondary | ICD-10-CM | POA: Diagnosis not present

## 2021-10-14 DIAGNOSIS — Z7983 Long term (current) use of bisphosphonates: Secondary | ICD-10-CM | POA: Diagnosis not present

## 2021-10-14 LAB — CBC WITH DIFFERENTIAL (CANCER CENTER ONLY)
Abs Immature Granulocytes: 0.01 10*3/uL (ref 0.00–0.07)
Basophils Absolute: 0 10*3/uL (ref 0.0–0.1)
Basophils Relative: 1 %
Eosinophils Absolute: 0.2 10*3/uL (ref 0.0–0.5)
Eosinophils Relative: 3 %
HCT: 43.5 % (ref 36.0–46.0)
Hemoglobin: 14.5 g/dL (ref 12.0–15.0)
Immature Granulocytes: 0 %
Lymphocytes Relative: 11 %
Lymphs Abs: 0.7 10*3/uL (ref 0.7–4.0)
MCH: 32.2 pg (ref 26.0–34.0)
MCHC: 33.3 g/dL (ref 30.0–36.0)
MCV: 96.5 fL (ref 80.0–100.0)
Monocytes Absolute: 0.7 10*3/uL (ref 0.1–1.0)
Monocytes Relative: 12 %
Neutro Abs: 4.2 10*3/uL (ref 1.7–7.7)
Neutrophils Relative %: 73 %
Platelet Count: 196 10*3/uL (ref 150–400)
RBC: 4.51 MIL/uL (ref 3.87–5.11)
RDW: 13.5 % (ref 11.5–15.5)
WBC Count: 5.8 10*3/uL (ref 4.0–10.5)
nRBC: 0 % (ref 0.0–0.2)

## 2021-10-14 LAB — CMP (CANCER CENTER ONLY)
ALT: 13 U/L (ref 0–44)
AST: 20 U/L (ref 15–41)
Albumin: 4.1 g/dL (ref 3.5–5.0)
Alkaline Phosphatase: 63 U/L (ref 38–126)
Anion gap: 5 (ref 5–15)
BUN: 31 mg/dL — ABNORMAL HIGH (ref 8–23)
CO2: 29 mmol/L (ref 22–32)
Calcium: 9.5 mg/dL (ref 8.9–10.3)
Chloride: 104 mmol/L (ref 98–111)
Creatinine: 0.83 mg/dL (ref 0.44–1.00)
GFR, Estimated: >60 mL/min (ref >60)
Glucose, Bld: 104 mg/dL — ABNORMAL HIGH (ref 70–99)
Potassium: 4.4 mmol/L (ref 3.5–5.1)
Sodium: 138 mmol/L (ref 135–145)
Total Bilirubin: 0.5 mg/dL (ref 0.3–1.2)
Total Protein: 6.8 g/dL (ref 6.5–8.1)

## 2021-10-15 ENCOUNTER — Ambulatory Visit (HOSPITAL_COMMUNITY)
Admission: RE | Admit: 2021-10-15 | Discharge: 2021-10-15 | Disposition: A | Discharge status: Home / Self Care | Payer: Medicare Other | Source: Ambulatory Visit | Attending: Hematology and Oncology | Admitting: Hematology and Oncology

## 2021-10-15 ENCOUNTER — Other Ambulatory Visit: Payer: Medicare Other

## 2021-10-15 ENCOUNTER — Encounter (HOSPITAL_COMMUNITY): Payer: Self-pay

## 2021-10-15 DIAGNOSIS — C50919 Malignant neoplasm of unspecified site of unspecified female breast: Secondary | ICD-10-CM | POA: Diagnosis present

## 2021-10-15 DIAGNOSIS — Z170 Estrogen receptor positive status [ER+]: Secondary | ICD-10-CM | POA: Insufficient documentation

## 2021-10-15 DIAGNOSIS — C50412 Malignant neoplasm of upper-outer quadrant of left female breast: Secondary | ICD-10-CM | POA: Diagnosis present

## 2021-10-15 MED ORDER — IOHEXOL 300 MG/ML  SOLN
100.0000 mL | Freq: Once | INTRAMUSCULAR | Status: AC | PRN
  Administered 2021-10-15: 100 mL via INTRAVENOUS

## 2021-10-15 MED ORDER — SODIUM CHLORIDE (PF) 0.9 % IJ SOLN
INTRAMUSCULAR | Status: AC
  Filled 2021-10-15: qty 50

## 2021-10-17 ENCOUNTER — Ambulatory Visit: Payer: Medicare Other | Admitting: Hematology and Oncology

## 2021-10-17 ENCOUNTER — Ambulatory Visit: Payer: Medicare Other

## 2021-10-24 ENCOUNTER — Other Ambulatory Visit: Payer: Self-pay

## 2021-10-24 ENCOUNTER — Inpatient Hospital Stay (HOSPITAL_BASED_OUTPATIENT_CLINIC_OR_DEPARTMENT_OTHER): Payer: Medicare Other | Admitting: Hematology and Oncology

## 2021-10-24 ENCOUNTER — Inpatient Hospital Stay: Payer: Medicare Other

## 2021-10-24 VITALS — BP 113/60 | HR 88 | Temp 97.3°F | Resp 17 | Ht 66.0 in | Wt 138.6 lb

## 2021-10-24 DIAGNOSIS — C7951 Secondary malignant neoplasm of bone: Secondary | ICD-10-CM

## 2021-10-24 DIAGNOSIS — C50911 Malignant neoplasm of unspecified site of right female breast: Secondary | ICD-10-CM | POA: Diagnosis not present

## 2021-10-24 DIAGNOSIS — C50919 Malignant neoplasm of unspecified site of unspecified female breast: Secondary | ICD-10-CM

## 2021-10-24 MED ORDER — LORAZEPAM 0.5 MG PO TABS: 0.5000 mg | ORAL_TABLET | Freq: Three times a day (TID) | ORAL | 0 refills | Status: DC

## 2021-10-24 MED ORDER — ANASTROZOLE 1 MG PO TABS: 1.0000 mg | ORAL_TABLET | Freq: Every day | ORAL | 3 refills | Status: DC

## 2021-10-24 MED ORDER — DENOSUMAB 120 MG/1.7ML ~~LOC~~ SOLN
120.0000 mg | Freq: Once | SUBCUTANEOUS | Status: AC
  Administered 2021-10-24: 120 mg via SUBCUTANEOUS
  Filled 2021-10-24: qty 1.7

## 2021-10-24 NOTE — Assessment & Plan Note (Addendum)
Originally diagnosed in 1995, patient did not take prescribed tamoxifen. Now presenting with metastatic disease based on scans done 03/11/2020 CT CAP: Numerous lytic lesions in the thoracic spine several with expansion extraosseous extension. 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 Uncertain if any radiation was given to the skull lesions.  CurrentTreatment: letrozole. StartedDecember 2021 Letrozoletoxicities:Denies any side effects to letrozole  Poor nutritional status: Since the tube feedings have started her weight is improving. Energy levels are improving and she is got a better positive outlook.   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 No evidence of visceral metastatic disease. CT CAP 04/15/2021: No substantial interval change similar bone metastases CT CAP: 10/16/21: Enlarging Rt Subpectoral/ Axill LN, stable bone mets.  holding off on adding CDK inhibitor therapy at this time. We will recheck the subpectoral LN with another CT chest in 3 months

## 2021-10-24 NOTE — Patient Instructions (Signed)
Denosumab injection What is this medication? DENOSUMAB (den oh sue mab) slows bone breakdown. Prolia is used to treat osteoporosis in women after menopause and in men, and in people who are taking corticosteroids for 6 months or more. Xgeva is used to treat a high calcium level due to cancer and to prevent bone fractures and other bone problems caused by multiple myeloma or cancer bone metastases. Xgeva is also used to treat giant cell tumor of the bone. This medicine may be used for other purposes; ask your health care provider or pharmacist if you have questions. COMMON BRAND NAME(S): Prolia, XGEVA What should I tell my care team before I take this medication? They need to know if you have any of these conditions: dental disease having surgery or tooth extraction infection kidney disease low levels of calcium or Vitamin D in the blood malnutrition on hemodialysis skin conditions or sensitivity thyroid or parathyroid disease an unusual reaction to denosumab, other medicines, foods, dyes, or preservatives pregnant or trying to get pregnant breast-feeding How should I use this medication? This medicine is for injection under the skin. It is given by a health care professional in a hospital or clinic setting. A special MedGuide will be given to you before each treatment. Be sure to read this information carefully each time. For Prolia, talk to your pediatrician regarding the use of this medicine in children. Special care may be needed. For Xgeva, talk to your pediatrician regarding the use of this medicine in children. While this drug may be prescribed for children as young as 13 years for selected conditions, precautions do apply. Overdosage: If you think you have taken too much of this medicine contact a poison control center or emergency room at once. NOTE: This medicine is only for you. Do not share this medicine with others. What if I miss a dose? It is important not to miss your dose.  Call your doctor or health care professional if you are unable to keep an appointment. What may interact with this medication? Do not take this medicine with any of the following medications: other medicines containing denosumab This medicine may also interact with the following medications: medicines that lower your chance of fighting infection steroid medicines like prednisone or cortisone This list may not describe all possible interactions. Give your health care provider a list of all the medicines, herbs, non-prescription drugs, or dietary supplements you use. Also tell them if you smoke, drink alcohol, or use illegal drugs. Some items may interact with your medicine. What should I watch for while using this medication? Visit your doctor or health care professional for regular checks on your progress. Your doctor or health care professional may order blood tests and other tests to see how you are doing. Call your doctor or health care professional for advice if you get a fever, chills or sore throat, or other symptoms of a cold or flu. Do not treat yourself. This drug may decrease your body's ability to fight infection. Try to avoid being around people who are sick. You should make sure you get enough calcium and vitamin D while you are taking this medicine, unless your doctor tells you not to. Discuss the foods you eat and the vitamins you take with your health care professional. See your dentist regularly. Brush and floss your teeth as directed. Before you have any dental work done, tell your dentist you are receiving this medicine. Do not become pregnant while taking this medicine or for 5 months after   stopping it. Talk with your doctor or health care professional about your birth control options while taking this medicine. Women should inform their doctor if they wish to become pregnant or think they might be pregnant. There is a potential for serious side effects to an unborn child. Talk to  your health care professional or pharmacist for more information. What side effects may I notice from receiving this medication? Side effects that you should report to your doctor or health care professional as soon as possible: allergic reactions like skin rash, itching or hives, swelling of the face, lips, or tongue bone pain breathing problems dizziness jaw pain, especially after dental work redness, blistering, peeling of the skin signs and symptoms of infection like fever or chills; cough; sore throat; pain or trouble passing urine signs of low calcium like fast heartbeat, muscle cramps or muscle pain; pain, tingling, numbness in the hands or feet; seizures unusual bleeding or bruising unusually weak or tired Side effects that usually do not require medical attention (report to your doctor or health care professional if they continue or are bothersome): constipation diarrhea headache joint pain loss of appetite muscle pain runny nose tiredness upset stomach This list may not describe all possible side effects. Call your doctor for medical advice about side effects. You may report side effects to FDA at 1-800-FDA-1088. Where should I keep my medication? This medicine is only given in a clinic, doctor's office, or other health care setting and will not be stored at home. NOTE: This sheet is a summary. It may not cover all possible information. If you have questions about this medicine, talk to your doctor, pharmacist, or health care provider.  2023 Elsevier/Gold Standard (2017-08-20 00:00:00)  

## 2021-11-18 ENCOUNTER — Telehealth: Payer: Self-pay | Admitting: Hematology and Oncology

## 2021-11-18 NOTE — Telephone Encounter (Signed)
.  Called patient to schedule appointment per 7/24 inbasket, patient is aware of date and time.   

## 2021-12-05 ENCOUNTER — Other Ambulatory Visit: Payer: Self-pay | Admitting: Physician Assistant

## 2021-12-05 DIAGNOSIS — R058 Other specified cough: Secondary | ICD-10-CM

## 2021-12-17 ENCOUNTER — Encounter: Payer: Self-pay | Admitting: General Practice

## 2022-01-01 ENCOUNTER — Other Ambulatory Visit: Payer: Self-pay | Admitting: *Deleted

## 2022-01-01 MED ORDER — LORAZEPAM 0.5 MG PO TABS: 0.5000 mg | ORAL_TABLET | Freq: Three times a day (TID) | ORAL | 0 refills | Status: DC

## 2022-01-05 ENCOUNTER — Ambulatory Visit: Payer: Medicare Other | Admitting: Hematology and Oncology

## 2022-01-07 NOTE — Progress Notes (Signed)
Patient Care Team: Hali Marry, MD as PCP - General (Family Medicine) Nicholas Lose, MD as Consulting Physician (Hematology and Oncology)  DIAGNOSIS:  Encounter Diagnoses  Name Primary?   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[facility]) Yes   Primary malignant neoplasm of breast with metastasis ([place])     SUMMARY OF ONCOLOGIC HISTORY: Oncology History  Primary malignant neoplasm of breast with metastasis ([place])  1995 Initial Biopsy   Right breast cancer stage IIb ER positive IDC s/p mastectomy and 2 months of tamoxifen, lost to follow-up (treatment at [facility])   03/12/2020 - 03/26/2020 Radiation Therapy   Palliative radiation to the brain and spine for brain metastases   03/14/2020 Initial Diagnosis   Scans on 03/14/2020: Multiple bone metastases and brain metastases, biopsy of the rib: Breast cancer ER 80% PR negative HER-2 negative.  Moved to [facility] December 2021 to be closer to her family     CHIEF COMPLIANT: Follow-up metastatic breast cancer currently on anastrozole.    INTERVAL HISTORY: Jodi Kaufman is a 81 y.o. with above-mentioned history of metastatic breast cancer currently on . She presents to the clinic today for a follow-up. She is having a reaction to the anastrozole. Complains of dizziness and diarrhea. She  states after she takes the anastrozole she has to lay down immediately. She reports that she has been dehydrated.  She has problems with ambulation and falls frequently.  She is getting around with the help of a cane.   ALLERGIES:  is allergic to atorvastatin, bactrim [sulfamethoxazole-trimethoprim], clavulanic acid, guaifenesin & derivatives, and tamoxifen.  MEDICATIONS:  Current Outpatient Medications  Medication Sig Dispense Refill   acetaminophen (TYLENOL) 160 MG/5ML liquid Take 500 mg by mouth every 4 (four) hours as needed for fever.     anastrozole (ARIMIDEX) 1 MG tablet Take 1 tablet (1 mg total) by mouth daily. 90 tablet 3    calcium citrate-vitamin D (CITRACAL+D) 315-200 MG-UNIT tablet Take 1 tablet by mouth daily. (Patient not taking: Reported on 01/12/2022)     VENTOLIN HFA 108 (90 Base) MCG/ACT inhaler TAKE 2 PUFFS BY MOUTH EVERY 6 HOURS AS NEEDED FOR WHEEZE OR SHORTNESS OF BREATH (Patient not taking: Reported on 01/12/2022) 18 each 1   No current facility-administered medications for this visit.   Facility-Administered Medications Ordered in Other Visits  Medication Dose Route Frequency Provider Last Rate Last Admin   fludeoxyglucose F - 18 (FDG) injection 7 millicurie  7 millicurie Intravenous Once PRN Felipa Emory, MD        PHYSICAL EXAMINATION: ECOG PERFORMANCE STATUS: 1 - Symptomatic but completely ambulatory  Vitals:   01/12/22 1438  BP: 114/69  Pulse: 93  Resp: 18  Temp: 97.8 F (36.6 C)  SpO2: 98%   Filed Weights   01/12/22 1438  Weight: 140 lb 12.8 oz (63.9 kg)      LABORATORY DATA:  I have reviewed the data as listed    Latest Ref Rng & Units 01/12/2022    2:10 PM 10/14/2021    3:07 PM 09/23/2021   12:26 PM  CMP  Glucose 70 - 99 mg/dL 110  104  109   BUN 8 - 23 mg/dL $Remove'25  31  29   'ySsBvsN$ Creatinine 0.44 - 1.00 mg/dL 0.77  0.83  0.82   Sodium 135 - 145 mmol/L 137  138  137   Potassium 3.5 - 5.1 mmol/L 4.6  4.4  5.0   Chloride 98 - 111 mmol/L 105  104  102  CO2 22 - 32 mmol/L $RemoveB'23  29  31   'plueBdfY$ Calcium 8.9 - 10.3 mg/dL 9.3  9.5  9.7   Total Protein 6.5 - 8.1 g/dL 7.0  6.8  6.9   Total Bilirubin 0.3 - 1.2 mg/dL 0.7  0.5  0.6   Alkaline Phos 38 - 126 U/L 64  63  59   AST 15 - 41 U/L $Remo'23  20  23   'gYGFx$ ALT 0 - 44 U/L $Remo'20  13  18     'BRUNT$ Lab Results  Component Value Date   WBC 5.7 01/12/2022   HGB 15.1 (H) 01/12/2022   HCT 45.1 01/12/2022   MCV 95.6 01/12/2022   PLT 200 01/12/2022   NEUTROABS 4.2 01/12/2022    ASSESSMENT & PLAN:  Primary malignant neoplasm of breast with metastasis ([place]) Originally diagnosed in 1995, patient did not take prescribed tamoxifen. Now presenting with metastatic  disease based on scans done 03/11/2020 CT CAP: Numerous lytic lesions in the thoracic spine several with expansion extraosseous extension. 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  Uncertain if any radiation was given to the skull lesions.   Current Treatment:   letrozole. Started December 2021 Letrozole toxicities: Denies any side effects to letrozole   Poor nutritional status: Since the tube feedings have started her weight is improving.  Energy levels are improving and she is got a better positive outlook.    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  No evidence of visceral metastatic disease. CT CAP 04/15/2021: No substantial interval change similar bone metastases CT CAP: 10/16/21: Enlarging Rt Subpectoral/ Axill LN, stable bone mets. PET CT scan 05/07/7354: Hypermetabolic right subpectoral/axillary lymph node, stable bone metastases  Return to clinic in 3 months for lab injection and follow-up.  We will perform another CT scan in 3 months.  If that also remains stable then we will go to every 4- 6 months.    Orders Placed This Encounter  Procedures   CT CHEST ABDOMEN PELVIS W CONTRAST    Standing Status:   Future    Standing Expiration Date:   01/13/2023    Order Specific Question:   Preferred imaging location?    Answer:   [facility]    Order Specific Question:   Is Oral Contrast requested for this exam?    Answer:   Yes, Per Radiology protocol   The patient has a good understanding of the overall plan. she agrees with it. she will call with any problems that may develop before the next visit here. Total time spent: 30 mins including face to face time and time spent for planning, charting and co-ordination of care   Harriette Ohara, MD 01/12/22    I  Gardiner Coins am scribing for Dr. Lindi Adie  I have reviewed the above documentation for accuracy and completeness, and I agree with the above.

## 2022-01-08 ENCOUNTER — Ambulatory Visit (HOSPITAL_COMMUNITY)
Admission: RE | Admit: 2022-01-08 | Discharge: 2022-01-08 | Disposition: A | Discharge status: Home / Self Care | Payer: Medicare Other | Source: Ambulatory Visit | Attending: Hematology and Oncology | Admitting: Hematology and Oncology

## 2022-01-08 DIAGNOSIS — C50919 Malignant neoplasm of unspecified site of unspecified female breast: Secondary | ICD-10-CM | POA: Insufficient documentation

## 2022-01-08 DIAGNOSIS — C773 Secondary and unspecified malignant neoplasm of axilla and upper limb lymph nodes: Secondary | ICD-10-CM | POA: Diagnosis not present

## 2022-01-08 DIAGNOSIS — C7951 Secondary malignant neoplasm of bone: Secondary | ICD-10-CM | POA: Diagnosis not present

## 2022-01-08 LAB — GLUCOSE, CAPILLARY: Glucose-Capillary: 117 mg/dL — ABNORMAL HIGH (ref 70–99)

## 2022-01-08 IMAGING — CT CT HEAD W/O CM
3 series · 15 of 47 positions shown, 18 images · non-contrast
Comparison: None.

CLINICAL DATA: Metastatic disease evaluation.

EXAM:
CT HEAD WITHOUT CONTRAST
TECHNIQUE: Contiguous axial images were obtained from the base of the skull
through the vertex without intravenous contrast.

[Series 2: head wo · axial · 0.44mm/px · z∈[-172,-42]mm · 9 of 32 slices shown, 12 images]
[im 3/32  brain]
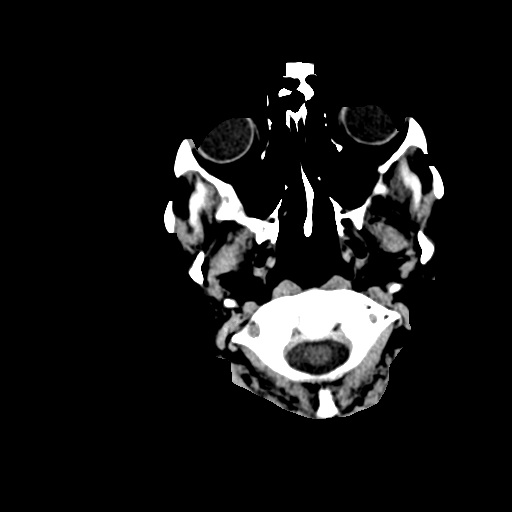
[im 3/32  bone]
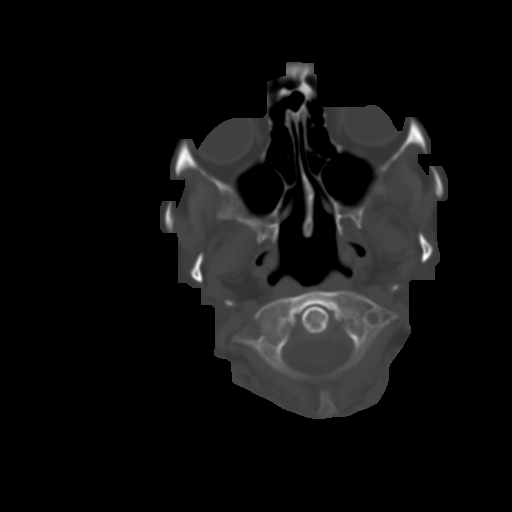
[im 6/32  brain]
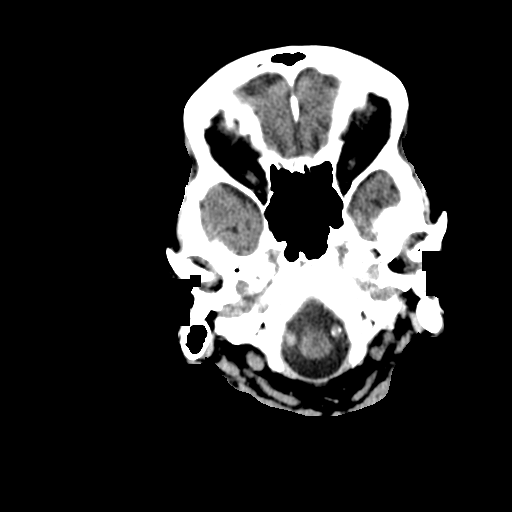
[im 9/32  brain]
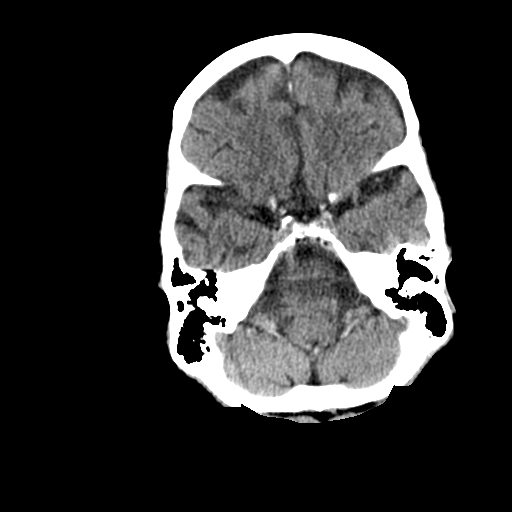
[im 12/32  brain]
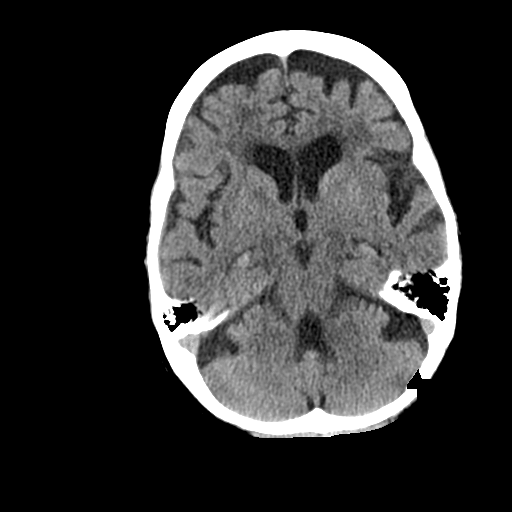
[im 17/32  brain]
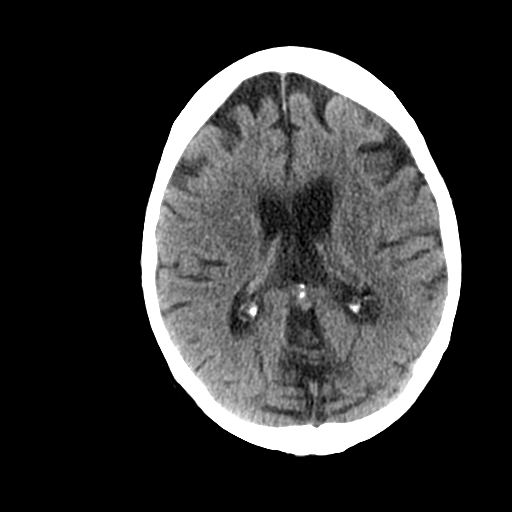
[im 17/32  bone]
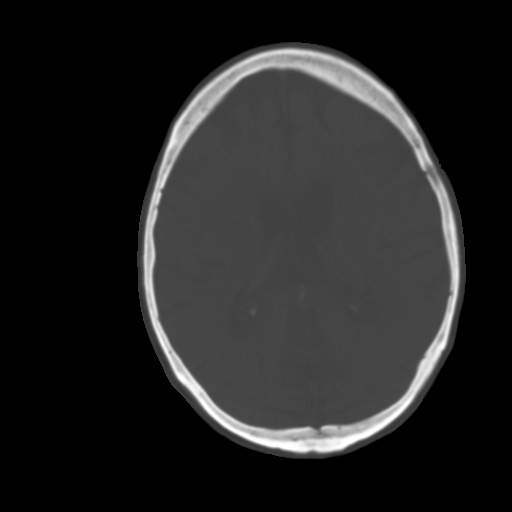
[im 20/32  brain]
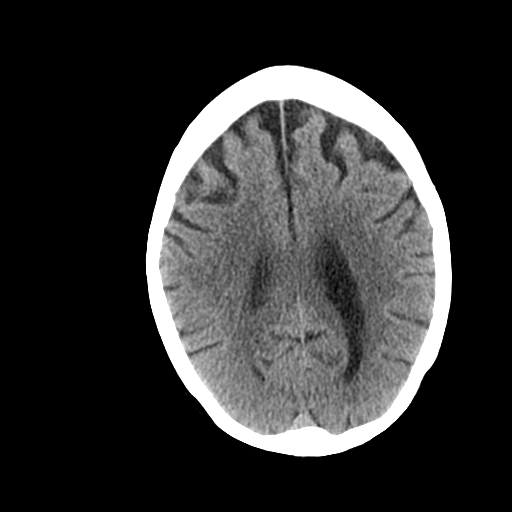
[im 23/32  brain]
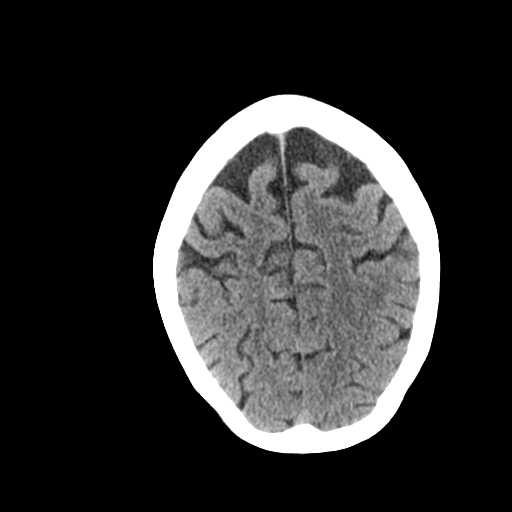
[im 26/32  brain]
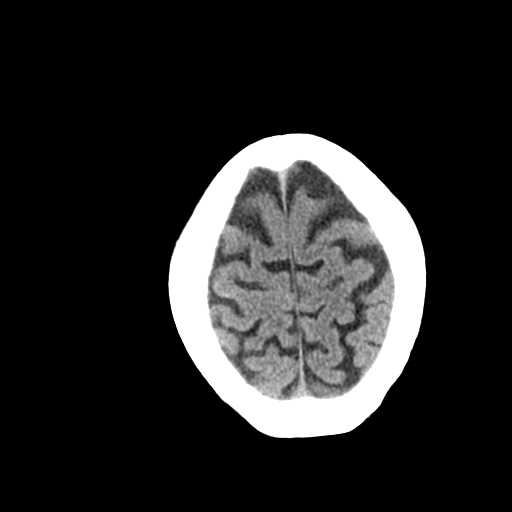
[im 29/32  brain]
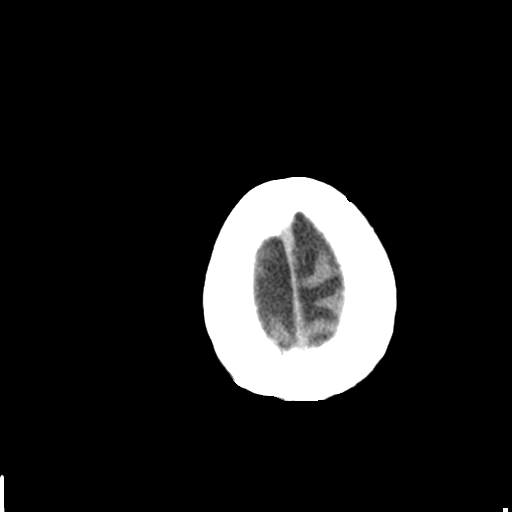
[im 29/32  bone]
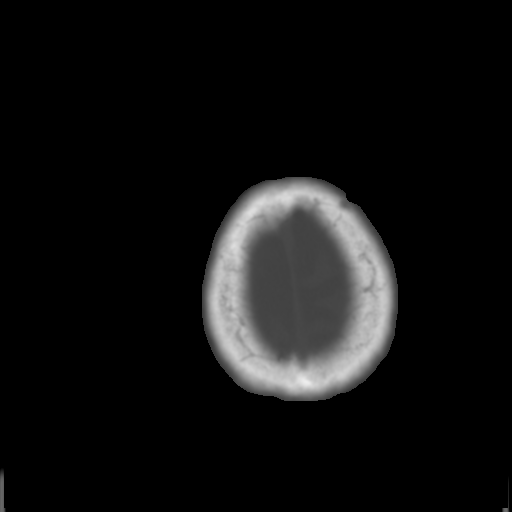

[Series 4: coronal soft tissue · coronal · 0.30mm/px · 3 of 73 slices shown]
[im 25/73  brain]
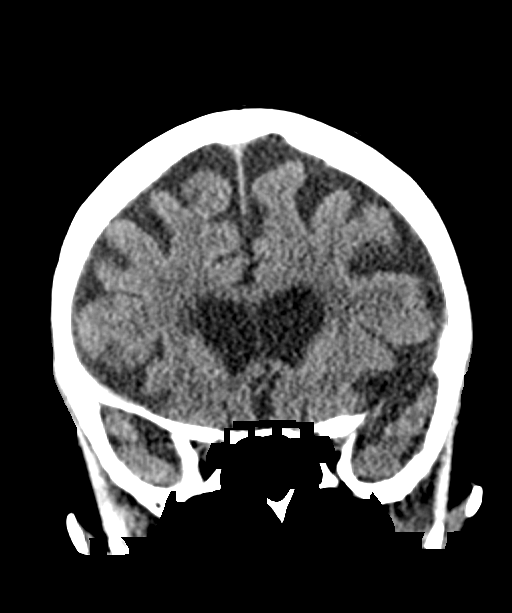
[im 33/73  brain]
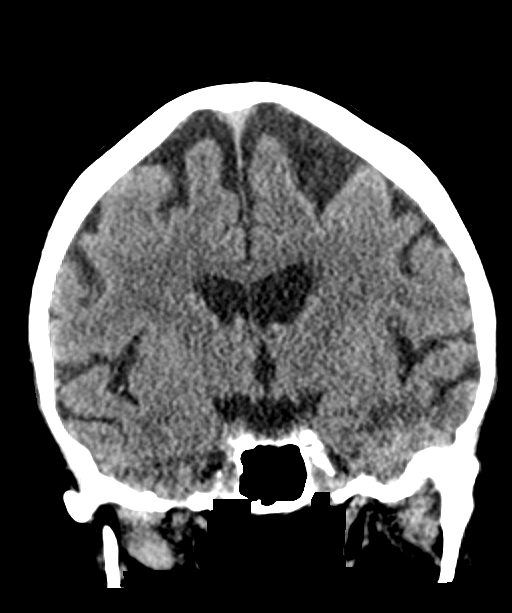
[im 41/73  brain]
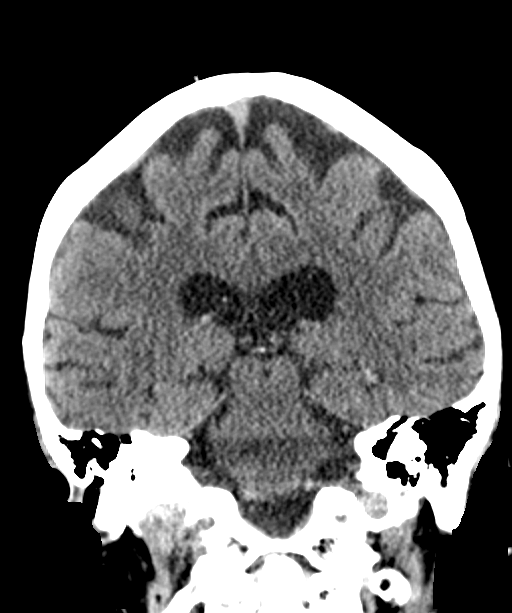

[Series 5: sagittal soft tissue · sagittal · 0.34mm/px · 3 of 50 slices shown]
[im 17/50  brain]
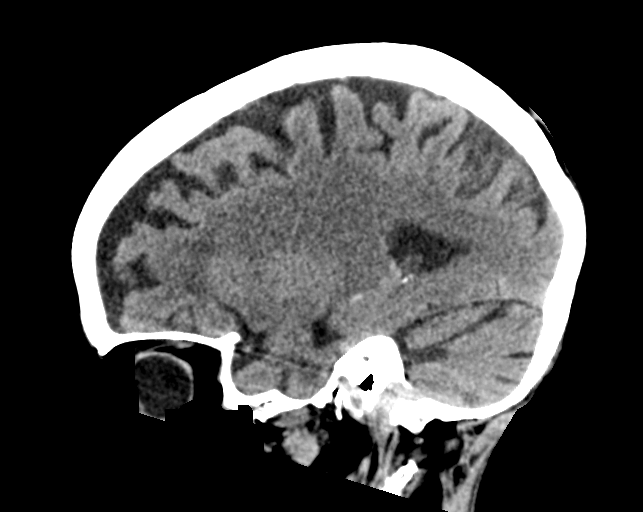
[im 25/50  brain]
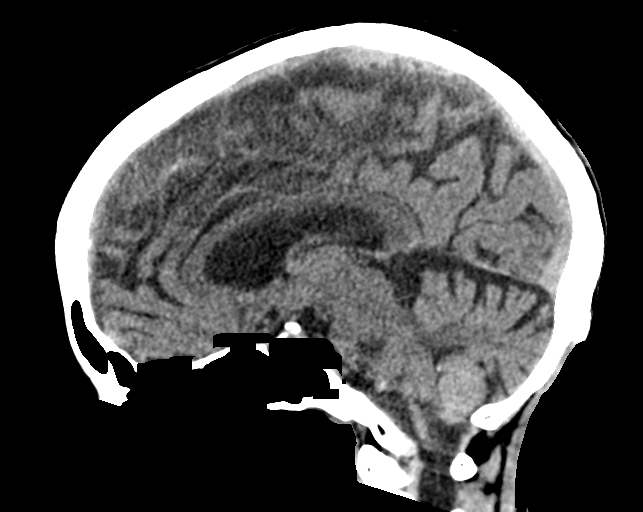
[im 33/50  brain]
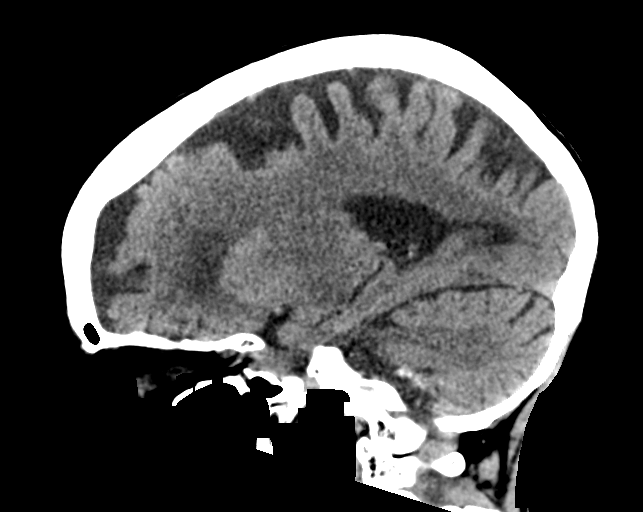

[15 of 47 positions shown; findings below may reference images not displayed]

FINDINGS: Brain: No evidence of acute infarction, hemorrhage, hydrocephalus,
extra-axial collection or mass lesion/mass effect. Mild scattered
white matter hypoattenuation, which is nonspecific but most likely
related to chronic microvascular ischemic disease. No midline shift.

Vascular: Calcific atherosclerosis.

Skull: Left occipital lytic calvarial lesion (series 3, image 12).
Left frontal calvarial lytic lesion (series 3, image 16).

Sinuses/Orbits: Visualized sinuses are clear.  Unremarkable orbits.

Other: No mastoid effusions.
IMPRESSION: 1. No evidence of acute intracranial abnormality. No obvious
evidence of intracranial metastatic disease, although evaluation is
limited on this noncontrast head CT. MRI with and without contrast
could provide far more sensitive evaluation if clinically indicated.
2. Lytic calvarial lesions involving the left occipital and left
frontal bones, concerning for osseous metastatic disease given the
clinical history. Outside MRI (from 03/14/2020 in [REDACTED])
describes additional areas of metastatic disease at the
craniocervical junction, which are not well evaluated on this study.

## 2022-01-08 MED ORDER — FLUDEOXYGLUCOSE F - 18 (FDG) INJECTION
7.0000 | Freq: Once | INTRAVENOUS | Status: DC | PRN
Start: 2022-01-08 — End: 2022-01-14

## 2022-01-12 ENCOUNTER — Inpatient Hospital Stay: Payer: Medicare Other

## 2022-01-12 ENCOUNTER — Other Ambulatory Visit: Payer: Self-pay

## 2022-01-12 ENCOUNTER — Inpatient Hospital Stay: Payer: Medicare Other | Attending: Hematology and Oncology | Admitting: Hematology and Oncology

## 2022-01-12 VITALS — BP 114/69 | HR 93 | Temp 97.8°F | Resp 18 | Ht 66.0 in | Wt 140.8 lb

## 2022-01-12 DIAGNOSIS — C7951 Secondary malignant neoplasm of bone: Secondary | ICD-10-CM | POA: Diagnosis present

## 2022-01-12 DIAGNOSIS — C50412 Malignant neoplasm of upper-outer quadrant of left female breast: Secondary | ICD-10-CM

## 2022-01-12 DIAGNOSIS — Z931 Gastrostomy status: Secondary | ICD-10-CM | POA: Diagnosis not present

## 2022-01-12 DIAGNOSIS — E639 Nutritional deficiency, unspecified: Secondary | ICD-10-CM | POA: Diagnosis not present

## 2022-01-12 DIAGNOSIS — Z170 Estrogen receptor positive status [ER+]: Secondary | ICD-10-CM | POA: Insufficient documentation

## 2022-01-12 DIAGNOSIS — Z923 Personal history of irradiation: Secondary | ICD-10-CM | POA: Insufficient documentation

## 2022-01-12 DIAGNOSIS — C50911 Malignant neoplasm of unspecified site of right female breast: Secondary | ICD-10-CM | POA: Diagnosis present

## 2022-01-12 DIAGNOSIS — Z79811 Long term (current) use of aromatase inhibitors: Secondary | ICD-10-CM | POA: Insufficient documentation

## 2022-01-12 DIAGNOSIS — C50919 Malignant neoplasm of unspecified site of unspecified female breast: Secondary | ICD-10-CM | POA: Diagnosis not present

## 2022-01-12 LAB — CBC WITH DIFFERENTIAL (CANCER CENTER ONLY)
Abs Immature Granulocytes: 0.01 10*3/uL (ref 0.00–0.07)
Basophils Absolute: 0 10*3/uL (ref 0.0–0.1)
Basophils Relative: 1 %
Eosinophils Absolute: 0.2 10*3/uL (ref 0.0–0.5)
Eosinophils Relative: 3 %
HCT: 45.1 % (ref 36.0–46.0)
Hemoglobin: 15.1 g/dL — ABNORMAL HIGH (ref 12.0–15.0)
Immature Granulocytes: 0 %
Lymphocytes Relative: 12 %
Lymphs Abs: 0.7 10*3/uL (ref 0.7–4.0)
MCH: 32 pg (ref 26.0–34.0)
MCHC: 33.5 g/dL (ref 30.0–36.0)
MCV: 95.6 fL (ref 80.0–100.0)
Monocytes Absolute: 0.6 10*3/uL (ref 0.1–1.0)
Monocytes Relative: 11 %
Neutro Abs: 4.2 10*3/uL (ref 1.7–7.7)
Neutrophils Relative %: 73 %
Platelet Count: 200 10*3/uL (ref 150–400)
RBC: 4.72 MIL/uL (ref 3.87–5.11)
RDW: 13 % (ref 11.5–15.5)
WBC Count: 5.7 10*3/uL (ref 4.0–10.5)
nRBC: 0 % (ref 0.0–0.2)

## 2022-01-12 LAB — CMP (CANCER CENTER ONLY)
ALT: 20 U/L (ref 0–44)
AST: 23 U/L (ref 15–41)
Albumin: 4.2 g/dL (ref 3.5–5.0)
Alkaline Phosphatase: 64 U/L (ref 38–126)
Anion gap: 9 (ref 5–15)
BUN: 25 mg/dL — ABNORMAL HIGH (ref 8–23)
CO2: 23 mmol/L (ref 22–32)
Calcium: 9.3 mg/dL (ref 8.9–10.3)
Chloride: 105 mmol/L (ref 98–111)
Creatinine: 0.77 mg/dL (ref 0.44–1.00)
GFR, Estimated: >60 mL/min (ref >60)
Glucose, Bld: 110 mg/dL — ABNORMAL HIGH (ref 70–99)
Potassium: 4.6 mmol/L (ref 3.5–5.1)
Sodium: 137 mmol/L (ref 135–145)
Total Bilirubin: 0.7 mg/dL (ref 0.3–1.2)
Total Protein: 7 g/dL (ref 6.5–8.1)

## 2022-01-12 MED ORDER — DENOSUMAB 120 MG/1.7ML ~~LOC~~ SOLN
120.0000 mg | Freq: Once | SUBCUTANEOUS | Status: AC
  Administered 2022-01-12: 120 mg via SUBCUTANEOUS
  Filled 2022-01-12: qty 1.7

## 2022-01-12 NOTE — Patient Instructions (Signed)
Denosumab injection What is this medication? DENOSUMAB (den oh sue mab) slows bone breakdown. Prolia is used to treat osteoporosis in women after menopause and in men, and in people who are taking corticosteroids for 6 months or more. Xgeva is used to treat a high calcium level due to cancer and to prevent bone fractures and other bone problems caused by multiple myeloma or cancer bone metastases. Xgeva is also used to treat giant cell tumor of the bone. This medicine may be used for other purposes; ask your health care provider or pharmacist if you have questions. COMMON BRAND NAME(S): Prolia, XGEVA What should I tell my care team before I take this medication? They need to know if you have any of these conditions: dental disease having surgery or tooth extraction infection kidney disease low levels of calcium or Vitamin D in the blood malnutrition on hemodialysis skin conditions or sensitivity thyroid or parathyroid disease an unusual reaction to denosumab, other medicines, foods, dyes, or preservatives pregnant or trying to get pregnant breast-feeding How should I use this medication? This medicine is for injection under the skin. It is given by a health care professional in a hospital or clinic setting. A special MedGuide will be given to you before each treatment. Be sure to read this information carefully each time. For Prolia, talk to your pediatrician regarding the use of this medicine in children. Special care may be needed. For Xgeva, talk to your pediatrician regarding the use of this medicine in children. While this drug may be prescribed for children as young as 13 years for selected conditions, precautions do apply. Overdosage: If you think you have taken too much of this medicine contact a poison control center or emergency room at once. NOTE: This medicine is only for you. Do not share this medicine with others. What if I miss a dose? It is important not to miss your dose.  Call your doctor or health care professional if you are unable to keep an appointment. What may interact with this medication? Do not take this medicine with any of the following medications: other medicines containing denosumab This medicine may also interact with the following medications: medicines that lower your chance of fighting infection steroid medicines like prednisone or cortisone This list may not describe all possible interactions. Give your health care provider a list of all the medicines, herbs, non-prescription drugs, or dietary supplements you use. Also tell them if you smoke, drink alcohol, or use illegal drugs. Some items may interact with your medicine. What should I watch for while using this medication? Visit your doctor or health care professional for regular checks on your progress. Your doctor or health care professional may order blood tests and other tests to see how you are doing. Call your doctor or health care professional for advice if you get a fever, chills or sore throat, or other symptoms of a cold or flu. Do not treat yourself. This drug may decrease your body's ability to fight infection. Try to avoid being around people who are sick. You should make sure you get enough calcium and vitamin D while you are taking this medicine, unless your doctor tells you not to. Discuss the foods you eat and the vitamins you take with your health care professional. See your dentist regularly. Brush and floss your teeth as directed. Before you have any dental work done, tell your dentist you are receiving this medicine. Do not become pregnant while taking this medicine or for 5 months after   stopping it. Talk with your doctor or health care professional about your birth control options while taking this medicine. Women should inform their doctor if they wish to become pregnant or think they might be pregnant. There is a potential for serious side effects to an unborn child. Talk to  your health care professional or pharmacist for more information. ?What side effects may I notice from receiving this medication? ?Side effects that you should report to your doctor or health care professional as soon as possible: ?allergic reactions like skin rash, itching or hives, swelling of the face, lips, or tongue ?bone pain ?breathing problems ?dizziness ?jaw pain, especially after dental work ?redness, blistering, peeling of the skin ?signs and symptoms of infection like fever or chills; cough; sore throat; pain or trouble passing urine ?signs of low calcium like fast heartbeat, muscle cramps or muscle pain; pain, tingling, numbness in the hands or feet; seizures ?unusual bleeding or bruising ?unusually weak or tired ?Side effects that usually do not require medical attention (report to your doctor or health care professional if they continue or are bothersome): ?constipation ?diarrhea ?headache ?joint pain ?loss of appetite ?muscle pain ?runny nose ?tiredness ?upset stomach ?This list may not describe all possible side effects. Call your doctor for medical advice about side effects. You may report side effects to FDA at 1-800-FDA-1088. ?Where should I keep my medication? ?This medicine is only given in a clinic, doctor's office, or other health care setting and will not be stored at home. ?NOTE: This sheet is a summary. It may not cover all possible information. If you have questions about this medicine, talk to your doctor, pharmacist, or health care provider. ?? 2023 Elsevier/Gold Standard (2017-08-20 00:00:00) ? ?

## 2022-01-12 NOTE — Assessment & Plan Note (Signed)
Originally diagnosed in 1995, patient did not take prescribed tamoxifen. Now presenting with metastatic disease based on scans done 03/11/2020 CT CAP: Numerous lytic lesions in the thoracic spine several with expansion extraosseous extension. 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 Uncertain if any radiation was given to the skull lesions.  CurrentTreatment: letrozole. StartedDecember 2021 Letrozoletoxicities:Denies any side effects to letrozole  Poor nutritional status: Since the tube feedings have started her weight is improving. Energy levels are improving and she is got a better positive outlook.   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 No evidence of visceral metastatic disease. CT CAP 04/15/2021: No substantial interval change similar bone metastases CT CAP: 10/16/21: Enlarging Rt Subpectoral/ Axill LN, stable bone mets. PET CT scan 1/88/4166: Hypermetabolic right subpectoral/axillary lymph node, stable bone metastases  Return to clinic in 3 months for lab injection and follow-up.  We will perform another CT scan in 3 months.  If that also remains stable then we will go to every 4- 6 months.

## 2022-01-27 ENCOUNTER — Telehealth: Payer: Self-pay | Admitting: *Deleted

## 2022-01-27 NOTE — Telephone Encounter (Signed)
Received VM from pt requesting advice from MD if okay to take [place] from [facility] for memory loss.  RN reviewed with MD and verbalized okay to proceed.  RN attempt x1 to return call to pt.  No answer.  Unable to LVM due to VM being full.

## 2022-03-21 ENCOUNTER — Other Ambulatory Visit: Payer: Self-pay | Admitting: Physician Assistant

## 2022-03-21 DIAGNOSIS — R058 Other specified cough: Secondary | ICD-10-CM

## 2022-03-27 ENCOUNTER — Telehealth: Payer: Self-pay | Admitting: *Deleted

## 2022-03-27 NOTE — Telephone Encounter (Signed)
Connected with Sarina Ser for clarification of FMLA needs and to obtain page one of form.   Page 1 e-mailed to [email or web address].  Leave of absence renewal for [employer] Daughter Mechele Claude completed by this nurse today.  Form to collaborative pickup bin for provider review, signature and return.

## 2022-04-01 ENCOUNTER — Other Ambulatory Visit: Payer: Self-pay | Admitting: Hematology and Oncology

## 2022-04-01 MED ORDER — LORAZEPAM 0.5 MG PO TABS: 0.5000 mg | ORAL_TABLET | Freq: Three times a day (TID) | ORAL | 0 refills | Status: DC

## 2022-04-10 ENCOUNTER — Other Ambulatory Visit: Payer: Self-pay

## 2022-04-10 ENCOUNTER — Inpatient Hospital Stay: Payer: Medicare Other | Attending: Hematology and Oncology

## 2022-04-10 DIAGNOSIS — C7951 Secondary malignant neoplasm of bone: Secondary | ICD-10-CM | POA: Diagnosis present

## 2022-04-10 DIAGNOSIS — Z79811 Long term (current) use of aromatase inhibitors: Secondary | ICD-10-CM | POA: Insufficient documentation

## 2022-04-10 DIAGNOSIS — C50911 Malignant neoplasm of unspecified site of right female breast: Secondary | ICD-10-CM | POA: Insufficient documentation

## 2022-04-10 DIAGNOSIS — C50412 Malignant neoplasm of upper-outer quadrant of left female breast: Secondary | ICD-10-CM

## 2022-04-10 LAB — CMP (CANCER CENTER ONLY)
ALT: 15 U/L (ref 0–44)
AST: 22 U/L (ref 15–41)
Albumin: 4 g/dL (ref 3.5–5.0)
Alkaline Phosphatase: 59 U/L (ref 38–126)
Anion gap: 4 — ABNORMAL LOW (ref 5–15)
BUN: 26 mg/dL — ABNORMAL HIGH (ref 8–23)
CO2: 31 mmol/L (ref 22–32)
Calcium: 9.7 mg/dL (ref 8.9–10.3)
Chloride: 104 mmol/L (ref 98–111)
Creatinine: 0.8 mg/dL (ref 0.44–1.00)
GFR, Estimated: >60 mL/min (ref >60)
Glucose, Bld: 97 mg/dL (ref 70–99)
Potassium: 4.4 mmol/L (ref 3.5–5.1)
Sodium: 139 mmol/L (ref 135–145)
Total Bilirubin: 0.7 mg/dL (ref 0.3–1.2)
Total Protein: 6.7 g/dL (ref 6.5–8.1)

## 2022-04-10 LAB — CBC WITH DIFFERENTIAL (CANCER CENTER ONLY)
Abs Immature Granulocytes: 0.01 10*3/uL (ref 0.00–0.07)
Basophils Absolute: 0 10*3/uL (ref 0.0–0.1)
Basophils Relative: 1 %
Eosinophils Absolute: 0.2 10*3/uL (ref 0.0–0.5)
Eosinophils Relative: 3 %
HCT: 43.3 % (ref 36.0–46.0)
Hemoglobin: 14.7 g/dL (ref 12.0–15.0)
Immature Granulocytes: 0 %
Lymphocytes Relative: 12 %
Lymphs Abs: 0.6 10*3/uL — ABNORMAL LOW (ref 0.7–4.0)
MCH: 32.3 pg (ref 26.0–34.0)
MCHC: 33.9 g/dL (ref 30.0–36.0)
MCV: 95.2 fL (ref 80.0–100.0)
Monocytes Absolute: 0.6 10*3/uL (ref 0.1–1.0)
Monocytes Relative: 11 %
Neutro Abs: 3.7 10*3/uL (ref 1.7–7.7)
Neutrophils Relative %: 73 %
Platelet Count: 197 10*3/uL (ref 150–400)
RBC: 4.55 MIL/uL (ref 3.87–5.11)
RDW: 13.7 % (ref 11.5–15.5)
WBC Count: 5.1 10*3/uL (ref 4.0–10.5)
nRBC: 0 % (ref 0.0–0.2)

## 2022-04-13 ENCOUNTER — Ambulatory Visit (HOSPITAL_COMMUNITY)
Admission: RE | Admit: 2022-04-13 | Discharge: 2022-04-13 | Disposition: A | Discharge status: Home / Self Care | Payer: Medicare Other | Source: Ambulatory Visit | Attending: Hematology and Oncology | Admitting: Hematology and Oncology

## 2022-04-13 DIAGNOSIS — C7951 Secondary malignant neoplasm of bone: Secondary | ICD-10-CM

## 2022-04-13 MED ORDER — IOHEXOL 300 MG/ML  SOLN
100.0000 mL | Freq: Once | INTRAMUSCULAR | Status: AC | PRN
  Administered 2022-04-13: 100 mL via INTRAVENOUS

## 2022-04-14 NOTE — Progress Notes (Signed)
Patient Care Team: Hali Marry, MD as PCP - General (Family Medicine) Nicholas Lose, MD as Consulting Physician (Hematology and Oncology)  DIAGNOSIS: No diagnosis found.  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  Moved to [facility] December 2021 to be closer to her family     CHIEF COMPLIANT:  Follow-up metastatic breast cancer currently on anastrozole.    INTERVAL HISTORY: Jodi Kaufman is a 81 y.o. with above-mentioned history of metastatic breast cancer currently on anastrozole. She presents to the clinic today for a follow-up.    ALLERGIES:  is allergic to atorvastatin, bactrim [sulfamethoxazole-trimethoprim], clavulanic acid, guaifenesin & derivatives, and tamoxifen.  MEDICATIONS:  Current Outpatient Medications  Medication Sig Dispense Refill   LORazepam (ATIVAN) 0.5 MG tablet Take 1 tablet (0.5 mg total) by mouth every 8 (eight) hours. 10 tablet 0   acetaminophen (TYLENOL) 160 MG/5ML liquid Take 500 mg by mouth every 4 (four) hours as needed for fever.     anastrozole (ARIMIDEX) 1 MG tablet Take 1 tablet (1 mg total) by mouth daily. 90 tablet 3   calcium citrate-vitamin D (CITRACAL+D) 315-200 MG-UNIT tablet Take 1 tablet by mouth daily. (Patient not taking: Reported on 01/12/2022)     VENTOLIN HFA 108 (90 Base) MCG/ACT inhaler TAKE 2 PUFFS BY MOUTH EVERY 6 HOURS AS NEEDED FOR WHEEZE OR SHORTNESS OF BREATH (Patient not taking: Reported on 01/12/2022) 18 each 1    No current facility-administered medications for this visit.    PHYSICAL EXAMINATION: ECOG PERFORMANCE STATUS: {CHL ONC ECOG PS:[id number]}  There were no vitals filed for this visit. There were no vitals filed for this visit.  BREAST:*** No palpable masses or nodules in either right or left breasts. No palpable axillary supraclavicular or infraclavicular adenopathy no breast tenderness or nipple discharge. (exam performed in the presence of a chaperone)  LABORATORY DATA:  I have reviewed the data as listed    Latest Ref Rng & Units 04/10/2022    1:49 PM 01/12/2022    2:10 PM 10/14/2021    3:07 PM  CMP  Glucose 70 - 99 mg/dL 97  110  104   BUN 8 - 23 mg/dL _0 Creatinine 0.44 - 1.00 mg/dL 0.80  0.77  0.83   Sodium 135 - 145 mmol/L 139  137  138   Potassium 3.5 - 5.1 mmol/L 4.4  4.6  4.4   Chloride 98 - 111 mmol/L 104  105  104   CO2 22 - 32 mmol/L _1 Calcium 8.9 - 10.3 mg/dL 9.7  9.3  9.5   Total Protein 6.5 - 8.1 g/dL 6.7  7.0  6.8   Total Bilirubin 0.3 - 1.2 mg/dL 0.7  0.7  0.5   Alkaline Phos 38 - 126 U/L 59  64  63   AST 15 - 41 U/L _2 ALT 0 - 44 U/L _3 Lab Results  Component Value Date   WBC 5.1 04/10/2022   HGB 14.7 04/10/2022   HCT 43.3 04/10/2022   MCV 95.2 04/10/2022   PLT 197 04/10/2022   NEUTROABS 3.7 04/10/2022    ASSESSMENT & PLAN:  No problem-specific Assessment & Plan notes found for this encounter.    No orders of the defined types were placed in this encounter.  The patient has a good understanding of the overall plan. she agrees with it. she will call with any problems that may develop before the next visit here. Total time spent: 30 mins including face to face time and time spent for planning, charting and co-ordination of care   Suzzette Righter, CMA 04/14/22    Nicholas Lose MD

## 2022-04-15 ENCOUNTER — Inpatient Hospital Stay (HOSPITAL_BASED_OUTPATIENT_CLINIC_OR_DEPARTMENT_OTHER): Payer: Medicare Other | Admitting: Hematology and Oncology

## 2022-04-15 ENCOUNTER — Ambulatory Visit: Payer: Medicare Other

## 2022-04-15 ENCOUNTER — Other Ambulatory Visit: Payer: Self-pay

## 2022-04-15 ENCOUNTER — Ambulatory Visit: Payer: Medicare Other | Admitting: Hematology and Oncology

## 2022-04-15 ENCOUNTER — Inpatient Hospital Stay: Payer: Medicare Other

## 2022-04-15 VITALS — BP 133/76 | HR 82 | Temp 98.1°F | Resp 17 | Wt 144.5 lb

## 2022-04-15 DIAGNOSIS — C50911 Malignant neoplasm of unspecified site of right female breast: Secondary | ICD-10-CM | POA: Diagnosis not present

## 2022-04-15 DIAGNOSIS — C50919 Malignant neoplasm of unspecified site of unspecified female breast: Secondary | ICD-10-CM | POA: Diagnosis not present

## 2022-04-15 DIAGNOSIS — C7951 Secondary malignant neoplasm of bone: Secondary | ICD-10-CM | POA: Diagnosis not present

## 2022-04-15 MED ORDER — DENOSUMAB 120 MG/1.7ML ~~LOC~~ SOLN
120.0000 mg | Freq: Once | SUBCUTANEOUS | Status: AC
  Administered 2022-04-15: 120 mg via SUBCUTANEOUS
  Filled 2022-04-15: qty 1.7

## 2022-04-15 NOTE — Assessment & Plan Note (Signed)
Originally diagnosed in 1995, patient did not take prescribed tamoxifen. Now presenting with metastatic disease based on scans done 03/11/2020 CT CAP: Numerous lytic lesions in the thoracic spine several with expansion extraosseous extension. 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  Uncertain if any radiation was given to the skull lesions.   Current Treatment:   letrozole. Started December 2021 Letrozole toxicities: Denies any side effects to letrozole   Poor nutritional status: Since the tube feedings have started her weight is improving.  Energy levels are improving and she is got a better positive outlook.    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  No evidence of visceral metastatic disease. CT CAP 04/15/2021: No substantial interval change similar bone metastases CT CAP: 10/16/21: Enlarging Rt Subpectoral/ Axill LN, stable bone mets. PET CT scan 12/14/6013: Hypermetabolic right subpectoral/axillary lymph node, stable bone metastases CT CAP 04/14/2022: Stable findings.  Right axillary and subpectoral lymph nodes are similar, bone metastases similar to before  Return to clinic in 3 months for lab injection and follow-up.  We will perform another CT scan in 6 months.

## 2022-04-16 ENCOUNTER — Ambulatory Visit: Payer: Medicare Other | Admitting: Hematology and Oncology

## 2022-04-16 ENCOUNTER — Ambulatory Visit: Payer: Medicare Other

## 2022-04-17 ENCOUNTER — Other Ambulatory Visit: Payer: Self-pay | Admitting: *Deleted

## 2022-04-17 DIAGNOSIS — C50919 Malignant neoplasm of unspecified site of unspecified female breast: Secondary | ICD-10-CM

## 2022-04-17 DIAGNOSIS — N6489 Other specified disorders of breast: Secondary | ICD-10-CM

## 2022-04-30 ENCOUNTER — Ambulatory Visit (HOSPITAL_COMMUNITY): Payer: Medicare Other

## 2022-05-22 ENCOUNTER — Telehealth (INDEPENDENT_AMBULATORY_CARE_PROVIDER_SITE_OTHER): Payer: Medicare Other | Admitting: Family Medicine

## 2022-05-22 DIAGNOSIS — J329 Chronic sinusitis, unspecified: Secondary | ICD-10-CM | POA: Diagnosis not present

## 2022-05-22 DIAGNOSIS — Z978 Presence of other specified devices: Secondary | ICD-10-CM

## 2022-05-22 MED ORDER — CEFDINIR 300 MG PO CAPS: 300.0000 mg | ORAL_CAPSULE | Freq: Two times a day (BID) | ORAL | 0 refills | Status: DC

## 2022-05-22 NOTE — Progress Notes (Signed)
Pt reports that her sxs have been going on for months. Her sxs have been worse for the past 4 weeks. She  feels worn down and tired. She said that she noticed 3 days ago she had a temp the highest that it has gotten was 99.6 she stated that she last took her temp about 6 am. She has been taking Tylenol (liquid) she takes this 3 times a day.   She has yellow, cloudy stuff that comes from her nose. She is home bound. She hasn't taken a Covid test since around Christmas.   She feels that her sxs may be coming from being depressed because she doesn't get out and go anywhere. She stated that her car is broke down

## 2022-05-22 NOTE — Progress Notes (Signed)
    Virtual Visit via Video Note  I connected with [place] on 05/22/22 at  1:00 PM EST by a video enabled telemedicine application and verified that I am speaking with the correct person using two identifiers.   I discussed the limitations of evaluation and management by telemedicine and the availability of in person appointments. The patient expressed understanding and agreed to proceed.  Patient location: at home Provider location: in office  Subjective:    CC:  No chief complaint on file.   HPI: Pt reports that her sxs have been going on for months. Her sxs have been worse for the past 4 weeks. She  feels worn down and tired. She said that she noticed 3 days ago she had a temp the highest that it has gotten was 99.6 she stated that she last took her temp about 6 am. She has been taking Tylenol (liquid) she takes this 3 times a day. Has had a few headaches.    She has lots yellow, cloudy stuff that comes from her nose. She is home bound. She hasn't taken a Covid test since around Christmas.    She feels that her sxs may be coming from being depressed because she doesn't get out and go anywhere. She stated that her car is broke down   She is still on tube feeds and sometime has some reflux. Gets some excess mucous with this. Thinks needs to get back in with her dentist bc she feels her gums are shrinking.    Past medical history, Surgical history, Family history not pertinant except as noted below, Social history, Allergies, and medications have been entered into the medical record, reviewed, and corrections made.    Objective:    General: Speaking clearly in complete sentences without any shortness of breath.  Alert and oriented x3.  Normal judgment. No apparent acute distress.    Impression and Recommendations:    Problem List Items Addressed This Visit       Other   Uses feeding tube   Other Visit Diagnoses     Chronic sinusitis, unspecified location    -   Primary   Relevant Medications   cefdinir (OMNICEF) 300 MG capsule      Sinusitis  - she said she did well with the antibiotic that we prescribed last spring.  Some prescription [facility].  She can call if she is not feeling tremendously better.  She also wanted to know if she could start a's very specific multivitamin she is can get me the name of it and I can double check.  No orders of the defined types were placed in this encounter.   Meds ordered this encounter  Medications   cefdinir (OMNICEF) 300 MG capsule    Sig: Take 1 capsule (300 mg total) by mouth 2 (two) times daily.    Dispense:  14 capsule    Refill:  0     I discussed the assessment and treatment plan with the patient. The patient was provided an opportunity to ask questions and all were answered. The patient agreed with the plan and demonstrated an understanding of the instructions.   The patient was advised to call back or seek an in-person evaluation if the symptoms worsen or if the condition fails to improve as anticipated.   [name], MD

## 2022-06-01 ENCOUNTER — Telehealth: Payer: Self-pay | Admitting: *Deleted

## 2022-06-01 NOTE — Telephone Encounter (Signed)
Pt called and lvm stating that she is not getting any better and was advised by Dr. Madilyn Fireman to call back for more medication if she wasn't feeling any better.   Pt would like Dr. Madilyn Fireman to send in more medication for her.

## 2022-06-02 MED ORDER — AZITHROMYCIN 250 MG PO TABS: ORAL_TABLET | ORAL | 0 refills | Status: AC

## 2022-06-02 NOTE — Telephone Encounter (Signed)
New med sent , if not better ine one week needs appt  Meds ordered this encounter  Medications   azithromycin (ZITHROMAX) 250 MG tablet    Sig: 2 Ttabs PO on Day 1, then one a day x 4 days.    Dispense:  6 tablet    Refill:  0

## 2022-06-03 NOTE — Telephone Encounter (Signed)
She can start it toda. Not sure about crushing. She will need to speak to the pharmacist about that

## 2022-06-03 NOTE — Telephone Encounter (Signed)
Patient informed. 

## 2022-06-10 ENCOUNTER — Telehealth: Payer: Self-pay | Admitting: *Deleted

## 2022-06-10 NOTE — Telephone Encounter (Signed)
I called Jodi Kaufman back and the phone went to her VM and I was unable to leave a message for her to return the call.

## 2022-06-10 NOTE — Telephone Encounter (Signed)
Pt called and LVM stating that her hand got smashed in the door 2 nights ago. She said that her hand is swollen. She wants to have an xray done.

## 2022-06-12 ENCOUNTER — Telehealth: Payer: Self-pay | Admitting: *Deleted

## 2022-06-12 NOTE — Telephone Encounter (Signed)
Received VM from pt requesting advice from MD if okay to take an advance multivitamin by [facility] Lesli Albee MD.  Per MD okay to proceed.  RN attempt x1 to return call to pt.  No answer and unable to LVM due to VM being full.

## 2022-06-30 ENCOUNTER — Telehealth: Payer: Self-pay

## 2022-06-30 NOTE — Telephone Encounter (Signed)
Pt called and LVM requesting MD appt when she comes in for lab and inj 3/26. MD will be on PAL that day, but he is available  (as of right now) 07/17/22 at 0800. Called pt to offer 0730 lab, 0800 MD visit, and 0845 inj. Pt did not answer and her VM box is full.

## 2022-07-07 ENCOUNTER — Telehealth: Payer: Self-pay | Admitting: Hematology and Oncology

## 2022-07-07 NOTE — Telephone Encounter (Signed)
Rescheduled appointment per staff message. Unable to leave a voicemail due to mailbox being full.

## 2022-07-14 ENCOUNTER — Inpatient Hospital Stay: Payer: Medicare Other

## 2022-07-21 ENCOUNTER — Inpatient Hospital Stay: Payer: Medicare Other

## 2022-07-21 ENCOUNTER — Inpatient Hospital Stay: Payer: Medicare Other | Attending: Hematology and Oncology

## 2022-07-21 ENCOUNTER — Other Ambulatory Visit: Payer: Self-pay

## 2022-07-21 VITALS — BP 112/64 | HR 77 | Temp 97.8°F | Resp 16

## 2022-07-21 DIAGNOSIS — C50911 Malignant neoplasm of unspecified site of right female breast: Secondary | ICD-10-CM | POA: Diagnosis present

## 2022-07-21 DIAGNOSIS — Z79811 Long term (current) use of aromatase inhibitors: Secondary | ICD-10-CM | POA: Diagnosis not present

## 2022-07-21 DIAGNOSIS — C50919 Malignant neoplasm of unspecified site of unspecified female breast: Secondary | ICD-10-CM

## 2022-07-21 DIAGNOSIS — Z170 Estrogen receptor positive status [ER+]: Secondary | ICD-10-CM | POA: Diagnosis not present

## 2022-07-21 DIAGNOSIS — C7951 Secondary malignant neoplasm of bone: Secondary | ICD-10-CM | POA: Insufficient documentation

## 2022-07-21 LAB — CBC WITH DIFFERENTIAL (CANCER CENTER ONLY)
Abs Immature Granulocytes: 0.01 10*3/uL (ref 0.00–0.07)
Basophils Absolute: 0 10*3/uL (ref 0.0–0.1)
Basophils Relative: 1 %
Eosinophils Absolute: 0.2 10*3/uL (ref 0.0–0.5)
Eosinophils Relative: 3 %
HCT: 44.8 % (ref 36.0–46.0)
Hemoglobin: 14.8 g/dL (ref 12.0–15.0)
Immature Granulocytes: 0 %
Lymphocytes Relative: 12 %
Lymphs Abs: 0.8 10*3/uL (ref 0.7–4.0)
MCH: 31.6 pg (ref 26.0–34.0)
MCHC: 33 g/dL (ref 30.0–36.0)
MCV: 95.7 fL (ref 80.0–100.0)
Monocytes Absolute: 0.6 10*3/uL (ref 0.1–1.0)
Monocytes Relative: 10 %
Neutro Abs: 4.6 10*3/uL (ref 1.7–7.7)
Neutrophils Relative %: 74 %
Platelet Count: 209 10*3/uL (ref 150–400)
RBC: 4.68 MIL/uL (ref 3.87–5.11)
RDW: 14.1 % (ref 11.5–15.5)
WBC Count: 6.2 10*3/uL (ref 4.0–10.5)
nRBC: 0 % (ref 0.0–0.2)

## 2022-07-21 LAB — CMP (CANCER CENTER ONLY)
ALT: 14 U/L (ref 0–44)
AST: 23 U/L (ref 15–41)
Albumin: 4.1 g/dL (ref 3.5–5.0)
Alkaline Phosphatase: 54 U/L (ref 38–126)
Anion gap: 5 (ref 5–15)
BUN: 30 mg/dL — ABNORMAL HIGH (ref 8–23)
CO2: 30 mmol/L (ref 22–32)
Calcium: 9.4 mg/dL (ref 8.9–10.3)
Chloride: 103 mmol/L (ref 98–111)
Creatinine: 0.89 mg/dL (ref 0.44–1.00)
GFR, Estimated: >60 mL/min (ref >60)
Glucose, Bld: 89 mg/dL (ref 70–99)
Potassium: 4.6 mmol/L (ref 3.5–5.1)
Sodium: 138 mmol/L (ref 135–145)
Total Bilirubin: 0.6 mg/dL (ref 0.3–1.2)
Total Protein: 6.8 g/dL (ref 6.5–8.1)

## 2022-07-21 MED ORDER — DENOSUMAB 120 MG/1.7ML ~~LOC~~ SOLN
120.0000 mg | Freq: Once | SUBCUTANEOUS | Status: AC
  Administered 2022-07-21: 120 mg via SUBCUTANEOUS
  Filled 2022-07-21: qty 1.7

## 2022-07-31 ENCOUNTER — Inpatient Hospital Stay: Payer: Medicare Other | Admitting: Hematology and Oncology

## 2022-07-31 ENCOUNTER — Telehealth: Payer: Self-pay | Admitting: *Deleted

## 2022-07-31 ENCOUNTER — Encounter: Payer: Self-pay | Admitting: *Deleted

## 2022-07-31 ENCOUNTER — Inpatient Hospital Stay: Payer: Medicare Other

## 2022-07-31 NOTE — Telephone Encounter (Signed)
Received call from pt requesting letter be mailed to her stating she is under the treatment of MD at our office.  RN successfully mailed letter to address on file per pt request.

## 2022-09-09 ENCOUNTER — Telehealth: Payer: Self-pay | Admitting: Hematology and Oncology

## 2022-09-30 ENCOUNTER — Telehealth: Payer: Self-pay | Admitting: *Deleted

## 2022-09-30 ENCOUNTER — Other Ambulatory Visit: Payer: Self-pay | Admitting: Hematology and Oncology

## 2022-09-30 ENCOUNTER — Other Ambulatory Visit: Payer: Self-pay | Admitting: *Deleted

## 2022-09-30 DIAGNOSIS — Z431 Encounter for attention to gastrostomy: Secondary | ICD-10-CM

## 2022-09-30 DIAGNOSIS — C50919 Malignant neoplasm of unspecified site of unspecified female breast: Secondary | ICD-10-CM

## 2022-09-30 MED ORDER — LORAZEPAM 0.5 MG PO TABS: 0.5000 mg | ORAL_TABLET | Freq: Three times a day (TID) | ORAL | 0 refills | Status: DC

## 2022-09-30 NOTE — Telephone Encounter (Signed)
Received VM from pt with complaint of ongoing dizziness.  RN attempt x1 to return call.  No answer and unable to LVM due to VM being full.

## 2022-09-30 NOTE — Progress Notes (Signed)
Received call from pt stating she is taking an OTC medication called Brain Zone to improve memory and believes that dizziness could be contributed to this.  Per MD pt needing to stop Brain Zone x 2 weeks to see if symptoms improve.  If not, pt will need to hold Anastrozole and monitor symptoms. Pt educated and verbalized understanding.   RN also received message from nutrition team stating that pt is not currently under active tx and referral needs to be canceled.  Referral removed.

## 2022-09-30 NOTE — Progress Notes (Signed)
Per MD request referral placed to nutrition since pt is on tube feedings with Jevity.

## 2022-09-30 NOTE — Telephone Encounter (Signed)
Received call from pt requesting prescription for Ativan for anxiety during upcoming CT scan. Verbal orders received from MD for pt to receive Lorazepam 0.5 mg p.o 1 hour prior to imaging.  May repeat X 1 if needed.  Prescription called in to pharmacy on file. Pt educated on medication instructions and verbalized understanding.

## 2022-09-30 NOTE — Telephone Encounter (Signed)
Received call from pt with complaint of ongoing dizziness.  Pt states symptoms begin in the morning when she does her AM tube feeding with Jevity and take her Anastrozole tablet.  Pt states symptoms last for a few hours and then are gone by the afternoon.  Pt denies headache or double vision.  Per MD pt needing to stop Anastrozole x2 weeks to see if symptoms improve. If symptoms do not improve, MD states a brain MRI may need to be ordered for further evaluation.  Pt currently scheduled to f.u with MD in 1.5 weeks and will discuss symptoms at that time, pt notified and verbalized understanding.

## 2022-10-02 ENCOUNTER — Inpatient Hospital Stay: Payer: Medicare Other | Attending: Hematology and Oncology

## 2022-10-02 ENCOUNTER — Other Ambulatory Visit: Payer: Self-pay

## 2022-10-02 ENCOUNTER — Inpatient Hospital Stay: Payer: Medicare Other

## 2022-10-02 DIAGNOSIS — C7951 Secondary malignant neoplasm of bone: Secondary | ICD-10-CM | POA: Diagnosis present

## 2022-10-02 DIAGNOSIS — Z79818 Long term (current) use of other agents affecting estrogen receptors and estrogen levels: Secondary | ICD-10-CM | POA: Insufficient documentation

## 2022-10-02 DIAGNOSIS — Z931 Gastrostomy status: Secondary | ICD-10-CM | POA: Insufficient documentation

## 2022-10-02 DIAGNOSIS — Z853 Personal history of malignant neoplasm of breast: Secondary | ICD-10-CM | POA: Insufficient documentation

## 2022-10-02 DIAGNOSIS — Z79811 Long term (current) use of aromatase inhibitors: Secondary | ICD-10-CM | POA: Diagnosis not present

## 2022-10-02 DIAGNOSIS — Z923 Personal history of irradiation: Secondary | ICD-10-CM | POA: Diagnosis not present

## 2022-10-02 DIAGNOSIS — Z7982 Long term (current) use of aspirin: Secondary | ICD-10-CM | POA: Insufficient documentation

## 2022-10-02 DIAGNOSIS — Z9011 Acquired absence of right breast and nipple: Secondary | ICD-10-CM | POA: Insufficient documentation

## 2022-10-02 DIAGNOSIS — Z170 Estrogen receptor positive status [ER+]: Secondary | ICD-10-CM

## 2022-10-02 LAB — CBC WITH DIFFERENTIAL (CANCER CENTER ONLY)
Abs Immature Granulocytes: 0.02 10*3/uL (ref 0.00–0.07)
Basophils Absolute: 0 10*3/uL (ref 0.0–0.1)
Basophils Relative: 1 %
Eosinophils Absolute: 0.2 10*3/uL (ref 0.0–0.5)
Eosinophils Relative: 3 %
HCT: 43.2 % (ref 36.0–46.0)
Hemoglobin: 14.2 g/dL (ref 12.0–15.0)
Immature Granulocytes: 0 %
Lymphocytes Relative: 10 %
Lymphs Abs: 0.6 10*3/uL — ABNORMAL LOW (ref 0.7–4.0)
MCH: 31.5 pg (ref 26.0–34.0)
MCHC: 32.9 g/dL (ref 30.0–36.0)
MCV: 95.8 fL (ref 80.0–100.0)
Monocytes Absolute: 0.6 10*3/uL (ref 0.1–1.0)
Monocytes Relative: 10 %
Neutro Abs: 4.6 10*3/uL (ref 1.7–7.7)
Neutrophils Relative %: 76 %
Platelet Count: 205 10*3/uL (ref 150–400)
RBC: 4.51 MIL/uL (ref 3.87–5.11)
RDW: 14 % (ref 11.5–15.5)
WBC Count: 6.2 10*3/uL (ref 4.0–10.5)
nRBC: 0 % (ref 0.0–0.2)

## 2022-10-02 LAB — CMP (CANCER CENTER ONLY)
ALT: 16 U/L (ref 0–44)
AST: 23 U/L (ref 15–41)
Albumin: 4 g/dL (ref 3.5–5.0)
Alkaline Phosphatase: 58 U/L (ref 38–126)
Anion gap: 4 — ABNORMAL LOW (ref 5–15)
BUN: 29 mg/dL — ABNORMAL HIGH (ref 8–23)
CO2: 30 mmol/L (ref 22–32)
Calcium: 9.4 mg/dL (ref 8.9–10.3)
Chloride: 104 mmol/L (ref 98–111)
Creatinine: 0.87 mg/dL (ref 0.44–1.00)
GFR, Estimated: >60 mL/min (ref >60)
Glucose, Bld: 100 mg/dL — ABNORMAL HIGH (ref 70–99)
Potassium: 4.4 mmol/L (ref 3.5–5.1)
Sodium: 138 mmol/L (ref 135–145)
Total Bilirubin: 0.7 mg/dL (ref 0.3–1.2)
Total Protein: 7 g/dL (ref 6.5–8.1)

## 2022-10-06 ENCOUNTER — Other Ambulatory Visit (HOSPITAL_COMMUNITY): Payer: Medicare Other

## 2022-10-06 ENCOUNTER — Ambulatory Visit (HOSPITAL_COMMUNITY)
Admission: RE | Admit: 2022-10-06 | Discharge: 2022-10-06 | Disposition: A | Discharge status: Home / Self Care | Payer: Medicare Other | Source: Ambulatory Visit | Attending: Hematology and Oncology | Admitting: Hematology and Oncology

## 2022-10-06 ENCOUNTER — Ambulatory Visit (HOSPITAL_COMMUNITY): Admission: RE | Admit: 2022-10-06 | Payer: Medicare Other | Source: Ambulatory Visit

## 2022-10-06 DIAGNOSIS — C50919 Malignant neoplasm of unspecified site of unspecified female breast: Secondary | ICD-10-CM | POA: Diagnosis not present

## 2022-10-06 DIAGNOSIS — C7951 Secondary malignant neoplasm of bone: Secondary | ICD-10-CM | POA: Diagnosis present

## 2022-10-06 MED ORDER — IOHEXOL 300 MG/ML  SOLN
100.0000 mL | Freq: Once | INTRAMUSCULAR | Status: AC | PRN
  Administered 2022-10-06: 100 mL via INTRAVENOUS

## 2022-10-06 MED ORDER — SODIUM CHLORIDE (PF) 0.9 % IJ SOLN
INTRAMUSCULAR | Status: AC
  Filled 2022-10-06: qty 50

## 2022-10-07 ENCOUNTER — Telehealth: Payer: Self-pay | Admitting: Hematology and Oncology

## 2022-10-07 NOTE — Telephone Encounter (Signed)
Contacted patient to scheduled appointments. Left message with appointment details and a call back number if patient had any questions or could not accommodate the time we provided.   

## 2022-10-09 ENCOUNTER — Inpatient Hospital Stay: Payer: Medicare Other | Admitting: Hematology and Oncology

## 2022-10-09 ENCOUNTER — Inpatient Hospital Stay: Payer: Medicare Other

## 2022-10-15 ENCOUNTER — Inpatient Hospital Stay (HOSPITAL_BASED_OUTPATIENT_CLINIC_OR_DEPARTMENT_OTHER): Payer: Medicare Other | Admitting: Hematology and Oncology

## 2022-10-15 ENCOUNTER — Other Ambulatory Visit: Payer: Self-pay

## 2022-10-15 ENCOUNTER — Inpatient Hospital Stay: Payer: Medicare Other

## 2022-10-15 ENCOUNTER — Other Ambulatory Visit: Payer: Self-pay | Admitting: *Deleted

## 2022-10-15 VITALS — BP 109/66 | HR 90 | Temp 98.1°F | Resp 18 | Ht 66.0 in | Wt 142.1 lb

## 2022-10-15 DIAGNOSIS — C7951 Secondary malignant neoplasm of bone: Secondary | ICD-10-CM | POA: Diagnosis not present

## 2022-10-15 DIAGNOSIS — Z431 Encounter for attention to gastrostomy: Secondary | ICD-10-CM

## 2022-10-15 DIAGNOSIS — C50919 Malignant neoplasm of unspecified site of unspecified female breast: Secondary | ICD-10-CM | POA: Diagnosis not present

## 2022-10-15 MED ORDER — FULVESTRANT 250 MG/5ML IM SOSY
500.0000 mg | PREFILLED_SYRINGE | Freq: Once | INTRAMUSCULAR | Status: AC
  Administered 2022-10-15: 500 mg via INTRAMUSCULAR
  Filled 2022-10-15: qty 10

## 2022-10-15 MED ORDER — DENOSUMAB 120 MG/1.7ML ~~LOC~~ SOLN
120.0000 mg | Freq: Once | SUBCUTANEOUS | Status: AC
  Administered 2022-10-15: 120 mg via SUBCUTANEOUS
  Filled 2022-10-15: qty 1.7

## 2022-10-15 NOTE — Progress Notes (Signed)
[facility] Cancer Follow up:    Jodi Games, MD [address] [place] [address]   DIAGNOSIS:  Cancer Staging  Primary malignant neoplasm of breast with metastasis [facility]) Staging form: Breast, AJCC 8th Edition - Clinical: Stage IV (pM1) - Signed by Loa Socks, NP on 10/15/2022   SUMMARY OF ONCOLOGIC HISTORY: Oncology History  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)  1995 Initial Biopsy   Right breast cancer stage IIb ER positive IDC s/p mastectomy and 2 months of tamoxifen, lost to follow-up (treatment at [facility])   03/12/2020 - 03/26/2020 Radiation Therapy   Palliative radiation to the brain and spine for brain metastases   03/14/2020 Initial Diagnosis   Scans on 03/14/2020: Multiple bone metastases and brain metastases, biopsy of the rib: Breast cancer ER 80% PR negative HER-2 negative.  Moved to [facility] December 2021 to be closer to her family   02/2020 -  Anti-estrogen oral therapy   Anastrozole daily   10/21/2020 Treatment Plan Change   Xgeva every 3 months   10/06/2022 Imaging   IMPRESSION: 1. Slight interval enlargement of bulky right subpectoral lymph nodes, consistent with worsened nodal metastatic disease. 2. Multiple unchanged mixed lytic and sclerotic osseous metastatic lesions, notable for a large, expansile lesion of the right second rib as well as numerous additional expansile rib lesions, thoracolumbar vertebral body lesions, and pelvic lesions. 3. No other evidence of metastatic disease in the chest, abdomen, or pelvis. 4. Coronary artery disease. 5. Aortic valve calcifications. Correlate for echocardiographic evidence of aortic valve dysfunction.   Aortic Atherosclerosis (ICD10-I70.0).     Electronically Signed   By: Jearld Lesch M.D.   On: 10/08/2022 07:16   10/15/2022 Cancer Staging   Staging form: Breast, AJCC 8th Edition - Clinical: Stage IV (pM1) - Signed  by Loa Socks, NP on 10/15/2022     CURRENT THERAPY: Anastrozole; Jodi Kaufman  INTERVAL HISTORY: Jodi Kaufman 82 y.o. female returns for metastatic breast cancer currently on anastrozole and Xgeva.  She had recent CT scans that showed evidence of progression of disease and she is here today accompanied by her daughter to discuss results.  She is in a wheelchair.  She is using PEG tube feeds for most of her nutrition she is able to take some food orally but not enough.   Patient Active Problem List   Diagnosis Date Noted   Cough productive of purulent sputum 09/08/2021   Uses feeding tube 09/08/2021   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(HCC) 08/23/2020   Encounter for PEG (percutaneous endoscopic gastrostomy) (HCC)    Protein-calorie malnutrition, severe 04/11/2020   Failure to thrive in adult 04/10/2020   Dehydration    Abnormal liver enzymes    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) 04/01/2020   RUQ pain 11/09/2019   Abnormal weight loss 11/09/2019   Family history of colon cancer 08/16/2019   Mild emphysema (HCC) 08/16/2019   Discrete lymph node 08/16/2019   Rheumatoid factor positive 07/06/2019   Recurrent UTI 05/17/2019   Hyperlipidemia 05/15/2018   GAD (generalized anxiety disorder) 02/09/2018   History of osteopenia 11/27/2015   GERD (gastroesophageal reflux disease) 11/27/2015   Vitamin D deficiency 11/06/2011   Overactive bladder 10/12/2011   History of right breast cancer 10/12/2011    is allergic to atorvastatin, bactrim [sulfamethoxazole-trimethoprim], clavulanic acid, guaifenesin & derivatives, and tamoxifen.  MEDICAL HISTORY: Past Medical History:  Diagnosis Date   Breast cancer (HCC)  SURGICAL HISTORY: Past Surgical History:  Procedure Laterality Date   BREAST BIOPSY     IR GASTROSTOMY TUBE MOD SED  04/15/2020   IR PATIENT EVAL TECH 0-60 MINS  08/22/2020   IR PATIENT EVAL TECH 0-60 MINS  08/29/2021   IR PATIENT EVAL  TECH 0-60 MINS  09/24/2021   MASTECTOMY     MASTECTOMY W/ SENTINEL NODE BIOPSY     Right. 18 years ago.      SOCIAL HISTORY: Social History   Socioeconomic History   Marital status: Single    Spouse name: Not on file   Number of children: Not on file   Years of education: Not on file   Highest education level: Not on file  Occupational History   Not on file  Tobacco Use   Smoking status: Never   Smokeless tobacco: Never  Substance and Sexual Activity   Alcohol use: Never    Comment: [place].     Drug use: Never   Sexual activity: Not Currently  Other Topics Concern   Not on file  Social History Narrative   Lives in [place] but stays with her daughter 1-2 weeks out of the month.     Social Determinants of Health   Financial Resource Strain: Not on file  Food Insecurity: Not on file  Transportation Needs: Not on file  Physical Activity: Not on file  Stress: Not on file  Social Connections: Not on file  Intimate Partner Violence: Not on file    FAMILY HISTORY: Family History  Problem Relation Age of Onset   Heart disease Mother    Anemia Mother        penicous anemia.    Alzheimer's disease Mother     Review of Systems - Oncology    PHYSICAL EXAMINATION   Onc Performance Status - 10/15/22 1016       ECOG Perf Status   ECOG Perf Status Capable of only limited selfcare, confined to bed or chair more than 50% of waking hours      KPS SCALE   KPS % SCORE Cares for self, unable to carry on normal activity or to do active work             Vitals:   10/15/22 1003  BP: 109/66  Pulse: 90  Resp: 18  Temp: 98.1 F (36.7 C)  SpO2: 96%    Physical Exam  LABORATORY DATA:  CBC    Component Value Date/Time   WBC 6.2 10/02/2022 1047   WBC 7.7 04/16/2020 0535   RBC 4.51 10/02/2022 1047   HGB 14.2 10/02/2022 1047   HCT 43.2 10/02/2022 1047   PLT 205 10/02/2022 1047   MCV 95.8 10/02/2022 1047   MCH 31.5 10/02/2022 1047   MCHC 32.9 10/02/2022 1047    RDW 14.0 10/02/2022 1047   LYMPHSABS 0.6 (L) 10/02/2022 1047   MONOABS 0.6 10/02/2022 1047   EOSABS 0.2 10/02/2022 1047   BASOSABS 0.0 10/02/2022 1047    CMP     Component Value Date/Time   NA 138 10/02/2022 1047   NA 142 09/01/2017 0000   K 4.4 10/02/2022 1047   CL 104 10/02/2022 1047   CO2 30 10/02/2022 1047   GLUCOSE 100 (H) 10/02/2022 1047   BUN 29 (H) 10/02/2022 1047   BUN 24 (A) 09/01/2017 0000   CREATININE 0.87 10/02/2022 1047   CREATININE 0.88 11/09/2019 1407   CALCIUM 9.4 10/02/2022 1047   PROT 7.0 10/02/2022 1047   ALBUMIN 4.0 10/02/2022 1047  AST 23 10/02/2022 1047   ALT 16 10/02/2022 1047   ALKPHOS 58 10/02/2022 1047   BILITOT 0.7 10/02/2022 1047   GFRNONAA >60 10/02/2022 1047   GFRNONAA 63 11/09/2019 1407   GFRAA 73 11/09/2019 1407         ASSESSMENT and THERAPY PLAN:   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 Now presenting with metastatic disease based on scans done 03/11/2020 CT CAP: Numerous lytic lesions in the thoracic spine several with expansion extraosseous extension. 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  Uncertain if any radiation was given to the skull lesions.   Current Treatment:   letrozole. Started December 2021, discontinued for progression and switched to Faslodex  Poor nutritional status: Since the tube feedings have started her weight is improving.  Energy levels are improving and she is got a better positive outlook.    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  No evidence of visceral metastatic disease. CT CAP 04/15/2021: No substantial interval change similar bone metastases CT CAP: 10/16/21: Enlarging Rt Subpectoral/ Axill LN,  stable bone mets. PET CT scan 01/10/2022: Hypermetabolic right subpectoral/axillary lymph node, stable bone metastases CT CAP 04/14/2022: Stable findings.  Right axillary and subpectoral lymph nodes are similar, bone metastases similar to before CT CAP 10/08/2022: Slight interval enlargement of the right subpectoral lymph node 6 cm (was 4.8 cm) stable bone metastases.  Discussion: Based on evidence of progression of the lymph node I recommend switching her from letrozole to Faslodex injections.  She will start this in a week.  PEG tube leakage: Will request radiology to adjust the PEG tube.  Return to clinic in 5 weeks for follow-up   No orders of the defined types were placed in this encounter.   All questions were answered. The patient knows to call the clinic with any problems, questions or concerns. We can certainly see the patient much sooner if necessary. This note was electronically signed. Tamsen Meek, MD 10/15/2022

## 2022-10-15 NOTE — Patient Instructions (Addendum)
Fulvestrant Injection What is this medication? FULVESTRANT (ful VES trant) treats breast cancer. It works by blocking the hormone estrogen in breast tissue, which prevents breast cancer cells from spreading or growing. This medicine may be used for other purposes; ask your health care provider or pharmacist if you have questions. COMMON BRAND NAME(S): FASLODEX What should I tell my care team before I take this medication? They need to know if you have any of these conditions: Bleeding disorder Liver disease Low blood cell levels, such as low white cells, red cells, and platelets An unusual or allergic reaction to fulvestrant, other medications, foods, dyes, or preservatives Pregnant or trying to get pregnant Breast-feeding How should I use this medication? This medication is injected into a muscle. It is given by your care team in a hospital or clinic setting. Talk to your care team about the use of this medication in children. Special care may be needed. Overdosage: If you think you have taken too much of this medicine contact a poison control center or emergency room at once. NOTE: This medicine is only for you. Do not share this medicine with others. What if I miss a dose? Keep appointments for follow-up doses. It is important not to miss your dose. Call your care team if you are unable to keep an appointment. What may interact with this medication? Certain medications that prevent or treat blood clots, such as warfarin, enoxaparin, dalteparin, apixaban, dabigatran, rivaroxaban This list may not describe all possible interactions. Give your health care provider a list of all the medicines, herbs, non-prescription drugs, or dietary supplements you use. Also tell them if you smoke, drink alcohol, or use illegal drugs. Some items may interact with your medicine. What should I watch for while using this medication? Your condition will be monitored carefully while you are receiving this  medication. You may need blood work while taking this medication. Talk to your care team if you may be pregnant. Serious birth defects can occur if you take this medication during pregnancy and for 1 year after the last dose. You will need a negative pregnancy test before starting this medication. Contraception is recommended while taking this medication and for 1 year after the last dose. Your care team can help you find the option that works for you. Do not breastfeed while taking this medication and for 1 year after the last dose. This medication may cause infertility. Talk to your care team if you are concerned about your fertility. What side effects may I notice from receiving this medication? Side effects that you should report to your care team as soon as possible: Allergic reactions or angioedema--skin rash, itching or hives, swelling of the face, eyes, lips, tongue, arms, or legs, trouble swallowing or breathing Pain, tingling, or numbness in the hands or feet Side effects that usually do not require medical attention (report to your care team if they continue or are bothersome): Bone, joint, or muscle pain Constipation Headache Hot flashes Nausea Pain, redness, or irritation at injection site Unusual weakness or fatigue This list may not describe all possible side effects. Call your doctor for medical advice about side effects. You may report side effects to FDA at 1-800-FDA-1088. Where should I keep my medication? This medication is given in a hospital or clinic. It will not be stored at home. NOTE: This sheet is a summary. It may not cover all possible information. If you have questions about this medicine, talk to your doctor, pharmacist, or health care provider.    2024 Elsevier/Gold Standard (2021-08-26 00:00:00) Denosumab Injection (Oncology) What is this medication? DENOSUMAB (den oh SUE mab) prevents weakened bones caused by cancer. It may also be used to treat noncancerous  bone tumors that cannot be removed by surgery. It can also be used to treat high calcium levels in the blood caused by cancer. It works by blocking a protein that causes bones to break down quickly. This slows down the release of calcium from bones, which lowers calcium levels in your blood. It also makes your bones stronger and less likely to break (fracture). This medicine may be used for other purposes; ask your health care provider or pharmacist if you have questions. COMMON BRAND NAME(S): XGEVA What should I tell my care team before I take this medication? They need to know if you have any of these conditions: Dental disease Having surgery or tooth extraction Infection Kidney disease Low levels of calcium or vitamin D in the blood Malnutrition On hemodialysis Skin conditions or sensitivity Thyroid or parathyroid disease An unusual reaction to denosumab, other medications, foods, dyes, or preservatives Pregnant or trying to get pregnant Breast-feeding How should I use this medication? This medication is for injection under the skin. It is given by your care team in a hospital or clinic setting. A special MedGuide will be given to you before each treatment. Be sure to read this information carefully each time. Talk to your care team about the use of this medication in children. While it may be prescribed for children as young as 13 years for selected conditions, precautions do apply. Overdosage: If you think you have taken too much of this medicine contact a poison control center or emergency room at once. NOTE: This medicine is only for you. Do not share this medicine with others. What if I miss a dose? Keep appointments for follow-up doses. It is important not to miss your dose. Call your care team if you are unable to keep an appointment. What may interact with this medication? Do not take this medication with any of the following: Other medications containing denosumab This  medication may also interact with the following: Medications that lower your chance of fighting infection Steroid medications, such as prednisone or cortisone This list may not describe all possible interactions. Give your health care provider a list of all the medicines, herbs, non-prescription drugs, or dietary supplements you use. Also tell them if you smoke, drink alcohol, or use illegal drugs. Some items may interact with your medicine. What should I watch for while using this medication? Your condition will be monitored carefully while you are receiving this medication. You may need blood work while taking this medication. This medication may increase your risk of getting an infection. Call your care team for advice if you get a fever, chills, sore throat, or other symptoms of a cold or flu. Do not treat yourself. Try to avoid being around people who are sick. You should make sure you get enough calcium and vitamin D while you are taking this medication, unless your care team tells you not to. Discuss the foods you eat and the vitamins you take with your care team. Some people who take this medication have severe bone, joint, or muscle pain. This medication may also increase your risk for jaw problems or a broken thigh bone. Tell your care team right away if you have severe pain in your jaw, bones, joints, or muscles. Tell your care team if you have any pain that does not go  away or that gets worse. Talk to your care team if you may be pregnant. Serious birth defects can occur if you take this medication during pregnancy and for 5 months after the last dose. You will need a negative pregnancy test before starting this medication. Contraception is recommended while taking this medication and for 5 months after the last dose. Your care team can help you find the option that works for you. What side effects may I notice from receiving this medication? Side effects that you should report to your care  team as soon as possible: Allergic reactions--skin rash, itching, hives, swelling of the face, lips, tongue, or throat Bone, joint, or muscle pain Low calcium level--muscle pain or cramps, confusion, tingling, or numbness in the hands or feet Osteonecrosis of the jaw--pain, swelling, or redness in the mouth, numbness of the jaw, poor healing after dental work, unusual discharge from the mouth, visible bones in the mouth Side effects that usually do not require medical attention (report to your care team if they continue or are bothersome): Cough Diarrhea Fatigue Headache Nausea This list may not describe all possible side effects. Call your doctor for medical advice about side effects. You may report side effects to FDA at 1-800-FDA-1088. Where should I keep my medication? This medication is given in a hospital or clinic. It will not be stored at home. NOTE: This sheet is a summary. It may not cover all possible information. If you have questions about this medicine, talk to your doctor, pharmacist, or health care provider.  2024 Elsevier/Gold Standard (2021-09-03 00:00:00)

## 2022-10-15 NOTE — Assessment & Plan Note (Signed)
Originally diagnosed in 1995, patient did not take prescribed tamoxifen. Now presenting with metastatic disease based on scans done 03/11/2020 CT CAP: Numerous lytic lesions in the thoracic spine several with expansion extraosseous extension. 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  Uncertain if any radiation was given to the skull lesions.   Current Treatment:   letrozole. Started December 2021, discontinued for progression and switched to Faslodex  Poor nutritional status: Since the tube feedings have started her weight is improving.  Energy levels are improving and she is got a better positive outlook.    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  No evidence of visceral metastatic disease. CT CAP 04/15/2021: No substantial interval change similar bone metastases CT CAP: 10/16/21: Enlarging Rt Subpectoral/ Axill LN, stable bone mets. PET CT scan 01/10/2022: Hypermetabolic right subpectoral/axillary lymph node, stable bone metastases CT CAP 04/14/2022: Stable findings.  Right axillary and subpectoral lymph nodes are similar, bone metastases similar to before CT CAP 10/08/2022: Slight interval enlargement of the right subpectoral lymph node 6 cm (was 4.8 cm) stable bone metastases.  Discussion: Based on evidence of progression of the lymph node I recommend switching her from letrozole to Faslodex injections.  She will start this in a week.  PEG tube leakage: Will request radiology to adjust the PEG tube.  Return to clinic in 5 weeks for follow-up

## 2022-10-15 NOTE — Progress Notes (Signed)
Per MD request RN placed IR eval for leakage at peg tube site.

## 2022-10-19 ENCOUNTER — Other Ambulatory Visit (HOSPITAL_COMMUNITY): Payer: Medicare Other

## 2022-10-22 ENCOUNTER — Inpatient Hospital Stay: Payer: Medicare Other

## 2022-10-26 ENCOUNTER — Telehealth: Payer: Self-pay

## 2022-10-26 NOTE — Telephone Encounter (Signed)
Returned Pt's call regarding injection appts. Pt states her inj appts are "all messed up" because instead of having faslodex start the week of 10/19/22 as Dr. Pamelia Hoit requested, faslodex was given alongside xgeva on 10/15/22. Called and sent subsequent inbasket message to scheduler to reschedule injection appts based on 10/15/22 start date. Pt verbalized understanding.

## 2022-10-27 ENCOUNTER — Telehealth: Payer: Self-pay | Admitting: Hematology and Oncology

## 2022-10-27 NOTE — Telephone Encounter (Signed)
Scheduled appointment per staff message. Patient is aware of the made appointment. 

## 2022-10-30 ENCOUNTER — Other Ambulatory Visit (HOSPITAL_COMMUNITY): Payer: Medicare Other

## 2022-11-02 ENCOUNTER — Inpatient Hospital Stay: Payer: Medicare Other | Attending: Hematology and Oncology

## 2022-11-02 VITALS — BP 112/66 | HR 82 | Temp 97.8°F | Resp 18

## 2022-11-02 DIAGNOSIS — Z79811 Long term (current) use of aromatase inhibitors: Secondary | ICD-10-CM | POA: Diagnosis not present

## 2022-11-02 DIAGNOSIS — C773 Secondary and unspecified malignant neoplasm of axilla and upper limb lymph nodes: Secondary | ICD-10-CM | POA: Insufficient documentation

## 2022-11-02 DIAGNOSIS — C7951 Secondary malignant neoplasm of bone: Secondary | ICD-10-CM | POA: Diagnosis present

## 2022-11-02 DIAGNOSIS — C50911 Malignant neoplasm of unspecified site of right female breast: Secondary | ICD-10-CM | POA: Insufficient documentation

## 2022-11-02 DIAGNOSIS — Z170 Estrogen receptor positive status [ER+]: Secondary | ICD-10-CM | POA: Insufficient documentation

## 2022-11-02 MED ORDER — FULVESTRANT 250 MG/5ML IM SOSY
500.0000 mg | PREFILLED_SYRINGE | Freq: Once | INTRAMUSCULAR | Status: AC
  Administered 2022-11-02: 500 mg via INTRAMUSCULAR
  Filled 2022-11-02: qty 10

## 2022-11-05 ENCOUNTER — Inpatient Hospital Stay: Payer: Medicare Other

## 2022-11-10 ENCOUNTER — Other Ambulatory Visit: Payer: Self-pay | Admitting: Hematology and Oncology

## 2022-11-10 ENCOUNTER — Other Ambulatory Visit (HOSPITAL_COMMUNITY): Payer: Self-pay | Admitting: Interventional Radiology

## 2022-11-10 ENCOUNTER — Ambulatory Visit (HOSPITAL_COMMUNITY)
Admission: RE | Admit: 2022-11-10 | Discharge: 2022-11-10 | Disposition: A | Discharge status: Home / Self Care | Payer: Medicare Other | Source: Ambulatory Visit | Attending: Hematology and Oncology | Admitting: Hematology and Oncology

## 2022-11-10 DIAGNOSIS — Z431 Encounter for attention to gastrostomy: Secondary | ICD-10-CM

## 2022-11-10 HISTORY — PX: IR RADIOLOGIST EVAL & MGMT: IMG5224

## 2022-11-10 MED ORDER — LIDOCAINE VISCOUS HCL 2 % MT SOLN
OROMUCOSAL | Status: AC
  Filled 2022-11-10: qty 15

## 2022-11-10 NOTE — Progress Notes (Signed)
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  The pull through type g tube was placed by Dr. Grace Isaac on 04/15/20 and has remained in since.  She has had external adapter repairs to the tube in the past.  She presents with occasional leakage from tube, not related to feeding.  She feeds daily.  She complains of some discoloration of the external portion of the tube.  The tube has been functioning well otherwise.  Past Medical History:  Diagnosis Date   Breast cancer [facility])     Past Surgical History:  Procedure Laterality Date   BREAST BIOPSY     IR GASTROSTOMY TUBE MOD SED  04/15/2020   IR PATIENT EVAL TECH 0-60 MINS  08/22/2020   IR PATIENT EVAL TECH 0-60 MINS  08/29/2021   IR PATIENT EVAL TECH 0-60 MINS  09/24/2021   MASTECTOMY     MASTECTOMY W/ SENTINEL NODE BIOPSY     Right. 18 years ago.      Allergies: Atorvastatin, Bactrim [sulfamethoxazole-trimethoprim], Clavulanic acid, Guaifenesin & derivatives, and Tamoxifen  Medications: Prior to Admission medications   Medication Sig Start Date End Date Taking? Authorizing Provider  acetaminophen (TYLENOL) 160 MG/5ML liquid Take 500 mg by mouth every 4 (four) hours as needed for fever.    [provider]  LORazepam (ATIVAN) 0.5 MG tablet Take 1 tablet (0.5 mg total) by mouth every 8 (eight) hours. Take one tablet by mouth 1 hour prior to scan.  May repeat if needed. Patient not taking: Reported on 10/15/2022 09/30/22   Serena Croissant, MD     Family History  Problem Relation Age of Onset   Heart disease Mother    Anemia Mother        penicous anemia.    Alzheimer's disease Mother     Social History   Socioeconomic History   Marital status: Single    Spouse name: Not on file   Number of children: Not on file   Years of education: Not on file   Highest education level:  Not on file  Occupational History   Not on file  Tobacco Use   Smoking status: Never   Smokeless tobacco: Never  Substance and Sexual Activity   Alcohol use: Never    Comment: [place].     Drug use: Never   Sexual activity: Not Currently  Other Topics Concern   Not on file  Social History Narrative   Lives in [place] but stays with her daughter 1-2 weeks out of the month.     Social Determinants of Health   Financial Resource Strain: Not on file  Food Insecurity: Not on file  Transportation Needs: Not on file  Physical Activity: Not on file  Stress: Not on file  Social Connections: Not on file     Vital Signs: There were no vitals taken for this visit.  Physical Exam Constitutional:      General: She is not in acute distress. HENT:     Head: Normocephalic.     Mouth/Throat:     Mouth: Mucous membranes are moist.  Eyes:     General: No scleral icterus. Cardiovascular:     Rate and Rhythm: Normal rate and regular rhythm.  Pulmonary:     Effort: Pulmonary effort is normal.  Abdominal:     General: There is no distension.     Palpations: Abdomen is  soft.     Comments: LUQ G tube in place, some yellowish discoloration to tube, minimal leakage at skin entry site.  No surrounding erythema.  Musculoskeletal:     Right lower leg: No edema.     Left lower leg: No edema.  Skin:    General: Skin is warm.     Coloration: Skin is not jaundiced.  Neurological:     Mental Status: She is alert and oriented to person, place, and time.     Imaging: No results found.  Labs:  CBC: Recent Labs    01/12/22 1410 04/10/22 1349 07/21/22 1249 10/02/22 1047  WBC 5.7 5.1 6.2 6.2  HGB 15.1* 14.7 14.8 14.2  HCT 45.1 43.3 44.8 43.2  PLT 200 197 209 205    COAGS: No results for input(s): "INR", "APTT" in the last 8760 hours.  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  The pull through type g tube was placed by Dr. Grace Isaac on 04/15/20 and has remained in since.  Due to intermittent leakage which is bothering the patient, we will arrange for gastrostomy exchange.  She is not set up at home with the novel enfit adaptors.  Therefore, we will let her and her care taker order these, and once received, let us know so we can schedule for gastrostomy exchange.  She has anxiety and prefers sedation for tube exchange which I believe is reasonable.  Electronically Signed: Bennie Dallas 11/10/2022, 4:29 PM   I spent a total of 25 Minutes in face to face in clinical consultation, greater than 50% of which was counseling/coordinating care for gastrostomy tube.

## 2022-11-18 ENCOUNTER — Telehealth: Payer: Medicare Other | Admitting: Family Medicine

## 2022-11-18 NOTE — Progress Notes (Signed)
Patient Care Team: Agapito Games, MD as PCP - General (Family Medicine) Serena Croissant, MD as Consulting Physician (Hematology and Oncology)  DIAGNOSIS: No diagnosis found.  SUMMARY OF ONCOLOGIC HISTORY: Oncology History  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)  1995 Initial Biopsy   Right breast cancer stage IIb ER positive IDC s/p mastectomy and 2 months of tamoxifen, lost to follow-up (treatment at [facility])   03/12/2020 - 03/26/2020 Radiation Therapy   Palliative radiation to the brain and spine for brain metastases   03/14/2020 Initial Diagnosis   Scans on 03/14/2020: Multiple bone metastases and brain metastases, biopsy of the rib: Breast cancer ER 80% PR negative HER-2 negative.  Moved to [facility] December 2021 to be closer to her family   02/2020 -  Anti-estrogen oral therapy   Anastrozole daily   10/21/2020 Treatment Plan Change   Xgeva every 3 months   10/06/2022 Imaging   IMPRESSION: 1. Slight interval enlargement of bulky right subpectoral lymph nodes, consistent with worsened nodal metastatic disease. 2. Multiple unchanged mixed lytic and sclerotic osseous metastatic lesions, notable for a large, expansile lesion of the right second rib as well as numerous additional expansile rib lesions, thoracolumbar vertebral body lesions, and pelvic lesions. 3. No other evidence of metastatic disease in the chest, abdomen, or pelvis. 4. Coronary artery disease. 5. Aortic valve calcifications. Correlate for echocardiographic evidence of aortic valve dysfunction.   Aortic Atherosclerosis (ICD10-I70.0).     Electronically Signed   By: Jearld Lesch M.D.   On: 10/08/2022 07:16   10/15/2022 Cancer Staging   Staging form: Breast, AJCC 8th Edition - Clinical: Stage IV (pM1) - Signed by Loa Socks, NP on 10/15/2022     CHIEF COMPLIANT:   INTERVAL HISTORY: Jodi Kaufman is a   ALLERGIES:  is allergic to atorvastatin,  bactrim [sulfamethoxazole-trimethoprim], clavulanic acid, guaifenesin & derivatives, and tamoxifen.  MEDICATIONS:  Current Outpatient Medications  Medication Sig Dispense Refill   acetaminophen (TYLENOL) 160 MG/5ML liquid Take 500 mg by mouth every 4 (four) hours as needed for fever.     LORazepam (ATIVAN) 0.5 MG tablet Take 1 tablet (0.5 mg total) by mouth every 8 (eight) hours. Take one tablet by mouth 1 hour prior to scan.  May repeat if needed. (Patient not taking: Reported on 10/15/2022) 2 tablet 0   No current facility-administered medications for this visit.    PHYSICAL EXAMINATION: ECOG PERFORMANCE STATUS: {CHL ONC ECOG [phone]}  There were no vitals filed for this visit. There were no vitals filed for this visit.  BREAST:*** No palpable masses or nodules in either right or left breasts. No palpable axillary supraclavicular or infraclavicular adenopathy no breast tenderness or nipple discharge. (exam performed in the presence of a chaperone)  LABORATORY DATA:  I have reviewed the data as listed    Latest Ref Rng & Units 10/02/2022   10:47 AM 07/21/2022   12:49 PM 04/10/2022    1:49 PM  CMP  Glucose 70 - 99 mg/dL 621  89  97   BUN 8 - 23 mg/dL 29  30  26    Creatinine 0.44 - 1.00 mg/dL 3.08  6.57  8.46   Sodium 135 - 145 mmol/L 138  138  139   Potassium 3.5 - 5.1 mmol/L 4.4  4.6  4.4   Chloride 98 - 111 mmol/L 104  103  104   CO2 22 - 32 mmol/L 30  30  31    Calcium 8.9 - 10.3 mg/dL  9.4  9.4  9.7   Total Protein 6.5 - 8.1 g/dL 7.0  6.8  6.7   Total Bilirubin 0.3 - 1.2 mg/dL 0.7  0.6  0.7   Alkaline Phos 38 - 126 U/L 58  54  59   AST 15 - 41 U/L 23  23  22    ALT 0 - 44 U/L 16  14  15      Lab Results  Component Value Date   WBC 6.2 10/02/2022   HGB 14.2 10/02/2022   HCT 43.2 10/02/2022   MCV 95.8 10/02/2022   PLT 205 10/02/2022   NEUTROABS 4.6 10/02/2022    ASSESSMENT & PLAN:  No problem-specific Assessment & Plan notes found for this encounter.    No  orders of the defined types were placed in this encounter.  The patient has a good understanding of the overall plan. she agrees with it. she will call with any problems that may develop before the next visit here. Total time spent: 30 mins including face to face time and time spent for planning, charting and co-ordination of care   Sherlyn Lick, CMA 11/18/22    I Janan Ridge am acting as a Neurosurgeon for [employer]  ***

## 2022-11-19 ENCOUNTER — Inpatient Hospital Stay (HOSPITAL_BASED_OUTPATIENT_CLINIC_OR_DEPARTMENT_OTHER): Payer: Medicare Other | Admitting: Hematology and Oncology

## 2022-11-19 ENCOUNTER — Inpatient Hospital Stay: Payer: Medicare Other

## 2022-11-19 ENCOUNTER — Other Ambulatory Visit: Payer: Self-pay

## 2022-11-19 VITALS — BP 119/74 | HR 92 | Temp 97.8°F | Resp 18 | Ht 66.0 in | Wt 142.5 lb

## 2022-11-19 DIAGNOSIS — N6489 Other specified disorders of breast: Secondary | ICD-10-CM | POA: Diagnosis not present

## 2022-11-19 DIAGNOSIS — C50911 Malignant neoplasm of unspecified site of right female breast: Secondary | ICD-10-CM | POA: Diagnosis not present

## 2022-11-19 DIAGNOSIS — C773 Secondary and unspecified malignant neoplasm of axilla and upper limb lymph nodes: Secondary | ICD-10-CM

## 2022-11-19 DIAGNOSIS — C7951 Secondary malignant neoplasm of bone: Secondary | ICD-10-CM

## 2022-11-19 DIAGNOSIS — C50919 Malignant neoplasm of unspecified site of unspecified female breast: Secondary | ICD-10-CM

## 2022-11-19 MED ORDER — FULVESTRANT 250 MG/5ML IM SOSY
500.0000 mg | PREFILLED_SYRINGE | Freq: Once | INTRAMUSCULAR | Status: AC
  Administered 2022-11-19: 500 mg via INTRAMUSCULAR
  Filled 2022-11-19: qty 10

## 2022-11-19 NOTE — Assessment & Plan Note (Addendum)
Originally diagnosed in 1995, patient did not take prescribed tamoxifen. Now presenting with metastatic disease based on scans done 03/11/2020 CT CAP: Numerous lytic lesions in the thoracic spine several with expansion extraosseous extension. 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  Uncertain if any radiation was given to the skull lesions.   Current Treatment:   letrozole. Started December 2021, discontinued for progression and switched to Faslodex 10/15/2022   Poor nutritional status: Since the tube feedings have started her weight is improving.  Energy levels are improving and she is got a better positive outlook.    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  No evidence of visceral metastatic disease. CT CAP 04/15/2021: No substantial interval change similar bone metastases CT CAP: 10/16/21: Enlarging Rt Subpectoral/ Axill LN, stable bone mets. PET CT scan 01/10/2022: Hypermetabolic right subpectoral/axillary lymph node, stable bone metastases CT CAP 04/14/2022: Stable findings.  Right axillary and subpectoral lymph nodes are similar, bone metastases similar to before CT CAP 10/08/2022: Slight interval enlargement of the right subpectoral lymph node 6 cm (was 4.8 cm) stable bone metastases.   Faslodex toxicities: Fatigue  PEG tube leakage: Will request radiology to adjust the PEG tube.   She will come monthly for injections and in 2 months she will have CT scans done ahead of time and follow-up to discuss results.

## 2022-11-26 ENCOUNTER — Telehealth: Payer: Medicare Other | Admitting: Medical-Surgical

## 2022-11-26 ENCOUNTER — Encounter: Payer: Self-pay | Admitting: Medical-Surgical

## 2022-11-26 DIAGNOSIS — R1310 Dysphagia, unspecified: Secondary | ICD-10-CM | POA: Insufficient documentation

## 2022-11-26 DIAGNOSIS — B9689 Other specified bacterial agents as the cause of diseases classified elsewhere: Secondary | ICD-10-CM

## 2022-11-26 DIAGNOSIS — Z860100 Personal history of colon polyps, unspecified: Secondary | ICD-10-CM | POA: Insufficient documentation

## 2022-11-26 DIAGNOSIS — G893 Neoplasm related pain (acute) (chronic): Secondary | ICD-10-CM | POA: Insufficient documentation

## 2022-11-26 DIAGNOSIS — C50111 Malignant neoplasm of central portion of right female breast: Secondary | ICD-10-CM | POA: Insufficient documentation

## 2022-11-26 DIAGNOSIS — R410 Disorientation, unspecified: Secondary | ICD-10-CM | POA: Insufficient documentation

## 2022-11-26 DIAGNOSIS — M792 Neuralgia and neuritis, unspecified: Secondary | ICD-10-CM | POA: Insufficient documentation

## 2022-11-26 DIAGNOSIS — J019 Acute sinusitis, unspecified: Secondary | ICD-10-CM | POA: Diagnosis not present

## 2022-11-26 DIAGNOSIS — Z515 Encounter for palliative care: Secondary | ICD-10-CM | POA: Insufficient documentation

## 2022-11-26 DIAGNOSIS — Z800 Family history of malignant neoplasm of digestive organs: Secondary | ICD-10-CM | POA: Insufficient documentation

## 2022-11-26 MED ORDER — CEFDINIR 300 MG PO CAPS: 300.0000 mg | ORAL_CAPSULE | Freq: Two times a day (BID) | ORAL | 0 refills | Status: DC

## 2022-11-26 NOTE — Progress Notes (Signed)
Virtual Visit via Telephone   I connected with  Jodi Kaufman  on 11/26/22 by telephone/telehealth and verified that I am speaking with the correct person using two identifiers.   I discussed the limitations, risks, security and privacy concerns of performing an evaluation and management service by telephone, including the higher likelihood of inaccurate diagnosis and treatment, and the availability of in person appointments.  We also discussed the likely need of an additional face to face encounter for complete and high quality delivery of care.  I also discussed with the patient that there may be a patient responsible charge related to this service. The patient expressed understanding and wishes to proceed.  Provider location is in medical facility. Patient location is at their home, different from provider location. People involved in care of the patient during this telehealth encounter were myself, my nurse/medical assistant, and my front office/scheduling team member.  CC: Sinus symptoms  HPI: Very pleasant 82 year old female presenting via telephone after multiple attempts to connect via video visit without success.  Notes that for the last 4-6 weeks, she has had significant issues with sinus congestion, postnasal drip, low-grade temperature of approximately 99, intermittent chills, facial pain, and coughing productive of green-yellow mucus.  Efforts to control symptoms at home have been unsuccessful.  Notes that she has difficulty swallowing and frequent throat clearing.  Has a PEG tube that she puts her medications in and will need something that comes in a liquid or a capsule that can be opened.  Review of Systems: See HPI for pertinent positives and negatives.   Objective Findings:    General: Speaking full sentences, no audible heavy breathing.  Sounds alert and appropriately interactive.    Impression and Recommendations:    1. Acute bacterial sinusitis With longevity of  symptoms, treating with cefdinir 300 mg twice daily for 7 days.  Discussed possible addition of a steroid burst but she would like to hold off on this for now.  Advised to increase her fluids and consider using a saline nasal spray to help thin secretions.  If symptoms fail to fully resolve, requested she reach back out to [place] for further instructions.  I discussed the above assessment and treatment plan with the patient. The patient was provided an opportunity to ask questions and all were answered. The patient agreed with the plan and demonstrated an understanding of the instructions.   The patient was advised to call back or seek an in-person evaluation if the symptoms worsen or if the condition fails to improve as anticipated.  25 minutes of non-face-to-face time was provided during this encounter.  Return if symptoms worsen or fail to improve. ___________________________________________ Jodi Butter, DNP, APRN, FNP-BC [facility] [address]

## 2022-12-02 ENCOUNTER — Other Ambulatory Visit: Payer: Self-pay | Admitting: Medical-Surgical

## 2022-12-02 ENCOUNTER — Telehealth: Payer: Self-pay | Admitting: Medical-Surgical

## 2022-12-02 NOTE — Telephone Encounter (Signed)
Patient called and requested about 3 days of Cefdinir 300mg  capsule Pharmacy [facility]  Phone [phone]

## 2022-12-03 NOTE — Telephone Encounter (Signed)
Sent to pharmacy 11/26/2022

## 2022-12-04 ENCOUNTER — Telehealth: Payer: Self-pay | Admitting: Family Medicine

## 2022-12-04 ENCOUNTER — Telehealth: Payer: Self-pay

## 2022-12-04 ENCOUNTER — Other Ambulatory Visit: Payer: Self-pay | Admitting: Hematology and Oncology

## 2022-12-04 NOTE — Telephone Encounter (Signed)
Pt returned call, see previous phone note. Pt states she does not know why pharmacy requested anastrozole rx refill. Refill denied. Pt verbalized understanding.

## 2022-12-04 NOTE — Telephone Encounter (Signed)
Called Pt where VM box was full. Called and LVM with daughter, Chyrl Civatte, asking for call back. Called and spoke with daughter, Victorino Dike, regarding anastrozole rx refill request. MD D/Cd rx on 10/15/22. Asked for Pt to call us back to discuss. Victorino Dike verbalized she will relay message to Pt.

## 2022-12-04 NOTE — Telephone Encounter (Signed)
Pt called. She states she needs 3 extra days of the Cefdinir.

## 2022-12-07 NOTE — Telephone Encounter (Signed)
Pt was seen by Joy. Was advised to contact her about this.

## 2022-12-16 ENCOUNTER — Other Ambulatory Visit: Payer: Self-pay

## 2022-12-16 DIAGNOSIS — C7951 Secondary malignant neoplasm of bone: Secondary | ICD-10-CM

## 2022-12-17 ENCOUNTER — Inpatient Hospital Stay: Payer: Medicare Other

## 2022-12-17 ENCOUNTER — Inpatient Hospital Stay: Payer: Medicare Other | Attending: Hematology and Oncology

## 2022-12-17 ENCOUNTER — Other Ambulatory Visit: Payer: Medicare Other

## 2022-12-17 VITALS — BP 118/66 | HR 83 | Resp 18

## 2022-12-17 DIAGNOSIS — Z170 Estrogen receptor positive status [ER+]: Secondary | ICD-10-CM | POA: Insufficient documentation

## 2022-12-17 DIAGNOSIS — C7951 Secondary malignant neoplasm of bone: Secondary | ICD-10-CM

## 2022-12-17 DIAGNOSIS — C50911 Malignant neoplasm of unspecified site of right female breast: Secondary | ICD-10-CM | POA: Insufficient documentation

## 2022-12-17 DIAGNOSIS — Z5111 Encounter for antineoplastic chemotherapy: Secondary | ICD-10-CM | POA: Insufficient documentation

## 2022-12-17 LAB — CMP (CANCER CENTER ONLY)
ALT: 18 U/L (ref 0–44)
AST: 26 U/L (ref 15–41)
Albumin: 4 g/dL (ref 3.5–5.0)
Alkaline Phosphatase: 56 U/L (ref 38–126)
Anion gap: 5 (ref 5–15)
BUN: 30 mg/dL — ABNORMAL HIGH (ref 8–23)
CO2: 31 mmol/L (ref 22–32)
Calcium: 9.5 mg/dL (ref 8.9–10.3)
Chloride: 103 mmol/L (ref 98–111)
Creatinine: 0.87 mg/dL (ref 0.44–1.00)
GFR, Estimated: >60 mL/min (ref >60)
Glucose, Bld: 99 mg/dL (ref 70–99)
Potassium: 4.9 mmol/L (ref 3.5–5.1)
Sodium: 139 mmol/L (ref 135–145)
Total Bilirubin: 0.6 mg/dL (ref 0.3–1.2)
Total Protein: 6.9 g/dL (ref 6.5–8.1)

## 2022-12-17 LAB — CBC WITH DIFFERENTIAL (CANCER CENTER ONLY)
Abs Immature Granulocytes: 0.01 10*3/uL (ref 0.00–0.07)
Basophils Absolute: 0 10*3/uL (ref 0.0–0.1)
Basophils Relative: 1 %
Eosinophils Absolute: 0.2 10*3/uL (ref 0.0–0.5)
Eosinophils Relative: 4 %
HCT: 44 % (ref 36.0–46.0)
Hemoglobin: 14.4 g/dL (ref 12.0–15.0)
Immature Granulocytes: 0 %
Lymphocytes Relative: 15 %
Lymphs Abs: 0.7 10*3/uL (ref 0.7–4.0)
MCH: 31.6 pg (ref 26.0–34.0)
MCHC: 32.7 g/dL (ref 30.0–36.0)
MCV: 96.5 fL (ref 80.0–100.0)
Monocytes Absolute: 0.6 10*3/uL (ref 0.1–1.0)
Monocytes Relative: 12 %
Neutro Abs: 3.3 10*3/uL (ref 1.7–7.7)
Neutrophils Relative %: 68 %
Platelet Count: 191 10*3/uL (ref 150–400)
RBC: 4.56 MIL/uL (ref 3.87–5.11)
RDW: 14.1 % (ref 11.5–15.5)
WBC Count: 4.8 10*3/uL (ref 4.0–10.5)
nRBC: 0 % (ref 0.0–0.2)

## 2022-12-17 MED ORDER — FULVESTRANT 250 MG/5ML IM SOSY
500.0000 mg | PREFILLED_SYRINGE | Freq: Once | INTRAMUSCULAR | Status: AC
  Administered 2022-12-17: 500 mg via INTRAMUSCULAR
  Filled 2022-12-17: qty 10

## 2022-12-25 ENCOUNTER — Telehealth: Payer: Self-pay

## 2022-12-25 NOTE — Telephone Encounter (Signed)
Pt called with concern for her CT not including PO contrast. She does not meet expectations per radiology protocol. She was given this information and states she feels she is not being cared for properly. Spoke with the CT director at [facility] and she can have PO contrast given ca hx and family hx. Pt has a PEG tube and does not take anything PO. To preserve dignity, the pt should be allowed to pick up contrast from radiology to self administer at home prior to her scan. She will come to [facility] to pick up contrast.

## 2022-12-29 ENCOUNTER — Other Ambulatory Visit: Payer: Self-pay | Admitting: Hematology and Oncology

## 2022-12-30 ENCOUNTER — Ambulatory Visit (HOSPITAL_COMMUNITY)
Admission: RE | Admit: 2022-12-30 | Discharge: 2022-12-30 | Disposition: A | Discharge status: Home / Self Care | Payer: Medicare Other | Source: Ambulatory Visit | Attending: Hematology and Oncology | Admitting: Hematology and Oncology

## 2022-12-30 DIAGNOSIS — C50919 Malignant neoplasm of unspecified site of unspecified female breast: Secondary | ICD-10-CM | POA: Insufficient documentation

## 2022-12-30 DIAGNOSIS — C773 Secondary and unspecified malignant neoplasm of axilla and upper limb lymph nodes: Secondary | ICD-10-CM | POA: Diagnosis present

## 2022-12-30 DIAGNOSIS — N6489 Other specified disorders of breast: Secondary | ICD-10-CM | POA: Insufficient documentation

## 2022-12-30 MED ORDER — SODIUM CHLORIDE (PF) 0.9 % IJ SOLN
INTRAMUSCULAR | Status: AC
  Filled 2022-12-30: qty 50

## 2022-12-30 MED ORDER — IOHEXOL 300 MG/ML  SOLN
100.0000 mL | Freq: Once | INTRAMUSCULAR | Status: AC | PRN
  Administered 2022-12-30: 100 mL via INTRAVENOUS

## 2023-01-07 ENCOUNTER — Encounter: Payer: Medicare Other | Admitting: Family Medicine

## 2023-01-12 ENCOUNTER — Ambulatory Visit: Payer: Medicare Other | Admitting: Hematology and Oncology

## 2023-01-14 ENCOUNTER — Inpatient Hospital Stay (HOSPITAL_BASED_OUTPATIENT_CLINIC_OR_DEPARTMENT_OTHER): Payer: Medicare Other | Admitting: Hematology and Oncology

## 2023-01-14 ENCOUNTER — Inpatient Hospital Stay: Payer: Medicare Other | Attending: Hematology and Oncology

## 2023-01-14 VITALS — BP 107/56 | HR 86 | Temp 97.3°F | Resp 18 | Ht 66.0 in | Wt 140.9 lb

## 2023-01-14 DIAGNOSIS — C7931 Secondary malignant neoplasm of brain: Secondary | ICD-10-CM | POA: Insufficient documentation

## 2023-01-14 DIAGNOSIS — Z7983 Long term (current) use of bisphosphonates: Secondary | ICD-10-CM | POA: Diagnosis not present

## 2023-01-14 DIAGNOSIS — Z853 Personal history of malignant neoplasm of breast: Secondary | ICD-10-CM | POA: Diagnosis present

## 2023-01-14 DIAGNOSIS — C773 Secondary and unspecified malignant neoplasm of axilla and upper limb lymph nodes: Secondary | ICD-10-CM | POA: Insufficient documentation

## 2023-01-14 DIAGNOSIS — Z9011 Acquired absence of right breast and nipple: Secondary | ICD-10-CM | POA: Insufficient documentation

## 2023-01-14 DIAGNOSIS — Z79818 Long term (current) use of other agents affecting estrogen receptors and estrogen levels: Secondary | ICD-10-CM | POA: Diagnosis not present

## 2023-01-14 DIAGNOSIS — C7951 Secondary malignant neoplasm of bone: Secondary | ICD-10-CM | POA: Diagnosis present

## 2023-01-14 DIAGNOSIS — C50919 Malignant neoplasm of unspecified site of unspecified female breast: Secondary | ICD-10-CM | POA: Diagnosis not present

## 2023-01-14 DIAGNOSIS — Z923 Personal history of irradiation: Secondary | ICD-10-CM | POA: Diagnosis not present

## 2023-01-14 MED ORDER — DENOSUMAB 120 MG/1.7ML ~~LOC~~ SOLN
120.0000 mg | Freq: Once | SUBCUTANEOUS | Status: AC
  Administered 2023-01-14: 120 mg via SUBCUTANEOUS
  Filled 2023-01-14: qty 1.7

## 2023-01-14 MED ORDER — FULVESTRANT 250 MG/5ML IM SOSY
500.0000 mg | PREFILLED_SYRINGE | Freq: Once | INTRAMUSCULAR | Status: AC
  Administered 2023-01-14: 500 mg via INTRAMUSCULAR
  Filled 2023-01-14: qty 10

## 2023-01-14 NOTE — Assessment & Plan Note (Signed)
Originally diagnosed in 1995, patient did not take prescribed tamoxifen. Now presenting with metastatic disease based on scans done 03/11/2020 CT CAP: Numerous lytic lesions in the thoracic spine several with expansion extraosseous extension. 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  Uncertain if any radiation was given to the skull lesions.   Current Treatment:   letrozole. Started December 2021, discontinued for progression and switched to Faslodex 10/15/2022   Poor nutritional status: Since the tube feedings have started her weight is improving.  Energy levels are improving and she is got a better positive outlook.    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  No evidence of visceral metastatic disease. CT CAP 04/15/2021: No substantial interval change similar bone metastases CT CAP: 10/16/21: Enlarging Rt Subpectoral/ Axill LN, stable bone mets. PET CT scan 01/10/2022: Hypermetabolic right subpectoral/axillary lymph node, stable bone metastases CT CAP 04/14/2022: Stable findings.  Right axillary and subpectoral lymph nodes are similar, bone metastases similar to before CT CAP 10/08/2022: Slight interval enlargement of the right subpectoral lymph node 6 cm (was 4.8 cm) stable bone metastases. CT CAP 12/30/2022: No significant interval change in the bone metastases.  Stable bulky subpectoral lymph nodes.   Faslodex toxicities: Fatigue Continue with monthly Faslodex I will see the patient in 3 months

## 2023-01-15 ENCOUNTER — Encounter: Payer: Self-pay | Admitting: Hematology and Oncology

## 2023-01-15 NOTE — Progress Notes (Signed)
Patient Care Team: Agapito Games, MD as PCP - General (Family Medicine) Serena Croissant, MD as Consulting Physician (Hematology and Oncology)  DIAGNOSIS:  Encounter Diagnosis  Name Primary?   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  Moved to [facility] December 2021 to be closer to her family   02/2020 -  Anti-estrogen oral therapy   Anastrozole daily   10/21/2020 Treatment Plan Change   Xgeva every 3 months   10/06/2022 Imaging   IMPRESSION: 1. Slight interval enlargement of bulky right subpectoral lymph nodes, consistent with worsened nodal metastatic disease. 2. Multiple unchanged mixed lytic and sclerotic osseous metastatic lesions, notable for a large, expansile lesion of the right second rib as well as numerous additional expansile rib lesions, thoracolumbar vertebral body lesions, and pelvic lesions. 3. No other evidence of metastatic disease in the chest, abdomen, or pelvis. 4. Coronary artery disease. 5. Aortic valve calcifications. Correlate for echocardiographic evidence of aortic valve dysfunction.   Aortic Atherosclerosis (ICD10-I70.0).     Electronically Signed   By: Jearld Lesch M.D.   On: 10/08/2022 07:16   10/15/2022 Cancer Staging   Staging form: Breast, AJCC 8th Edition - Clinical: Stage IV (pM1) - Signed by Loa Socks, NP on 10/15/2022     CHIEF COMPLIANT:    Discussed the use of AI scribe software for clinical note transcription with the patient, who gave verbal consent to proceed.  History of Present Illness   The patient, with a history of cancer, presents for a routine follow-up visit. She expresses concern about a recent weight loss of two pounds, although it is unclear if this is a trend or a one-time occurrence. The patient also reports increased phlegm production after tube feeding, which she believes may be due to a change in the preparation of their nutritional supplement, Jevedi. She also mentions occasional body aches, which are managed with Tylenol.  Additionally, the patient has noticed a skin lesion under her left eye, which has been present for approximately three years. The lesion occasionally bleeds when rubbed. The patient has been advised to see a dermatologist for further evaluation but has not yet done so.  The patient also expresses concern about the necessity of contrast for her CT scans. She was previously told that contrast was no longer needed, but she is worried that this might result in missed diagnoses. The patient has a history of cervical cancer and a partial hysterectomy, and she still has her ovaries. She also has a family history of colon cancer.         ALLERGIES:  is allergic to atorvastatin, bactrim [sulfamethoxazole-trimethoprim], clavulanic acid, guaifenesin & derivatives, and tamoxifen.  MEDICATIONS:  Current Outpatient Medications  Medication Sig Dispense Refill   acetaminophen (TYLENOL 8 HOUR) 650 MG CR tablet      acetaminophen (TYLENOL) 160 MG/5ML liquid Take 500 mg by mouth every 4 (four) hours as needed for fever.     LORazepam (  ATIVAN) 0.5 MG tablet Take 1 tablet 1 hour before scan- may repeat at time of scan if needed. 3 tablet 1   Pediatric Multivitamins-Fl (MULTIVITAMIN + FLUORIDE) 0.25 MG CHEW      cefdinir (OMNICEF) 300 MG capsule Take 1 capsule (300 mg total) by mouth 2 (two) times daily.  (Patient not taking: Reported on 01/14/2023) 14 capsule 0   No current facility-administered medications for this visit.    PHYSICAL EXAMINATION: ECOG PERFORMANCE STATUS: 1 - Symptomatic but completely ambulatory  Vitals:   01/14/23 1346  BP: (!) 107/56  Pulse: 86  Resp: 18  Temp: (!) 97.3 F (36.3 C)  SpO2: 99%   Filed Weights   01/14/23 1346  Weight: 140 lb 14.4 oz (63.9 kg)    Physical Exam   MEASUREMENTS: WT- 140-141 NECK: Subpectoral lymph nodes slightly smaller.       LABORATORY DATA:  I have reviewed the data as listed    Latest Ref Rng & Units 12/17/2022    2:35 PM 10/02/2022   10:47 AM 07/21/2022   12:49 PM  CMP  Glucose 70 - 99 mg/dL 99  161  89   BUN 8 - 23 mg/dL 30  29  30    Creatinine 0.44 - 1.00 mg/dL 0.96  0.45  4.09   Sodium 135 - 145 mmol/L 139  138  138   Potassium 3.5 - 5.1 mmol/L 4.9  4.4  4.6   Chloride 98 - 111 mmol/L 103  104  103   CO2 22 - 32 mmol/L 31  30  30    Calcium 8.9 - 10.3 mg/dL 9.5  9.4  9.4   Total Protein 6.5 - 8.1 g/dL 6.9  7.0  6.8   Total Bilirubin 0.3 - 1.2 mg/dL 0.6  0.7  0.6   Alkaline Phos 38 - 126 U/L 56  58  54   AST 15 - 41 U/L 26  23  23    ALT 0 - 44 U/L 18  16  14      Lab Results  Component Value Date   WBC 4.8 12/17/2022   HGB 14.4 12/17/2022   HCT 44.0 12/17/2022   MCV 96.5 12/17/2022   PLT 191 12/17/2022   NEUTROABS 3.3 12/17/2022    ASSESSMENT & PLAN:  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 Now presenting with metastatic disease based on scans done 03/11/2020 CT CAP: Numerous lytic lesions in the thoracic spine several with expansion extraosseous extension. 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  Uncertain if any radiation was given to the  skull lesions.   Current Treatment:   letrozole. Started December 2021, discontinued for progression and switched to Faslodex 10/15/2022   Poor nutritional status: Since the tube feedings have started her weight is improving.  Energy levels are improving and she is got a better positive outlook.    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  No evidence of visceral metastatic disease. CT CAP 04/15/2021: No substantial interval change similar bone metastases CT CAP: 10/16/21: Enlarging Rt Subpectoral/ Axill LN, stable bone mets. PET CT scan 01/10/2022: Hypermetabolic right subpectoral/axillary lymph node, stable bone metastases CT CAP 04/14/2022: Stable findings.  Right axillary and subpectoral lymph nodes are similar, bone metastases similar to before CT CAP 10/08/2022:  Slight interval enlargement of the right subpectoral lymph node 6 cm (was 4.8 cm) stable bone metastases. CT CAP 12/30/2022: No significant interval change in the bone metastases.  Stable bulky subpectoral lymph nodes.   Faslodex toxicities: Fatigue Continue with monthly Faslodex I will see the patient in 3 months ------------------------------------- Assessment and Plan    Metastatic Breast Cancer Stable disease on recent imaging with slight decrease in size of subpectoral lymph nodes. No new lesions identified. Patient reports intermittent pain in various locations, managed with Tylenol. -Continue current treatment regimen with monthly injections. -Next CT scan in 4 months.  Nutrition/Weight Loss Patient reports concern about 2-pound weight loss. No significant weight loss trend identified. -Refer to nutritionist for phone consultation to discuss potential dietary modifications.  Skin Lesion Patient reports a persistent skin lesion under left eye that occasionally bleeds. -Refer to dermatology for evaluation and potential biopsy.  Bone Health Patient is on a bone agent for  metastatic disease to the bones. -Continue bone agent every 3 months.  Follow-up Schedule appointments for monthly injections and next imaging study.          No orders of the defined types were placed in this encounter.  The patient has a good understanding of the overall plan. she agrees with it. she will call with any problems that may develop before the next visit here. Total time spent: 30 mins including face to face time and time spent for planning, charting and co-ordination of care   Tamsen Meek, MD 01/15/23

## 2023-01-29 ENCOUNTER — Encounter: Payer: Self-pay | Admitting: *Deleted

## 2023-01-29 NOTE — Progress Notes (Signed)
Per pt request, RN mailed recent lab work results to address on file.

## 2023-02-11 ENCOUNTER — Inpatient Hospital Stay: Payer: Medicare Other | Attending: Hematology and Oncology

## 2023-02-11 ENCOUNTER — Ambulatory Visit: Payer: Medicare Other

## 2023-02-11 VITALS — BP 114/64 | HR 79 | Resp 16

## 2023-02-11 DIAGNOSIS — Z853 Personal history of malignant neoplasm of breast: Secondary | ICD-10-CM | POA: Diagnosis not present

## 2023-02-11 DIAGNOSIS — C7951 Secondary malignant neoplasm of bone: Secondary | ICD-10-CM | POA: Diagnosis present

## 2023-02-11 DIAGNOSIS — Z79818 Long term (current) use of other agents affecting estrogen receptors and estrogen levels: Secondary | ICD-10-CM | POA: Insufficient documentation

## 2023-02-11 DIAGNOSIS — Z170 Estrogen receptor positive status [ER+]: Secondary | ICD-10-CM | POA: Diagnosis not present

## 2023-02-11 DIAGNOSIS — Z5111 Encounter for antineoplastic chemotherapy: Secondary | ICD-10-CM | POA: Insufficient documentation

## 2023-02-11 DIAGNOSIS — C7931 Secondary malignant neoplasm of brain: Secondary | ICD-10-CM | POA: Diagnosis present

## 2023-02-11 MED ORDER — FULVESTRANT 250 MG/5ML IM SOSY
500.0000 mg | PREFILLED_SYRINGE | Freq: Once | INTRAMUSCULAR | Status: AC
  Administered 2023-02-11: 500 mg via INTRAMUSCULAR
  Filled 2023-02-11: qty 10

## 2023-02-11 NOTE — Patient Instructions (Signed)

## 2023-02-22 ENCOUNTER — Encounter: Payer: Self-pay | Admitting: Family Medicine

## 2023-02-22 ENCOUNTER — Ambulatory Visit (INDEPENDENT_AMBULATORY_CARE_PROVIDER_SITE_OTHER): Payer: Medicare Other | Admitting: Family Medicine

## 2023-02-22 VITALS — BP 110/77 | HR 80 | Ht 66.0 in | Wt 141.2 lb

## 2023-02-22 DIAGNOSIS — N3000 Acute cystitis without hematuria: Secondary | ICD-10-CM | POA: Diagnosis not present

## 2023-02-22 DIAGNOSIS — R300 Dysuria: Secondary | ICD-10-CM | POA: Diagnosis not present

## 2023-02-22 DIAGNOSIS — R350 Frequency of micturition: Secondary | ICD-10-CM

## 2023-02-22 DIAGNOSIS — J329 Chronic sinusitis, unspecified: Secondary | ICD-10-CM | POA: Diagnosis not present

## 2023-02-22 DIAGNOSIS — J40 Bronchitis, not specified as acute or chronic: Secondary | ICD-10-CM

## 2023-02-22 DIAGNOSIS — Z978 Presence of other specified devices: Secondary | ICD-10-CM

## 2023-02-22 LAB — POCT URINALYSIS DIP (CLINITEK)
Bilirubin, UA: NEGATIVE
Blood, UA: NEGATIVE
Glucose, UA: NEGATIVE mg/dL
Ketones, POC UA: NEGATIVE mg/dL
Nitrite, UA: NEGATIVE
Spec Grav, UA: 1.02 (ref 1.010–1.025)
Urobilinogen, UA: 2 U/dL — AB
pH, UA: 7 (ref 5.0–8.0)

## 2023-02-22 MED ORDER — AEROCHAMBER PLUS FLO-VU MISC: 3 refills | Status: DC

## 2023-02-22 MED ORDER — CEFDINIR 300 MG PO CAPS: 300.0000 mg | ORAL_CAPSULE | Freq: Two times a day (BID) | ORAL | 0 refills | Status: AC

## 2023-02-22 MED ORDER — ALBUTEROL SULFATE HFA 108 (90 BASE) MCG/ACT IN AERS: 2.0000 | INHALATION_SPRAY | Freq: Four times a day (QID) | RESPIRATORY_TRACT | 11 refills | Status: DC | PRN

## 2023-02-22 MED ORDER — CEFDINIR 300 MG PO CAPS
300.0000 mg | ORAL_CAPSULE | Freq: Two times a day (BID) | ORAL | 0 refills | Status: DC
Start: 2023-02-22 — End: 2023-02-22

## 2023-02-22 NOTE — Progress Notes (Signed)
Acute Office Visit  Subjective:     Patient ID: Jodi Kaufman, female    DOB: August 19, 1940, 82 y.o.   MRN: [id number]  Chief Complaint  Patient presents with   Cough   Urinary Tract Infection    HPI Patient is in today for acute visit of productive cough. She also has concerns about a knot on her wrist. Pt has a hx of breast cancer with mets to bone. Does have a history of sinus infections per chart review.   Review of Systems  Constitutional:  Negative for chills and fever.  HENT:  Positive for congestion.   Respiratory:  Negative for cough and shortness of breath.   Cardiovascular:  Negative for chest pain.  Neurological:  Negative for headaches.        Objective:    BP 110/77   Pulse 80   Ht 5\' 6"  (1.676 m)   Wt 141 lb 4 oz (64.1 kg)   SpO2 99%   BMI 22.80 kg/m    Physical Exam Vitals and nursing note reviewed.  Constitutional:      General: She is not in acute distress.    Appearance: Normal appearance.  HENT:     Head: Normocephalic and atraumatic.     Right Ear: External ear normal.     Left Ear: External ear normal.     Nose: Nose normal.  Eyes:     Conjunctiva/sclera: Conjunctivae normal.  Cardiovascular:     Rate and Rhythm: Normal rate and regular rhythm.  Pulmonary:     Effort: Pulmonary effort is normal.     Breath sounds: Normal breath sounds.  Neurological:     General: No focal deficit present.     Mental Status: She is alert and oriented to person, place, and time.  Psychiatric:        Mood and Affect: Mood normal.        Behavior: Behavior normal.        Thought Content: Thought content normal.        Judgment: Judgment normal.     Results for orders placed or performed in visit on 02/22/23  POCT URINALYSIS DIP (CLINITEK)  Result Value Ref Range   Color, UA yellow yellow   Clarity, UA clear clear   Glucose, UA negative negative mg/dL   Bilirubin, UA negative negative   Ketones, POC UA negative negative mg/dL   Spec Grav, UA  7.829 1.010 - 1.025   Blood, UA negative negative   pH, UA 7.0 5.0 - 8.0   POC PROTEIN,UA trace negative, trace   Urobilinogen, UA 2.0 (A) 0.2 or 1.0 E.U./dL   Nitrite, UA Negative Negative   Leukocytes, UA Small (1+) (A) Negative        Assessment & Plan:   Problem List Items Addressed This Visit       Respiratory   Sinobronchitis    Pt feels like she has thick mucus secretions. Recommend humidifier and vick's vapor rub to help lossen secretions. Also recommending small liquids by mouth as tolerated  - will give her omnicef that she can't put in her peg tube. Truman Hayward will also treat her urine infection as well - if no better once treatment is over may need to consider cxr but on exam today lungs sound clear       Relevant Medications   cefdinir (OMNICEF) 300 MG capsule     Genitourinary   Acute cystitis without hematuria - Primary    Pt presents with concerns  of dysuria. POC UA shows positive leuks and in the setting of her history will go ahead and treat. Also having some congestion and sinus issues so we will choose omnicef       Relevant Medications   cefdinir (OMNICEF) 300 MG capsule     Other   Uses feeding tube    Pt wanted to show me her feeding tube. I am able to appreciate some secretions in the tube and patient has been doing well with this. Says sometimes she gets leakage with more movement and has to wear a guaze pad most days. Discussed that this can happen but to keep an eye and if she is noticing more secretions we will need to discuss possible replacement      Other Visit Diagnoses     Dysuria       Relevant Orders   POCT URINALYSIS DIP (CLINITEK) (Completed)   Urine Culture   Urine frequency       Relevant Orders   POCT URINALYSIS DIP (CLINITEK) (Completed)   Urine Culture       Meds ordered this encounter  Medications   DISCONTD: cefdinir (OMNICEF) 300 MG capsule    Sig: Take 1 capsule (300 mg total) by mouth 2 (two) times daily for 7 days.     Dispense:  14 capsule    Refill:  0   albuterol (VENTOLIN HFA) 108 (90 Base) MCG/ACT inhaler    Sig: Inhale 2 puffs into the lungs every 6 (six) hours as needed for wheezing.    Dispense:  8.5 g    Refill:  11    Please use generic pro-air   Spacer/Aero-Holding Chambers (AEROCHAMBER PLUS WITH MASK) inhaler    Sig: Use with inhaler    Dispense:  1 each    Refill:  3    Please give chamber with mask for patient   cefdinir (OMNICEF) 300 MG capsule    Sig: Place 1 capsule (300 mg total) into feeding tube 2 (two) times daily for 7 days.    Dispense:  14 capsule    Refill:  0    No follow-ups on file.  Charlton Amor, DO

## 2023-02-22 NOTE — Patient Instructions (Signed)
Try   Vick's Vapor Rub   Humidifier   Could try mucinex   Increase liquids by mouth as tolerated   Take antibiotic    Pleasure meeting you!

## 2023-02-22 NOTE — Assessment & Plan Note (Addendum)
Pt feels like she has thick mucus secretions. Recommend humidifier and vick's vapor rub to help lossen secretions. Also recommending small liquids by mouth as tolerated  - will give her omnicef that she can't put in her peg tube. Truman Hayward will also treat her urine infection as well - if no better once treatment is over may need to consider cxr but on exam today lungs sound clear

## 2023-02-22 NOTE — Assessment & Plan Note (Signed)
Pt wanted to show me her feeding tube. I am able to appreciate some secretions in the tube and patient has been doing well with this. Says sometimes she gets leakage with more movement and has to wear a guaze pad most days. Discussed that this can happen but to keep an eye and if she is noticing more secretions we will need to discuss possible replacement

## 2023-02-22 NOTE — Assessment & Plan Note (Signed)
Pt presents with concerns of dysuria. POC UA shows positive leuks and in the setting of her history will go ahead and treat. Also having some congestion and sinus issues so we will choose omnicef

## 2023-02-25 LAB — URINE CULTURE

## 2023-03-01 ENCOUNTER — Telehealth: Payer: Self-pay | Admitting: Family Medicine

## 2023-03-01 NOTE — Telephone Encounter (Signed)
Patient called back to say that the antibiotic is working she would like a phone call she has something to explain about it Please call [phone]

## 2023-03-02 NOTE — Telephone Encounter (Signed)
Pt called in to say she is not allergic to the antibiotic that she thought she was having an allergic reaction to. Roselyn Reef, CMA

## 2023-03-11 ENCOUNTER — Inpatient Hospital Stay: Payer: Medicare Other | Attending: Hematology and Oncology

## 2023-03-11 VITALS — BP 108/68 | HR 82 | Resp 16

## 2023-03-11 DIAGNOSIS — Z170 Estrogen receptor positive status [ER+]: Secondary | ICD-10-CM | POA: Diagnosis not present

## 2023-03-11 DIAGNOSIS — C7931 Secondary malignant neoplasm of brain: Secondary | ICD-10-CM | POA: Diagnosis present

## 2023-03-11 DIAGNOSIS — C7951 Secondary malignant neoplasm of bone: Secondary | ICD-10-CM | POA: Diagnosis present

## 2023-03-11 DIAGNOSIS — Z5111 Encounter for antineoplastic chemotherapy: Secondary | ICD-10-CM | POA: Insufficient documentation

## 2023-03-11 MED ORDER — FULVESTRANT 250 MG/5ML IM SOSY
500.0000 mg | PREFILLED_SYRINGE | Freq: Once | INTRAMUSCULAR | Status: AC
  Administered 2023-03-11: 500 mg via INTRAMUSCULAR
  Filled 2023-03-11: qty 10

## 2023-03-11 NOTE — Patient Instructions (Signed)

## 2023-03-31 ENCOUNTER — Other Ambulatory Visit: Payer: Self-pay | Admitting: *Deleted

## 2023-03-31 ENCOUNTER — Telehealth: Payer: Self-pay | Admitting: *Deleted

## 2023-03-31 DIAGNOSIS — C7951 Secondary malignant neoplasm of bone: Secondary | ICD-10-CM

## 2023-03-31 NOTE — Telephone Encounter (Signed)
Received VM from pt.  RN attempt x1 to return call.  No answer and unable to LVM due to VM being full.

## 2023-04-08 ENCOUNTER — Other Ambulatory Visit: Payer: Self-pay | Admitting: *Deleted

## 2023-04-08 ENCOUNTER — Ambulatory Visit: Payer: Medicare Other | Admitting: Hematology and Oncology

## 2023-04-08 ENCOUNTER — Ambulatory Visit: Payer: Medicare Other

## 2023-04-08 ENCOUNTER — Other Ambulatory Visit: Payer: Medicare Other

## 2023-04-08 DIAGNOSIS — C7951 Secondary malignant neoplasm of bone: Secondary | ICD-10-CM

## 2023-04-09 ENCOUNTER — Other Ambulatory Visit: Payer: Self-pay | Admitting: *Deleted

## 2023-04-09 ENCOUNTER — Inpatient Hospital Stay: Payer: Medicare Other | Attending: Hematology and Oncology

## 2023-04-09 DIAGNOSIS — C7951 Secondary malignant neoplasm of bone: Secondary | ICD-10-CM | POA: Insufficient documentation

## 2023-04-09 DIAGNOSIS — R42 Dizziness and giddiness: Secondary | ICD-10-CM

## 2023-04-09 DIAGNOSIS — Z5111 Encounter for antineoplastic chemotherapy: Secondary | ICD-10-CM | POA: Diagnosis present

## 2023-04-09 DIAGNOSIS — C7931 Secondary malignant neoplasm of brain: Secondary | ICD-10-CM | POA: Diagnosis present

## 2023-04-09 DIAGNOSIS — C50911 Malignant neoplasm of unspecified site of right female breast: Secondary | ICD-10-CM | POA: Diagnosis not present

## 2023-04-09 DIAGNOSIS — Z170 Estrogen receptor positive status [ER+]: Secondary | ICD-10-CM | POA: Diagnosis not present

## 2023-04-09 LAB — CBC WITH DIFFERENTIAL (CANCER CENTER ONLY)
Abs Immature Granulocytes: 0.01 10*3/uL (ref 0.00–0.07)
Basophils Absolute: 0 10*3/uL (ref 0.0–0.1)
Basophils Relative: 1 %
Eosinophils Absolute: 0.1 10*3/uL (ref 0.0–0.5)
Eosinophils Relative: 3 %
HCT: 45.3 % (ref 36.0–46.0)
Hemoglobin: 14.8 g/dL (ref 12.0–15.0)
Immature Granulocytes: 0 %
Lymphocytes Relative: 10 %
Lymphs Abs: 0.6 10*3/uL — ABNORMAL LOW (ref 0.7–4.0)
MCH: 31.8 pg (ref 26.0–34.0)
MCHC: 32.7 g/dL (ref 30.0–36.0)
MCV: 97.2 fL (ref 80.0–100.0)
Monocytes Absolute: 0.6 10*3/uL (ref 0.1–1.0)
Monocytes Relative: 11 %
Neutro Abs: 4.3 10*3/uL (ref 1.7–7.7)
Neutrophils Relative %: 75 %
Platelet Count: 188 10*3/uL (ref 150–400)
RBC: 4.66 MIL/uL (ref 3.87–5.11)
RDW: 14 % (ref 11.5–15.5)
WBC Count: 5.6 10*3/uL (ref 4.0–10.5)
nRBC: 0 % (ref 0.0–0.2)

## 2023-04-09 LAB — CMP (CANCER CENTER ONLY)
ALT: 23 U/L (ref 0–44)
AST: 31 U/L (ref 15–41)
Albumin: 4.2 g/dL (ref 3.5–5.0)
Alkaline Phosphatase: 56 U/L (ref 38–126)
Anion gap: 5 (ref 5–15)
BUN: 32 mg/dL — ABNORMAL HIGH (ref 8–23)
CO2: 31 mmol/L (ref 22–32)
Calcium: 9.7 mg/dL (ref 8.9–10.3)
Chloride: 103 mmol/L (ref 98–111)
Creatinine: 0.84 mg/dL (ref 0.44–1.00)
GFR, Estimated: >60 mL/min (ref >60)
Glucose, Bld: 96 mg/dL (ref 70–99)
Potassium: 4.8 mmol/L (ref 3.5–5.1)
Sodium: 139 mmol/L (ref 135–145)
Total Bilirubin: 0.7 mg/dL (ref <1.2)
Total Protein: 7.2 g/dL (ref 6.5–8.1)

## 2023-04-09 MED ORDER — LORAZEPAM 0.5 MG PO TABS: ORAL_TABLET | ORAL | 0 refills | Status: DC

## 2023-04-09 NOTE — Progress Notes (Signed)
Received call from pt with complaint of ongoing dizziness.  RN reviewed with MD and verbal orders received for Brain MRI for further evaluation. Orders placed, pt states she will contact central scheduling for appt.

## 2023-04-09 NOTE — Telephone Encounter (Signed)
Received call from pt with request for Ativan be sent to pharmacy for claustrophobia with CT scan.  Verbal orders received from MD for pt to be prescribed Ativan 0.5 mg p.o tablet with instructions to take 1 tablet 1 hr prior to scan and may repeat 30 min prior to scan if needed.  Prescription sent to pharmacy on file, pt educated and verbalized understanding.

## 2023-04-14 ENCOUNTER — Ambulatory Visit (HOSPITAL_COMMUNITY): Payer: Medicare Other

## 2023-04-14 ENCOUNTER — Encounter: Payer: Self-pay | Admitting: *Deleted

## 2023-04-14 NOTE — Progress Notes (Signed)
Received message from CT team stating pt was no show for CT today.  RN attempt x1 to contact pt.  No answer.  Unable to LVM due to VM being full.

## 2023-04-22 ENCOUNTER — Inpatient Hospital Stay: Payer: Medicare Other

## 2023-04-22 ENCOUNTER — Other Ambulatory Visit: Payer: Medicare Other

## 2023-04-22 ENCOUNTER — Inpatient Hospital Stay: Payer: Medicare Other | Admitting: Hematology and Oncology

## 2023-04-22 ENCOUNTER — Telehealth: Payer: Self-pay | Admitting: Hematology and Oncology

## 2023-04-23 ENCOUNTER — Ambulatory Visit (HOSPITAL_COMMUNITY): Payer: Medicare Other

## 2023-04-23 ENCOUNTER — Inpatient Hospital Stay: Payer: Medicare Other

## 2023-04-23 VITALS — BP 114/65 | HR 87

## 2023-04-23 DIAGNOSIS — Z5111 Encounter for antineoplastic chemotherapy: Secondary | ICD-10-CM | POA: Diagnosis not present

## 2023-04-23 DIAGNOSIS — C7951 Secondary malignant neoplasm of bone: Secondary | ICD-10-CM

## 2023-04-23 MED ORDER — FULVESTRANT 250 MG/5ML IM SOSY
500.0000 mg | PREFILLED_SYRINGE | Freq: Once | INTRAMUSCULAR | Status: AC
  Administered 2023-04-23: 500 mg via INTRAMUSCULAR
  Filled 2023-04-23: qty 10

## 2023-04-23 MED ORDER — DENOSUMAB 120 MG/1.7ML ~~LOC~~ SOLN
120.0000 mg | Freq: Once | SUBCUTANEOUS | Status: AC
  Administered 2023-04-23: 120 mg via SUBCUTANEOUS
  Filled 2023-04-23: qty 1.7

## 2023-04-29 ENCOUNTER — Ambulatory Visit (HOSPITAL_COMMUNITY)
Admission: RE | Admit: 2023-04-29 | Discharge: 2023-04-29 | Disposition: A | Discharge status: Home / Self Care | Payer: Medicare Other | Source: Ambulatory Visit | Attending: Hematology and Oncology | Admitting: Hematology and Oncology

## 2023-04-29 DIAGNOSIS — C7951 Secondary malignant neoplasm of bone: Secondary | ICD-10-CM | POA: Insufficient documentation

## 2023-04-29 MED ORDER — IOHEXOL 300 MG/ML  SOLN
100.0000 mL | Freq: Once | INTRAMUSCULAR | Status: AC | PRN
  Administered 2023-04-29: 100 mL via INTRAVENOUS

## 2023-05-03 ENCOUNTER — Telehealth: Payer: Self-pay

## 2023-05-03 NOTE — Telephone Encounter (Addendum)
 Called pt with message below. Pt verbalized understanding.----- Message from [name] sent at 04/30/2023  3:00 PM EST ----- Please let patient know scans show no progressive cancer.  This is good news.  VG will discus swith her further 05/10/2023 ----- Message ----- From: Interface, Rad Results In Sent: 04/29/2023   3:43 PM EST To: [name], MD

## 2023-05-06 ENCOUNTER — Ambulatory Visit: Payer: Medicare Other

## 2023-05-10 ENCOUNTER — Inpatient Hospital Stay: Payer: Medicare Other | Attending: Hematology and Oncology | Admitting: Hematology and Oncology

## 2023-05-10 ENCOUNTER — Other Ambulatory Visit: Payer: Self-pay | Admitting: *Deleted

## 2023-05-10 ENCOUNTER — Inpatient Hospital Stay: Payer: Medicare Other

## 2023-05-10 ENCOUNTER — Encounter: Payer: Self-pay | Admitting: Hematology and Oncology

## 2023-05-10 VITALS — BP 114/60 | HR 95 | Temp 97.8°F | Resp 18 | Ht 66.0 in | Wt 140.5 lb

## 2023-05-10 DIAGNOSIS — C7951 Secondary malignant neoplasm of bone: Secondary | ICD-10-CM

## 2023-05-10 DIAGNOSIS — I700 Atherosclerosis of aorta: Secondary | ICD-10-CM | POA: Insufficient documentation

## 2023-05-10 DIAGNOSIS — Z5111 Encounter for antineoplastic chemotherapy: Secondary | ICD-10-CM | POA: Diagnosis present

## 2023-05-10 DIAGNOSIS — C50919 Malignant neoplasm of unspecified site of unspecified female breast: Secondary | ICD-10-CM | POA: Diagnosis not present

## 2023-05-10 DIAGNOSIS — I2510 Atherosclerotic heart disease of native coronary artery without angina pectoris: Secondary | ICD-10-CM | POA: Insufficient documentation

## 2023-05-10 DIAGNOSIS — Z79811 Long term (current) use of aromatase inhibitors: Secondary | ICD-10-CM | POA: Diagnosis not present

## 2023-05-10 DIAGNOSIS — C50911 Malignant neoplasm of unspecified site of right female breast: Secondary | ICD-10-CM | POA: Diagnosis present

## 2023-05-10 DIAGNOSIS — Z1722 Progesterone receptor negative status: Secondary | ICD-10-CM | POA: Insufficient documentation

## 2023-05-10 DIAGNOSIS — C50412 Malignant neoplasm of upper-outer quadrant of left female breast: Secondary | ICD-10-CM

## 2023-05-10 DIAGNOSIS — R5383 Other fatigue: Secondary | ICD-10-CM | POA: Insufficient documentation

## 2023-05-10 DIAGNOSIS — C7931 Secondary malignant neoplasm of brain: Secondary | ICD-10-CM | POA: Insufficient documentation

## 2023-05-10 DIAGNOSIS — Z79899 Other long term (current) drug therapy: Secondary | ICD-10-CM | POA: Insufficient documentation

## 2023-05-10 DIAGNOSIS — Z170 Estrogen receptor positive status [ER+]: Secondary | ICD-10-CM | POA: Insufficient documentation

## 2023-05-10 DIAGNOSIS — Z881 Allergy status to other antibiotic agents status: Secondary | ICD-10-CM | POA: Insufficient documentation

## 2023-05-10 NOTE — Progress Notes (Signed)
 Patient Care Team: Alvan Dorothyann BIRCH, MD as PCP - General (Family Medicine) Odean Potts, MD as Consulting Physician (Hematology and Oncology)  DIAGNOSIS:  Encounter Diagnosis  Name Primary?   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  Moved to [facility] December 2021 to be closer to her family   02/2020 -  Anti-estrogen oral therapy   Anastrozole  daily   10/21/2020 Treatment Plan Change   Xgeva  every 3 months   10/06/2022 Imaging   IMPRESSION: 1. Slight interval enlargement of bulky right subpectoral lymph nodes, consistent with worsened nodal metastatic disease. 2. Multiple unchanged mixed lytic and sclerotic osseous metastatic lesions, notable for a large, expansile lesion of the right second rib as well as numerous additional expansile rib lesions, thoracolumbar vertebral body lesions, and pelvic lesions. 3. No other evidence of metastatic disease in the chest, abdomen, or pelvis. 4. Coronary artery disease. 5. Aortic valve calcifications. Correlate for echocardiographic evidence of aortic valve dysfunction.   Aortic Atherosclerosis (ICD10-I70.0).     Electronically Signed   By: [name]   On: 10/08/2022 07:16   10/15/2022 Cancer Staging   Staging form: Breast, AJCC 8th Edition - Clinical: Stage IV (pM1) - Signed by Crawford Morna Pickle, NP on 10/15/2022     CHIEF COMPLIANT:  Follow-up to review CT scans  HISTORY OF PRESENT ILLNESS:  History of Present Illness   The patient, with a known history of bone metastasis, presents for a follow-up visit after a recent scan. The scan results indicate no significant change in the bone metastasis and lymph nodes, which is interpreted as a positive outcome. The patient reports no new symptoms or side effects from the current treatment. However, the patient expresses a strong preference for not supporting [place] due to personal beliefs and is concerned that her medication is manufactured there. The patient is open to exploring other treatment options if an alternative source for the medication cannot be found.         ALLERGIES:  is allergic to atorvastatin, bactrim  [sulfamethoxazole -trimethoprim ], clavulanic acid, guaifenesin & derivatives, and tamoxifen.  MEDICATIONS:  Current Outpatient Medications  Medication Sig Dispense Refill   acetaminophen  (TYLENOL ) 160 MG/5ML liquid Take 500 mg by mouth every 4 (four) hours as needed for fever.     albuterol  (VENTOLIN  HFA) 108 (90 Base) MCG/ACT inhaler Inhale 2 puffs into the lungs every 6 (six) hours as needed for wheezing. 8.5 g 11   LORazepam  (ATIVAN ) 0.5 MG tablet Take 1 tablet 1 hour before scan- may repeat at time of scan if needed. 2 tablet 0   Spacer/Aero-Holding Chambers (AEROCHAMBER PLUS WITH MASK) inhaler Use with inhaler 1 each 3   No current facility-administered medications for this visit.    PHYSICAL EXAMINATION: ECOG PERFORMANCE STATUS: 1 - Symptomatic but completely ambulatory  Vitals:   05/10/23 1151  BP: 114/60  Pulse: 95  Resp: 18  Temp: 97.8 F (36.6 C)  SpO2: 100%  Filed Weights   05/10/23 1151  Weight: 140 lb 8 oz (63.7 kg)    Physical Exam          (exam performed in the presence of a chaperone)  LABORATORY DATA:  I have reviewed the data as listed    Latest Ref Rng & Units 04/09/2023    2:02 PM 12/17/2022    2:35 PM 10/02/2022   10:47  AM  CMP  Glucose 70 - 99 mg/dL 96  99  899   BUN 8 - 23 mg/dL 32  30  29   Creatinine 0.44 - 1.00 mg/dL 9.15  9.12  9.12   Sodium 135 - 145 mmol/L 139  139  138   Potassium 3.5 - 5.1 mmol/L 4.8  4.9  4.4   Chloride 98 - 111 mmol/L 103  103  104   CO2 22 - 32 mmol/L 31  31  30    Calcium 8.9 - 10.3 mg/dL 9.7  9.5  9.4   Total Protein 6.5 - 8.1 g/dL 7.2  6.9  7.0   Total Bilirubin <1.2 mg/dL 0.7  0.6  0.7   Alkaline Phos 38 - 126 U/L 56  56  58   AST 15 - 41 U/L 31  26  23    ALT 0 - 44 U/L 23  18  16      Lab Results  Component Value Date   WBC 5.6 04/09/2023   HGB 14.8 04/09/2023   HCT 45.3 04/09/2023   MCV 97.2 04/09/2023   PLT 188 04/09/2023   NEUTROABS 4.3 04/09/2023    ASSESSMENT & PLAN:  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 Now presenting with metastatic disease based on scans done 03/11/2020 CT CAP: Numerous lytic lesions in the thoracic spine several with expansion extraosseous extension. 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  Uncertain if any radiation was given to the skull lesions.   Current Treatment:   letrozole . Started December 2021, discontinued for progression and switched to Faslodex  10/15/2022   Poor nutritional status: Since the tube feedings have started her weight is improving.  Energy levels are improving and she is got a better positive outlook.    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  No evidence of visceral metastatic disease. CT CAP 04/15/2021: No substantial interval change similar bone metastases CT CAP: 10/16/21: Enlarging Rt Subpectoral/ Axill LN, stable bone mets. PET CT scan 01/10/2022: Hypermetabolic right subpectoral/axillary lymph node,  stable bone metastases CT CAP 10/08/2022: Slight interval enlargement of the right subpectoral lymph node 6 cm (was 4.8 cm) stable bone metastases. CT CAP 12/30/2022: No significant interval change in the bone metastases.  Stable bulky subpectoral lymph nodes. CT CAP 04/29/2023: No significant interval change in the bone metastasis, no change in the bulky right subpectoral lymph nodes, new hypodense foci in the cecum (?  Ingested material or tablets)  Faslodex  toxicities: Fatigue Continue with monthly Faslodex  I will see the patient in 3 months ------------------------------------- Assessment and Plan    Metastatic Bone Disease Stable bone metastasis with no significant interval change. No new spots or progression in the chest, abdomen, and pelvis. -Continue current treatment regimen.  Possible Cecal Abnormality New focus in the cecum, possibly encapsulated material or stool. No symptoms of constipation  or blood in stool. Last colonoscopy approximately in 2013. -Consider gastroenterology consultation for further evaluation.  Treatment Concerns Patient expressed concerns about the origin of her current treatment medication. Discussed the importance of consistent treatment and potential alternatives. -Explore other sources or manufacturers for the current treatment medication. -Order [employer] lab test to look for mutations on cancer cells and potential eligibility for new treatments. -Schedule follow-up appointment in two weeks to discuss results and potential treatment options.     We found out that Faslodex  is being manufactured in [place].     No orders of the defined types were placed in this encounter.  The patient has a good understanding of the overall plan. she agrees with it. she will call with any problems that may develop before the next visit here. Total time spent: 30 mins including face to face time and time spent for planning, charting and co-ordination of care   Naomi MARLA Chad, MD 05/10/23

## 2023-05-10 NOTE — Assessment & Plan Note (Signed)
 Originally diagnosed in 1995, patient did not take prescribed tamoxifen. Now presenting with metastatic disease based on scans done 03/11/2020 CT CAP: Numerous lytic lesions in the thoracic spine several with expansion extraosseous extension. 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  Uncertain if any radiation was given to the skull lesions.   Current Treatment:   letrozole . Started December 2021, discontinued for progression and switched to Faslodex  10/15/2022   Poor nutritional status: Since the tube feedings have started her weight is improving.  Energy levels are improving and she is got a better positive outlook.    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  No evidence of visceral metastatic disease. CT CAP 04/15/2021: No substantial interval change similar bone metastases CT CAP: 10/16/21: Enlarging Rt Subpectoral/ Axill LN, stable bone mets. PET CT scan 01/10/2022: Hypermetabolic right subpectoral/axillary lymph node, stable bone metastases CT CAP 10/08/2022: Slight interval enlargement of the right subpectoral lymph node 6 cm (was 4.8 cm) stable bone metastases. CT CAP 12/30/2022: No significant interval change in the bone metastases.  Stable bulky subpectoral lymph nodes. CT CAP 04/29/2023: No significant interval change in the bone metastasis, no change in the bulky right subpectoral lymph nodes, new hypodense foci in the cecum (?  Ingested material or tablets)  Faslodex  toxicities: Fatigue Continue with monthly Faslodex  I will see the patient in 3 months

## 2023-05-12 ENCOUNTER — Ambulatory Visit (HOSPITAL_COMMUNITY): Payer: Medicare Other

## 2023-05-14 ENCOUNTER — Telehealth: Payer: Self-pay | Admitting: *Deleted

## 2023-05-14 NOTE — Telephone Encounter (Signed)
Received call from pt stating she wants to cancel injection for Faslodex due to the injection coming from [place].  Pt states she will further discuss with MD at next visit.

## 2023-05-20 ENCOUNTER — Ambulatory Visit: Payer: Medicare Other

## 2023-05-20 ENCOUNTER — Inpatient Hospital Stay: Payer: Medicare Other | Admitting: Hematology and Oncology

## 2023-05-25 ENCOUNTER — Inpatient Hospital Stay (HOSPITAL_BASED_OUTPATIENT_CLINIC_OR_DEPARTMENT_OTHER): Payer: Medicare Other | Admitting: Hematology and Oncology

## 2023-05-25 ENCOUNTER — Inpatient Hospital Stay: Payer: Medicare Other

## 2023-05-25 ENCOUNTER — Encounter: Payer: Self-pay | Admitting: Hematology and Oncology

## 2023-05-25 VITALS — BP 100/56 | HR 87 | Temp 100.4°F | Resp 17 | Ht 66.0 in | Wt 140.0 lb

## 2023-05-25 DIAGNOSIS — C50919 Malignant neoplasm of unspecified site of unspecified female breast: Secondary | ICD-10-CM | POA: Diagnosis not present

## 2023-05-25 DIAGNOSIS — C7951 Secondary malignant neoplasm of bone: Secondary | ICD-10-CM

## 2023-05-25 DIAGNOSIS — C773 Secondary and unspecified malignant neoplasm of axilla and upper limb lymph nodes: Secondary | ICD-10-CM | POA: Diagnosis not present

## 2023-05-25 DIAGNOSIS — C50911 Malignant neoplasm of unspecified site of right female breast: Secondary | ICD-10-CM | POA: Diagnosis not present

## 2023-05-25 MED ORDER — FULVESTRANT 250 MG/5ML IM SOSY
500.0000 mg | PREFILLED_SYRINGE | Freq: Once | INTRAMUSCULAR | Status: AC
Start: 2023-05-25 — End: 2023-05-25
  Administered 2023-05-25: 500 mg via INTRAMUSCULAR
  Filled 2023-05-25: qty 10

## 2023-05-25 NOTE — Assessment & Plan Note (Signed)
Originally diagnosed in 1995, patient did not take prescribed tamoxifen. Now presenting with metastatic disease based on scans done 03/11/2020 CT CAP: Numerous lytic lesions in the thoracic spine several with expansion extraosseous extension. 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  Uncertain if any radiation was given to the skull lesions.   Current Treatment:   letrozole. Started December 2021, discontinued for progression and switched to Faslodex 10/15/2022   Poor nutritional status: Since the tube feedings have started her weight is improving.  Energy levels are improving and she is got a better positive outlook.    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  No evidence of visceral metastatic disease. CT CAP 04/15/2021: No substantial interval change similar bone metastases CT CAP: 10/16/21: Enlarging Rt Subpectoral/ Axill LN, stable bone mets. PET CT scan 01/10/2022: Hypermetabolic right subpectoral/axillary lymph node, stable bone metastases CT CAP 10/08/2022: Slight interval enlargement of the right subpectoral lymph node 6 cm (was 4.8 cm) stable bone metastases. CT CAP 12/30/2022: No significant interval change in the bone metastases.  Stable bulky subpectoral lymph nodes. CT CAP 04/29/2023: No significant interval change in the bone metastasis, no change in the bulky right subpectoral lymph nodes, new hypodense foci in the cecum (?  Ingested material or tablets)   Faslodex toxicities: Fatigue Patient expressed concern that the Faslodex is manufactured in [place] and she does not want to receive any treatment meant that was manufactured in [place].  We could not find any alternate producers.  Laqueta Linden has been ordered and results are pending.

## 2023-05-25 NOTE — Progress Notes (Signed)
Oh with  Patient Care Team: Agapito Games, MD as PCP - General (Family Medicine) Serena Croissant, MD as Consulting Physician (Hematology and Oncology)  DIAGNOSIS:  Encounter Diagnosis  Name Primary?   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  Moved to [facility] December 2021 to be closer to her family   02/2020 -  Anti-estrogen oral therapy   Anastrozole daily   10/21/2020 Treatment Plan Change   Xgeva every 3 months   10/06/2022 Imaging   IMPRESSION: 1. Slight interval enlargement of bulky right subpectoral lymph nodes, consistent with worsened nodal metastatic disease. 2. Multiple unchanged mixed lytic and sclerotic osseous metastatic lesions, notable for a large, expansile lesion of the right second rib as well as numerous additional expansile rib lesions, thoracolumbar vertebral body lesions, and pelvic lesions. 3. No other evidence of metastatic disease in the chest, abdomen, or pelvis. 4. Coronary artery disease. 5. Aortic valve calcifications. Correlate for echocardiographic evidence of aortic valve dysfunction.   Aortic Atherosclerosis (ICD10-I70.0).     Electronically Signed   By: Jearld Lesch M.D.   On: 10/08/2022 07:16   10/15/2022 Cancer Staging   Staging form: Breast, AJCC 8th Edition - Clinical: Stage IV (pM1) - Signed by Loa Socks, NP on 10/15/2022     CHIEF  COMPLIANT:   HISTORY OF PRESENT ILLNESS: Discussed the use of AI scribe software for clinical note transcription with the patient, who gave verbal consent to proceed.  History of Present Illness             ALLERGIES:  is allergic to atorvastatin, bactrim [sulfamethoxazole-trimethoprim], clavulanic acid, guaifenesin & derivatives, and tamoxifen.  MEDICATIONS:  Current Outpatient Medications  Medication Sig Dispense Refill   acetaminophen (TYLENOL) 160 MG/5ML liquid Take 500 mg by mouth every 4 (four) hours as needed for fever.     albuterol (VENTOLIN HFA) 108 (90 Base) MCG/ACT inhaler Inhale 2 puffs into the lungs every 6 (six) hours as needed for wheezing. 8.5 g 11   LORazepam (ATIVAN) 0.5 MG tablet Take 1 tablet 1 hour before scan- may repeat at time of scan if needed. 2 tablet 0   Spacer/Aero-Holding Chambers (AEROCHAMBER PLUS WITH MASK) inhaler Use with inhaler 1 each 3   No current facility-administered medications for this visit.    PHYSICAL EXAMINATION: ECOG PERFORMANCE STATUS: 1 - Symptomatic but completely ambulatory  Vitals:   05/25/23 1132  BP: (!) 100/56  Pulse: 87  Resp: 17  Temp: (!) 100.4 F (38 C)  SpO2: 100%   Filed Weights   05/25/23 1132  Weight: 140 lb (63.5 kg)    Physical Exam          (exam performed in the presence of a chaperone)  LABORATORY DATA:  I have reviewed the data as listed    Latest Ref Rng & Units 04/09/2023    2:02 PM 12/17/2022    2:35 PM 10/02/2022   10:47 AM  CMP  Glucose 70 - 99 mg/dL 96  99  147   BUN 8 - 23 mg/dL 32  30  29   Creatinine 0.44 - 1.00 mg/dL 8.29  5.62  1.30   Sodium 135 - 145 mmol/L 139  139  138   Potassium 3.5 - 5.1 mmol/L 4.8  4.9  4.4   Chloride 98 - 111 mmol/L 103  103  104   CO2 22 - 32 mmol/L 31  31  30    Calcium 8.9 - 10.3 mg/dL 9.7  9.5  9.4   Total Protein 6.5 - 8.1 g/dL 7.2  6.9  7.0   Total Bilirubin <1.2 mg/dL 0.7  0.6  0.7   Alkaline Phos 38 - 126 U/L 56  56  58   AST 15 - 41 U/L 31   26  23    ALT 0 - 44 U/L 23  18  16      Lab Results  Component Value Date   WBC 5.6 04/09/2023   HGB 14.8 04/09/2023   HCT 45.3 04/09/2023   MCV 97.2 04/09/2023   PLT 188 04/09/2023   NEUTROABS 4.3 04/09/2023    ASSESSMENT & PLAN:  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 Now presenting with metastatic disease based on scans done 03/11/2020 CT CAP: Numerous lytic lesions in the thoracic spine several with expansion extraosseous extension. 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  Uncertain if any radiation was given to the skull lesions.   Current Treatment:   letrozole. Started December 2021, discontinued for progression and switched to Faslodex 10/15/2022   Poor nutritional status: Since the tube feedings have started her weight is improving.  Energy levels are improving and she is got a better positive outlook.    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  No evidence of visceral metastatic disease. CT CAP 04/15/2021: No substantial interval change similar bone metastases CT CAP: 10/16/21: Enlarging Rt Subpectoral/ Axill LN, stable bone mets. PET CT scan 01/10/2022: Hypermetabolic right subpectoral/axillary lymph node, stable bone metastases CT CAP 10/08/2022: Slight interval enlargement of the right subpectoral lymph node 6 cm (was 4.8 cm) stable bone metastases. CT CAP 12/30/2022: No significant interval change in the bone metastases.  Stable bulky subpectoral lymph nodes. CT CAP 04/29/2023: No significant interval change in the bone metastasis, no change in the bulky right subpectoral lymph nodes, new hypodense foci in the cecum (?  Ingested material or tablets)   Faslodex toxicities:  Fatigue Patient expressed concern that the Faslodex is manufactured in [place] and she does not want to receive any treatment meant that was manufactured in [place].  We could not find any alternate producers.  Guardant360 05/11/2023: PIK3CA mutation, RAF 1 mutation, TMB 21.35, MSI not detected I discussed with her about different options for treatment including Verzinio, Capivasertib or Keytruda immunotherapy. After hearing the risks and benefits of all of these options she decided to continue with the Faslodex injections.  Return to clinic once a month for spasmolytics injections and in 3 months for follow-up with me.  No orders of the defined types were placed in this encounter.  The patient has a good understanding of the overall plan. she agrees with it. she will call with any problems that may develop before the next  visit here. Total time spent: 30 mins including face to face time and time spent for planning, charting and co-ordination of care   Tamsen Meek, MD 05/25/23

## 2023-05-31 ENCOUNTER — Telehealth: Payer: Self-pay

## 2023-05-31 NOTE — Telephone Encounter (Signed)
Pt scheduled for zoladex inj 2/28. Message sent to scheduling to facilitate further appts.

## 2023-05-31 NOTE — Telephone Encounter (Signed)
Pt called to have injection appt for faslodex scheduled. She accepted an appt for 06/25/23 at 1200. This is the latest appt she can have d/t transportation. Her daughter is not available in the afternoons.

## 2023-06-02 ENCOUNTER — Telehealth: Payer: Self-pay | Admitting: Hematology and Oncology

## 2023-06-02 NOTE — Telephone Encounter (Signed)
 Called no answer, left message to schedeule furture appointments.

## 2023-06-02 NOTE — Telephone Encounter (Signed)
 Also contacted patients daughter, she was unavailable to schedule appointments. Message received specifically stated to speak with daughter as she is the patient's transporter.

## 2023-06-03 LAB — GUARDANT 360

## 2023-06-04 ENCOUNTER — Telehealth: Payer: Self-pay | Admitting: Hematology and Oncology

## 2023-06-04 NOTE — Telephone Encounter (Signed)
 Jodi Kaufman

## 2023-06-25 ENCOUNTER — Inpatient Hospital Stay: Payer: Medicare Other | Attending: Hematology and Oncology

## 2023-06-25 VITALS — BP 105/86 | HR 96 | Resp 18

## 2023-06-25 DIAGNOSIS — Z170 Estrogen receptor positive status [ER+]: Secondary | ICD-10-CM | POA: Diagnosis not present

## 2023-06-25 DIAGNOSIS — Z5111 Encounter for antineoplastic chemotherapy: Secondary | ICD-10-CM | POA: Diagnosis present

## 2023-06-25 DIAGNOSIS — C50911 Malignant neoplasm of unspecified site of right female breast: Secondary | ICD-10-CM | POA: Insufficient documentation

## 2023-06-25 DIAGNOSIS — C7951 Secondary malignant neoplasm of bone: Secondary | ICD-10-CM | POA: Diagnosis present

## 2023-06-25 MED ORDER — FULVESTRANT 250 MG/5ML IM SOSY
500.0000 mg | PREFILLED_SYRINGE | Freq: Once | INTRAMUSCULAR | Status: AC
  Administered 2023-06-25: 500 mg via INTRAMUSCULAR

## 2023-07-16 ENCOUNTER — Telehealth: Payer: Self-pay | Admitting: *Deleted

## 2023-07-16 NOTE — Telephone Encounter (Signed)
 Received VM from pt with complaint of side effects related to Faslodex injection and requesting tx plan change.  RN attempt x1 to return call. No answer, unable to LVM due to VM being full.

## 2023-07-22 ENCOUNTER — Inpatient Hospital Stay: Attending: Hematology and Oncology | Admitting: Hematology and Oncology

## 2023-07-22 ENCOUNTER — Other Ambulatory Visit: Payer: Self-pay | Admitting: *Deleted

## 2023-07-22 ENCOUNTER — Inpatient Hospital Stay

## 2023-07-22 ENCOUNTER — Telehealth: Payer: Self-pay | Admitting: Hematology and Oncology

## 2023-07-22 VITALS — BP 108/74 | HR 87 | Temp 98.0°F | Resp 17 | Ht 66.0 in | Wt 138.2 lb

## 2023-07-22 DIAGNOSIS — E559 Vitamin D deficiency, unspecified: Secondary | ICD-10-CM | POA: Insufficient documentation

## 2023-07-22 DIAGNOSIS — Z170 Estrogen receptor positive status [ER+]: Secondary | ICD-10-CM | POA: Insufficient documentation

## 2023-07-22 DIAGNOSIS — C50919 Malignant neoplasm of unspecified site of unspecified female breast: Secondary | ICD-10-CM

## 2023-07-22 DIAGNOSIS — E638 Other specified nutritional deficiencies: Secondary | ICD-10-CM | POA: Diagnosis not present

## 2023-07-22 DIAGNOSIS — Z79818 Long term (current) use of other agents affecting estrogen receptors and estrogen levels: Secondary | ICD-10-CM | POA: Insufficient documentation

## 2023-07-22 DIAGNOSIS — C50911 Malignant neoplasm of unspecified site of right female breast: Secondary | ICD-10-CM | POA: Diagnosis present

## 2023-07-22 DIAGNOSIS — C7951 Secondary malignant neoplasm of bone: Secondary | ICD-10-CM | POA: Insufficient documentation

## 2023-07-22 DIAGNOSIS — Z931 Gastrostomy status: Secondary | ICD-10-CM | POA: Insufficient documentation

## 2023-07-22 DIAGNOSIS — R42 Dizziness and giddiness: Secondary | ICD-10-CM | POA: Insufficient documentation

## 2023-07-22 DIAGNOSIS — Z5111 Encounter for antineoplastic chemotherapy: Secondary | ICD-10-CM | POA: Diagnosis present

## 2023-07-22 DIAGNOSIS — C771 Secondary and unspecified malignant neoplasm of intrathoracic lymph nodes: Secondary | ICD-10-CM | POA: Diagnosis not present

## 2023-07-22 DIAGNOSIS — Z79811 Long term (current) use of aromatase inhibitors: Secondary | ICD-10-CM | POA: Diagnosis not present

## 2023-07-22 DIAGNOSIS — R5383 Other fatigue: Secondary | ICD-10-CM | POA: Diagnosis not present

## 2023-07-22 DIAGNOSIS — Z1722 Progesterone receptor negative status: Secondary | ICD-10-CM | POA: Diagnosis not present

## 2023-07-22 DIAGNOSIS — C7931 Secondary malignant neoplasm of brain: Secondary | ICD-10-CM | POA: Diagnosis not present

## 2023-07-22 DIAGNOSIS — C50411 Malignant neoplasm of upper-outer quadrant of right female breast: Secondary | ICD-10-CM | POA: Insufficient documentation

## 2023-07-22 DIAGNOSIS — Z923 Personal history of irradiation: Secondary | ICD-10-CM | POA: Diagnosis not present

## 2023-07-22 DIAGNOSIS — M25552 Pain in left hip: Secondary | ICD-10-CM | POA: Diagnosis not present

## 2023-07-22 DIAGNOSIS — Z9011 Acquired absence of right breast and nipple: Secondary | ICD-10-CM | POA: Diagnosis not present

## 2023-07-22 DIAGNOSIS — Z1732 Human epidermal growth factor receptor 2 negative status: Secondary | ICD-10-CM | POA: Diagnosis not present

## 2023-07-22 LAB — CMP (CANCER CENTER ONLY)
ALT: 19 U/L (ref 0–44)
AST: 25 U/L (ref 15–41)
Albumin: 4.1 g/dL (ref 3.5–5.0)
Alkaline Phosphatase: 60 U/L (ref 38–126)
Anion gap: 5 (ref 5–15)
BUN: 31 mg/dL — ABNORMAL HIGH (ref 8–23)
CO2: 31 mmol/L (ref 22–32)
Calcium: 9.5 mg/dL (ref 8.9–10.3)
Chloride: 106 mmol/L (ref 98–111)
Creatinine: 0.83 mg/dL (ref 0.44–1.00)
GFR, Estimated: >60 mL/min (ref >60)
Glucose, Bld: 99 mg/dL (ref 70–99)
Potassium: 4.5 mmol/L (ref 3.5–5.1)
Sodium: 142 mmol/L (ref 135–145)
Total Bilirubin: 0.8 mg/dL (ref 0.0–1.2)
Total Protein: 7.1 g/dL (ref 6.5–8.1)

## 2023-07-22 LAB — CBC WITH DIFFERENTIAL (CANCER CENTER ONLY)
Abs Immature Granulocytes: 0.02 10*3/uL (ref 0.00–0.07)
Basophils Absolute: 0 10*3/uL (ref 0.0–0.1)
Basophils Relative: 1 %
Eosinophils Absolute: 0.2 10*3/uL (ref 0.0–0.5)
Eosinophils Relative: 4 %
HCT: 43.9 % (ref 36.0–46.0)
Hemoglobin: 14.4 g/dL (ref 12.0–15.0)
Immature Granulocytes: 0 %
Lymphocytes Relative: 9 %
Lymphs Abs: 0.6 10*3/uL — ABNORMAL LOW (ref 0.7–4.0)
MCH: 31.5 pg (ref 26.0–34.0)
MCHC: 32.8 g/dL (ref 30.0–36.0)
MCV: 96.1 fL (ref 80.0–100.0)
Monocytes Absolute: 0.6 10*3/uL (ref 0.1–1.0)
Monocytes Relative: 10 %
Neutro Abs: 4.8 10*3/uL (ref 1.7–7.7)
Neutrophils Relative %: 76 %
Platelet Count: 183 10*3/uL (ref 150–400)
RBC: 4.57 MIL/uL (ref 3.87–5.11)
RDW: 13.6 % (ref 11.5–15.5)
WBC Count: 6.3 10*3/uL (ref 4.0–10.5)
nRBC: 0 % (ref 0.0–0.2)

## 2023-07-22 MED ORDER — ERGOCALCIFEROL 1.25 MG (50000 UT) PO CAPS: 50000.0000 [IU] | ORAL_CAPSULE | ORAL | 3 refills | Status: DC

## 2023-07-22 MED ORDER — DENOSUMAB 120 MG/1.7ML ~~LOC~~ SOLN
120.0000 mg | Freq: Once | SUBCUTANEOUS | Status: AC
  Administered 2023-07-22: 120 mg via SUBCUTANEOUS
  Filled 2023-07-22: qty 1.7

## 2023-07-22 MED ORDER — FULVESTRANT 250 MG/5ML IM SOSY
500.0000 mg | PREFILLED_SYRINGE | Freq: Once | INTRAMUSCULAR | Status: AC
  Administered 2023-07-22: 500 mg via INTRAMUSCULAR
  Filled 2023-07-22: qty 10

## 2023-07-22 NOTE — Progress Notes (Signed)
 Patient Care Team: Agapito Games, MD as PCP - General (Family Medicine) Serena Croissant, MD as Consulting Physician (Hematology and Oncology)  DIAGNOSIS:  Encounter Diagnoses  Name Primary?   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  Moved to [facility] December 2021 to be closer to her family   02/2020 -  Anti-estrogen oral therapy   Anastrozole daily   10/21/2020 Treatment Plan Change   Xgeva every 3 months   10/06/2022 Imaging   IMPRESSION: 1. Slight interval enlargement of bulky right subpectoral lymph nodes, consistent with worsened nodal metastatic disease. 2. Multiple unchanged mixed lytic and sclerotic osseous metastatic lesions, notable for a large, expansile lesion of the right second rib as well as numerous additional expansile rib lesions, thoracolumbar vertebral body lesions, and pelvic lesions. 3. No other evidence of metastatic disease in the chest, abdomen, or pelvis. 4. Coronary artery disease. 5. Aortic valve calcifications. Correlate for echocardiographic evidence of aortic valve dysfunction.   Aortic Atherosclerosis (ICD10-I70.0).     Electronically Signed   By: Jearld Lesch M.D.   On: 10/08/2022 07:16   10/15/2022 Cancer Staging   Staging form: Breast, AJCC 8th Edition -  Clinical: Stage IV (pM1) - Signed by Loa Socks, NP on 10/15/2022     CHIEF COMPLIANT: Complains of profound fatigue, stiffness in her legs especially bilateral knees, intermittent dizziness when she twists her neck in a certain direction  HISTORY OF PRESENT ILLNESS:   History of Present Illness The patient, with a history of menopause, presents with multiple complaints. She reports experiencing dizziness, particularly when turning around quickly, which causes her to feel unsteady and at risk of falling. She also describes stiffness in her legs, particularly noticeable upon waking and before getting out of bed. The stiffness is severe enough that she needs to massage her legs before she can stand. The patient also reports constant fatigue, which she describes as being "mega tired" all the time. She also mentions a dry mouth and expresses concerns about possible dehydration. The patient also reports pain in her left hip, which sometimes extends to the area where she receives injections. She is scheduled for another injection soon. The patient is currently on a treatment regimen, the specifics of which are not mentioned, but she believes it may be causing some of her symptoms. She expresses a desire to change her treatment in the future.     ALLERGIES:  is allergic to atorvastatin, bactrim [sulfamethoxazole-trimethoprim], clavulanic acid, guaifenesin & derivatives, and tamoxifen.  MEDICATIONS:  Current Outpatient Medications  Medication Sig Dispense Refill   ergocalciferol (VITAMIN D2) 1.25 MG (50000 UT) capsule Take 1 capsule (50,000 Units total) by mouth once a week. 12 capsule 3   acetaminophen (TYLENOL) 160 MG/5ML liquid Take 500 mg by mouth every 4 (four) hours as needed for fever.     albuterol (  VENTOLIN HFA) 108 (90 Base) MCG/ACT inhaler Inhale 2 puffs into the lungs every 6 (six) hours as needed for wheezing. 8.5 g 11   LORazepam (ATIVAN) 0.5 MG tablet Take 1 tablet 1 hour  before scan- may repeat at time of scan if needed. 2 tablet 0   Spacer/Aero-Holding Chambers (AEROCHAMBER PLUS WITH MASK) inhaler Use with inhaler 1 each 3   No current facility-administered medications for this visit.    PHYSICAL EXAMINATION: ECOG PERFORMANCE STATUS: 1 - Symptomatic but completely ambulatory  Vitals:   07/22/23 1018  BP: 108/74  Pulse: 87  Resp: 17  Temp: 98 F (36.7 C)  SpO2: 94%   Filed Weights   07/22/23 1018  Weight: 138 lb 3.2 oz (62.7 kg)      LABORATORY DATA:  I have reviewed the data as listed    Latest Ref Rng & Units 07/22/2023   10:51 AM 04/09/2023    2:02 PM 12/17/2022    2:35 PM  CMP  Glucose 70 - 99 mg/dL 99  96  99   BUN 8 - 23 mg/dL 31  32  30   Creatinine 0.44 - 1.00 mg/dL 5.62  1.30  8.65   Sodium 135 - 145 mmol/L 142  139  139   Potassium 3.5 - 5.1 mmol/L 4.5  4.8  4.9   Chloride 98 - 111 mmol/L 106  103  103   CO2 22 - 32 mmol/L 31  31  31    Calcium 8.9 - 10.3 mg/dL 9.5  9.7  9.5   Total Protein 6.5 - 8.1 g/dL 7.1  7.2  6.9   Total Bilirubin 0.0 - 1.2 mg/dL 0.8  0.7  0.6   Alkaline Phos 38 - 126 U/L 60  56  56   AST 15 - 41 U/L 25  31  26    ALT 0 - 44 U/L 19  23  18      Lab Results  Component Value Date   WBC 6.3 07/22/2023   HGB 14.4 07/22/2023   HCT 43.9 07/22/2023   MCV 96.1 07/22/2023   PLT 183 07/22/2023   NEUTROABS 4.8 07/22/2023    ASSESSMENT & PLAN:  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 Now presenting with metastatic disease based on scans done 03/11/2020 CT CAP: Numerous lytic lesions in the thoracic spine several with expansion extraosseous extension. 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  Uncertain if any radiation was given to the skull  lesions.   Current Treatment:   letrozole. Started December 2021, discontinued for progression and switched to Faslodex 10/15/2022   Poor nutritional status: Since the tube feedings have started her weight is improving.  Energy levels are improving and she is got a better positive outlook.    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  No evidence of visceral metastatic disease. CT CAP 04/15/2021: No substantial interval change similar bone metastases CT CAP: 10/16/21: Enlarging Rt Subpectoral/ Axill LN, stable bone mets. PET CT scan 01/10/2022: Hypermetabolic right subpectoral/axillary lymph node, stable bone metastases CT CAP 10/08/2022: Slight interval enlargement of the right subpectoral lymph node 6 cm (was 4.8 cm) stable bone metastases. CT CAP 12/30/2022: No significant interval change in the bone metastases.  Stable bulky subpectoral lymph nodes. CT CAP  04/29/2023: No significant interval change in the bone metastasis, no change in the bulky right subpectoral lymph nodes, new hypodense foci in the cecum (?  Ingested material or tablets)   Faslodex toxicities: Fatigue Patient expressed concern that the Faslodex is manufactured in [place] and she does not want to receive any treatment meant that was manufactured in [place].  We could not find any alternate producers.   Guardant360 05/11/2023: PIK3CA mutation, RAF 1 mutation, TMB 21.35, MSI not detected I had previously discussed with her about different options for treatment including Verzinio, Capivasertib or Keytruda immunotherapy.   For the time being we will continue on Faslodex injections.  Profound fatigue: I encouraged her to take 50,000 units of vitamin D weekly.  Will schedule her for scans to be done in 1 month.  We will see her after that to discuss results. ------------------------------------- Assessment and Plan Assessment & Plan Vitamin D deficiency Symptoms of dizziness, stiff legs, and  fatigue may be related to vitamin D deficiency due to being housebound during the winter. - Prescribe vitamin D 50,000 units once a week. - Monitor symptoms for improvement over the next couple of months.  Dizziness Reports dizziness, particularly when turning quickly, raising concern for potential falls. Possible causes include dehydration, inner ear issues, and sinus drainage. - Encourage increased water intake to address potential dehydration. - Monitor for changes in dizziness with hydration and vitamin D supplementation.  Stiff legs Experiences stiffness in legs, particularly in the morning, possibly related to menopause and lack of estrogen. - Encourage walking and stretching exercises to improve mobility and reduce stiffness. - Consider estrogen therapy if symptoms persist.  Left hip pain Reports pain in left hip, sometimes extending to the injection site. This symptom will be evaluated with an upcoming CT scan. - Schedule a CT scan to evaluate left hip pain. - Monitor pain and consider alternative treatment options if necessary.  Follow-up Coordination of CT scan and injection appointments is discussed to minimize inconvenience. - Schedule CT scan a week before the injection on April 30. - Move injection appointment to April 30. - Perform lab work prior to CT scan.      Orders Placed This Encounter  Procedures   CT CHEST ABDOMEN PELVIS W CONTRAST    Standing Status:   Future    Expected Date:   08/13/2023    Expiration Date:   07/21/2024    If indicated for the ordered procedure, I authorize the administration of contrast media per Radiology protocol:   Yes    Does the patient have a contrast media/X-ray dye allergy?:   No    Preferred imaging location?:   [facility]    Release to patient:   Immediate    If indicated for the ordered procedure, I authorize the administration of oral contrast media per Radiology protocol:   Yes   The patient has a good  understanding of the overall plan. she agrees with it. she will call with any problems that may develop before the next visit here. Total time spent: 30 mins including face to face time and time spent for planning, charting and co-ordination of care   Tamsen Meek, MD 07/22/23

## 2023-07-22 NOTE — Telephone Encounter (Signed)
 Rescheduled appointments per 3/27 los. Called and left VM for the patients daughter Chyrl Civatte with appointment details.

## 2023-07-22 NOTE — Assessment & Plan Note (Signed)
 Originally diagnosed in 1995, patient did not take prescribed tamoxifen. Now presenting with metastatic disease based on scans done 03/11/2020 CT CAP: Numerous lytic lesions in the thoracic spine several with expansion extraosseous extension. 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  Uncertain if any radiation was given to the skull lesions.   Current Treatment:   letrozole. Started December 2021, discontinued for progression and switched to Faslodex 10/15/2022   Poor nutritional status: Since the tube feedings have started her weight is improving.  Energy levels are improving and she is got a better positive outlook.    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  No evidence of visceral metastatic disease. CT CAP 04/15/2021: No substantial interval change similar bone metastases CT CAP: 10/16/21: Enlarging Rt Subpectoral/ Axill LN, stable bone mets. PET CT scan 01/10/2022: Hypermetabolic right subpectoral/axillary lymph node, stable bone metastases CT CAP 10/08/2022: Slight interval enlargement of the right subpectoral lymph node 6 cm (was 4.8 cm) stable bone metastases. CT CAP 12/30/2022: No significant interval change in the bone metastases.  Stable bulky subpectoral lymph nodes. CT CAP 04/29/2023: No significant interval change in the bone metastasis, no change in the bulky right subpectoral lymph nodes, new hypodense foci in the cecum (?  Ingested material or tablets)   Faslodex toxicities: Fatigue Patient expressed concern that the Faslodex is manufactured in [place] and she does not want to receive any treatment meant that was manufactured in [place].  We could not find any alternate producers.   Guardant360 05/11/2023: PIK3CA mutation, RAF 1 mutation, TMB 21.35, MSI not detected I discussed  with her about different options for treatment including Verzinio, Capivasertib or Keytruda immunotherapy.  We also discussed going back to antiestrogen therapy with anastrozole

## 2023-07-23 ENCOUNTER — Other Ambulatory Visit: Payer: Self-pay | Admitting: Pharmacist

## 2023-07-23 ENCOUNTER — Inpatient Hospital Stay: Payer: Medicare Other | Attending: Hematology and Oncology

## 2023-07-23 ENCOUNTER — Other Ambulatory Visit (HOSPITAL_COMMUNITY): Payer: Self-pay

## 2023-07-23 ENCOUNTER — Encounter: Payer: Self-pay | Admitting: Hematology and Oncology

## 2023-07-23 LAB — CANCER ANTIGEN 27.29: CA 27.29: 427.7 U/mL — ABNORMAL HIGH (ref 0.0–38.6)

## 2023-07-23 MED ORDER — ERGOCALCIFEROL 1.25 MG (50000 UT) PO CAPS
50000.0000 [IU] | ORAL_CAPSULE | ORAL | 3 refills | Status: DC
  Filled 2023-07-23: qty 12, 84d supply, fill #0

## 2023-07-26 ENCOUNTER — Telehealth: Payer: Self-pay

## 2023-07-26 NOTE — Telephone Encounter (Signed)
-----   Message from Anselm Lis sent at 07/23/2023  4:38 PM EDT ----- Regarding: RE: Liquid Vitamin D From what I found, the softgel can be dissolved in water prior to administration via the feeding tube. I updated the prescription to reflect this and sent to [facility]. ----- Message ----- From: Mauri Pole, RN Sent: 07/23/2023   3:58 PM EDT To: Dellis Filbert, LPN; Thu Leisa Lenz, RN; # Subject: Liquid Vitamin D                               Hi John and Dr. Pamelia Hoit.  [place] was prescribed Vitamin D 50,000 one a week in gel capsule form.  She has a peg tube and the pharmacy said she can not administer the gel capsule in PET tube form.  John can you help research as to what she can take?  Jodi Kaufman

## 2023-07-26 NOTE — Telephone Encounter (Signed)
 Pt called back and I explained that per Marylynn Pearson, pharmD, she would need to dissolve the vitamin d gel cap in water before administering via PEG tube. She is reluctant to do this and asks why she needs weekly vit d instead of daily. Education pt on absorption of vitamin D. She will pick up med from phx. She was transferred to central scheduling to schedule her CT.

## 2023-07-26 NOTE — Telephone Encounter (Signed)
 Attempted to call pt and daughtrer [name], VM box full, called daughter Victorino Dike and LVM for call back to give instructions.

## 2023-08-02 ENCOUNTER — Other Ambulatory Visit (HOSPITAL_COMMUNITY): Payer: Self-pay

## 2023-08-10 ENCOUNTER — Other Ambulatory Visit: Payer: Self-pay | Admitting: *Deleted

## 2023-08-10 MED ORDER — LORAZEPAM 0.5 MG PO TABS: ORAL_TABLET | ORAL | 0 refills | Status: DC

## 2023-08-10 NOTE — Telephone Encounter (Signed)
 Received call from pt requesting prescription for Ativan to take prior to upcoming scans.  Verbal orders received, placed, and called in to pharmacy on file for Ativan 0.5 mg tablet x2.  Pt educated and verbalized understanding.

## 2023-08-12 ENCOUNTER — Ambulatory Visit (HOSPITAL_COMMUNITY)

## 2023-08-16 ENCOUNTER — Telehealth: Payer: Self-pay

## 2023-08-16 NOTE — Telephone Encounter (Signed)
 Received VM from pt requesting call back to confirm date/time of next appt. Attempted to call pt, no answer and VM box is full.

## 2023-08-18 ENCOUNTER — Ambulatory Visit (HOSPITAL_COMMUNITY)
Admission: RE | Admit: 2023-08-18 | Discharge: 2023-08-18 | Disposition: A | Discharge status: Home / Self Care | Source: Ambulatory Visit | Attending: Hematology and Oncology | Admitting: Hematology and Oncology

## 2023-08-18 DIAGNOSIS — Z170 Estrogen receptor positive status [ER+]: Secondary | ICD-10-CM | POA: Diagnosis present

## 2023-08-18 DIAGNOSIS — C50919 Malignant neoplasm of unspecified site of unspecified female breast: Secondary | ICD-10-CM | POA: Diagnosis present

## 2023-08-18 DIAGNOSIS — C50411 Malignant neoplasm of upper-outer quadrant of right female breast: Secondary | ICD-10-CM | POA: Insufficient documentation

## 2023-08-18 MED ORDER — IOHEXOL 300 MG/ML  SOLN
100.0000 mL | Freq: Once | INTRAMUSCULAR | Status: AC | PRN
  Administered 2023-08-18: 100 mL via INTRAVENOUS

## 2023-08-20 ENCOUNTER — Other Ambulatory Visit: Payer: Self-pay | Admitting: *Deleted

## 2023-08-20 ENCOUNTER — Inpatient Hospital Stay: Payer: Medicare Other | Attending: Hematology and Oncology

## 2023-08-20 ENCOUNTER — Encounter: Payer: Self-pay | Admitting: *Deleted

## 2023-08-20 DIAGNOSIS — Z431 Encounter for attention to gastrostomy: Secondary | ICD-10-CM

## 2023-08-20 DIAGNOSIS — C7951 Secondary malignant neoplasm of bone: Secondary | ICD-10-CM

## 2023-08-20 NOTE — Progress Notes (Signed)
 Received message from IR team satiating an Enfit G-Tube will be placed 08/31/23.  This G-Tube is different than what she currently has and will need to be set up with our nutrition team as well as home health.  Pt was established with [facility] in the past and RN successfully faxed referral to [facility].  RN also placed referral to [facility] nutrition team.

## 2023-08-20 NOTE — Progress Notes (Signed)
 Received call from pt stating her PEG tube is leaking and would like for it to be evaluated and possibly replaced.  Verbal orders received by MD and placed.  Pt notified of IR number to call and schedule.

## 2023-08-23 ENCOUNTER — Telehealth: Payer: Self-pay

## 2023-08-23 ENCOUNTER — Telehealth: Payer: Self-pay | Admitting: Hematology and Oncology

## 2023-08-23 ENCOUNTER — Encounter: Payer: Self-pay | Admitting: Nutrition

## 2023-08-23 NOTE — Telephone Encounter (Signed)
 Left message with daughter about added appt date and time for Jodi Kaufman. [name]  vm was full and was not able to leave message.

## 2023-08-23 NOTE — Telephone Encounter (Signed)
 Extensive conversation with pt per previous phone note. She is in agreement changing her appts to 09/07/23 d/t tube replacement. She understands she has an appt Wednesday at 1430 at [facility] with nutrition. She asked for me to send a MyChart message to her portal so her daughter would see it. Message sent and appt details included.

## 2023-08-23 NOTE — Progress Notes (Signed)
 Nutrition appointment requested by patient to review new ENFit feeding tube care and administration of formula.  I notified [employer] of patient's tube exchange and they will send ENFit supplies.  Offered nutrition appointment on Wednesday, April 30.  Patient decided she would not be able to come after her tube was placed.  Have rescheduled nutrition appointment to Friday, May 2 at 1:45.  Patient agreeable.

## 2023-08-23 NOTE — Telephone Encounter (Signed)
 Pt called and LVM stating the hole in her tube has worsened over the weekend so she called IR to ask for a sooner appt for replacement. They were able to schedule her for Wednesday 4/30 so the pt will need to be r/s for lab MD visit and inj. Attempted to call pt and LVM on daughter [name]'s phone as pt's VM is full.

## 2023-08-24 ENCOUNTER — Other Ambulatory Visit: Payer: Self-pay | Admitting: Radiology

## 2023-08-24 DIAGNOSIS — Z431 Encounter for attention to gastrostomy: Secondary | ICD-10-CM

## 2023-08-24 NOTE — H&P (Signed)
 Chief Complaint: leaking around gastrostomy tube; referred for gastrostomy tube exchange with moderate sedation  Referring Provider(s): Gudena,V  Supervising Physician: Elene Griffes  Patient Status: [facility] - Out-pt  History of Present Illness: Jodi  Kaufman is an 83 y.o. 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  Her pull-through type gastrostomy tube was placed in 2021 and has remained in since.  Due to intermittent leakage around tube which is bothersome to the patient she has now been set up for gastrostomy tube exchange with enfit adaptors under moderate sedation.She presents today for the procedure.   *** Patient is Full Code  Past Medical History:  Diagnosis Date   Breast cancer [facility])     Past Surgical History:  Procedure Laterality Date   BREAST BIOPSY     IR GASTROSTOMY TUBE MOD SED  04/15/2020   IR PATIENT EVAL TECH 0-60 MINS  08/22/2020   IR PATIENT EVAL TECH 0-60 MINS  08/29/2021   IR PATIENT EVAL TECH 0-60 MINS  09/24/2021   IR RADIOLOGIST EVAL & MGMT  11/10/2022   MASTECTOMY     MASTECTOMY W/ SENTINEL NODE BIOPSY     Right. 18 years ago.      Allergies: Atorvastatin, Bactrim  [sulfamethoxazole -trimethoprim ], Clavulanic acid, Guaifenesin & derivatives, and Tamoxifen  Medications: Prior to Admission medications   Medication Sig Start Date End Date Taking? Authorizing Provider  acetaminophen  (TYLENOL ) 160 MG/5ML liquid Take 500 mg by mouth every 4 (four) hours as needed for fever.    [provider]  albuterol  (VENTOLIN  HFA) 108 (90 Base) MCG/ACT inhaler Inhale 2 puffs into the lungs every 6 (six) hours as needed for wheezing. 02/22/23   Josepha Nickels, DO  ergocalciferol  (VITAMIN D2) 1.25 MG (50000 UT) capsule Place 1 capsule (50,000 Units total) into feeding tube once a week. Dissolve softgel capsule in water prior to administration  via feeding tube. Flush well after administration. 07/23/23   Gudena, Vinay, MD  LORazepam  (ATIVAN ) 0.5 MG tablet Take 1 tablet 1 hour before scan- may repeat at time of scan if needed. 08/10/23   Cameron Cea, MD  Spacer/Aero-Holding Chambers (AEROCHAMBER PLUS WITH MASK) inhaler Use with inhaler 02/22/23   Josepha Nickels, DO     Family History  Problem Relation Age of Onset   Heart disease Mother    Anemia Mother        penicous anemia.    Alzheimer's disease Mother     Social History   Socioeconomic History   Marital status: Single    Spouse name: Not on file   Number of children: Not on file   Years of education: Not on file   Highest education level: Not on file  Occupational History   Not on file  Tobacco Use   Smoking status: Never   Smokeless tobacco: Never  Substance and Sexual Activity   Alcohol use: Never    Comment: [place].     Drug use: Never   Sexual activity: Not Currently  Other Topics Concern   Not on file  Social History Narrative   Lives in [place] but stays with her daughter 1-2 weeks out of the month.     Social Drivers of Corporate investment banker Strain: Not on file  Food Insecurity: Not on file  Transportation Needs: Not on file  Physical Activity: Not on file  Stress: Not on file  Social Connections: Not on file  Review of Systems  Vital Signs:   Advance Care Plan: no documents on file  Physical Exam  Imaging: No results found.  Labs:  CBC: Recent Labs    10/02/22 1047 12/17/22 1435 04/09/23 1402 07/22/23 1051  WBC 6.2 4.8 5.6 6.3  HGB 14.2 14.4 14.8 14.4  HCT 43.2 44.0 45.3 43.9  PLT 205 191 188 183    COAGS: No results for input(s): "INR", "APTT" in the last 8760 hours.  BMP: Recent Labs    10/02/22 1047 12/17/22 1435 04/09/23 1402 07/22/23 1051  NA 138 139 139 142  K 4.4 4.9 4.8 4.5  CL 104 103 103 106  CO2 30 31 31 31   GLUCOSE 100* 99 96 99  BUN 29* 30* 32* 31*  CALCIUM 9.4 9.5 9.7 9.5   CREATININE 0.87 0.87 0.84 0.83  GFRNONAA >60 >60 >60 >60    LIVER FUNCTION TESTS: Recent Labs    10/02/22 1047 12/17/22 1435 04/09/23 1402 07/22/23 1051  BILITOT 0.7 0.6 0.7 0.8  AST 23 26 31 25   ALT 16 18 23 19   ALKPHOS 58 56 56 60  PROT 7.0 6.9 7.2 7.1  ALBUMIN 4.0 4.0 4.2 4.1    TUMOR MARKERS: No results for input(s): "AFPTM", "CEA", "CA199", "CHROMGRNA" in the last 8760 hours.  Assessment and Plan: 83 y.o. 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  Her pull-through type gastrostomy tube was placed in 2021 and has remained in since.  Due to intermittent leakage around tube which is bothersome to the patient she has now been set up for gastrostomy tube exchange with enfit adaptors under moderate sedation.She presents today for the procedure.Risks and benefits of procedure was discussed with the patient  including, but not limited to bleeding, infection, damage to adjacent structures .  All of the questions were answered and there is agreement to proceed.  Consent signed and in chart.    Thank you for allowing our service to participate in Briell  Fennimore 's care.  Electronically Signed: D. Honore Lux, PA-C   08/24/2023, 3:53 PM      I spent a total of    15 Minutes in face to face in clinical consultation, greater than 50% of which was counseling/coordinating care for gastrostomy tube exchange

## 2023-08-25 ENCOUNTER — Inpatient Hospital Stay: Payer: Medicare Other | Attending: Hematology and Oncology | Admitting: Hematology and Oncology

## 2023-08-25 ENCOUNTER — Other Ambulatory Visit: Payer: Self-pay

## 2023-08-25 ENCOUNTER — Encounter (HOSPITAL_COMMUNITY): Payer: Self-pay

## 2023-08-25 ENCOUNTER — Inpatient Hospital Stay: Payer: Medicare Other

## 2023-08-25 ENCOUNTER — Ambulatory Visit (HOSPITAL_COMMUNITY)
Admission: RE | Admit: 2023-08-25 | Discharge: 2023-08-25 | Disposition: A | Discharge status: Home / Self Care | Source: Ambulatory Visit | Attending: Hematology and Oncology | Admitting: Hematology and Oncology

## 2023-08-25 ENCOUNTER — Encounter: Admitting: Nutrition

## 2023-08-25 ENCOUNTER — Inpatient Hospital Stay

## 2023-08-25 VITALS — BP 109/53 | HR 85 | Temp 97.9°F | Resp 16 | Wt 135.4 lb

## 2023-08-25 DIAGNOSIS — C7931 Secondary malignant neoplasm of brain: Secondary | ICD-10-CM | POA: Diagnosis not present

## 2023-08-25 DIAGNOSIS — E559 Vitamin D deficiency, unspecified: Secondary | ICD-10-CM | POA: Insufficient documentation

## 2023-08-25 DIAGNOSIS — Z170 Estrogen receptor positive status [ER+]: Secondary | ICD-10-CM | POA: Diagnosis not present

## 2023-08-25 DIAGNOSIS — C7951 Secondary malignant neoplasm of bone: Secondary | ICD-10-CM | POA: Insufficient documentation

## 2023-08-25 DIAGNOSIS — Z853 Personal history of malignant neoplasm of breast: Secondary | ICD-10-CM | POA: Insufficient documentation

## 2023-08-25 DIAGNOSIS — K5790 Diverticulosis of intestine, part unspecified, without perforation or abscess without bleeding: Secondary | ICD-10-CM | POA: Diagnosis not present

## 2023-08-25 DIAGNOSIS — Z431 Encounter for attention to gastrostomy: Secondary | ICD-10-CM

## 2023-08-25 DIAGNOSIS — Z923 Personal history of irradiation: Secondary | ICD-10-CM | POA: Diagnosis not present

## 2023-08-25 DIAGNOSIS — Z79811 Long term (current) use of aromatase inhibitors: Secondary | ICD-10-CM | POA: Insufficient documentation

## 2023-08-25 DIAGNOSIS — C50919 Malignant neoplasm of unspecified site of unspecified female breast: Secondary | ICD-10-CM | POA: Diagnosis not present

## 2023-08-25 DIAGNOSIS — C50911 Malignant neoplasm of unspecified site of right female breast: Secondary | ICD-10-CM | POA: Diagnosis present

## 2023-08-25 DIAGNOSIS — K9423 Gastrostomy malfunction: Secondary | ICD-10-CM | POA: Diagnosis not present

## 2023-08-25 DIAGNOSIS — Z1732 Human epidermal growth factor receptor 2 negative status: Secondary | ICD-10-CM | POA: Diagnosis not present

## 2023-08-25 DIAGNOSIS — Z1722 Progesterone receptor negative status: Secondary | ICD-10-CM | POA: Diagnosis not present

## 2023-08-25 HISTORY — PX: IR REPLC GASTRO/COLONIC TUBE PERCUT W/FLUORO: IMG2333

## 2023-08-25 LAB — CBC
HCT: 36.2 % (ref 36.0–46.0)
Hemoglobin: 11.5 g/dL — ABNORMAL LOW (ref 12.0–15.0)
MCH: 31.6 pg (ref 26.0–34.0)
MCHC: 31.8 g/dL (ref 30.0–36.0)
MCV: 99.5 fL (ref 80.0–100.0)
Platelets: 130 10*3/uL — ABNORMAL LOW (ref 150–400)
RBC: 3.64 MIL/uL — ABNORMAL LOW (ref 3.87–5.11)
RDW: 13.9 % (ref 11.5–15.5)
WBC: 5.6 10*3/uL (ref 4.0–10.5)
nRBC: 0 % (ref 0.0–0.2)

## 2023-08-25 LAB — PROTIME-INR
INR: 1.1 (ref 0.8–1.2)
Prothrombin Time: 13.9 s (ref 11.4–15.2)

## 2023-08-25 LAB — GLUCOSE, CAPILLARY: Glucose-Capillary: 112 mg/dL — ABNORMAL HIGH (ref 70–99)

## 2023-08-25 MED ORDER — LIDOCAINE VISCOUS HCL 2 % MT SOLN
15.0000 mL | Freq: Once | OROMUCOSAL | Status: AC
  Administered 2023-08-25: 5 mL via OROMUCOSAL

## 2023-08-25 MED ORDER — FENTANYL CITRATE (PF) 100 MCG/2ML IJ SOLN
INTRAMUSCULAR | Status: AC | PRN
  Administered 2023-08-25 (×2): 50 ug via INTRAVENOUS

## 2023-08-25 MED ORDER — IOHEXOL 300 MG/ML  SOLN
50.0000 mL | Freq: Once | INTRAMUSCULAR | Status: AC | PRN
  Administered 2023-08-25: 10 mL

## 2023-08-25 MED ORDER — FENTANYL CITRATE (PF) 100 MCG/2ML IJ SOLN
INTRAMUSCULAR | Status: AC
  Filled 2023-08-25: qty 2

## 2023-08-25 MED ORDER — FULVESTRANT 250 MG/5ML IM SOSY
500.0000 mg | PREFILLED_SYRINGE | Freq: Once | INTRAMUSCULAR | Status: DC
  Filled 2023-08-25: qty 10

## 2023-08-25 MED ORDER — SILVER NITRATE-POT NITRATE 75-25 % EX MISC
CUTANEOUS | Status: AC
  Filled 2023-08-25: qty 10

## 2023-08-25 MED ORDER — MIDAZOLAM HCL 2 MG/2ML IJ SOLN
INTRAMUSCULAR | Status: AC | PRN
  Administered 2023-08-25 (×2): 1 mg via INTRAVENOUS

## 2023-08-25 MED ORDER — MIDAZOLAM HCL 2 MG/2ML IJ SOLN
INTRAMUSCULAR | Status: AC
  Filled 2023-08-25: qty 2

## 2023-08-25 MED ORDER — LIDOCAINE VISCOUS HCL 2 % MT SOLN
OROMUCOSAL | Status: AC
  Filled 2023-08-25: qty 15

## 2023-08-25 MED ORDER — SODIUM CHLORIDE 0.9 % IV SOLN: INTRAVENOUS | Status: DC

## 2023-08-25 NOTE — Discharge Instructions (Addendum)
 Discharge Instructions:   Please call Interventional Radiology clinic [phone] with any questions or concerns.  You may remove your dressing and shower tomorrow.  .  Gastrostomy Tube Home Guide, Adult A gastrostomy tube, or G-tube, is a tube that is inserted through the abdomen into the stomach. The tube is used to give feedings and medicines when a person cannot eat and drink enough on his or her own or take medicines by mouth. How to care for the insertion site Washing hands with soap and water. Supplies needed: Saline solution or clean, warm water and soap. Saline solution is made of salt and water. Cotton swab or gauze. Pre-cut gauze bandage (dressing) and tape, if needed. Instructions Follow these steps daily to clean the insertion site: Wash your hands with soap and water for at least 20 seconds. Remove the dressing (if there is one) that is between the person's skin and the tube. Check the area where the tube enters the skin. Check daily for problems such as: Redness, rash, or irritation. Swelling. Pus-like drainage. Extra skin growth. Moisten the cotton swab or gauze with the saline solution or with a soap-and-water mixture. Gently clean around the insertion site. Remove any drainage or crusted material. When the G-tube is first put in, a normal saline solution or water can be used to clean the skin. After the skin around the tube has healed, mild soap and water may be used. Apply a dressing (if there should be one) between the person's skin and the tube. How to flush a G-tube Flush the G-tube regularly to keep it from clogging. Flush it before and after feedings and as often as told by the health care provider. Supplies needed: Purified or germ-free (sterile) water, warmed. Container with lid for boiling water, if needed. 60 cc G-tube syringe. Instructions Before you begin, decide whether to use sterile water or purified drinking water. Use only sterile water  if: The person has a weak disease-fighting (immune) system. The person has trouble fighting off infections (is immunocompromised). You are unsure about the amount of chemical contaminants in purified or drinking water. Use purified drinking water in all other cases. To purify drinking water by boiling: Boil water for at least 1 minute. Keep lid over water while it boils. Let water cool to room temperature before using. Follow these steps to flush the G-tube: Wash your hands with soap and water for at least 20 seconds. Bring out (draw up) 30 mL of warm water in a syringe. Connect the syringe to the tube. Slowly and gently push the water into the tube. General tips If the tube comes out: Cover the opening with a clean dressing and tape. Get help right away. If there is skin or scar tissue growing where the tube enters the skin: Keep the area clean and dry. Secure the tube with tape so that the tube does not move around too much. If the tube gets clogged: Slowly push warm water into the tube with a large syringe. Do not force the fluid into the tube or push an object into the tube. Get help right away if you cannot unclog the tube. Follow these instructions at home: Feedings Give feedings at room temperature. If feedings are continuous: Do not put more than 4 hours' worth of feedings in the feeding bag. Stop the feedings when you need to give medicine or flush the tube. Be sure to restart the feedings. Make sure the person's head is above his or her stomach (upright position). This  will prevent choking and discomfort. Make sure the person is in the right position during and after feedings. During feedings, have the person in the upright position. After a non-continuous feeding (bolus feeding), have the person stay in the upright position for 1 hour. Cover and place unused feedings in the refrigerator. Replace feeding bags and syringes as told. Good hygiene Make sure the person takes  good care of his or her mouth and teeth (oral hygiene), such as by brushing his or her teeth. Keep the area where the tube enters the skin clean and dry. General instructions Do not pull or put tension on the tube. Before you remove the tube cap or disconnect a syringe, close the tube by using a clamp (clamping) or bending (kinking) the tube. Measure the length of the G-tube every day from the insertion site to the end of the tube. If the person's G-tube has a balloon, check the fluid in the balloon every week. Check the manufacturer's specifications to find the amount of fluid that should be in the balloon. Remove excess air from the G-tube as told. This is called venting. Do not push feedings, medicines, or flushes fast. Use the feeding tube equipment, such as syringes and connectors, only as told by your health care provider. Contact a health care provider if: The person with the tube has constipation or a fever. A large amount of fluid or mucus-like liquid is leaking from the tube. Skin or scar tissue appears to be growing where the tube enters the skin. The length of tube from the insertion site to the G-tube gets longer. Get help right away if: The person with the tube has any of these problems: Severe pain, tenderness, or bloating in the abdomen. Nausea or vomiting. Trouble breathing or shortness of breath. Any of these problems happen in the area where the tube enters the skin: Redness, irritation, swelling, or soreness. Pus-like discharge. A bad smell. The tube is clogged and cannot be flushed. The tube comes out. The tube will need to be put back in within 4 hours. Summary A gastrostomy tube, or G-tube, is a tube that is inserted through the abdomen into the stomach. The tube is used to give feedings and medicines when a person cannot eat and drink enough on his or her own or cannot take medicine by mouth. Check and clean the insertion site daily as told by the person's health  care provider. Flush the G-tube regularly to keep it from clogging. Flush it before and after feedings and as often as told. Keep the area where the tube enters the skin clean and dry. This information is not intended to replace advice given to you by your health care provider. Make sure you discuss any questions you have with your health care provider. Document Revised: 04/03/2020 Document Reviewed: 08/31/2019 Elsevier Patient Education  2023 Elsevier Inc.     Moderate Conscious Sedation, Adult, Care After   This sheet gives you information about how to care for yourself after your procedure. Your health care provider may also give you more specific instructions. If you have problems or questions, contact your health care provider. What can I expect after the procedure? After the procedure, it is common to have: Sleepiness for several hours. Impaired judgment for several hours. Difficulty with balance. Vomiting if you eat too soon. Follow these instructions at home: For the time period you were told by your health care provider: Rest. Do not participate in activities where you could fall or  become injured. Do not drive or use machinery. Do not drink alcohol. Do not take sleeping pills or medicines that cause drowsiness. Do not make important decisions or sign legal documents. Do not take care of children on your own. Eating and drinking  Follow the diet recommended by your health care provider. Drink enough fluid to keep your urine pale yellow. If you vomit: Drink water, juice, or soup when you can drink without vomiting. Make sure you have little or no nausea before eating solid foods. General instructions Take over-the-counter and prescription medicines only as told by your health care provider. Have a responsible adult stay with you for the time you are told. It is important to have someone help care for you until you are awake and alert. Do not smoke. Keep all follow-up  visits as told by your health care provider. This is important. Contact a health care provider if: You are still sleepy or having trouble with balance after 24 hours. You feel light-headed. You keep feeling nauseous or you keep vomiting. You develop a rash. You have a fever. You have redness or swelling around the IV site. Get help right away if: You have trouble breathing. You have new-onset confusion at home. Summary After the procedure, it is common to feel sleepy, have impaired judgment, or feel nauseous if you eat too soon. Rest after you get home. Know the things you should not do after the procedure. Follow the diet recommended by your health care provider and drink enough fluid to keep your urine pale yellow. Get help right away if you have trouble breathing or new-onset confusion at home. This information is not intended to replace advice given to you by your health care provider. Make sure you discuss any questions you have with your health care provider. Document Revised: 08/11/2019 Document Reviewed: 03/09/2019 Elsevier Patient Education  2023 [employer].

## 2023-08-25 NOTE — Assessment & Plan Note (Signed)
 Originally diagnosed in 1995, patient did not take prescribed tamoxifen. Now presenting with metastatic disease based on scans done 03/11/2020 CT CAP: Numerous lytic lesions in the thoracic spine several with expansion extraosseous extension. 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  Uncertain if any radiation was given to the skull lesions.   Current Treatment:   letrozole . Started December 2021, discontinued for progression and switched to Faslodex  10/15/2022   Poor nutritional status: Since the tube feedings have started her weight is improving.  Energy levels are improving and she is got a better positive outlook.    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  No evidence of visceral metastatic disease. CT CAP 04/15/2021: No substantial interval change similar bone metastases CT CAP: 10/16/21: Enlarging Rt Subpectoral/ Axill LN, stable bone mets. PET CT scan 01/10/2022: Hypermetabolic right subpectoral/axillary lymph node, stable bone metastases CT CAP 10/08/2022: Slight interval enlargement of the right subpectoral lymph node 6 cm (was 4.8 cm) stable bone metastases. CT CAP 12/30/2022: No significant interval change in the bone metastases.  Stable bulky subpectoral lymph nodes. CT CAP 04/29/2023: No significant interval change in the bone metastasis, no change in the bulky right subpectoral lymph nodes, new hypodense foci in the cecum (?  Ingested material or tablets)   Faslodex  toxicities: Fatigue Patient expressed concern that the Faslodex  is manufactured in [place] and she does not want to receive any treatment meant that was manufactured in [place].  We could not find any alternate producers.   Guardant360 05/11/2023: PIK3CA mutation, RAF 1 mutation, TMB 21.35, MSI not detected I had  previously discussed with her about different options for treatment including Verzinio, Capivasertib or Keytruda immunotherapy.   For the time being we will continue on Faslodex  injections.   Profound fatigue: I encouraged her to take 50,000 units of vitamin D  weekly.   CT CAP 08/18/2023:

## 2023-08-25 NOTE — Procedures (Signed)
 Interventional Radiology Procedure:   Indications: Leaking gastrostomy tube  Procedure: Gastrostomy tube exchange  Findings: Pull through tube was completely removed.  New 20 Fr balloon retention tube placed and contrast injection confirmed placement in stomach. Bleeding granulation tissue treated with silver  nitrate sticks.  Complications: None     EBL: Minimal  Plan: New gastrostomy tube is ready to be used.   Cecylia Brazill R. Julietta Ogren, MD  Pager: [phone]

## 2023-08-25 NOTE — Progress Notes (Signed)
 0930 Red skin irrigation with drainage from the G tube insertion site around the outer area. Redness under her left breast with moisture type rash.  Jodi Kaufman in to see and evaluated both.

## 2023-08-25 NOTE — Progress Notes (Unsigned)
 This nurse made several unsuccessful attempts to find patient in the building for injection appt. Verdis Glade, RN notified. Awaiting response for further instruction.

## 2023-08-25 NOTE — Progress Notes (Signed)
 Patient Care Team: Cydney Draft, MD as PCP - General (Family Medicine) Cameron Cea, MD as Consulting Physician (Hematology and Oncology)  DIAGNOSIS:  Encounter Diagnosis  Name Primary?   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  Moved to [facility] December 2021 to be closer to her family   02/2020 -  Anti-estrogen oral therapy   Anastrozole  daily   10/21/2020 Treatment Plan Change   Xgeva  every 3 months   10/06/2022 Imaging   IMPRESSION: 1. Slight interval enlargement of bulky right subpectoral lymph nodes, consistent with worsened nodal metastatic disease. 2. Multiple unchanged mixed lytic and sclerotic osseous metastatic lesions, notable for a large, expansile lesion of the right second rib as well as numerous additional expansile rib lesions, thoracolumbar vertebral body lesions, and pelvic lesions. 3. No other evidence of metastatic disease in the chest, abdomen, or pelvis. 4. Coronary artery disease. 5. Aortic valve calcifications. Correlate for echocardiographic evidence of aortic valve dysfunction.   Aortic Atherosclerosis (ICD10-I70.0).     Electronically Signed   By: Fredricka Jenny M.D.   On: 10/08/2022 07:16   10/15/2022 Cancer Staging   Staging form: Breast, AJCC 8th Edition - Clinical: Stage IV (pM1) - Signed by Percival Brace, NP on 10/15/2022     CHIEF COMPLIANT:  Follow-up of metastatic breast cancer to discuss treatment options  HISTORY OF PRESENT ILLNESS:   History of Present Illness Jodi  Kaufman is an 83 year old female who presents for follow-up regarding her lymph node scan and medication side effects.  She experiences significant fatigue, which she attributes to Faslodex . This fatigue is severe enough to impact her daily activities, and she is considering discontinuing the medication to improve her energy levels.  Her recent scan shows a slight increase in the size of a lymph node under her right chest, from 6.1 mm to 6.4 mm. The rest of her bones remain stable with no significant changes.  She reports poor sleep, having only two hours of sleep the previous night, contributing to her feeling worn out.     ALLERGIES:  is allergic to atorvastatin, bactrim  [sulfamethoxazole -trimethoprim ], clavulanic acid, guaifenesin & derivatives, and tamoxifen.  MEDICATIONS:  Current Outpatient Medications  Medication Sig Dispense Refill   acetaminophen  (TYLENOL ) 160 MG/5ML liquid Take 500 mg by mouth every 4 (four) hours as needed for fever.     albuterol  (VENTOLIN  HFA) 108 (90 Base) MCG/ACT inhaler Inhale 2 puffs into the lungs every 6 (six) hours as needed for wheezing. 8.5 g 11   LORazepam  (ATIVAN ) 0.5 MG tablet Take 1 tablet 1 hour before scan- may repeat at time of scan if needed. 2 tablet 0   Spacer/Aero-Holding Chambers (AEROCHAMBER PLUS WITH MASK) inhaler Use with inhaler 1 each 3   No current facility-administered medications for this visit.   Facility-Administered Medications Ordered in Other Visits  Medication Dose Route Frequency Provider Last Rate Last Admin   0.9 %  sodium chloride  infusion  Intravenous Continuous Pasty Bongo, PA-C 40 mL/hr at 08/25/23 1228 Infusion Verify at 08/25/23 1228   fulvestrant  (FASLODEX ) injection 500 mg  500 mg Intramuscular Once Freddy Spadafora, MD        PHYSICAL EXAMINATION: ECOG PERFORMANCE STATUS:  1 - Symptomatic but completely ambulatory  Vitals:   08/25/23 1334  BP: (!) 109/53  Pulse: 85  Resp: 16  Temp: 97.9 F (36.6 C)  SpO2: 97%   Filed Weights   08/25/23 1334  Weight: 135 lb 6.4 oz (61.4 kg)      LABORATORY DATA:  I have reviewed the data as listed    Latest Ref Rng & Units 07/22/2023   10:51 AM 04/09/2023    2:02 PM 12/17/2022    2:35 PM  CMP  Glucose 70 - 99 mg/dL 99  96  99   BUN 8 - 23 mg/dL 31  32  30   Creatinine 0.44 - 1.00 mg/dL 1.32  4.40  1.02   Sodium 135 - 145 mmol/L 142  139  139   Potassium 3.5 - 5.1 mmol/L 4.5  4.8  4.9   Chloride 98 - 111 mmol/L 106  103  103   CO2 22 - 32 mmol/L 31  31  31    Calcium 8.9 - 10.3 mg/dL 9.5  9.7  9.5   Total Protein 6.5 - 8.1 g/dL 7.1  7.2  6.9   Total Bilirubin 0.0 - 1.2 mg/dL 0.8  0.7  0.6   Alkaline Phos 38 - 126 U/L 60  56  56   AST 15 - 41 U/L 25  31  26    ALT 0 - 44 U/L 19  23  18      Lab Results  Component Value Date   WBC 5.6 08/25/2023   HGB 11.5 (L) 08/25/2023   HCT 36.2 08/25/2023   MCV 99.5 08/25/2023   PLT 130 (L) 08/25/2023   NEUTROABS 4.8 07/22/2023    ASSESSMENT & PLAN:  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 Now presenting with metastatic disease based on scans done 03/11/2020 CT CAP: Numerous lytic lesions in the thoracic spine several with expansion extraosseous extension. 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  Uncertain if any radiation was given to the skull lesions.   Current Treatment:   letrozole . Started December 2021, discontinued for progression and switched to Faslodex  10/15/2022   Poor nutritional status: Since the tube feedings have started her weight is improving.  Energy levels are improving and she is got  a better positive outlook.    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  No evidence of visceral metastatic disease. CT CAP 04/15/2021: No substantial interval change similar bone metastases CT CAP: 10/16/21: Enlarging Rt Subpectoral/ Axill LN, stable bone mets. PET CT scan 01/10/2022: Hypermetabolic right subpectoral/axillary lymph node, stable bone metastases CT CAP 10/08/2022: Slight interval enlargement of the right subpectoral lymph node 6 cm (was 4.8 cm) stable bone metastases. CT CAP 12/30/2022: No significant interval change in the bone metastases.  Stable bulky subpectoral lymph nodes. CT CAP 04/29/2023: No significant interval change in the bone metastasis, no change in the bulky right subpectoral lymph nodes, new hypodense foci in the cecum (?  Ingested material or tablets)   Faslodex  toxicities: Fatigue  Patient expressed concern that the Faslodex  is manufactured in [place] and she does not want to receive any treatment meant that was manufactured in [place].  We could not find any alternate producers.   Guardant360 05/11/2023: PIK3CA mutation, RAF 1 mutation, TMB 21.35, MSI not detected I had previously discussed with her about different options for treatment including Verzinio, Capivasertib or Keytruda immunotherapy.   For the time being we will continue on Faslodex  injections.   Profound fatigue: Could be secondary to infected PEG tube.  Since the PEG tube has been replaced we will see if the fatigue gets better.  Patient thinks that the fatigue is related to Faslodex .  She will receive the Faslodex  on Friday and after that we can decide what to do when she comes back in a month.   CT CAP 08/18/2023: Slight progression of subpectoral lymph node from 6.1 cm to 6.4 cm, stable bone metastases If the patient does not want to continue Faslodex  and we will consider switching her to letrozole  with Verzenio.   Assessment & Plan Metastatic breast cancer Imaging  shows minor lymph node size increase; bone activity stable. She will continue Faslodex  for one month to assess symptom improvement post tube change. - Continue Faslodex  for one month, reassess symptoms. - Schedule follow-up scan in three months to monitor lymph node size and bone activity.  Fatigue Severe fatigue likely due to Faslodex . She agreed to continue medication for one month to evaluate symptom improvement. - Monitor fatigue symptoms over the next month while continuing Faslodex . - Reassess fatigue at next appointment to determine need for medication changes.  Vitamin D  deficiency Prefers crushable or liquid vitamin D  due to difficulty with capsules. Currently on daily vitamin D /K2 supplement. - Continue current vitamin D /K2 supplement. - Consider higher doses of liquid vitamin D  if needed, available at health stores.      No orders of the defined types were placed in this encounter.  The patient has a good understanding of the overall plan. she agrees with it. she will call with any problems that may develop before the next visit here. Total time spent: 45 mins including face to face time and time spent for planning, charting and co-ordination of care   Viinay K Raun Routh, MD 08/25/23

## 2023-08-26 ENCOUNTER — Other Ambulatory Visit (HOSPITAL_COMMUNITY): Payer: Self-pay | Admitting: Diagnostic Radiology

## 2023-08-26 DIAGNOSIS — Z431 Encounter for attention to gastrostomy: Secondary | ICD-10-CM

## 2023-08-27 ENCOUNTER — Inpatient Hospital Stay

## 2023-08-27 ENCOUNTER — Inpatient Hospital Stay: Attending: Hematology and Oncology | Admitting: Nutrition

## 2023-08-27 VITALS — BP 115/71 | HR 95 | Resp 17

## 2023-08-27 DIAGNOSIS — R5383 Other fatigue: Secondary | ICD-10-CM | POA: Diagnosis not present

## 2023-08-27 DIAGNOSIS — Z79818 Long term (current) use of other agents affecting estrogen receptors and estrogen levels: Secondary | ICD-10-CM | POA: Insufficient documentation

## 2023-08-27 DIAGNOSIS — Z923 Personal history of irradiation: Secondary | ICD-10-CM | POA: Insufficient documentation

## 2023-08-27 DIAGNOSIS — C7931 Secondary malignant neoplasm of brain: Secondary | ICD-10-CM | POA: Insufficient documentation

## 2023-08-27 DIAGNOSIS — C7951 Secondary malignant neoplasm of bone: Secondary | ICD-10-CM

## 2023-08-27 DIAGNOSIS — C50911 Malignant neoplasm of unspecified site of right female breast: Secondary | ICD-10-CM | POA: Diagnosis present

## 2023-08-27 DIAGNOSIS — Z5111 Encounter for antineoplastic chemotherapy: Secondary | ICD-10-CM | POA: Insufficient documentation

## 2023-08-27 DIAGNOSIS — Z79811 Long term (current) use of aromatase inhibitors: Secondary | ICD-10-CM | POA: Insufficient documentation

## 2023-08-27 DIAGNOSIS — Z931 Gastrostomy status: Secondary | ICD-10-CM | POA: Diagnosis not present

## 2023-08-27 MED ORDER — FULVESTRANT 250 MG/5ML IM SOSY
500.0000 mg | PREFILLED_SYRINGE | Freq: Once | INTRAMUSCULAR | Status: AC
  Administered 2023-08-27: 500 mg via INTRAMUSCULAR
  Filled 2023-08-27: qty 10

## 2023-08-27 NOTE — Progress Notes (Signed)
 83 year old female diagnosed with metastatic breast cancer and followed by Dr. Lee Public. Receiving Faslodex .  Past medical history includes vitamin D  deficiency, anxiety, diverticulosis.  Medications include Ativan .  Labs were reviewed.  Height: 5 feet 6 inches. Weight: 135 pounds 6.4 ounces on April 30. BMI: 21.85. ECOG: 1.  DME: [employer].  Estimated nutrition needs:  1530-1830 cal, 70-80 g protein, greater than 1.6 L fluid.  Patient's feeding tube was recently replaced.  She now has an ENFit tube and is asking for follow-up on how to use the tube.  Patient noted to have increased fatigue and poor sleep.  She has been using Jevity 1.5 without difficulty.  Her weight has been stable on 5 cartons daily.  Reports her recent 4 pound weight loss is a result of missing some feedings due to testing.  Patient has been mixing medications with water and putting into feeding tube port designed for formula.  She was giving formula directly after medication.  Denies nutrition impact symptoms.  Patient states doctor wants her to eat more.  Nutrition diagnosis: Food and nutrition related knowledge deficit related to feeding tube replacement as evidenced by patient request for additional information.  Intervention: Reviewed proper medication administration using port designed for meds.  Encouraged minimum of 30 mL free water flush before and after medications.  Demonstrated which tube to put formula through and encouraged 30 mL minimum free water flush before and after formula. Continue 5 cartons Jevity 1.5 to provide 1775 cal, 75.6 g protein, 900 mL free water.  Additional 875 mL free water to be given as water flushes.  Tube feeding provides 100% estimated nutrition needs. Patient and daughter both expressed understanding. Reminded them to contact [employer] 1 week before formula runs out. Recommend speech evaluation prior to diet advancement.  Will message MD.  Monitoring, evaluation,  goals: Patient will tolerate tube feeding at goal rate to promote weight maintenance.  Next visit: To be scheduled as needed.  **Disclaimer: This note was dictated with voice recognition software. Similar sounding words can inadvertently be transcribed and this note may contain transcription errors which may not have been corrected upon publication of note.**

## 2023-08-30 ENCOUNTER — Inpatient Hospital Stay

## 2023-08-30 ENCOUNTER — Encounter: Payer: Self-pay | Admitting: Family Medicine

## 2023-08-30 ENCOUNTER — Ambulatory Visit (INDEPENDENT_AMBULATORY_CARE_PROVIDER_SITE_OTHER): Admitting: Family Medicine

## 2023-08-30 VITALS — BP 118/75 | HR 87 | Ht 66.0 in | Wt 137.0 lb

## 2023-08-30 DIAGNOSIS — J019 Acute sinusitis, unspecified: Secondary | ICD-10-CM | POA: Diagnosis not present

## 2023-08-30 DIAGNOSIS — L989 Disorder of the skin and subcutaneous tissue, unspecified: Secondary | ICD-10-CM | POA: Diagnosis not present

## 2023-08-30 DIAGNOSIS — J3489 Other specified disorders of nose and nasal sinuses: Secondary | ICD-10-CM

## 2023-08-30 DIAGNOSIS — R300 Dysuria: Secondary | ICD-10-CM | POA: Diagnosis not present

## 2023-08-30 DIAGNOSIS — Z978 Presence of other specified devices: Secondary | ICD-10-CM

## 2023-08-30 LAB — POCT URINALYSIS DIP (CLINITEK)
Bilirubin, UA: NEGATIVE
Blood, UA: NEGATIVE
Glucose, UA: NEGATIVE mg/dL
Ketones, POC UA: NEGATIVE mg/dL
Nitrite, UA: NEGATIVE
POC PROTEIN,UA: 30 — AB
Spec Grav, UA: 1.02 (ref 1.010–1.025)
Urobilinogen, UA: 2 U/dL — AB
pH, UA: 7.5 (ref 5.0–8.0)

## 2023-08-30 MED ORDER — AMOXICILLIN 875 MG PO TABS: 875.0000 mg | ORAL_TABLET | Freq: Two times a day (BID) | ORAL | 0 refills | Status: DC

## 2023-08-30 NOTE — Progress Notes (Signed)
 Acute Office Visit  Subjective:     Patient ID: Jodi  Kaufman, female    DOB: 1940-12-18, 83 y.o.   MRN: [id number]  Chief Complaint  Patient presents with   Cough   Sinusitis    HPI Patient is in today for lots fo phlegm.  She says since she had her G-tube replaced about a week ago because it had a hole in it, she just had a lot of phlegm and congestion she has had a lot of sinus drainage but she also feels like she is getting a lot of phlegm coming up from her chest as well.  Occasional cough.  No fever or chills.  The notes she has had some dysuria on and off for about 6 months.  She has a lesion under her left eye that is been there for a few months as well it has been bleeding and scabbing over intermittently.  Feels like she is not doing well with her Faslodex  injections.  She feels like they were making her feel lightheaded and dizzy.  She plans on talking with her oncologist about that.  ROS      Objective:    BP 118/75   Pulse 87   Ht 5\' 6"  (1.676 m)   Wt 137 lb (62.1 kg)   SpO2 99%   BMI 22.11 kg/m    Physical Exam Vitals and nursing note reviewed.  Constitutional:      Appearance: Normal appearance.  HENT:     Head: Normocephalic and atraumatic.     Right Ear: Tympanic membrane, ear canal and external ear normal. There is no impacted cerumen.     Left Ear: Tympanic membrane, ear canal and external ear normal. There is no impacted cerumen.     Nose: Nose normal.     Mouth/Throat:     Pharynx: Oropharynx is clear.     Comments: Several missing teeth Eyes:     Conjunctiva/sclera: Conjunctivae normal.  Cardiovascular:     Rate and Rhythm: Normal rate and regular rhythm.  Pulmonary:     Effort: Pulmonary effort is normal.     Breath sounds: Normal breath sounds.  Musculoskeletal:     Cervical back: Neck supple. No tenderness.  Lymphadenopathy:     Cervical: No cervical adenopathy.  Skin:    General: Skin is warm and dry.  Neurological:      Mental Status: She is alert and oriented to person, place, and time.  Psychiatric:        Mood and Affect: Mood normal.     Results for orders placed or performed in visit on 08/30/23  POCT URINALYSIS DIP (CLINITEK)  Result Value Ref Range   Color, UA yellow yellow   Clarity, UA cloudy (A) clear   Glucose, UA negative negative mg/dL   Bilirubin, UA negative negative   Ketones, POC UA negative negative mg/dL   Spec Grav, UA 4.098 1.191 - 1.025   Blood, UA negative negative   pH, UA 7.5 5.0 - 8.0   POC PROTEIN,UA =30 (A) negative, trace   Urobilinogen, UA 2.0 (A) 0.2 or 1.0 E.U./dL   Nitrite, UA Negative Negative   Leukocytes, UA Moderate (2+) (A) Negative        Assessment & Plan:   Problem List Items Addressed This Visit       Other   Uses feeding tube   Other Visit Diagnoses       Dysuria    -  Primary   Relevant Orders  POCT URINALYSIS DIP (CLINITEK) (Completed)   Urine Culture     Sinus drainage         Acute non-recurrent sinusitis, unspecified location       Relevant Medications   amoxicillin  (AMOXIL ) 875 MG tablet     Skin lesion of face       Relevant Orders   Ambulatory referral to Dermatology      Dysuria-will do a urinalysis today we did discuss though that if her urine is more concentrated or she is a little dehydrated that can also cause a burning sensation with urination.  Sinusitis-she has had a lot of thick mucus and drainage.  I do not know if some of this is related to the having had the G-tube change I do wonder if she might be refluxing more but we did discuss putting her on amoxicillin  for sinusitis as well as putting her on a PPI for couple of weeks to reduce any reflux that might be coming from the G-tube.  The skin lesion on her face underneath her left eye is suspicious for squamous cell she says it has been bleeding and scabbing intermittently for a while.  Meds ordered this encounter  Medications   amoxicillin  (AMOXIL ) 875 MG tablet     Sig: Take 1 tablet (875 mg total) by mouth 2 (two) times daily.    Dispense:  14 tablet    Refill:  0    Return if symptoms worsen or fail to improve.  Duaine German, MD

## 2023-08-30 NOTE — Patient Instructions (Signed)
 Pick up Prevacid OTC. Can do this daily for next 2 weeks. Ok to cut capsule open and put in feeding tube.

## 2023-08-30 NOTE — Progress Notes (Signed)
 Nutrition  Patient did not show up for nutrition appointment today.    RD available as needed.  Narcisa Ganesh B. Zollie Hipp, CSO, LDN Registered Dietitian [phone]

## 2023-08-31 ENCOUNTER — Other Ambulatory Visit (HOSPITAL_COMMUNITY)

## 2023-08-31 ENCOUNTER — Telehealth: Payer: Self-pay | Admitting: *Deleted

## 2023-08-31 ENCOUNTER — Ambulatory Visit (HOSPITAL_COMMUNITY)

## 2023-08-31 NOTE — Telephone Encounter (Signed)
Received VM from pt.  RN attempt x1 to return call.  No answer, unable to LVM due to VM being full.

## 2023-09-01 ENCOUNTER — Encounter: Payer: Self-pay | Admitting: Family Medicine

## 2023-09-01 ENCOUNTER — Telehealth: Payer: Self-pay

## 2023-09-01 LAB — URINE CULTURE

## 2023-09-01 NOTE — Telephone Encounter (Signed)
 Copied from CRM [id number]. Topic: Referral - Status >> Sep 01, 2023  2:27 PM Kevelyn M wrote: Reason for CRM: Patient has a spot under her eye and she previously discussed this with Dr. Greer Leak. She is requesting a referral to a dermatologist.

## 2023-09-01 NOTE — Progress Notes (Signed)
 Hi Jodi Kaufman , your urine culture came back negative.

## 2023-09-01 NOTE — Telephone Encounter (Signed)
 Referral was placed in office note from May 5.

## 2023-09-02 ENCOUNTER — Other Ambulatory Visit: Payer: Self-pay

## 2023-09-02 ENCOUNTER — Encounter (HOSPITAL_BASED_OUTPATIENT_CLINIC_OR_DEPARTMENT_OTHER): Payer: Self-pay

## 2023-09-02 ENCOUNTER — Telehealth: Payer: Self-pay | Admitting: Hematology and Oncology

## 2023-09-02 ENCOUNTER — Emergency Department (HOSPITAL_BASED_OUTPATIENT_CLINIC_OR_DEPARTMENT_OTHER): Admitting: Radiology

## 2023-09-02 ENCOUNTER — Emergency Department (HOSPITAL_BASED_OUTPATIENT_CLINIC_OR_DEPARTMENT_OTHER)
Admission: EM | Admit: 2023-09-02 | Discharge: 2023-09-02 | Disposition: A | Discharge status: Home / Self Care | Attending: Emergency Medicine | Admitting: Emergency Medicine

## 2023-09-02 DIAGNOSIS — K9423 Gastrostomy malfunction: Secondary | ICD-10-CM | POA: Diagnosis present

## 2023-09-02 NOTE — ED Notes (Signed)
 Radiology called per EDP request for redo of DG abd so contrast could be used for visualization of G-tube. Appropriate orders placed per radiology request

## 2023-09-02 NOTE — ED Triage Notes (Addendum)
 Patient arrives with complaints of PEG tube falling out prior to arrival. Patient had a new tube placed 8 days ago, and she suspects it was not of the best quality.

## 2023-09-02 NOTE — Telephone Encounter (Signed)
 Called to confirmed added appt, no answer mail box full

## 2023-09-02 NOTE — ED Provider Notes (Signed)
 [place] EMERGENCY DEPARTMENT AT [facility] Provider Note   CSN: [id number] Arrival date & time: 09/02/23  1800     History  Chief Complaint  Patient presents with   PEG Tube Issue    Jodi  Kaufman is a 83 y.o. female.  83 year old female who presents after her PEG tube fell out.  PEG tube was placed about 8 or 9 days ago.  States that she had just finished her fees in office and it came out.  No other complaints at this time       Home Medications Prior to Admission medications   Medication Sig Start Date End Date Taking? Authorizing Provider  acetaminophen  (TYLENOL ) 160 MG/5ML liquid Take 500 mg by mouth every 4 (four) hours as needed for fever.    [provider]  albuterol  (VENTOLIN  HFA) 108 (90 Base) MCG/ACT inhaler Inhale 2 puffs into the lungs every 6 (six) hours as needed for wheezing. 02/22/23   Josepha Nickels, DO  amoxicillin  (AMOXIL ) 875 MG tablet Take 1 tablet (875 mg total) by mouth 2 (two) times daily. 08/30/23   Cydney Draft, MD  LORazepam  (ATIVAN ) 0.5 MG tablet Take 1 tablet 1 hour before scan- may repeat at time of scan if needed. 08/10/23   Cameron Cea, MD  Spacer/Aero-Holding Chambers (AEROCHAMBER PLUS WITH MASK) inhaler Use with inhaler 02/22/23   Wachs, Erika S, DO      Allergies    Atorvastatin, Bactrim  [sulfamethoxazole -trimethoprim ], Clavulanic acid, Guaifenesin & derivatives, and Tamoxifen    Review of Systems   Review of Systems  All other systems reviewed and are negative.   Physical Exam Updated Vital Signs BP 124/73 (BP Location: Right Arm)   Pulse 95   Temp 98.7 F (37.1 C) (Temporal)   Resp 20   Ht 1.676 m (5\' 6" )   Wt 62.1 kg   SpO2 100%   BMI 22.10 kg/m  Physical Exam Vitals and nursing note reviewed.  Constitutional:      General: She is not in acute distress.    Appearance: Normal appearance. She is well-developed. She is not toxic-appearing.  HENT:     Head: Normocephalic and atraumatic.   Eyes:     General: Lids are normal.     Conjunctiva/sclera: Conjunctivae normal.     Pupils: Pupils are equal, round, and reactive to light.  Neck:     Thyroid: No thyroid mass.     Trachea: No tracheal deviation.  Cardiovascular:     Rate and Rhythm: Normal rate and regular rhythm.     Heart sounds: Normal heart sounds. No murmur heard.    No gallop.  Pulmonary:     Effort: Pulmonary effort is normal. No respiratory distress.     Breath sounds: Normal breath sounds. No stridor. No decreased breath sounds, wheezing, rhonchi or rales.  Abdominal:     General: There is no distension.     Palpations: Abdomen is soft.     Tenderness: There is no abdominal tenderness. There is no rebound.    Musculoskeletal:        General: No tenderness. Normal range of motion.     Cervical back: Normal range of motion and neck supple.  Skin:    General: Skin is warm and dry.     Findings: No abrasion or rash.  Neurological:     Mental Status: She is alert and oriented to person, place, and time. Mental status is at baseline.     GCS: GCS eye subscore  is 4. GCS verbal subscore is 5. GCS motor subscore is 6.     Cranial Nerves: No cranial nerve deficit.     Sensory: No sensory deficit.     Motor: Motor function is intact.  Psychiatric:        Attention and Perception: Attention normal.        Speech: Speech normal.        Behavior: Behavior normal.     ED Results / Procedures / Treatments   Labs (all labs ordered are listed, but only abnormal results are displayed) Labs Reviewed - No data to display  EKG None  Radiology No results found.  Procedures .Gastrostomy tube replacement  Date/Time: 09/02/2023 6:23 PM  Performed by: Lind Repine, MD Authorized by: Lind Repine, MD  Consent: Verbal consent obtained. Risks and benefits: risks, benefits and alternatives were discussed Consent given by: patient Time out: Immediately prior to procedure a "time out" was called to verify the  correct patient, procedure, equipment, support staff and site/side marked as required. Preparation: Patient was prepped and draped in the usual sterile fashion. Local anesthesia used: no  Anesthesia: Local anesthesia used: no  Sedation: Patient sedated: no  Patient tolerance: patient tolerated the procedure well with no immediate complications       Medications Ordered in ED Medications - No data to display  ED Course/ Medical Decision Making/ A&P                                 Medical Decision Making Amount and/or Complexity of Data Reviewed Radiology: ordered.   I replaced patient's PEG tube and had Gastrografin squirted which shows good position but the balloon is not inflated according to radiology.  I spoke to patient and he stated that he did hear a whooshing noise.  Suspect that the balloon did deflate.  Long discussion with radiologist and plan will be for patient to go to [facility] in the morning and to have IR place a new PEG tube.  SHe was instructed to not use the tube.  Family at bedside and are aware of the plan        Final Clinical Impression(s) / ED Diagnoses Final diagnoses:  None    Rx / DC Orders ED Discharge Orders     None         Lind Repine, MD 09/02/23 2050

## 2023-09-02 NOTE — Discharge Instructions (Signed)
 Do not use the feeding tube.  Call the Department of radiology at [facility] at 8 AM tomorrow morning and ask for interventional radiology.  Tell them that you need to have your feeding tube replaced

## 2023-09-02 NOTE — Telephone Encounter (Signed)
 GM! Called the office, they will be reaching out to pt today to schedule.

## 2023-09-03 ENCOUNTER — Ambulatory Visit (HOSPITAL_COMMUNITY)
Admission: RE | Admit: 2023-09-03 | Discharge: 2023-09-03 | Disposition: A | Discharge status: Home / Self Care | Source: Ambulatory Visit | Attending: Diagnostic Radiology | Admitting: Diagnostic Radiology

## 2023-09-03 ENCOUNTER — Inpatient Hospital Stay (HOSPITAL_COMMUNITY)
Admission: RE | Admit: 2023-09-03 | Discharge: 2023-09-03 | Disposition: A | Discharge status: Home / Self Care | Source: Ambulatory Visit | Attending: Diagnostic Radiology | Admitting: Diagnostic Radiology

## 2023-09-03 ENCOUNTER — Other Ambulatory Visit (HOSPITAL_COMMUNITY): Payer: Self-pay | Admitting: Diagnostic Radiology

## 2023-09-03 DIAGNOSIS — Z431 Encounter for attention to gastrostomy: Secondary | ICD-10-CM | POA: Diagnosis present

## 2023-09-03 MED ORDER — IOHEXOL 300 MG/ML  SOLN
60.0000 mL | Freq: Once | INTRAMUSCULAR | Status: AC | PRN
  Administered 2023-09-03: 60 mL

## 2023-09-03 NOTE — Procedures (Signed)
 Interventional Radiology Procedure:   Indications: Gastrostomy tube was recently dislodged and replaced  Procedure: Gastrostomy tube exchange with fluoroscopy  Findings: New 24 Fr tube placed in stomach.  See procedure note in Imaging.  Complications: None     EBL: Minimal  Plan: Gastrostomy tube is ready to use.   Seraphim Affinito R. Julietta Ogren, MD  Pager: [phone]

## 2023-09-06 ENCOUNTER — Telehealth: Payer: Self-pay | Admitting: Nutrition

## 2023-09-06 NOTE — Telephone Encounter (Signed)
 Patient LM to cancel May 5 appointment.

## 2023-09-07 ENCOUNTER — Ambulatory Visit: Payer: Self-pay

## 2023-09-07 ENCOUNTER — Telehealth: Payer: Self-pay | Admitting: Family Medicine

## 2023-09-07 NOTE — Telephone Encounter (Signed)
 Copied from CRM [id number]. Topic: Clinical - Lab/Test Results >> Sep 07, 2023  2:21 PM [name] wrote: Reason for CRM: Patient called in response to call out from office. Advised patient of lab results and that letter containing results was being mailed to her home.   She would like to hear back from Dr. Greer Leak about results.

## 2023-09-17 ENCOUNTER — Inpatient Hospital Stay: Payer: Medicare Other

## 2023-09-22 ENCOUNTER — Inpatient Hospital Stay: Payer: Medicare Other

## 2023-09-22 ENCOUNTER — Inpatient Hospital Stay (HOSPITAL_BASED_OUTPATIENT_CLINIC_OR_DEPARTMENT_OTHER): Admitting: Hematology and Oncology

## 2023-09-22 VITALS — BP 101/68 | HR 85 | Resp 16

## 2023-09-22 DIAGNOSIS — Z5111 Encounter for antineoplastic chemotherapy: Secondary | ICD-10-CM | POA: Diagnosis not present

## 2023-09-22 DIAGNOSIS — C50919 Malignant neoplasm of unspecified site of unspecified female breast: Secondary | ICD-10-CM

## 2023-09-22 DIAGNOSIS — C7951 Secondary malignant neoplasm of bone: Secondary | ICD-10-CM

## 2023-09-22 MED ORDER — FULVESTRANT 250 MG/5ML IM SOSY
500.0000 mg | PREFILLED_SYRINGE | Freq: Once | INTRAMUSCULAR | Status: AC
  Administered 2023-09-22: 500 mg via INTRAMUSCULAR
  Filled 2023-09-22: qty 10

## 2023-09-22 NOTE — Assessment & Plan Note (Signed)
 Originally diagnosed in 1995, patient did not take prescribed tamoxifen. Now presenting with metastatic disease based on scans done 03/11/2020 CT CAP: Numerous lytic lesions in the thoracic spine several with expansion extraosseous extension. 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  Uncertain if any radiation was given to the skull lesions.   Current Treatment:   letrozole . Started December 2021, discontinued for progression and switched to Faslodex  10/15/2022   Poor nutritional status: Since the tube feedings have started her weight is improving.  Energy levels are improving and she is got a better positive outlook.    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  No evidence of visceral metastatic disease. CT CAP 04/15/2021: No substantial interval change similar bone metastases CT CAP: 10/16/21: Enlarging Rt Subpectoral/ Axill LN, stable bone mets. PET CT scan 01/10/2022: Hypermetabolic right subpectoral/axillary lymph node, stable bone metastases CT CAP 10/08/2022: Slight interval enlargement of the right subpectoral lymph node 6 cm (was 4.8 cm) stable bone metastases. CT CAP 12/30/2022: No significant interval change in the bone metastases.  Stable bulky subpectoral lymph nodes. CT CAP 04/29/2023: No significant interval change in the bone metastasis, no change in the bulky right subpectoral lymph nodes, new hypodense foci in the cecum (?  Ingested material or tablets)   Faslodex  toxicities: Fatigue   Guardant360 05/11/2023: PIK3CA mutation, RAF 1 mutation, TMB 21.35, MSI not detected I had previously discussed with her about different options for treatment including Verzinio, Capivasertib or Keytruda immunotherapy. For the time being we will continue on Faslodex  injections.   CT CAP  08/18/2023: Slight progression of subpectoral lymph node from 6.1 cm to 6.4 cm, stable bone metastases If the patient does not want to continue Faslodex  and we will consider switching her to letrozole  with Verzenio.

## 2023-09-22 NOTE — Progress Notes (Signed)
 Patient Care Team: Cydney Draft, MD as PCP - General (Family Medicine) Cameron Cea, MD as Consulting Physician (Hematology and Oncology)  DIAGNOSIS:  Encounter Diagnosis  Name Primary?   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  Moved to [facility] December 2021 to be closer to her family   02/2020 -  Anti-estrogen oral therapy   Anastrozole  daily   10/21/2020 Treatment Plan Change   Xgeva  every 3 months   10/06/2022 Imaging   IMPRESSION: 1. Slight interval enlargement of bulky right subpectoral lymph nodes, consistent with worsened nodal metastatic disease. 2. Multiple unchanged mixed lytic and sclerotic osseous metastatic lesions, notable for a large, expansile lesion of the right second rib as well as numerous additional expansile rib lesions, thoracolumbar vertebral body lesions, and pelvic lesions. 3. No other evidence of metastatic disease in the chest, abdomen, or pelvis. 4. Coronary artery disease. 5. Aortic valve calcifications. Correlate for echocardiographic evidence of aortic valve dysfunction.   Aortic Atherosclerosis (ICD10-I70.0).     Electronically Signed   By: Fredricka Jenny M.D.   On: 10/08/2022 07:16   10/15/2022 Cancer Staging   Staging form: Breast, AJCC 8th Edition - Clinical: Stage IV (pM1) - Signed by Percival Brace, NP on 10/15/2022     CHIEF COMPLIANT:  Follow-up on Faslodex   HISTORY OF PRESENT ILLNESS:  History of Present Illness Jodi Kaufman is an 83 year old female with metastatic breast cancer who presents with concerns about side effects from Faslodex  injections. She is accompanied by her daughter, Mia Adam.  She experiences significant discomfort from Faslodex  injections, including difficulty walking and increased shakiness. She is concerned about potential cognitive effects and fears cancer progression if treatment is stopped, especially with lymph node involvement. She previously tried letrozole  and anastrozole  before starting Faslodex .  Her PEG tube was replaced approximately two and a half weeks ago after deflating and falling out, requiring an emergency room visit and placement of a larger tube. There is slight leakage, contributing to weight fluctuations from 139 to 134.8 pounds, now at 137 pounds. She is on antibiotics but has difficulty with administration due to PEG tube issues.  Her sleep is disrupted by back problems, leading to poor rest. She uses CBD oil and believes in the benefits of medical marijuana for managing cancer treatment side effects.     ALLERGIES:  is allergic to atorvastatin, bactrim  [sulfamethoxazole -trimethoprim ], clavulanic acid, guaifenesin & derivatives, and tamoxifen.  MEDICATIONS:  Current Outpatient Medications  Medication Sig Dispense Refill   acetaminophen  (TYLENOL ) 160 MG/5ML liquid Take 500 mg by mouth every 4 (four) hours as needed for fever. (Patient not taking: Reported on 09/22/2023)     albuterol  (VENTOLIN  HFA) 108 (90 Base) MCG/ACT inhaler Inhale 2 puffs into the lungs every 6 (six) hours as needed for wheezing. (Patient not taking: Reported on 09/22/2023) 8.5 g 11   amoxicillin  (AMOXIL ) 875 MG tablet Take 1 tablet (875 mg total) by mouth 2 (two) times  daily. (Patient not taking: Reported on 09/22/2023) 14 tablet 0   LORazepam  (ATIVAN ) 0.5 MG tablet Take 1 tablet 1 hour before scan- may repeat  at time of scan if needed. (Patient not taking: Reported on 09/22/2023) 2 tablet 0   Spacer/Aero-Holding Chambers (AEROCHAMBER PLUS WITH MASK) inhaler Use with inhaler (Patient not taking: Reported on 09/22/2023) 1 each 3   No current facility-administered medications for this visit.   Facility-Administered Medications Ordered in Other Visits  Medication Dose Route Frequency Provider Last Rate Last Admin   fulvestrant  (FASLODEX ) injection 500 mg  500 mg Intramuscular Once Tobie Hellen, MD       fulvestrant  (FASLODEX ) injection 500 mg  500 mg Intramuscular Once Morrie Daywalt, MD        PHYSICAL EXAMINATION: ECOG PERFORMANCE STATUS: 1 - Symptomatic but completely ambulatory  There were no vitals filed for this visit. There were no vitals filed for this visit.  Physical Exam MEASUREMENTS: Weight- 137.  (exam performed in the presence of a chaperone)  LABORATORY DATA:  I have reviewed the data as listed    Latest Ref Rng & Units 07/22/2023   10:51 AM 04/09/2023    2:02 PM 12/17/2022    2:35 PM  CMP  Glucose 70 - 99 mg/dL 99  96  99   BUN 8 - 23 mg/dL 31  32  30   Creatinine 0.44 - 1.00 mg/dL 4.54  0.98  1.19   Sodium 135 - 145 mmol/L 142  139  139   Potassium 3.5 - 5.1 mmol/L 4.5  4.8  4.9   Chloride 98 - 111 mmol/L 106  103  103   CO2 22 - 32 mmol/L 31  31  31    Calcium 8.9 - 10.3 mg/dL 9.5  9.7  9.5   Total Protein 6.5 - 8.1 g/dL 7.1  7.2  6.9   Total Bilirubin 0.0 - 1.2 mg/dL 0.8  0.7  0.6   Alkaline Phos 38 - 126 U/L 60  56  56   AST 15 - 41 U/L 25  31  26    ALT 0 - 44 U/L 19  23  18      Lab Results  Component Value Date   WBC 5.6 08/25/2023   HGB 11.5 (L) 08/25/2023   HCT 36.2 08/25/2023   MCV 99.5 08/25/2023   PLT 130 (L) 08/25/2023   NEUTROABS 4.8 07/22/2023    ASSESSMENT & PLAN:  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 Now presenting with metastatic disease based on  scans done 03/11/2020 CT CAP: Numerous lytic lesions in the thoracic spine several with expansion extraosseous extension. 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  Uncertain if any radiation was given to the skull lesions.   Current Treatment:   letrozole . Started December 2021, discontinued for progression and switched to Faslodex  10/15/2022   Poor nutritional status: Since the tube feedings have started her weight is improving.  Energy levels are improving and she is got a better positive outlook.    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  No evidence of visceral metastatic disease. CT CAP 04/15/2021: No substantial interval change similar bone metastases CT CAP: 10/16/21: Enlarging Rt Subpectoral/ Axill LN, stable bone mets. PET CT scan 01/10/2022: Hypermetabolic right subpectoral/axillary  lymph node, stable bone metastases CT CAP 10/08/2022: Slight interval enlargement of the right subpectoral lymph node 6 cm (was 4.8 cm) stable bone metastases. CT CAP 12/30/2022: No significant interval change in the bone metastases.  Stable bulky subpectoral lymph nodes. CT CAP 04/29/2023: No significant interval change in the bone metastasis, no change in the bulky right subpectoral lymph nodes, new hypodense foci in the cecum (?  Ingested material or tablets)   Faslodex  toxicities: Fatigue   Guardant360 05/11/2023: PIK3CA mutation, RAF 1 mutation, TMB 21.35, MSI not detected I had previously discussed with her about different options for treatment including Verzinio, Capivasertib or Keytruda immunotherapy. For the time being we will continue on Faslodex  injections.   CT CAP 08/18/2023: Slight progression of subpectoral lymph node from 6.1 cm to 6.4 cm, stable bone metastases If the patient does not  want to continue Faslodex  and we will consider switching her to letrozole  with Verzenio. She will inform us  in the next couple of weeks about her decision ------------------------------------- Assessment and Plan Assessment & Plan Primary malignant neoplasm of breast with metastasis Breast cancer with metastasis, on Faslodex  with significant side effects. Slight progression in subpectoral lymph node on last CT. Discussed potential switch to Verzenio and Exemestane due to injection intolerance. - Administer Faslodex  injection today. - Discuss potential switch to oral regimen with Verzenio and Exemestane next month. - Monitor for side effects such as diarrhea, fatigue, low blood counts, and potential liver enzyme elevation. - Plan for CT scan in July to assess disease progression. - Consider switching back to injections if oral regimen is not tolerated.  Fatigue Persistent fatigue, possibly exacerbated by cancer treatment. Proposed oral regimen may also contribute. - Monitor fatigue levels with current and potential new treatment regimens. - Discuss potential side effects of Verzenio, including fatigue.  PEG tube complications Complications with PEG tube, replaced with larger size, slight leakage remains a concern. - Monitor PEG tube for leakage and ensure it remains secure. - Ensure proper nutrition and medication administration through the PEG tube.      No orders of the defined types were placed in this encounter.  The patient has a good understanding of the overall plan. she agrees with it. she will call with any problems that may develop before the next visit here. Total time spent: 45 mins including face to face time and time spent for planning, charting and co-ordination of care   Margert Sheerer, MD 09/22/23

## 2023-10-06 ENCOUNTER — Telehealth: Payer: Self-pay | Admitting: Hematology and Oncology

## 2023-10-15 ENCOUNTER — Inpatient Hospital Stay: Payer: Medicare Other

## 2023-10-15 ENCOUNTER — Other Ambulatory Visit

## 2023-10-19 ENCOUNTER — Other Ambulatory Visit: Payer: Self-pay | Admitting: *Deleted

## 2023-10-19 DIAGNOSIS — C7951 Secondary malignant neoplasm of bone: Secondary | ICD-10-CM

## 2023-10-19 NOTE — Progress Notes (Signed)
 Received call from pt requesting to schedule CT CAP.  No orders are in.  RN reviewed with MD and per last office note, repeat CT CAP needing to be obtained in July of 2025.  Orders placed, pt transferred to central scheduling to schedule appt.

## 2023-10-22 ENCOUNTER — Other Ambulatory Visit: Payer: Self-pay

## 2023-10-22 DIAGNOSIS — C7951 Secondary malignant neoplasm of bone: Secondary | ICD-10-CM

## 2023-10-24 NOTE — Assessment & Plan Note (Signed)
 Originally diagnosed in 1995, patient did not take prescribed tamoxifen. Now presenting with metastatic disease based on scans done 03/11/2020 CT CAP: Numerous lytic lesions in the thoracic spine several with expansion extraosseous extension. 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  Uncertain if any radiation was given to the skull lesions.   Current Treatment:   letrozole . Started December 2021, discontinued for progression and switched to Faslodex  10/15/2022   Poor nutritional status: Since the tube feedings have started her weight is improving.  Energy levels are improving and she is got a better positive outlook.    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  No evidence of visceral metastatic disease. CT CAP 04/15/2021: No substantial interval change similar bone metastases CT CAP: 10/16/21: Enlarging Rt Subpectoral/ Axill LN, stable bone mets. PET CT scan 01/10/2022: Hypermetabolic right subpectoral/axillary lymph node, stable bone metastases CT CAP 10/08/2022: Slight interval enlargement of the right subpectoral lymph node 6 cm (was 4.8 cm) stable bone metastases. CT CAP 12/30/2022: No significant interval change in the bone metastases.  Stable bulky subpectoral lymph nodes. CT CAP 04/29/2023: No significant interval change in the bone metastasis, no change in the bulky right subpectoral lymph nodes, new hypodense foci in the cecum (?  Ingested material or tablets)   Faslodex  toxicities: Fatigue   Guardant360 05/11/2023: PIK3CA mutation, RAF 1 mutation, TMB 21.35, MSI not detected I had previously discussed with her about different options for treatment including Verzinio, Capivasertib or Keytruda immunotherapy. For the time being we will continue on Faslodex  injections.    CT CAP  08/18/2023: Slight progression of subpectoral lymph node from 6.1 cm to 6.4 cm, stable bone metastases If the patient does not want to continue Faslodex  and we will consider switching her to letrozole  with Verzenio. She will inform us  in the next couple of weeks about her decision ------------------------------------- Assessment and Plan Assessment & Plan Primary malignant neoplasm of breast with metastasis Breast cancer with metastasis, on Faslodex  with significant side effects. Slight progression in subpectoral lymph node on last CT. Discussed potential switch to Verzenio and Exemestane due to injection intolerance. - Administer Faslodex  injection today. - Discuss potential switch to oral regimen with Verzenio and Exemestane next month. - Monitor for side effects such as diarrhea, fatigue, low blood counts, and potential liver enzyme elevation. - Plan for CT scan in July to assess disease progression. - Consider switching back to injections if oral regimen is not tolerated.   Fatigue Persistent fatigue, possibly exacerbated by cancer treatment. Proposed oral regimen may also contribute. - Monitor fatigue levels with current and potential new treatment regimens. - Discuss potential side effects of Verzenio, including fatigue.   PEG tube complications Complications with PEG tube, replaced with larger size, slight leakage remains a concern. - Monitor PEG tube for leakage and ensure it remains secure. - Ensure proper nutrition and medication administration through the PEG tube.

## 2023-10-25 ENCOUNTER — Inpatient Hospital Stay: Attending: Hematology and Oncology

## 2023-10-25 ENCOUNTER — Inpatient Hospital Stay (HOSPITAL_BASED_OUTPATIENT_CLINIC_OR_DEPARTMENT_OTHER): Admitting: Hematology and Oncology

## 2023-10-25 ENCOUNTER — Inpatient Hospital Stay

## 2023-10-25 VITALS — BP 125/63 | HR 87 | Temp 101.3°F | Resp 18 | Wt 135.7 lb

## 2023-10-25 DIAGNOSIS — C50911 Malignant neoplasm of unspecified site of right female breast: Secondary | ICD-10-CM | POA: Insufficient documentation

## 2023-10-25 DIAGNOSIS — Z5111 Encounter for antineoplastic chemotherapy: Secondary | ICD-10-CM | POA: Diagnosis present

## 2023-10-25 DIAGNOSIS — Z1722 Progesterone receptor negative status: Secondary | ICD-10-CM | POA: Insufficient documentation

## 2023-10-25 DIAGNOSIS — Z1732 Human epidermal growth factor receptor 2 negative status: Secondary | ICD-10-CM | POA: Insufficient documentation

## 2023-10-25 DIAGNOSIS — Z79811 Long term (current) use of aromatase inhibitors: Secondary | ICD-10-CM | POA: Diagnosis not present

## 2023-10-25 DIAGNOSIS — Z170 Estrogen receptor positive status [ER+]: Secondary | ICD-10-CM | POA: Diagnosis not present

## 2023-10-25 DIAGNOSIS — C7951 Secondary malignant neoplasm of bone: Secondary | ICD-10-CM | POA: Diagnosis present

## 2023-10-25 DIAGNOSIS — C7931 Secondary malignant neoplasm of brain: Secondary | ICD-10-CM | POA: Insufficient documentation

## 2023-10-25 DIAGNOSIS — C50919 Malignant neoplasm of unspecified site of unspecified female breast: Secondary | ICD-10-CM | POA: Diagnosis not present

## 2023-10-25 LAB — CBC WITH DIFFERENTIAL (CANCER CENTER ONLY)
Abs Immature Granulocytes: 0.01 10*3/uL (ref 0.00–0.07)
Basophils Absolute: 0 10*3/uL (ref 0.0–0.1)
Basophils Relative: 1 %
Eosinophils Absolute: 0.2 10*3/uL (ref 0.0–0.5)
Eosinophils Relative: 4 %
HCT: 43.1 % (ref 36.0–46.0)
Hemoglobin: 14.5 g/dL (ref 12.0–15.0)
Immature Granulocytes: 0 %
Lymphocytes Relative: 10 %
Lymphs Abs: 0.6 10*3/uL — ABNORMAL LOW (ref 0.7–4.0)
MCH: 31.8 pg (ref 26.0–34.0)
MCHC: 33.6 g/dL (ref 30.0–36.0)
MCV: 94.5 fL (ref 80.0–100.0)
Monocytes Absolute: 0.6 10*3/uL (ref 0.1–1.0)
Monocytes Relative: 11 %
Neutro Abs: 4.1 10*3/uL (ref 1.7–7.7)
Neutrophils Relative %: 74 %
Platelet Count: 176 10*3/uL (ref 150–400)
RBC: 4.56 MIL/uL (ref 3.87–5.11)
RDW: 14 % (ref 11.5–15.5)
WBC Count: 5.5 10*3/uL (ref 4.0–10.5)
nRBC: 0 % (ref 0.0–0.2)

## 2023-10-25 LAB — CMP (CANCER CENTER ONLY)
ALT: 17 U/L (ref 0–44)
AST: 24 U/L (ref 15–41)
Albumin: 4.2 g/dL (ref 3.5–5.0)
Alkaline Phosphatase: 52 U/L (ref 38–126)
Anion gap: 7 (ref 5–15)
BUN: 30 mg/dL — ABNORMAL HIGH (ref 8–23)
CO2: 29 mmol/L (ref 22–32)
Calcium: 9.7 mg/dL (ref 8.9–10.3)
Chloride: 103 mmol/L (ref 98–111)
Creatinine: 0.82 mg/dL (ref 0.44–1.00)
GFR, Estimated: >60 mL/min (ref >60)
Glucose, Bld: 105 mg/dL — ABNORMAL HIGH (ref 70–99)
Potassium: 4.6 mmol/L (ref 3.5–5.1)
Sodium: 139 mmol/L (ref 135–145)
Total Bilirubin: 0.8 mg/dL (ref 0.0–1.2)
Total Protein: 7 g/dL (ref 6.5–8.1)

## 2023-10-25 MED ORDER — LORAZEPAM 0.5 MG PO TABS: ORAL_TABLET | ORAL | 0 refills | Status: DC

## 2023-10-25 MED ORDER — FULVESTRANT 250 MG/5ML IM SOSY
500.0000 mg | PREFILLED_SYRINGE | Freq: Once | INTRAMUSCULAR | Status: AC
  Administered 2023-10-25: 500 mg via INTRAMUSCULAR
  Filled 2023-10-25: qty 10

## 2023-10-25 MED ORDER — DENOSUMAB 120 MG/1.7ML ~~LOC~~ SOLN
120.0000 mg | Freq: Once | SUBCUTANEOUS | Status: AC
  Administered 2023-10-25: 120 mg via SUBCUTANEOUS
  Filled 2023-10-25: qty 1.7

## 2023-10-25 MED ORDER — LORAZEPAM 0.5 MG PO TABS: ORAL_TABLET | ORAL | 0 refills | Status: AC

## 2023-10-25 NOTE — Progress Notes (Signed)
 Patient Care Team: Alvan Dorothyann BIRCH, MD as PCP - General (Family Medicine) Odean Potts, MD as Consulting Physician (Hematology and Oncology)  DIAGNOSIS:  Encounter Diagnosis  Name Primary?   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  Moved to [facility] December 2021 to be closer to her family   02/2020 -  Anti-estrogen oral therapy   Anastrozole  daily   10/21/2020 Treatment Plan Change   Xgeva  every 3 months   10/06/2022 Imaging   IMPRESSION: 1. Slight interval enlargement of bulky right subpectoral lymph nodes, consistent with worsened nodal metastatic disease. 2. Multiple unchanged mixed lytic and sclerotic osseous metastatic lesions, notable for a large, expansile lesion of the right second rib as well as numerous additional expansile rib lesions, thoracolumbar vertebral body lesions, and pelvic lesions. 3. No other evidence of metastatic disease in the chest, abdomen, or pelvis. 4. Coronary artery disease. 5. Aortic valve calcifications. Correlate for echocardiographic evidence of aortic valve dysfunction.   Aortic Atherosclerosis (ICD10-I70.0).     Electronically Signed   By: [name]   On: 10/08/2022 07:16   10/15/2022 Cancer Staging   Staging form: Breast, AJCC 8th Edition - Clinical: Stage IV (pM1) - Signed by Crawford Morna Pickle, NP on 10/15/2022     CHIEF COMPLIANT:    HISTORY OF PRESENT ILLNESS:  History of Present Illness Jodi  Kaufman is an 83 year old female who presents with fatigue and numbness in her legs and feet.  She experiences increased fatigue compared to last month, significantly affecting her ability to get out of bed and perform daily activities. Numbness in her legs, ankles, and feet persists, described as feeling 'numb and sleepy.' Recent blood work shows a hemoglobin level of 14.5, with normal white blood cell and platelet counts. She awaits further blood test results, including glucose levels.     ALLERGIES:  is allergic to atorvastatin, bactrim  [sulfamethoxazole -trimethoprim ], clavulanic acid, guaifenesin & derivatives, and tamoxifen.  MEDICATIONS:  Current Outpatient Medications  Medication Sig Dispense Refill   acetaminophen  (TYLENOL ) 160 MG/5ML liquid Take 500 mg by mouth every 4 (four) hours as needed for fever.     albuterol  (VENTOLIN  HFA) 108 (90 Base) MCG/ACT inhaler Inhale 2 puffs into the lungs every 6 (six) hours as needed for wheezing. (Patient not taking: Reported on 09/22/2023) 8.5 g 11   LORazepam  (ATIVAN ) 0.5 MG tablet Take 1 tablet 1 hour before scan- may repeat at time of scan if needed. 10 tablet 0   Spacer/Aero-Holding Chambers (AEROCHAMBER PLUS WITH MASK) inhaler Use with inhaler (Patient not taking: Reported on 10/25/2023) 1 each 3   No current facility-administered medications for this visit.   Facility-Administered Medications Ordered in Other Visits  Medication Dose Route Frequency Provider Last Rate Last Admin   fulvestrant  (FASLODEX ) injection 500 mg  500 mg Intramuscular Once Sherral Dirocco, MD        PHYSICAL EXAMINATION: ECOG PERFORMANCE STATUS: 1 - Symptomatic but completely ambulatory  Vitals:   10/25/23 1141  BP: 125/63  Pulse: 87  Resp: 18  Temp: (!) 101.3 F (38.5 C)  SpO2: 99%   Filed Weights   10/25/23 1141  Weight: 135 lb 11.2 oz (61.6 kg)    LABORATORY DATA:  I have reviewed  the data as listed    Latest Ref Rng & Units 10/25/2023   11:12 AM 07/22/2023   10:51 AM 04/09/2023    2:02 PM  CMP  Glucose 70 - 99 mg/dL 894  99  96   BUN 8 - 23 mg/dL 30  31  32   Creatinine 0.44 - 1.00 mg/dL 9.17  9.16  9.15   Sodium 135 - 145 mmol/L 139  142  139   Potassium 3.5 - 5.1 mmol/L 4.6  4.5  4.8   Chloride 98 - 111 mmol/L 103  106  103   CO2 22 - 32 mmol/L 29  31  31    Calcium 8.9 - 10.3 mg/dL 9.7  9.5  9.7   Total Protein 6.5 - 8.1 g/dL 7.0  7.1  7.2   Total Bilirubin 0.0 - 1.2 mg/dL 0.8  0.8  0.7   Alkaline Phos 38 - 126 U/L 52  60  56   AST 15 - 41 U/L 24  25  31    ALT 0 - 44 U/L 17  19  23      Lab Results  Component Value Date   WBC 5.5 10/25/2023   HGB 14.5 10/25/2023   HCT 43.1 10/25/2023   MCV 94.5 10/25/2023   PLT 176 10/25/2023   NEUTROABS 4.1 10/25/2023    ASSESSMENT & PLAN:  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 Now presenting with metastatic disease based on scans done 03/11/2020 CT CAP: Numerous lytic lesions in the thoracic spine several with expansion extraosseous extension. 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  Uncertain if any radiation was given to the skull lesions.   Current Treatment:   letrozole . Started December 2021, discontinued for progression and switched to Faslodex  10/15/2022   Poor nutritional status: Since the tube feedings have started her weight is improving.  Energy levels are improving and she is got a better positive outlook.    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  No evidence of visceral metastatic disease. CT CAP 04/15/2021: No substantial interval change similar bone metastases CT CAP: 10/16/21: Enlarging Rt  Subpectoral/ Axill LN, stable bone mets. PET CT scan 01/10/2022: Hypermetabolic right subpectoral/axillary lymph node, stable bone metastases CT CAP 10/08/2022: Slight interval enlargement of the right subpectoral lymph node 6 cm (was 4.8 cm) stable bone metastases. CT CAP 12/30/2022: No significant interval change in the bone metastases.  Stable bulky subpectoral lymph nodes. CT CAP 04/29/2023: No significant interval change in the bone metastasis, no change in the bulky right subpectoral lymph nodes, new hypodense foci in the cecum (?  Ingested material or tablets)   Faslodex  toxicities: Fatigue   Guardant360 05/11/2023: PIK3CA mutation, RAF 1 mutation, TMB 21.35, MSI not detected I had previously discussed with her about different options for treatment including Verzinio, Capivasertib or Keytruda immunotherapy. For the time being we will continue on Faslodex  injections.    CT CAP 08/18/2023: Slight progression of subpectoral lymph node from 6.1  cm to 6.4 cm, stable bone metastases If the patient does not want to continue Faslodex  and we will consider switching her to letrozole  with Verzenio. She will inform us  in the next couple of weeks about her decision ------------------------------------- Assessment and Plan Assessment & Plan Primary malignant neoplasm of breast with metastasis Breast cancer with metastasis, on Faslodex  with significant side effects. Slight progression in subpectoral lymph node on last CT. Discussed potential switch to Verzenio and Exemestane due to injection intolerance. - Administer Faslodex  injection today. - Discuss potential switch to oral regimen with Verzenio and Exemestane next month. - Monitor for side effects such as diarrhea, fatigue, low blood counts, and potential liver enzyme elevation. - Plan for CT scan in July to assess disease progression. - Consider switching back to injections if oral regimen is not tolerated.   Fatigue Persistent fatigue, possibly  exacerbated by cancer treatment. Proposed oral regimen may also contribute. - Monitor fatigue levels with current and potential new treatment regimens. - Discuss potential side effects of Verzenio, including fatigue.   PEG tube complications Complications with PEG tube, replaced with larger size, slight leakage remains a concern. - Monitor PEG tube for leakage and ensure it remains secure. - Ensure proper nutrition and medication administration through the PEG tube. ------------------------------------- Assessment and Plan Assessment & Plan Fatigue Increased fatigue with leg numbness. Hemoglobin 14.5, ruling out anemia. Normal WBC and platelets. - Reassess symptoms after CT scan on November 16, 2023. - Review complete blood work results once available.  Anxiety related to CT scan and personal stressors Anxiety due to CT scan and personal stressors. Requested Ativan . - Prescribe 10 tablets of Ativan . - Ensure CT scan is completed on November 16, 2023. - Provide printout of blood work results upon request.  PEG tube complications No current PEG tube issues. - Monitor for further PEG tube complications.      No orders of the defined types were placed in this encounter.  The patient has a good understanding of the overall plan. she agrees with it. she will call with any problems that may develop before the next visit here. Total time spent: 30 mins including face to face time and time spent for planning, charting and co-ordination of care   Viinay K Janal Haak, MD 10/25/23

## 2023-11-12 ENCOUNTER — Inpatient Hospital Stay: Payer: Medicare Other

## 2023-11-16 ENCOUNTER — Ambulatory Visit (HOSPITAL_COMMUNITY)
Admission: RE | Admit: 2023-11-16 | Discharge: 2023-11-16 | Disposition: A | Discharge status: Home / Self Care | Source: Ambulatory Visit | Attending: Hematology and Oncology | Admitting: Hematology and Oncology

## 2023-11-16 DIAGNOSIS — C7951 Secondary malignant neoplasm of bone: Secondary | ICD-10-CM | POA: Insufficient documentation

## 2023-11-16 MED ORDER — IOHEXOL 300 MG/ML  SOLN
100.0000 mL | Freq: Once | INTRAMUSCULAR | Status: AC | PRN
  Administered 2023-11-16: 100 mL via INTRAVENOUS

## 2023-11-22 ENCOUNTER — Telehealth: Payer: Self-pay

## 2023-11-22 ENCOUNTER — Other Ambulatory Visit (HOSPITAL_COMMUNITY): Payer: Self-pay

## 2023-11-22 ENCOUNTER — Inpatient Hospital Stay

## 2023-11-22 ENCOUNTER — Inpatient Hospital Stay: Attending: Hematology and Oncology | Admitting: Hematology and Oncology

## 2023-11-22 VITALS — BP 103/61 | HR 88 | Temp 98.2°F | Resp 16 | Ht 66.0 in | Wt 135.8 lb

## 2023-11-22 DIAGNOSIS — Z1732 Human epidermal growth factor receptor 2 negative status: Secondary | ICD-10-CM | POA: Diagnosis not present

## 2023-11-22 DIAGNOSIS — Z79811 Long term (current) use of aromatase inhibitors: Secondary | ICD-10-CM | POA: Insufficient documentation

## 2023-11-22 DIAGNOSIS — C50911 Malignant neoplasm of unspecified site of right female breast: Secondary | ICD-10-CM | POA: Diagnosis present

## 2023-11-22 DIAGNOSIS — C7951 Secondary malignant neoplasm of bone: Secondary | ICD-10-CM | POA: Diagnosis present

## 2023-11-22 DIAGNOSIS — Z923 Personal history of irradiation: Secondary | ICD-10-CM | POA: Insufficient documentation

## 2023-11-22 DIAGNOSIS — C7931 Secondary malignant neoplasm of brain: Secondary | ICD-10-CM | POA: Diagnosis not present

## 2023-11-22 DIAGNOSIS — C50919 Malignant neoplasm of unspecified site of unspecified female breast: Secondary | ICD-10-CM

## 2023-11-22 DIAGNOSIS — Z170 Estrogen receptor positive status [ER+]: Secondary | ICD-10-CM | POA: Diagnosis not present

## 2023-11-22 DIAGNOSIS — Z1722 Progesterone receptor negative status: Secondary | ICD-10-CM | POA: Diagnosis not present

## 2023-11-22 MED ORDER — LETROZOLE 2.5 MG PO TABS
2.5000 mg | ORAL_TABLET | Freq: Every day | ORAL | 3 refills | Status: DC
  Filled 2023-12-01 – 2023-12-03 (×2): qty 30, 30d supply, fill #0
  Filled 2023-12-03: qty 90, 90d supply, fill #0
  Filled 2024-01-03 – 2024-01-04 (×2): qty 30, 30d supply, fill #1
  Filled 2024-01-31 – 2024-02-01 (×2): qty 30, 30d supply, fill #2
  Filled 2024-02-28 (×2): qty 30, 30d supply, fill #3
  Filled 2024-03-27: qty 30, 30d supply, fill #4

## 2023-11-22 MED ORDER — ABEMACICLIB 100 MG PO TABS: 100.0000 mg | ORAL_TABLET | Freq: Two times a day (BID) | ORAL | 3 refills | Status: DC

## 2023-11-22 NOTE — Assessment & Plan Note (Signed)
 Originally diagnosed in 1995, patient did not take prescribed tamoxifen. Now presenting with metastatic disease based on scans done 03/11/2020 CT CAP: Numerous lytic lesions in the thoracic spine several with expansion extraosseous extension. 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  Uncertain if any radiation was given to the skull lesions.   Current Treatment:   letrozole . Started December 2021, discontinued for progression and switched to Faslodex  10/15/2022   Poor nutritional status: Since the tube feedings have started her weight is improving.  Energy levels are improving and she is got a better positive outlook.    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  No evidence of visceral metastatic disease. CT CAP 04/15/2021: No substantial interval change similar bone metastases CT CAP: 10/16/21: Enlarging Rt Subpectoral/ Axill LN, stable bone mets. PET CT scan 01/10/2022: Hypermetabolic right subpectoral/axillary lymph node, stable bone metastases CT CAP 10/08/2022: Slight interval enlargement of the right subpectoral lymph node 6 cm (was 4.8 cm) stable bone metastases. CT CAP 12/30/2022: No significant interval change in the bone metastases.  Stable bulky subpectoral lymph nodes. CT CAP 04/29/2023: No significant interval change in the bone metastasis, no change in the bulky right subpectoral lymph nodes, new hypodense foci in the cecum (?  Ingested material or tablets)   Faslodex  toxicities: Fatigue   Guardant360 05/11/2023: PIK3CA mutation, RAF 1 mutation, TMB 21.35, MSI not detected I had previously discussed with her about different options for treatment including Verzinio, Capivasertib or Keytruda immunotherapy. For the time being we will continue on Faslodex  injections.    CT CAP  08/18/2023: Slight progression of subpectoral lymph node from 6.1 cm to 6.4 cm, stable bone metastases If the patient does not want to continue Faslodex  and we will consider switching her to letrozole  with Verzenio . She will inform us  in the next couple of weeks about her decision  Leakage around PEG tube Fatigue from treatment

## 2023-11-22 NOTE — Progress Notes (Signed)
 Patient Care Team: Alvan Dorothyann BIRCH, MD as PCP - General (Family Medicine) Odean Potts, MD as Consulting Physician (Hematology and Oncology)  DIAGNOSIS:  Encounter Diagnosis  Name Primary?   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  Moved to [facility] December 2021 to be closer to her family   02/2020 -  Anti-estrogen oral therapy   Anastrozole  daily   10/21/2020 Treatment Plan Change   Xgeva  every 3 months   10/06/2022 Imaging   IMPRESSION: 1. Slight interval enlargement of bulky right subpectoral lymph nodes, consistent with worsened nodal metastatic disease. 2. Multiple unchanged mixed lytic and sclerotic osseous metastatic lesions, notable for a large, expansile lesion of the right second rib as well as numerous additional expansile rib lesions, thoracolumbar vertebral body lesions, and pelvic lesions. 3. No other evidence of metastatic disease in the chest, abdomen, or pelvis. 4. Coronary artery disease. 5. Aortic valve calcifications. Correlate for echocardiographic evidence of aortic valve dysfunction.   Aortic Atherosclerosis (ICD10-I70.0).     Electronically Signed   By: [name]   On: 10/08/2022 07:16   10/15/2022 Cancer Staging   Staging form: Breast, AJCC 8th Edition - Clinical: Stage IV (pM1) - Signed by Crawford Morna Pickle, NP on 10/15/2022     CHIEF COMPLIANT:  Follow-up to discuss her treatment plan based on CT scans  HISTORY OF PRESENT ILLNESS:   History of Present Illness Jodi Kaufman is an 83 year old female with a subpectoral tumor who presents for follow-up on tumor progression and treatment evaluation.  The subpectoral tumor on the right side has been increasing in size over the past year, now measuring 5.3 cm by 2.5 cm. It is located behind the pectoral muscle, near the shoulder and lymph nodes. Current treatment with injections is not effectively controlling the tumor's growth.  She experiences very dry skin, which she rubs until it bleeds. There is weakness, stiffness, and numbness in her arm, initially thought to be due to arthritis.  Her medication regimen includes Faslodex . She previously took letrozole , which was ineffective, and is no longer taking it.     ALLERGIES:  is allergic to atorvastatin, bactrim  [sulfamethoxazole -trimethoprim ], clavulanic acid, guaifenesin & derivatives, and tamoxifen.  MEDICATIONS:  Current Outpatient Medications  Medication Sig Dispense Refill   abemaciclib  (VERZENIO ) 100 MG tablet Take 1 tablet (100 mg total) by mouth 2 (two) times daily. 60 tablet 3   acetaminophen  (TYLENOL ) 160 MG/5ML liquid Take 500 mg by mouth every 4 (four) hours as needed for fever.     albuterol  (VENTOLIN  HFA) 108 (90 Base) MCG/ACT inhaler Inhale 2 puffs into the lungs every 6 (six) hours as needed for wheezing. 8.5 g 11   letrozole  (FEMARA ) 2.5 MG tablet Take 1 tablet (2.5 mg total) by mouth daily. 90 tablet 3   Spacer/Aero-Holding Chambers (AEROCHAMBER PLUS WITH MASK) inhaler Use with inhaler 1 each 3   LORazepam  (ATIVAN ) 0.5 MG tablet Take 1 tablet 1  hour before scan- may repeat at time of scan if needed. (Patient not taking: Reported on 11/22/2023) 10 tablet 0   No current facility-administered medications for this visit.   Facility-Administered Medications Ordered in Other Visits  Medication Dose Route Frequency Provider  Last Rate Last Admin   fulvestrant  (FASLODEX ) injection 500 mg  500 mg Intramuscular Once Odean Potts, MD        PHYSICAL EXAMINATION: ECOG PERFORMANCE STATUS: 1 - Symptomatic but completely ambulatory  Vitals:   11/22/23 1139  BP: 103/61  Pulse: 88  Resp: 16  Temp: 98.2 F (36.8 C)  SpO2: 99%   Filed Weights   11/22/23 1139  Weight: 135 lb 12.8 oz (61.6 kg)      LABORATORY DATA:  I have reviewed the data as listed    Latest Ref Rng & Units 10/25/2023   11:12 AM 07/22/2023   10:51 AM 04/09/2023    2:02 PM  CMP  Glucose 70 - 99 mg/dL 894  99  96   BUN 8 - 23 mg/dL 30  31  32   Creatinine 0.44 - 1.00 mg/dL 9.17  9.16  9.15   Sodium 135 - 145 mmol/L 139  142  139   Potassium 3.5 - 5.1 mmol/L 4.6  4.5  4.8   Chloride 98 - 111 mmol/L 103  106  103   CO2 22 - 32 mmol/L 29  31  31    Calcium 8.9 - 10.3 mg/dL 9.7  9.5  9.7   Total Protein 6.5 - 8.1 g/dL 7.0  7.1  7.2   Total Bilirubin 0.0 - 1.2 mg/dL 0.8  0.8  0.7   Alkaline Phos 38 - 126 U/L 52  60  56   AST 15 - 41 U/L 24  25  31    ALT 0 - 44 U/L 17  19  23      Lab Results  Component Value Date   WBC 5.5 10/25/2023   HGB 14.5 10/25/2023   HCT 43.1 10/25/2023   MCV 94.5 10/25/2023   PLT 176 10/25/2023   NEUTROABS 4.1 10/25/2023    ASSESSMENT & PLAN:  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 Now presenting with metastatic disease based on scans done 03/11/2020 CT CAP: Numerous lytic lesions in the thoracic spine several with expansion extraosseous extension. 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  Uncertain if any radiation was given to the skull lesions.   Current Treatment:   letrozole . Started December 2021, discontinued for progression and switched  to Faslodex  10/15/2022   Poor nutritional status: Since the tube feedings have started her weight is improving.  Energy levels are improving and she is got a better positive outlook.    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  No evidence of visceral metastatic disease. CT CAP 04/15/2021: No substantial interval change similar bone metastases CT CAP: 10/16/21: Enlarging Rt Subpectoral/ Axill LN, stable bone mets. PET CT scan 01/10/2022: Hypermetabolic right subpectoral/axillary lymph node, stable bone metastases CT CAP 10/08/2022: Slight interval enlargement of the right subpectoral lymph node 6 cm (was 4.8 cm) stable bone metastases. CT CAP 12/30/2022: No significant interval change in the bone metastases.  Stable bulky subpectoral lymph nodes. CT CAP 04/29/2023: No significant interval change in the bone  metastasis, no change in the bulky right subpectoral lymph nodes, new hypodense foci in the cecum (?  Ingested material or tablets)   Faslodex  toxicities: Fatigue   Guardant360 05/11/2023: PIK3CA mutation, RAF 1 mutation, TMB 21.35, MSI not detected I had previously discussed with her about different options for treatment including Verzinio, Capivasertib or Keytruda immunotherapy. For the time being we will continue on Faslodex  injections.    CT CAP 08/18/2023: Slight progression of subpectoral lymph node from 6.1 cm to 6.4 cm, stable bone metastases Based on progression of disease, I recommended switching her from Faslodex  to Verzenio  with letrozole .  We discussed risks and benefits of Verzinio and she is willing to proceed.  We will start her at 100 mg p.o. twice daily and adjust the dosage accordingly.  Abemaciclib  counseling: I discussed at length the risks and benefits of Abemaciclib  in combination with letrozole . Adverse effects of Abemaciclib  include decreasing neutrophil count, pneumonia, blood clots in lungs as well as nausea and GI symptoms. Side  effects of letrozole  include hot flashes, muscle aches and pains, uterine bleeding/spotting/cancer, osteoporosis, risk of blood clots.   Leakage around PEG tube: IR was able to fix it. Fatigue from treatment Appointment with John will be placed for 1 week for education.  She knows that we will alternate visits with Norleen every 2 weeks after that for follow-ups.      Orders Placed This Encounter  Procedures   CBC with Differential (Cancer Center Only)    Standing Status:   Future    Expiration Date:   11/21/2024   CMP (Cancer Center only)    Standing Status:   Future    Expiration Date:   11/21/2024   CA 27.29    Standing Status:   Future    Expiration Date:   11/21/2024   The patient has a good understanding of the overall plan. she agrees with it. she will call with any problems that may develop before the next visit here. Total time spent: 45 mins including face to face time and time spent for planning, charting and co-ordination of care   Naomi MARLA Chad, MD 11/22/23

## 2023-11-22 NOTE — Telephone Encounter (Signed)
Yes, I will keep you updated.

## 2023-11-22 NOTE — Telephone Encounter (Signed)
 Oral Oncology Patient Advocate Encounter   Received notification that prior authorization for Verzenio  is required.   PA submitted on 11/22/23 Key [id number] Status is pending      [name],  CPhT-Adv  she/her/hers [facility]  [facility] Pharmacy Technician Patient Advocate Specialist III [facility] Phone: [phone]  Fax: [phone] [email or web address]

## 2023-11-22 NOTE — Telephone Encounter (Signed)
 Oral Oncology Patient Advocate Encounter  Prior Authorization for Verzenio  has been approved.    PA#  [id number] Effective dates: 11/22/23 through 04/26/24  Patients co-pay is $1943.82.     [name],  CPhT-Adv  she/her/hers [facility]  [facility] Pharmacy Technician Patient Advocate Specialist III [facility] Phone: [phone]  Fax: [phone] [email or web address]

## 2023-11-23 ENCOUNTER — Telehealth: Payer: Self-pay | Admitting: Hematology and Oncology

## 2023-11-23 NOTE — Telephone Encounter (Signed)
 Called patient to inform lab was added to 8/4 appt. Mailbox full unable to leave vm

## 2023-11-25 NOTE — Progress Notes (Signed)
 [facility]       Telephone: [phone]?Fax: [phone]   Oncology Clinical Pharmacist Practitioner Initial Assessment  Jodi Kaufman is a 83 y.o. female with a diagnosis of breast cancer. They were contacted today via in-person visit.  Indication/Regimen Abemaciclib  (Verzenio ) is being used appropriately for treatment of metastatic breast cancer by Dr. [name].      Wt Readings from Last 1 Encounters:  11/29/23 135 lb 11.2 oz (61.6 kg)    Estimated body surface area is 1.69 meters squared as calculated from the following:   Height as of 11/22/23: 5' 6 (1.676 m).   Weight as of this encounter: 135 lb 11.2 oz (61.6 kg).  The dosing regimen is 100 mg by mouth every 12 hours on days 1 to 28 of a 28-day cycle. This is being given  in combination with letrozole  started 04/01/20 and denosumab  120 mg (Xgeva ) started 10/21/20. It is planned to continue until disease progression or unacceptable toxicity. Prescription dose and frequency assessed for appropriateness.  Patient has agreed to treatment which is documented in physician note on 11/22/23. Counseled patient on administration, dosing, side effects, monitoring, drug-food interactions, safe handling, storage, and disposal.  Patient in process of filling out financial assistance forms. Will start abemaciclib  once received. Signed forms today and will continue process with [employer].   Dose Modifications Dr. Gudena is starting at reduced dose of 100 mg PO BID  Access Assessment Jodi Kaufman will be receiving abemaciclib  through [facility] Insurance Concerns: financial assistance pending Start date if known: TBD  Adherence Assessment Reviewed importance on keeping a med schedule and plan for any missed doses Barriers to adherence identified? No  Allergies Allergies  Allergen Reactions   Atorvastatin     Other reaction(s): Other (comments) Side effects   Bactrim   [Sulfamethoxazole -Trimethoprim ] Other (See Comments)    Dizziness,blurred vision    Clavulanic Acid Hives   Guaifenesin & Derivatives Other (See Comments)    Stomach upset, severe  Patient states she is not allergic.    Tamoxifen Nausea Only    Vitals    11/29/2023   11:09 AM 11/22/2023   11:39 AM 10/25/2023   11:41 AM  Oncology Vitals  Height  168 cm   Weight 61.553 kg 61.598 kg 61.553 kg  Weight (lbs) 135 lbs 11 oz 135 lbs 13 oz 135 lbs 11 oz  BMI 21.9 kg/m2 21.92 kg/m2 21.9 kg/m2  Temp 99.7 F (37.6 C) 98.2 F (36.8 C) 101.3 F (38.5 C)  Pulse Rate 85 88 87  BP 107/57 103/61 125/63  Resp 18 16 18   SpO2 99 % 99 % 99 %  BSA (m2) 1.69 m2 1.69 m2 1.69 m2     Laboratory Data    Latest Ref Rng & Units 11/29/2023   10:46 AM 10/25/2023   11:12 AM 08/25/2023    9:10 AM  CBC EXTENDED  WBC 4.0 - 10.5 K/uL 6.1  5.5  5.6   RBC 3.87 - 5.11 MIL/uL 4.58  4.56  3.64   Hemoglobin 12.0 - 15.0 g/dL 85.3  85.4  88.4   HCT 36.0 - 46.0 % 43.3  43.1  36.2   Platelets 150 - 400 K/uL 186  176  130   NEUT# 1.7 - 7.7 K/uL 4.8  4.1    Lymph# 0.7 - 4.0 K/uL 0.5  0.6         Latest Ref Rng & Units 11/29/2023   10:46 AM 10/25/2023   11:12  AM 07/22/2023   10:51 AM  CMP  Glucose 70 - 99 mg/dL 894  894  99   BUN 8 - 23 mg/dL 30  30  31    Creatinine 0.44 - 1.00 mg/dL 9.15  9.17  9.16   Sodium 135 - 145 mmol/L 137  139  142   Potassium 3.5 - 5.1 mmol/L 4.6  4.6  4.5   Chloride 98 - 111 mmol/L 103  103  106   CO2 22 - 32 mmol/L 30  29  31    Calcium 8.9 - 10.3 mg/dL 9.4  9.7  9.5   Total Protein 6.5 - 8.1 g/dL 6.9  7.0  7.1   Total Bilirubin 0.0 - 1.2 mg/dL 0.8  0.8  0.8   Alkaline Phos 38 - 126 U/L 47  52  60   AST 15 - 41 U/L 26  24  25    ALT 0 - 44 U/L 18  17  19     Contraindications Contraindications were reviewed? Yes Contraindications to therapy were identified? No   Safety Precautions The following safety precautions for the use of abemaciclib  were reviewed:  Changes in kidney  function: importance of drinking plenty of fluids and monitoring urine output Diarrhea: we reviewed that diarrhea is common with abemaciclib  and confirmed that she does have loperamide (Imodium) at home.  We reviewed how to take this medication PRN and gave her information on abemaciclib  Decreased white blood cells (WBCs) and increased risk for infection: we discussed the importance of having a thermometer and what the Centers for Disease Control and Prevention (CDC) considers a fever which is 100.50F (38C) or higher.  Gave patient 24/7 triage line to call if any fevers or symptoms Decreased hemoglobin, part of red blood cells that carry iron and oxygen Fatigue Nausea and Vomiting Hepatotoxicity: reviewed to contact clinic for RUQ pain that will not subside, yellowing of eyes/skin Decreased appetite or weight loss Abdominal pain Decreased platelet count and increased risk for bleeding Venous thromboembolism (VTE): reviewed signs of deep vein thrombosis (DVT) such as leg swelling, redness, pain, or tenderness and signs of pulmonary embolism (PE) such as shortness of breath, rapid or irregular heartbeat, cough, chest pain, or lightheadedness ILD/Pneumonitis: we reviewed potential symptoms including cough, shortness, and fatigue. Handling body fluids and waste Pregnancy, sexual activity, and contraception Avoid grapefruit products Reviewed to take the medication every 12 hours (with food sometimes can be easier on the stomach) and to take it at the same time every day. Discussed proper storage and handling of abemaciclib   Medication Reconciliation Current Outpatient Medications  Medication Sig Dispense Refill   abemaciclib  (VERZENIO ) 100 MG tablet Take 1 tablet (100 mg total) by mouth 2 (two) times daily. 60 tablet 3   acetaminophen  (TYLENOL ) 160 MG/5ML liquid Take 500 mg by mouth every 4 (four) hours as needed for fever.     albuterol  (VENTOLIN  HFA) 108 (90 Base) MCG/ACT inhaler Inhale 2 puffs  into the lungs every 6 (six) hours as needed for wheezing. 8.5 g 11   letrozole  (FEMARA ) 2.5 MG tablet Take 1 tablet (2.5 mg total) by mouth daily. 90 tablet 3   LORazepam  (ATIVAN ) 0.5 MG tablet Take 1 tablet 1 hour before scan- may repeat at time of scan if needed. (Patient not taking: Reported on 11/22/2023) 10 tablet 0   Spacer/Aero-Holding Chambers (AEROCHAMBER PLUS WITH MASK) inhaler Use with inhaler 1 each 3   No current facility-administered medications for this visit.   Facility-Administered Medications Ordered in Other  Visits  Medication Dose Route Frequency Provider Last Rate Last Admin   fulvestrant  (FASLODEX ) injection 500 mg  500 mg Intramuscular Once Gudena, Vinay, MD        Medication reconciliation is based on the patient's most recent medication list in the electronic medical record (EMR) including herbal products and OTC medications.   The patient's medication list was reviewed today with the patient? Yes   Drug-drug interactions (DDIs) DDIs were evaluated? Yes Significant DDIs identified? No   Drug-Food Interactions Drug-food interactions were evaluated? Yes Drug-food interactions identified? Grapefruit products  Follow-up Plan  Patient education handout given to patient Start abemaciclib  100 mg by mouth every 12 hours. Start date TBD. Has Peg-Tube and discussed administration since she is not able to swallow tablets/capsules Continue letrozole  2.5 mg by mouth daily Continue denosumab  120 mg (Xgeva ) SubQ every 12 weeks. Last given 10/25/23, next due 01/17/24 Monitor for toxicities. If experiencing loose stool, can use liquid loperamide (Imodium) Will add labs, Dr. Odean visit in 3 weeks. Will likely take 1 week to start abemaciclib . Will add labs, pharmacy clinic visit in 5 weeks Jodi Kaufman can follow up with clinical pharmacy as deemed necessary by Dr. Mackey Odean going forward   Jodi Kaufman participated in the discussion, expressed understanding, and  voiced agreement with the above plan. All questions were answered to her satisfaction. The patient was advised to contact the clinic at [phone] with any questions or concerns prior to her return visit.   I spent 60 minutes assessing the patient.  Rozella Servello A. Kaufman, PharmD, BCOP, CPP  Jodi Kaufman, RPH-CPP, 11/29/2023 12:10 PM  **Disclaimer: This note was dictated with voice recognition software. Similar sounding words can inadvertently be transcribed and this note may contain transcription errors which may not have been corrected upon publication of note.**

## 2023-11-29 ENCOUNTER — Other Ambulatory Visit

## 2023-11-29 ENCOUNTER — Inpatient Hospital Stay: Attending: Hematology and Oncology | Admitting: Pharmacist

## 2023-11-29 ENCOUNTER — Inpatient Hospital Stay

## 2023-11-29 VITALS — BP 107/57 | HR 85 | Temp 99.7°F | Resp 18 | Wt 135.7 lb

## 2023-11-29 DIAGNOSIS — Z1722 Progesterone receptor negative status: Secondary | ICD-10-CM | POA: Diagnosis not present

## 2023-11-29 DIAGNOSIS — C50911 Malignant neoplasm of unspecified site of right female breast: Secondary | ICD-10-CM | POA: Diagnosis present

## 2023-11-29 DIAGNOSIS — C7931 Secondary malignant neoplasm of brain: Secondary | ICD-10-CM | POA: Diagnosis not present

## 2023-11-29 DIAGNOSIS — Z1732 Human epidermal growth factor receptor 2 negative status: Secondary | ICD-10-CM | POA: Diagnosis not present

## 2023-11-29 DIAGNOSIS — C50919 Malignant neoplasm of unspecified site of unspecified female breast: Secondary | ICD-10-CM

## 2023-11-29 DIAGNOSIS — Z170 Estrogen receptor positive status [ER+]: Secondary | ICD-10-CM | POA: Diagnosis not present

## 2023-11-29 DIAGNOSIS — Z79811 Long term (current) use of aromatase inhibitors: Secondary | ICD-10-CM | POA: Insufficient documentation

## 2023-11-29 DIAGNOSIS — C7951 Secondary malignant neoplasm of bone: Secondary | ICD-10-CM | POA: Diagnosis present

## 2023-11-29 LAB — CBC WITH DIFFERENTIAL (CANCER CENTER ONLY)
Abs Immature Granulocytes: 0.01 K/uL (ref 0.00–0.07)
Basophils Absolute: 0 K/uL (ref 0.0–0.1)
Basophils Relative: 1 %
Eosinophils Absolute: 0.2 K/uL (ref 0.0–0.5)
Eosinophils Relative: 3 %
HCT: 43.3 % (ref 36.0–46.0)
Hemoglobin: 14.6 g/dL (ref 12.0–15.0)
Immature Granulocytes: 0 %
Lymphocytes Relative: 8 %
Lymphs Abs: 0.5 K/uL — ABNORMAL LOW (ref 0.7–4.0)
MCH: 31.9 pg (ref 26.0–34.0)
MCHC: 33.7 g/dL (ref 30.0–36.0)
MCV: 94.5 fL (ref 80.0–100.0)
Monocytes Absolute: 0.6 K/uL (ref 0.1–1.0)
Monocytes Relative: 10 %
Neutro Abs: 4.8 K/uL (ref 1.7–7.7)
Neutrophils Relative %: 78 %
Platelet Count: 186 K/uL (ref 150–400)
RBC: 4.58 MIL/uL (ref 3.87–5.11)
RDW: 14 % (ref 11.5–15.5)
WBC Count: 6.1 K/uL (ref 4.0–10.5)
nRBC: 0 % (ref 0.0–0.2)

## 2023-11-29 LAB — CMP (CANCER CENTER ONLY)
ALT: 18 U/L (ref 0–44)
AST: 26 U/L (ref 15–41)
Albumin: 4.1 g/dL (ref 3.5–5.0)
Alkaline Phosphatase: 47 U/L (ref 38–126)
Anion gap: 4 — ABNORMAL LOW (ref 5–15)
BUN: 30 mg/dL — ABNORMAL HIGH (ref 8–23)
CO2: 30 mmol/L (ref 22–32)
Calcium: 9.4 mg/dL (ref 8.9–10.3)
Chloride: 103 mmol/L (ref 98–111)
Creatinine: 0.84 mg/dL (ref 0.44–1.00)
GFR, Estimated: >60 mL/min (ref >60)
Glucose, Bld: 105 mg/dL — ABNORMAL HIGH (ref 70–99)
Potassium: 4.6 mmol/L (ref 3.5–5.1)
Sodium: 137 mmol/L (ref 135–145)
Total Bilirubin: 0.8 mg/dL (ref 0.0–1.2)
Total Protein: 6.9 g/dL (ref 6.5–8.1)

## 2023-11-29 NOTE — Telephone Encounter (Signed)
 Oral Oncology Patient Advocate Encounter   Submitted application for assistance for Verzenio  to the [facility]. Patient has been APPROVED  11/29/23-04/26/2024  [employer] phone number [phone]   Will continue to follow until shipment date verified.    [name],  CPhT-Adv  she/her/hers [facility]  [facility] Pharmacy Technician Patient Advocate Specialist III [facility] Phone: [phone]  Fax: [phone] [email or web address]

## 2023-11-30 LAB — CANCER ANTIGEN 27.29: CA 27.29: 564.9 U/mL — ABNORMAL HIGH (ref 0.0–38.6)

## 2023-12-01 ENCOUNTER — Other Ambulatory Visit (HOSPITAL_COMMUNITY): Payer: Self-pay

## 2023-12-01 ENCOUNTER — Telehealth: Payer: Self-pay

## 2023-12-01 ENCOUNTER — Encounter: Payer: Self-pay | Admitting: Hematology and Oncology

## 2023-12-01 ENCOUNTER — Other Ambulatory Visit (HOSPITAL_BASED_OUTPATIENT_CLINIC_OR_DEPARTMENT_OTHER): Payer: Self-pay

## 2023-12-01 NOTE — Telephone Encounter (Signed)
 Oral Oncology Patient Advocate Encounter   Assisted patient in locating a pharmacy that  has or can order  a certain brand of Letrozole  made in the [place] or [place]. [facility]  has [facility] brand and has arranged for her to receive. Cash price is #30 Tabs for $14.75  Expected Delivery 12-03-23     [name],  CPhT-Adv  she/her/hers [facility]  [facility] Pharmacy Technician Patient Advocate Specialist III [facility] Phone: [phone]  Fax: [phone] [email or web address]

## 2023-12-02 ENCOUNTER — Other Ambulatory Visit (HOSPITAL_COMMUNITY): Payer: Self-pay

## 2023-12-03 ENCOUNTER — Encounter: Payer: Self-pay | Admitting: Hematology and Oncology

## 2023-12-03 ENCOUNTER — Other Ambulatory Visit: Payer: Self-pay

## 2023-12-03 ENCOUNTER — Other Ambulatory Visit (HOSPITAL_COMMUNITY): Payer: Self-pay

## 2023-12-06 ENCOUNTER — Other Ambulatory Visit (HOSPITAL_COMMUNITY): Payer: Self-pay

## 2023-12-06 ENCOUNTER — Other Ambulatory Visit: Payer: Self-pay

## 2023-12-13 ENCOUNTER — Other Ambulatory Visit: Payer: Self-pay | Admitting: Pharmacist

## 2023-12-13 NOTE — Progress Notes (Signed)
 [facility]       Telephone: [phone]?Fax: [phone]   Oncology Clinical Pharmacist Practitioner Progress Note  Jodi  Kaufman is a 83 y.o. female with a diagnosis of metastatic breast cancer currently on letrozole  and will be starting abemaciclib  tomorrow under the care of Dr. [name].   I connected with Jodi  Kaufman today by telephone and verified that I was speaking with the correct person using two patient identifiers. I discussed the limitations, risks, security and privacy concerns of performing an evaluation and management service by telemedicine and the availability of in-person appointments. The patient/caregiver expressed understanding and agreed to proceed.  Other persons participating in the visit and their role in the encounter: none   Patient's location: home  Provider's location: clinic  Received call from Jodi Kaufman. She was inquiring about administration of abemaciclib  through G-tube. We again reviewed how to administer and she verbalized understanding. She will start abemaciclib  tomorrow and see Dr. Odean with labs on 12/21/23. We did discuss to call [phone] for any questions or concerns in the interim as this number is 24/7/365. All this was reviewed initially at her 11/29/23 education visit.  Jodi  Kaufman participated in the discussion, expressed understanding, and voiced agreement with the above plan. All questions were answered to her satisfaction. The patient was advised to contact the clinic at [phone] with any questions or concerns prior to her return visit.  Clinical pharmacy will continue to support Jodi  Kaufman and Dr. [name] as needed.  Jodi Kaufman, PharmD, BCOP, CPP  Jodi Kaufman, RPH-CPP,  12/13/2023  4:34 PM   **Disclaimer: This note was dictated with voice recognition software. Similar sounding words can inadvertently be transcribed and this note may contain transcription errors which may not have been  corrected upon publication of note.**

## 2023-12-21 ENCOUNTER — Inpatient Hospital Stay

## 2023-12-21 ENCOUNTER — Inpatient Hospital Stay (HOSPITAL_BASED_OUTPATIENT_CLINIC_OR_DEPARTMENT_OTHER): Admitting: Hematology and Oncology

## 2023-12-21 VITALS — BP 108/58 | HR 90 | Temp 98.0°F | Resp 17 | Wt 134.8 lb

## 2023-12-21 DIAGNOSIS — C50919 Malignant neoplasm of unspecified site of unspecified female breast: Secondary | ICD-10-CM

## 2023-12-21 DIAGNOSIS — C7951 Secondary malignant neoplasm of bone: Secondary | ICD-10-CM | POA: Diagnosis not present

## 2023-12-21 LAB — CMP (CANCER CENTER ONLY)
ALT: 23 U/L (ref 0–44)
AST: 27 U/L (ref 15–41)
Albumin: 4.1 g/dL (ref 3.5–5.0)
Alkaline Phosphatase: 52 U/L (ref 38–126)
Anion gap: 5 (ref 5–15)
BUN: 32 mg/dL — ABNORMAL HIGH (ref 8–23)
CO2: 30 mmol/L (ref 22–32)
Calcium: 9.5 mg/dL (ref 8.9–10.3)
Chloride: 103 mmol/L (ref 98–111)
Creatinine: 1.17 mg/dL — ABNORMAL HIGH (ref 0.44–1.00)
GFR, Estimated: 47 mL/min — ABNORMAL LOW (ref >60)
Glucose, Bld: 102 mg/dL — ABNORMAL HIGH (ref 70–99)
Potassium: 4.7 mmol/L (ref 3.5–5.1)
Sodium: 138 mmol/L (ref 135–145)
Total Bilirubin: 0.8 mg/dL (ref 0.0–1.2)
Total Protein: 6.7 g/dL (ref 6.5–8.1)

## 2023-12-21 LAB — CBC WITH DIFFERENTIAL (CANCER CENTER ONLY)
Abs Immature Granulocytes: 0.01 K/uL (ref 0.00–0.07)
Basophils Absolute: 0 K/uL (ref 0.0–0.1)
Basophils Relative: 1 %
Eosinophils Absolute: 0.2 K/uL (ref 0.0–0.5)
Eosinophils Relative: 3 %
HCT: 43.2 % (ref 36.0–46.0)
Hemoglobin: 14.6 g/dL (ref 12.0–15.0)
Immature Granulocytes: 0 %
Lymphocytes Relative: 8 %
Lymphs Abs: 0.5 K/uL — ABNORMAL LOW (ref 0.7–4.0)
MCH: 32.3 pg (ref 26.0–34.0)
MCHC: 33.8 g/dL (ref 30.0–36.0)
MCV: 95.6 fL (ref 80.0–100.0)
Monocytes Absolute: 0.5 K/uL (ref 0.1–1.0)
Monocytes Relative: 6 %
Neutro Abs: 5.8 K/uL (ref 1.7–7.7)
Neutrophils Relative %: 82 %
Platelet Count: 168 K/uL (ref 150–400)
RBC: 4.52 MIL/uL (ref 3.87–5.11)
RDW: 13.9 % (ref 11.5–15.5)
WBC Count: 7 K/uL (ref 4.0–10.5)
nRBC: 0 % (ref 0.0–0.2)

## 2023-12-21 MED ORDER — CIPROFLOXACIN HCL 0.3 % OP SOLN: 1.0000 [drp] | OPHTHALMIC | 0 refills | Status: DC

## 2023-12-21 MED ORDER — VITAMIN K2-VITAMIN D3 90-125 MCG PO CAPS: 1.0000 | ORAL_CAPSULE | Freq: Every day | ORAL | Status: AC

## 2023-12-21 NOTE — Progress Notes (Signed)
 Patient Care Team: Alvan Dorothyann BIRCH, MD as PCP - General (Family Medicine) Odean Potts, MD as Consulting Physician (Hematology and Oncology)  DIAGNOSIS:  Encounter Diagnosis  Name Primary?   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) Yes    SUMMARY OF ONCOLOGIC HISTORY: Oncology History  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)  1995 Initial Biopsy   Right breast cancer stage IIb ER positive IDC s/p mastectomy and 2 months of tamoxifen, lost to follow-up (treatment at [facility] )   03/12/2020 - 03/26/2020 Radiation Therapy   Palliative radiation to the brain and spine for brain metastases   03/14/2020 Initial Diagnosis   Scans on 03/14/2020: Multiple bone metastases and brain metastases, biopsy of the rib: Breast cancer ER 80% PR negative HER-2 negative.  Moved to [facility] December 2021 to be closer to her family   02/2020 -  Anti-estrogen oral therapy   Anastrozole  daily   10/21/2020 Treatment Plan Change   Xgeva  every 3 months   10/06/2022 Imaging   IMPRESSION: 1. Slight interval enlargement of bulky right subpectoral lymph nodes, consistent with worsened nodal metastatic disease. 2. Multiple unchanged mixed lytic and sclerotic osseous metastatic lesions, notable for a large, expansile lesion of the right second rib as well as numerous additional expansile rib lesions, thoracolumbar vertebral body lesions, and pelvic lesions. 3. No other evidence of metastatic disease in the chest, abdomen, or pelvis. 4. Coronary artery disease. 5. Aortic valve calcifications. Correlate for echocardiographic evidence of aortic valve dysfunction.   Aortic Atherosclerosis (ICD10-I70.0).     Electronically Signed   By: [name]   On: 10/08/2022 07:16   10/15/2022 Cancer Staging   Staging form: Breast, AJCC 8th Edition - Clinical: Stage IV (pM1) - Signed by Crawford Morna Pickle, NP on 10/15/2022     CHIEF COMPLIANT:  Follow-up on Verzinio with letrozole   HISTORY OF PRESENT ILLNESS:  History of Present Illness Jodi  Kaufman is an 83 year old female with a history of skin cancer who presents with eye discharge and vision changes.  She has a 'brown coat' discharge from her eye that began on Sunday or Monday, affecting her vision. The discharge is 'gooey', and she suspects an infection. A dermatologist suggested it might be skin cancer, and a biopsy was performed with results pending.  Her current medications include Nutrisa, letrozole , and Verzenio . She experiences minor stomach aches in the morning after taking letrozole , which resolve later in the day. She has a history of diabetes and is concerned about its impact on her liver. No chills, sweating, or feeling sick.     ALLERGIES:  is allergic to atorvastatin, bactrim  [sulfamethoxazole -trimethoprim ], clavulanic acid, guaifenesin & derivatives, and tamoxifen.  MEDICATIONS:  Current Outpatient Medications  Medication Sig Dispense Refill   abemaciclib  (VERZENIO ) 100 MG tablet Take 1 tablet (100 mg total) by mouth 2 (two) times daily. 60 tablet 3   acetaminophen  (TYLENOL ) 160 MG/5ML liquid Take 500 mg by mouth every 4 (four) hours as needed for fever.     albuterol  (VENTOLIN  HFA) 108 (90 Base) MCG/ACT inhaler Inhale 2 puffs into the lungs every 6 (six) hours as needed for wheezing. 8.5 g 11   letrozole  (FEMARA ) 2.5 MG tablet Take 1 tablet (2.5 mg total) by mouth daily. 90 tablet 3   LORazepam  (ATIVAN ) 0.5 MG tablet Take 1 tablet 1 hour before scan- may repeat at time of scan if needed. (Patient not taking: Reported on 11/22/2023) 10 tablet 0   Spacer/Aero-Holding Chambers (AEROCHAMBER  PLUS WITH MASK) inhaler Use with inhaler 1 each 3   No current facility-administered medications for this visit.    PHYSICAL EXAMINATION: ECOG PERFORMANCE STATUS: 1 - Symptomatic but completely ambulatory  Vitals:   12/21/23 1041  BP: (!) 108/58  Pulse: 90  Resp: 17   Temp: 98 F (36.7 C)  SpO2: 99%   Filed Weights   12/21/23 1041  Weight: 134 lb 12.8 oz (61.1 kg)      LABORATORY DATA:  I have reviewed the data as listed    Latest Ref Rng & Units 11/29/2023   10:46 AM 10/25/2023   11:12 AM 07/22/2023   10:51 AM  CMP  Glucose 70 - 99 mg/dL 894  894  99   BUN 8 - 23 mg/dL 30  30  31    Creatinine 0.44 - 1.00 mg/dL 9.15  9.17  9.16   Sodium 135 - 145 mmol/L 137  139  142   Potassium 3.5 - 5.1 mmol/L 4.6  4.6  4.5   Chloride 98 - 111 mmol/L 103  103  106   CO2 22 - 32 mmol/L 30  29  31    Calcium 8.9 - 10.3 mg/dL 9.4  9.7  9.5   Total Protein 6.5 - 8.1 g/dL 6.9  7.0  7.1   Total Bilirubin 0.0 - 1.2 mg/dL 0.8  0.8  0.8   Alkaline Phos 38 - 126 U/L 47  52  60   AST 15 - 41 U/L 26  24  25    ALT 0 - 44 U/L 18  17  19      Lab Results  Component Value Date   WBC 7.0 12/21/2023   HGB 14.6 12/21/2023   HCT 43.2 12/21/2023   MCV 95.6 12/21/2023   PLT 168 12/21/2023   NEUTROABS 5.8 12/21/2023    ASSESSMENT & PLAN:  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 Now presenting with metastatic disease based on scans done 03/11/2020 CT CAP: Numerous lytic lesions in the thoracic spine several with expansion extraosseous extension. 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  Uncertain if any radiation was given to the skull lesions.   Current Treatment:   letrozole . Started December 2021, discontinued for progression and switched to Faslodex  10/15/2022   Poor nutritional status: Since the tube feedings have started her weight is improving.  Energy levels are improving and she is got a better positive outlook.    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  No  evidence of visceral metastatic disease. CT CAP 12/30/2022: No significant interval change in the bone metastases.  Stable bulky subpectoral lymph nodes. CT CAP 04/29/2023: No significant interval change in the bone metastasis, no change in the bulky right subpectoral lymph nodes, new hypodense foci in the cecum (?  Ingested material or tablets) Guardant360 05/11/2023: PIK3CA mutation, RAF 1 mutation, TMB 21.35, MSI not detected I had previously discussed with her about different options for treatment including Verzinio, Capivasertib or Keytruda immunotherapy.  CT CAP 08/18/2023: Slight progression of subpectoral lymph node from 6.1 cm to 6.4 cm, stable bone metastases  Current treatment: Verzinio with letrozole  started 12/14/2023 Toxicities: Constipation: She is using MiraLAX  Eye discharge: I sent a prescription for ciprofloxacin  eyedrops Fatigue  She is taking herbal supplement  for brain health and would like us  to verify if the ingredients are safe.  She will bring the bottle to her appointment in 2 weeks with Norleen. Return to clinic in 2 weeks for labs and follow-up with Norleen   ------------------------------------- Assessment and Plan Assessment & Plan Metastatic breast cancer On Abemaciclib  and Letrozole  with manageable side effects. Constipation managed with Miralax . Minor stomach aches with Letrozole  resolve post-administration. Blood counts stable. Liver function tests pending. Tolerating treatment well. Discussed Verzenio  administration with food and tube feeding for absorption. - Continue Abemaciclib  and Letrozole  as prescribed. - Monitor blood counts and liver function tests. - Manage constipation with Miralax  as needed. - Encourage dietary adjustments, such as coffee, to aid bowel movements.  Eye infection Brown discharge affecting vision, indicating infection. - Prescribe antibiotic eye drops and send prescription to [facility] in [place].  Constipation Constipation with spastic  bowels and fecal impaction. Managed with Miralax  and dietary adjustments. - Continue Miralax  as needed for constipation. - Encourage dietary adjustments, such as coffee, to aid bowel movements.  Dizziness Slight dizziness linked to Letrozole , Brain Zone, and multivitamin. Resolved after discontinuing supplements. - Avoid taking Brain Zone and multivitamin with Letrozole .      No orders of the defined types were placed in this encounter.  The patient has a good understanding of the overall plan. she agrees with it. she will call with any problems that may develop before the next visit here. Total time spent: 30 mins including face to face time and time spent for planning, charting and co-ordination of care   Naomi MARLA Chad, MD 12/21/23

## 2023-12-21 NOTE — Assessment & Plan Note (Signed)
 Originally diagnosed in 1995, patient did not take prescribed tamoxifen. Now presenting with metastatic disease based on scans done 03/11/2020 CT CAP: Numerous lytic lesions in the thoracic spine several with expansion extraosseous extension. 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  Uncertain if any radiation was given to the skull lesions.   Current Treatment:   letrozole . Started December 2021, discontinued for progression and switched to Faslodex  10/15/2022   Poor nutritional status: Since the tube feedings have started her weight is improving.  Energy levels are improving and she is got a better positive outlook.    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  No evidence of visceral metastatic disease. CT CAP 12/30/2022: No significant interval change in the bone metastases.  Stable bulky subpectoral lymph nodes. CT CAP 04/29/2023: No significant interval change in the bone metastasis, no change in the bulky right subpectoral lymph nodes, new hypodense foci in the cecum (?  Ingested material or tablets) Guardant360 05/11/2023: PIK3CA mutation, RAF 1 mutation, TMB 21.35, MSI not detected I had previously discussed with her about different options for treatment including Verzinio, Capivasertib or Keytruda immunotherapy.  CT CAP 08/18/2023: Slight progression of subpectoral lymph node from 6.1 cm to 6.4 cm, stable bone metastases  Current treatment: Verzinio with letrozole  started 12/14/2023 Toxicities:  Return to clinic in 1 month for labs and follow-up with Norleen

## 2024-01-03 ENCOUNTER — Other Ambulatory Visit: Payer: Self-pay

## 2024-01-03 ENCOUNTER — Encounter: Payer: Self-pay | Admitting: Hematology and Oncology

## 2024-01-03 ENCOUNTER — Other Ambulatory Visit (HOSPITAL_COMMUNITY): Payer: Self-pay

## 2024-01-04 ENCOUNTER — Other Ambulatory Visit: Payer: Self-pay

## 2024-01-04 ENCOUNTER — Encounter: Payer: Self-pay | Admitting: Hematology and Oncology

## 2024-01-04 ENCOUNTER — Other Ambulatory Visit (HOSPITAL_COMMUNITY): Payer: Self-pay

## 2024-01-06 ENCOUNTER — Inpatient Hospital Stay: Admitting: Pharmacist

## 2024-01-06 ENCOUNTER — Inpatient Hospital Stay: Attending: Hematology and Oncology

## 2024-01-06 ENCOUNTER — Inpatient Hospital Stay

## 2024-01-06 VITALS — BP 104/66 | HR 92 | Temp 97.4°F | Resp 18 | Ht 66.0 in | Wt 135.7 lb

## 2024-01-06 DIAGNOSIS — C7951 Secondary malignant neoplasm of bone: Secondary | ICD-10-CM | POA: Diagnosis present

## 2024-01-06 DIAGNOSIS — Z170 Estrogen receptor positive status [ER+]: Secondary | ICD-10-CM | POA: Diagnosis not present

## 2024-01-06 DIAGNOSIS — C50911 Malignant neoplasm of unspecified site of right female breast: Secondary | ICD-10-CM | POA: Diagnosis present

## 2024-01-06 DIAGNOSIS — C50919 Malignant neoplasm of unspecified site of unspecified female breast: Secondary | ICD-10-CM

## 2024-01-06 LAB — CMP (CANCER CENTER ONLY)
ALT: 19 U/L (ref 0–44)
AST: 26 U/L (ref 15–41)
Albumin: 4.2 g/dL (ref 3.5–5.0)
Alkaline Phosphatase: 51 U/L (ref 38–126)
Anion gap: 4 — ABNORMAL LOW (ref 5–15)
BUN: 35 mg/dL — ABNORMAL HIGH (ref 8–23)
CO2: 31 mmol/L (ref 22–32)
Calcium: 9.7 mg/dL (ref 8.9–10.3)
Chloride: 104 mmol/L (ref 98–111)
Creatinine: 1.17 mg/dL — ABNORMAL HIGH (ref 0.44–1.00)
GFR, Estimated: 47 mL/min — ABNORMAL LOW (ref >60)
Glucose, Bld: 98 mg/dL (ref 70–99)
Potassium: 5 mmol/L (ref 3.5–5.1)
Sodium: 139 mmol/L (ref 135–145)
Total Bilirubin: 0.7 mg/dL (ref 0.0–1.2)
Total Protein: 7.1 g/dL (ref 6.5–8.1)

## 2024-01-06 LAB — CBC WITH DIFFERENTIAL (CANCER CENTER ONLY)
Abs Immature Granulocytes: 0.01 K/uL (ref 0.00–0.07)
Basophils Absolute: 0.1 K/uL (ref 0.0–0.1)
Basophils Relative: 1 %
Eosinophils Absolute: 0.1 K/uL (ref 0.0–0.5)
Eosinophils Relative: 3 %
HCT: 43 % (ref 36.0–46.0)
Hemoglobin: 14.2 g/dL (ref 12.0–15.0)
Immature Granulocytes: 0 %
Lymphocytes Relative: 14 %
Lymphs Abs: 0.5 K/uL — ABNORMAL LOW (ref 0.7–4.0)
MCH: 31.9 pg (ref 26.0–34.0)
MCHC: 33 g/dL (ref 30.0–36.0)
MCV: 96.6 fL (ref 80.0–100.0)
Monocytes Absolute: 0.3 K/uL (ref 0.1–1.0)
Monocytes Relative: 9 %
Neutro Abs: 2.6 K/uL (ref 1.7–7.7)
Neutrophils Relative %: 73 %
Platelet Count: 155 K/uL (ref 150–400)
RBC: 4.45 MIL/uL (ref 3.87–5.11)
RDW: 14.5 % (ref 11.5–15.5)
WBC Count: 3.6 K/uL — ABNORMAL LOW (ref 4.0–10.5)
nRBC: 0 % (ref 0.0–0.2)

## 2024-01-06 NOTE — Progress Notes (Signed)
 [name]       Telephone: [phone]?Fax: [phone]   Oncology Clinical Pharmacist Practitioner Progress Note  Edit  Jodi Kaufman was contacted via in-person to discuss her chemotherapy regimen for abemaciclib  which they receive under the care of Dr. Vinay Jodi Kaufman.  Current treatment regimen and start date Abemaciclib  (12/02/23) Letrozole  (10/21/20) Denosumab  120 mg (10/21/20)  Interval History She continues on abemaciclib  100 mg by mouth every 12 hours on days 1 to 28 of a 28-day cycle. This is being given in combination with letrozole  and denosumab  120 mg. Therapy is planned to continue until disease progression or unacceptable toxicity. She last saw Dr. Gudena on 12/21/23 and clinical pharmacy on 11/29/23 (telephone visit 12/13/23). 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 The drainage she had when she saw Dr. Gudena last has cleared up with ciprofloxacin  eye drops.  Response to Therapy She continues to tolerate the abemaciclib   well. Her main issue currently is drinking enough fluids. Her kidney function is stable but we discussed options such as electrolyte containing beverages or flavored waters. She is able to drink fluids by mouth.  We reviewed her Brain Zone supplement which had b vitamins in very high amounts. She is already taking a multi-vitamin that has these same vitamins at 100% of her daily value so it is unlikely she would need to take the Brain Zone which we discussed today. We also reviewed that they are not regulated by the FDA and their purity cannot be guaranteed like prescription medications.  Labs, vitals, treatment parameters, and manufacturer guidelines assessing toxicity were reviewed with Jodi  Kaufman today. Based on these values, patient is in agreement to continue abemaciclib  therapy at this time.  Allergies Allergies  Allergen Reactions   Atorvastatin     Other reaction(s):  Other (comments) Side effects   Bactrim  [Sulfamethoxazole -Trimethoprim ] Other (See Comments)    Dizziness,blurred vision    Clavulanic Acid Hives   Guaifenesin & Derivatives Other (See Comments)    Stomach upset, severe  Patient states she is not allergic.    Tamoxifen Nausea Only    Vitals    12/21/2023   10:41 AM 11/29/2023   11:09 AM 11/22/2023   11:39 AM  Oncology Vitals  Height   168 cm  Weight 61.145 kg 61.553 kg 61.598 kg  Weight (lbs) 134 lbs 13 oz 135 lbs 11 oz 135 lbs 13 oz  BMI 21.76 kg/m2 21.9 kg/m2 21.92 kg/m2  Temp 98 F (36.7 C) 99.7 F (37.6 C) 98.2 F (36.8 C)  Pulse Rate 90 85 88  BP 108/58 107/57 103/61  Resp 17 18 16   SpO2 99 % 99 % 99 %  BSA (m2) 1.69 m2 1.69 m2 1.69 m2    Laboratory Data    Latest Ref Rng & Units 12/21/2023   10:25 AM 11/29/2023   10:46 AM 10/25/2023   11:12 AM  CBC EXTENDED  WBC 4.0 - 10.5 K/uL 7.0  6.1  5.5   RBC 3.87 - 5.11 MIL/uL 4.52  4.58  4.56   Hemoglobin 12.0 - 15.0 g/dL 85.3  85.3  85.4   HCT 36.0 - 46.0 % 43.2  43.3  43.1   Platelets 150 - 400 K/uL 168  186  176   NEUT# 1.7 - 7.7 K/uL 5.8  4.8  4.1   Lymph# 0.7 - 4.0 K/uL 0.5  0.5  0.6  Latest Ref Rng & Units 12/21/2023   10:25 AM 11/29/2023   10:46 AM 10/25/2023   11:12 AM  CMP  Glucose 70 - 99 mg/dL 897  894  894   BUN 8 - 23 mg/dL 32  30  30   Creatinine 0.44 - 1.00 mg/dL 8.82  9.15  9.17   Sodium 135 - 145 mmol/L 138  137  139   Potassium 3.5 - 5.1 mmol/L 4.7  4.6  4.6   Chloride 98 - 111 mmol/L 103  103  103   CO2 22 - 32 mmol/L 30  30  29    Calcium 8.9 - 10.3 mg/dL 9.5  9.4  9.7   Total Protein 6.5 - 8.1 g/dL 6.7  6.9  7.0   Total Bilirubin 0.0 - 1.2 mg/dL 0.8  0.8  0.8   Alkaline Phos 38 - 126 U/L 52  47  52   AST 15 - 41 U/L 27  26  24    ALT 0 - 44 U/L 23  18  17      Lab Results  Component Value Date   MG 1.9 05/10/2020   MG 1.7 04/18/2020   MG 1.6 (L) 04/16/2020   Lab Results  Component Value Date   CA2729 564.9 (H) 11/29/2023   CA2729  427.7 (H) 07/22/2023     Adverse Effects Assessment Serum creatinine: elevated but stable from last visit. Encouraged fluid intake. Monitor ANC: lower than last visit but WNL. Hold for Grade 3 toxicities  Adherence Assessment Jodi  Kaufman reports missing 0 doses over the past 4 weeks.   Reason for missed dose: N/A Patient was re-educated on importance of adherence.   Access Assessment Jodi  Kaufman is currently receiving her abemaciclib  through [facility] concerns:  none  Medication Reconciliation The patient's medication list was reviewed today with the patient? Yes New medications or herbal supplements have recently been started? No  Any medications have been discontinued? No  The medication list was updated and reconciled based on the patient's most recent medication list in the electronic medical record (EMR) including herbal products and OTC medications.   Medications Current Outpatient Medications  Medication Sig Dispense Refill   abemaciclib  (VERZENIO ) 100 MG tablet Take 1 tablet (100 mg total) by mouth 2 (two) times daily. 60 tablet 3   acetaminophen  (TYLENOL ) 160 MG/5ML liquid Take 500 mg by mouth every 4 (four) hours as needed for fever.     albuterol  (VENTOLIN  HFA) 108 (90 Base) MCG/ACT inhaler Inhale 2 puffs into the lungs every 6 (six) hours as needed for wheezing. 8.5 g 11   ciprofloxacin  (CILOXAN ) 0.3 % ophthalmic solution Place 1 drop into the left eye every 2 (two) hours. Administer 1 drop, every 2 hours, while awake, for 2 days. Then 1 drop, every 4 hours, while awake, for the next 5 days. 10 mL 0   letrozole  (FEMARA ) 2.5 MG tablet Take 1 tablet (2.5 mg total) by mouth daily. 90 tablet 3   LORazepam  (ATIVAN ) 0.5 MG tablet Take 1 tablet 1 hour before scan- may repeat at time of scan if needed. (Patient not taking: Reported on 11/22/2023) 10 tablet 0   Spacer/Aero-Holding Chambers (AEROCHAMBER PLUS WITH MASK) inhaler Use with inhaler 1 each  3   Vitamin D -Vitamin K (VITAMIN K2 -VITAMIN D3) 90-125 MCG CAPS Take 1 capsule by mouth daily at 12 noon.     No current facility-administered medications for this visit.    Drug-Drug Interactions (DDIs) DDIs were evaluated? Yes  Significant DDIs? No  The patient was instructed to speak with their health care provider and/or the oral chemotherapy pharmacist before starting any new drug, including prescription or over the counter, natural / herbal products, or vitamins.  Supportive Care Diarrhea: we reviewed that diarrhea is common with abemaciclib  and confirmed that she does have loperamide (Imodium) at home.  We reviewed how to take this medication PRN. Neutropenia: we discussed the importance of having a thermometer and what the Centers for Disease Control and Prevention (CDC) considers a fever which is 100.15F (38C) or higher.  Gave patient 24/7 triage line to call if any fevers or symptoms. ILD/Pneumonitis: we reviewed potential symptoms including cough, shortness, and fatigue.  VTE: reviewed signs of DVT such as leg swelling, redness, pain, or tenderness and signs of PE such as shortness of breath, rapid or irregular heartbeat, cough, chest pain, or lightheadedness. Reviewed to take the medication every 12 hours (with food sometimes can be easier on the stomach) and to take it at the same time every day. Hepatotoxicity:WNL Drug interactions with grapefruit products  Dosing Assessment Hepatic adjustments needed? No  Renal adjustments needed? No  Toxicity adjustments needed? No  The current dosing regimen is appropriate to continue at this time.  Follow-Up Plan Continue abemaciclib  100 mg by mouth every 12 hours via G-tube.  Continue letrozole  2.5 mg by mouth daily Continue denosumab  120 mg SubQ every 12 weeks, next due 01/17/24 Monitor kidney function Monitor ANC Will add labs, Dr. Odean visit, and denosumab  120 mg in 2 weeks Lab, Dr. Odean visit on 02/02/24 Last imaging was CT  CAP on 11/16/23. Starting abemaciclib  on 12/02/23. Jodi  Kaufman can follow up with clinical pharmacy as deemed necessary by Dr. Vinay Jodi Kaufman going forward   Jodi  Kaufman participated in the discussion, expressed understanding, and voiced agreement with the above plan. All questions were answered to her satisfaction. The patient was advised to contact the clinic at [phone] with any questions or concerns prior to her return visit.   I spent 30 minutes assessing and educating the patient.  Jodi Kaufman A. Lucila, PharmD, BCOP, CPP  [name], RPH-CPP, 01/06/2024  1:45 PM   **Disclaimer: This note was dictated with voice recognition software. Similar sounding words can inadvertently be transcribed and this note may contain transcription errors which may not have been corrected upon publication of note.**

## 2024-01-20 ENCOUNTER — Inpatient Hospital Stay

## 2024-01-20 ENCOUNTER — Telehealth: Payer: Self-pay

## 2024-01-20 ENCOUNTER — Inpatient Hospital Stay (HOSPITAL_BASED_OUTPATIENT_CLINIC_OR_DEPARTMENT_OTHER): Admitting: Hematology and Oncology

## 2024-01-20 VITALS — BP 110/65 | HR 90 | Temp 98.4°F | Resp 19 | Wt 135.7 lb

## 2024-01-20 DIAGNOSIS — Z170 Estrogen receptor positive status [ER+]: Secondary | ICD-10-CM | POA: Diagnosis not present

## 2024-01-20 DIAGNOSIS — C50919 Malignant neoplasm of unspecified site of unspecified female breast: Secondary | ICD-10-CM

## 2024-01-20 DIAGNOSIS — C50511 Malignant neoplasm of lower-outer quadrant of right female breast: Secondary | ICD-10-CM

## 2024-01-20 DIAGNOSIS — C7951 Secondary malignant neoplasm of bone: Secondary | ICD-10-CM | POA: Diagnosis not present

## 2024-01-20 DIAGNOSIS — R42 Dizziness and giddiness: Secondary | ICD-10-CM

## 2024-01-20 LAB — CBC WITH DIFFERENTIAL (CANCER CENTER ONLY)
Abs Immature Granulocytes: 0.01 K/uL (ref 0.00–0.07)
Basophils Absolute: 0.1 K/uL (ref 0.0–0.1)
Basophils Relative: 1 %
Eosinophils Absolute: 0.1 K/uL (ref 0.0–0.5)
Eosinophils Relative: 3 %
HCT: 41.6 % (ref 36.0–46.0)
Hemoglobin: 14.1 g/dL (ref 12.0–15.0)
Immature Granulocytes: 0 %
Lymphocytes Relative: 13 %
Lymphs Abs: 0.5 K/uL — ABNORMAL LOW (ref 0.7–4.0)
MCH: 32.9 pg (ref 26.0–34.0)
MCHC: 33.9 g/dL (ref 30.0–36.0)
MCV: 97.2 fL (ref 80.0–100.0)
Monocytes Absolute: 0.4 K/uL (ref 0.1–1.0)
Monocytes Relative: 11 %
Neutro Abs: 2.6 K/uL (ref 1.7–7.7)
Neutrophils Relative %: 72 %
Platelet Count: 198 K/uL (ref 150–400)
RBC: 4.28 MIL/uL (ref 3.87–5.11)
RDW: 15.4 % (ref 11.5–15.5)
WBC Count: 3.5 K/uL — ABNORMAL LOW (ref 4.0–10.5)
nRBC: 0 % (ref 0.0–0.2)

## 2024-01-20 LAB — CMP (CANCER CENTER ONLY)
ALT: 26 U/L (ref 0–44)
AST: 29 U/L (ref 15–41)
Albumin: 4.1 g/dL (ref 3.5–5.0)
Alkaline Phosphatase: 61 U/L (ref 38–126)
Anion gap: 3 — ABNORMAL LOW (ref 5–15)
BUN: 32 mg/dL — ABNORMAL HIGH (ref 8–23)
CO2: 32 mmol/L (ref 22–32)
Calcium: 9.6 mg/dL (ref 8.9–10.3)
Chloride: 104 mmol/L (ref 98–111)
Creatinine: 1.13 mg/dL — ABNORMAL HIGH (ref 0.44–1.00)
GFR, Estimated: 49 mL/min — ABNORMAL LOW (ref >60)
Glucose, Bld: 106 mg/dL — ABNORMAL HIGH (ref 70–99)
Potassium: 5.1 mmol/L (ref 3.5–5.1)
Sodium: 139 mmol/L (ref 135–145)
Total Bilirubin: 0.6 mg/dL (ref 0.0–1.2)
Total Protein: 7 g/dL (ref 6.5–8.1)

## 2024-01-20 MED ORDER — DENOSUMAB 120 MG/1.7ML ~~LOC~~ SOLN
120.0000 mg | Freq: Once | SUBCUTANEOUS | Status: AC
  Administered 2024-01-20: 120 mg via SUBCUTANEOUS
  Filled 2024-01-20: qty 1.7

## 2024-01-20 NOTE — Progress Notes (Signed)
 Patient Care Team: Alvan Dorothyann BIRCH, MD as PCP - General (Family Medicine) Odean Potts, MD as Consulting Physician (Hematology and Oncology)  DIAGNOSIS:  Encounter Diagnosis  Name Primary?   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) Yes    SUMMARY OF ONCOLOGIC HISTORY: Oncology History  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)  1995 Initial Biopsy   Right breast cancer stage IIb ER positive IDC s/p mastectomy and 2 months of tamoxifen, lost to follow-up (treatment at [facility] )   03/12/2020 - 03/26/2020 Radiation Therapy   Palliative radiation to the brain and spine for brain metastases   03/14/2020 Initial Diagnosis   Scans on 03/14/2020: Multiple bone metastases and brain metastases, biopsy of the rib: Breast cancer ER 80% PR negative HER-2 negative.  Moved to [facility] December 2021 to be closer to her family   02/2020 -  Anti-estrogen oral therapy   Anastrozole  daily   10/21/2020 Treatment Plan Change   Xgeva  every 3 months   10/06/2022 Imaging   IMPRESSION: 1. Slight interval enlargement of bulky right subpectoral lymph nodes, consistent with worsened nodal metastatic disease. 2. Multiple unchanged mixed lytic and sclerotic osseous metastatic lesions, notable for a large, expansile lesion of the right second rib as well as numerous additional expansile rib lesions, thoracolumbar vertebral body lesions, and pelvic lesions. 3. Jodi other evidence of metastatic disease in the chest, abdomen, or pelvis. 4. Coronary artery disease. 5. Aortic valve calcifications. Correlate for echocardiographic evidence of aortic valve dysfunction.   Aortic Atherosclerosis (ICD10-I70.0).     Electronically Signed   By: [name]   On: 10/08/2022 07:16   10/15/2022 Cancer Staging   Staging form: Breast, AJCC 8th Edition - Clinical: Stage IV (pM1) - Signed by Crawford Morna Pickle, NP on 10/15/2022     CHIEF COMPLIANT:    HISTORY OF Kaufman ILLNESS:   History of Kaufman Illness Noheli  Weseman is an 83 year old female who presents with worsening dizziness and vertigo. She is accompanied by her daughter.  She experiences dizziness and vertigo, described as 'room spinning' and 'unbalanced' sensations, which have worsened over time. These symptoms typically occur after taking letrozole  and can last throughout the day, causing significant unsteadiness and impacting her daily activities. A severe episode involved an inability to lift her head off the pillow, with a sensation of her head spinning for about 15-20 minutes.  She is currently taking Verzenio  and letrozole , with dizziness associated with the latter. She has discontinued certain medications due to severe side effects, including immobility upon waking. Her routine includes tube feeding, after which dizziness often follows.  She reports significant stiffness and difficulty in her left leg, which she attributes to a lack of exercise. Increased physical activity, such as walking, alleviates some of the stiffness. She desires more exercise but currently engages in minimal physical activity due to her health conditions. She is diabetic and acknowledges the need for more physical activity. Jodi recent changes in vision or hearing, but she notes a history of sinus issues.     ALLERGIES:  is allergic to atorvastatin, bactrim  [sulfamethoxazole -trimethoprim ], clavulanic acid, guaifenesin & derivatives, and tamoxifen.  MEDICATIONS:  Current Outpatient Medications  Medication Sig Dispense Refill   abemaciclib  (VERZENIO ) 100 MG tablet Take 1 tablet (100 mg total) by mouth 2 (two) times daily. 60 tablet 3   acetaminophen  (TYLENOL ) 160 MG/5ML liquid Take 500 mg by mouth every 4 (four) hours as needed for fever.     albuterol  (VENTOLIN   HFA) 108 (90 Base) MCG/ACT inhaler Inhale 2 puffs into the lungs every 6 (six) hours as needed for wheezing. 8.5 g 11   ciprofloxacin   (CILOXAN ) 0.3 % ophthalmic solution Place 1 drop into the left eye every 2 (two) hours. Administer 1 drop, every 2 hours, while awake, for 2 days. Then 1 drop, every 4 hours, while awake, for the next 5 days. 10 mL 0   letrozole  (FEMARA ) 2.5 MG tablet Take 1 tablet (2.5 mg total) by mouth daily. 90 tablet 3   LORazepam  (ATIVAN ) 0.5 MG tablet Take 1 tablet 1 hour before scan- may repeat at time of scan if needed. 10 tablet 0   Spacer/Aero-Holding Chambers (AEROCHAMBER PLUS WITH MASK) inhaler Use with inhaler 1 each 3   Vitamin D -Vitamin K (VITAMIN K2 -VITAMIN D3) 90-125 MCG CAPS Take 1 capsule by mouth daily at 12 noon.     Jodi current facility-administered medications for this visit.    PHYSICAL EXAMINATION: ECOG PERFORMANCE STATUS: 1 - Symptomatic but completely ambulatory  Vitals:   01/20/24 1332  BP: 110/65  Pulse: 90  Resp: 19  Temp: 98.4 F (36.9 C)  SpO2: 97%   Filed Weights   01/20/24 1332  Weight: 135 lb 11.2 oz (61.6 kg)    Physical Exam   (exam performed in the presence of a chaperone)  LABORATORY DATA:  I have reviewed the data as listed    Latest Ref Rng & Units 01/06/2024    1:42 PM 12/21/2023   10:25 AM 11/29/2023   10:46 AM  CMP  Glucose 70 - 99 mg/dL 98  897  894   BUN 8 - 23 mg/dL 35  32  30   Creatinine 0.44 - 1.00 mg/dL 8.82  8.82  9.15   Sodium 135 - 145 mmol/L 139  138  137   Potassium 3.5 - 5.1 mmol/L 5.0  4.7  4.6   Chloride 98 - 111 mmol/L 104  103  103   CO2 22 - 32 mmol/L 31  30  30    Calcium 8.9 - 10.3 mg/dL 9.7  9.5  9.4   Total Protein 6.5 - 8.1 g/dL 7.1  6.7  6.9   Total Bilirubin 0.0 - 1.2 mg/dL 0.7  0.8  0.8   Alkaline Phos 38 - 126 U/L 51  52  47   AST 15 - 41 U/L 26  27  26    ALT 0 - 44 U/L 19  23  18      Lab Results  Component Value Date   WBC 3.5 (L) 01/20/2024   HGB 14.1 01/20/2024   HCT 41.6 01/20/2024   MCV 97.2 01/20/2024   PLT 198 01/20/2024   NEUTROABS 2.6 01/20/2024    ASSESSMENT & PLAN:  Primary malignant neoplasm  of breast with metastasis (HCC) Originally diagnosed in 1995, patient did not take prescribed tamoxifen. Now presenting with metastatic disease based on scans done 03/11/2020 CT CAP: Numerous lytic lesions in the thoracic spine several with expansion extraosseous extension. Extraosseous extension of thoracic spine metastases with thecal sac distortion at T7 and T11 Brain MRI: Jodi parenchymal brain metastases but skull metastases were noted Palliative radiation was given to the spine.  Uncertain if any radiation was given to the skull lesions.   Current Treatment:   letrozole . Started December 2021, discontinued for progression and switched to Faslodex  10/15/2022   Poor nutritional status: Since the tube feedings have started her weight is improving.  Energy levels are improving and she is got  a better positive outlook.    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  Jodi evidence of visceral metastatic disease. CT CAP 12/30/2022: Jodi significant interval change in the bone metastases.  Stable bulky subpectoral lymph nodes. CT CAP 04/29/2023: Jodi significant interval change in the bone metastasis, Jodi change in the bulky right subpectoral lymph nodes, new hypodense foci in the cecum (?  Ingested material or tablets) Guardant360 05/11/2023: PIK3CA mutation, RAF 1 mutation, TMB 21.35, MSI not detected I had previously discussed with her about different options for treatment including Verzinio, Capivasertib or Keytruda immunotherapy.  CT CAP 08/18/2023: Slight progression of subpectoral lymph node from 6.1 cm to 6.4 cm, stable bone metastases   Current treatment: Verzinio with letrozole  started 12/14/2023 Toxicities: Constipation: She is using MiraLAX  Eye discharge: I sent a prescription for ciprofloxacin  eyedrops Fatigue Scans to be performed in end of October 2025  Dizziness and vertigo: Will obtain a CT of the head.  She cannot tolerate MRI.   I will call her with  results 1 week after the CT head CT CAP will be done end of October. In person follow-up 1 week after that. ------------------------------------- Assessment and Plan Assessment & Plan Metastatic breast cancer Metastatic breast cancer with ongoing treatment. Letrozole  suspected to cause dizziness. Blood work indicates good medication tolerance. - Order CT scan of the head in one week to assess for neurological issues related to dizziness. - Order CT scan of the chest, abdomen, and pelvis at the end of October to monitor cancer progression. - Continue Abemaciclib  and Letrozole . - Monitor blood counts monthly.  Benign paroxysmal positional vertigo Vertigo likely due to benign paroxysmal positional vertigo, unrelated to cancer. - Educated on Epley maneuver. - Consider brain CT if vertigo persists.  Deconditioning Deconditioning due to reduced physical activity, causing stiffness and difficulty walking. - Encourage regular physical activity, including walking and leg exercises. - Use exercise bands for resistance training. - Consider physical therapy.      Jodi orders of the defined types were placed in this encounter.  The patient has a good understanding of the overall plan. she agrees with it. she will call with any problems that may develop before the next visit here. Total time spent: 30 mins including face to face time and time spent for planning, charting and co-ordination of care   Viinay K Codylee Patil, MD 01/20/24

## 2024-01-20 NOTE — Assessment & Plan Note (Signed)
 Originally diagnosed in 1995, patient did not take prescribed tamoxifen. Now presenting with metastatic disease based on scans done 03/11/2020 CT CAP: Numerous lytic lesions in the thoracic spine several with expansion extraosseous extension. 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  Uncertain if any radiation was given to the skull lesions.   Current Treatment:   letrozole . Started December 2021, discontinued for progression and switched to Faslodex  10/15/2022   Poor nutritional status: Since the tube feedings have started her weight is improving.  Energy levels are improving and she is got a better positive outlook.    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  No evidence of visceral metastatic disease. CT CAP 12/30/2022: No significant interval change in the bone metastases.  Stable bulky subpectoral lymph nodes. CT CAP 04/29/2023: No significant interval change in the bone metastasis, no change in the bulky right subpectoral lymph nodes, new hypodense foci in the cecum (?  Ingested material or tablets) Guardant360 05/11/2023: PIK3CA mutation, RAF 1 mutation, TMB 21.35, MSI not detected I had previously discussed with her about different options for treatment including Verzinio, Capivasertib or Keytruda immunotherapy.  CT CAP 08/18/2023: Slight progression of subpectoral lymph node from 6.1 cm to 6.4 cm, stable bone metastases   Current treatment: Verzinio with letrozole  started 12/14/2023 Toxicities: Constipation: She is using MiraLAX  Eye discharge: I sent a prescription for ciprofloxacin  eyedrops Fatigue Scans to be performed in November 2025

## 2024-01-20 NOTE — Telephone Encounter (Signed)
 Pt called upset regarding a phone visit that was scheduled. She prefers to see MD in office. Dr Gudena would like for her to have a CT of her head in one week, THEN one week later have a phone visit to review those results. He would also like her to have a CT CAP at the end of October and MD f/u in office with him one week later. I explained this to the pt and she verbalized understanding.  Message sent to CT scheduling to assist in with CT head, then we will call pt to schedule MD TELEPHONE visit for that one, then MD in office visit one week post CT CAP.

## 2024-01-21 ENCOUNTER — Other Ambulatory Visit: Payer: Self-pay | Admitting: *Deleted

## 2024-01-21 ENCOUNTER — Telehealth: Payer: Self-pay | Admitting: *Deleted

## 2024-01-21 DIAGNOSIS — C50919 Malignant neoplasm of unspecified site of unspecified female breast: Secondary | ICD-10-CM

## 2024-01-21 DIAGNOSIS — C7951 Secondary malignant neoplasm of bone: Secondary | ICD-10-CM

## 2024-01-21 DIAGNOSIS — R42 Dizziness and giddiness: Secondary | ICD-10-CM

## 2024-01-21 DIAGNOSIS — C50511 Malignant neoplasm of lower-outer quadrant of right female breast: Secondary | ICD-10-CM

## 2024-01-21 LAB — CANCER ANTIGEN 27.29: CA 27.29: 396.9 U/mL — ABNORMAL HIGH (ref 0.0–38.6)

## 2024-01-21 NOTE — Telephone Encounter (Signed)
 Received call from pt requesting for CT CAP to be done now for disease evaluation.  RN reviewed with MD and verbal orders received and placed.  Pt notified to contact central scheduling.

## 2024-01-21 NOTE — Progress Notes (Signed)
 Received call from pt stating she does not wish to proceed with CT head at this time.  Pt educated that CT of the head is needed to evaluate for Vertigo.  Pt states she is not experiencing vertigo at this time and would like the order to be canceled.  RN reviewed with MD, verbal orders received and placed to discontinue CT head.  Pt notified to contact our office if symptoms come back.  Pt verbalized understanding.

## 2024-01-27 ENCOUNTER — Encounter: Payer: Self-pay | Admitting: Family Medicine

## 2024-01-31 ENCOUNTER — Encounter: Payer: Self-pay | Admitting: Hematology and Oncology

## 2024-01-31 ENCOUNTER — Other Ambulatory Visit (HOSPITAL_COMMUNITY): Payer: Self-pay

## 2024-02-01 ENCOUNTER — Other Ambulatory Visit (HOSPITAL_COMMUNITY): Payer: Self-pay

## 2024-02-01 ENCOUNTER — Telehealth: Payer: Self-pay | Admitting: Pharmacist

## 2024-02-01 NOTE — Telephone Encounter (Signed)
 [facility]       Telephone: [phone]?Fax: [phone]   Oncology Clinical Pharmacist Practitioner Progress Note  Jodi  Kaufman is a 83 y.o. female with a diagnosis of metastatic breast cancer currently on abemaciclib  + letrozole  + denosumab  120 mg under the care of Dr. [name].   I connected with [name] today by telephone and verified that I was speaking with the correct person using two patient identifiers. I discussed the limitations, risks, security and privacy concerns of performing an evaluation and management service by telemedicine and the availability of in-person appointments. The patient/caregiver expressed understanding and agreed to proceed.  Other persons participating in the visit and their role in the encounter: self   Patient's location: home  Provider's location: clinic  [name] had contacted Dr. Gara office inquiring if it would be okay to miss one dose of abemaciclib  since she will be at a family reunion. While not ideal, we mentioned this is reasonable and she could skip the evening dose that night and then restart with her morning dose the next morning. She uses a G-tube for administration and it would be difficult to administer if traveling.  She will have restaging scans on 02/10/24 and follow up visit with Dr. Chad on 02/17/24. We will had a lab appointment to that visit. Scheduling is aware.  [name] participated in the discussion, expressed understanding, and voiced agreement with the above plan. All questions were answered to her satisfaction. The patient was advised to contact the clinic at [phone] with any questions or concerns prior to her return visit.  Clinical pharmacy will continue to support [name] and Dr. [name] as needed.  Tanza Pellot A. Lucila, PharmD, BCOP, CPP  [name], RPH-CPP,  02/01/2024  3:24 PM   **Disclaimer: This note was dictated with voice recognition software.  Similar sounding words can inadvertently be transcribed and this note may contain transcription errors which may not have been corrected upon publication of note.**

## 2024-02-02 ENCOUNTER — Ambulatory Visit: Admitting: Hematology and Oncology

## 2024-02-02 ENCOUNTER — Inpatient Hospital Stay: Attending: Hematology and Oncology

## 2024-02-05 ENCOUNTER — Emergency Department (HOSPITAL_COMMUNITY)

## 2024-02-05 ENCOUNTER — Other Ambulatory Visit: Payer: Self-pay

## 2024-02-05 ENCOUNTER — Emergency Department (HOSPITAL_COMMUNITY): Admission: EM | Admit: 2024-02-05 | Discharge: 2024-02-05 | Disposition: A | Discharge status: Home / Self Care

## 2024-02-05 ENCOUNTER — Encounter (HOSPITAL_COMMUNITY): Payer: Self-pay | Admitting: Emergency Medicine

## 2024-02-05 DIAGNOSIS — K9423 Gastrostomy malfunction: Secondary | ICD-10-CM | POA: Diagnosis present

## 2024-02-05 DIAGNOSIS — Z853 Personal history of malignant neoplasm of breast: Secondary | ICD-10-CM | POA: Diagnosis not present

## 2024-02-05 MED ORDER — DIATRIZOATE MEGLUMINE & SODIUM 66-10 % PO SOLN
30.0000 mL | Freq: Once | ORAL | Status: AC
  Administered 2024-02-05: 30 mL

## 2024-02-05 MED ORDER — IOHEXOL 300 MG/ML  SOLN
30.0000 mL | Freq: Once | INTRAMUSCULAR | Status: AC | PRN
Start: 2024-02-05 — End: 2024-02-05
  Administered 2024-02-05: 30 mL

## 2024-02-05 MED ORDER — LIDOCAINE HCL URETHRAL/MUCOSAL 2 % EX GEL
1.0000 | Freq: Once | CUTANEOUS | Status: AC
  Administered 2024-02-05: 1 via TOPICAL

## 2024-02-05 NOTE — Discharge Instructions (Signed)
 Your PEG tube was replaced here and is ready to use.  Please follow-up with your doctor and return to the ER for worsening symptoms.

## 2024-02-05 NOTE — ED Triage Notes (Signed)
 Pt reports her G-tube came out 2 hours ago. Denies pain.

## 2024-02-05 NOTE — ED Provider Notes (Signed)
 [place] EMERGENCY DEPARTMENT AT [facility] Provider Note   CSN: [id number] Arrival date & time: 02/05/24  1044     Patient presents with: G-tube problem   Jodi Kaufman is a 83 y.o. female.   83 year old female with past medical history of breast cancer who is G-tube dependent presenting to the emergency department today with concern for her G-tube becoming dislodged.  The patient receives tube feeds and medications through this.  She was brought to the ER at that time further evaluation.  This came out really she noticed this 2-1/2 hours prior to arrival.        Prior to Admission medications   Medication Sig Start Date End Date Taking? Authorizing Provider  abemaciclib  (VERZENIO ) 100 MG tablet Take 1 tablet (100 mg total) by mouth 2 (two) times daily. 11/22/23   Gudena, Vinay, MD  acetaminophen  (TYLENOL ) 160 MG/5ML liquid Take 500 mg by mouth every 4 (four) hours as needed for fever.    [provider]  albuterol  (VENTOLIN  HFA) 108 (90 Base) MCG/ACT inhaler Inhale 2 puffs into the lungs every 6 (six) hours as needed for wheezing. 02/22/23   Bevin Bernice RAMAN, DO  ciprofloxacin  (CILOXAN ) 0.3 % ophthalmic solution Place 1 drop into the left eye every 2 (two) hours. Administer 1 drop, every 2 hours, while awake, for 2 days. Then 1 drop, every 4 hours, while awake, for the next 5 days. 12/21/23   Gudena, Vinay, MD  letrozole  (FEMARA ) 2.5 MG tablet Take 1 tablet (2.5 mg total) by mouth daily. 11/22/23   Gudena, Vinay, MD  LORazepam  (ATIVAN ) 0.5 MG tablet Take 1 tablet 1 hour before scan- may repeat at time of scan if needed. 10/25/23   Gudena, Vinay, MD  Spacer/Aero-Holding Chambers (AEROCHAMBER PLUS WITH MASK) inhaler Use with inhaler 02/22/23   Wachs, Erika S, DO  Vitamin D -Vitamin K (VITAMIN K2 -VITAMIN D3) 90-125 MCG CAPS Take 1 capsule by mouth daily at 12 noon. 12/21/23   Gudena, Vinay, MD    Allergies: Atorvastatin, Bactrim  [sulfamethoxazole -trimethoprim ],  Clavulanic acid, Guaifenesin & derivatives, and Tamoxifen    Review of Systems  All other systems reviewed and are negative.   Updated Vital Signs BP 121/78 (BP Location: Left Arm)   Pulse 89   SpO2 100%   Physical Exam Vitals and nursing note reviewed.  Constitutional:      Appearance: Normal appearance.  Abdominal:     Comments: Nontender, stoma noted in LUQ with minimal bleeding  Neurological:     Mental Status: She is alert.     (all labs ordered are listed, but only abnormal results are displayed) Labs Reviewed - No data to display  EKG: None  Radiology: DG ABDOMEN PEG TUBE LOCATION Result Date: 02/05/2024 CLINICAL DATA:  Percutaneous endoscopic gastrostomy tube positioning. EXAM: ABDOMEN - 1 VIEW COMPARISON:  Sep 02, 2023 FINDINGS: The bowel gas pattern is normal. A PEG tube is in place with its distal tip overlying the body of the stomach. Radiopaque contrast is seen within the lumen of the PEG tube and throughout the stomach and multiple small bowel loops. No radio-opaque calculi or other significant radiographic abnormality are seen. IMPRESSION: PEG tube positioning, as described above. Electronically Signed   By: Suzen Dials M.D.   On: 02/05/2024 12:13     .Gastrostomy tube replacement  Date/Time: 02/05/2024 11:35 AM  Performed by: Ula Prentice SAUNDERS, MD Authorized by: Ula Prentice SAUNDERS, MD  Consent: Verbal consent obtained Consent given by: patient Imaging studies: imaging studies  available Patient identity confirmed: verbally with patient Time out: Immediately prior to procedure a time out was called to verify the correct patient, procedure, equipment, support staff and site/side marked as required. Preparation: Patient was prepped and draped in the usual sterile fashion. Local anesthesia used: no  Anesthesia: Local anesthesia used: no  Sedation: Patient sedated: no  Patient tolerance: patient tolerated the procedure well with no immediate  complications Comments: 79F placed after unsuccessful with 28 and 11F insertion due to stoma closing      Medications Ordered in the ED  lidocaine  (XYLOCAINE ) 2 % jelly 1 Application (has no administration in time range)  diatrizoate meglumine-sodium (GASTROGRAFIN) 66-10 % solution 30 mL (has no administration in time range)  iohexol  (OMNIPAQUE ) 300 MG/ML solution 30 mL (30 mLs Per Tube Contrast Given 02/05/24 1201)                                    Medical Decision Making 83 year old female past medical history of breast cancer presenting to the emergency department today with her G-tube falling out.  This was replaced but did have to be downsized due to her stoma having closed some since her tube came out.  Will obtain a tube study to confirm placement.  Plans for discharge if this is unremarkable.  Tube study shows that PEG tube is in the correct position.  The patient is discharged with return precautions.  Amount and/or Complexity of Data Reviewed Radiology: ordered.  Risk Prescription drug management.        Final diagnoses:  PEG tube malfunction [facility])    ED Discharge Orders     None          Ula Prentice SAUNDERS, MD 02/05/24 1218

## 2024-02-10 ENCOUNTER — Ambulatory Visit (HOSPITAL_COMMUNITY)
Admission: RE | Admit: 2024-02-10 | Discharge: 2024-02-10 | Disposition: A | Discharge status: Home / Self Care | Source: Ambulatory Visit | Attending: Hematology and Oncology | Admitting: Hematology and Oncology

## 2024-02-10 DIAGNOSIS — Z170 Estrogen receptor positive status [ER+]: Secondary | ICD-10-CM | POA: Insufficient documentation

## 2024-02-10 DIAGNOSIS — C50919 Malignant neoplasm of unspecified site of unspecified female breast: Secondary | ICD-10-CM | POA: Diagnosis present

## 2024-02-10 DIAGNOSIS — C50511 Malignant neoplasm of lower-outer quadrant of right female breast: Secondary | ICD-10-CM | POA: Insufficient documentation

## 2024-02-10 MED ORDER — IOHEXOL 300 MG/ML  SOLN
80.0000 mL | Freq: Once | INTRAMUSCULAR | Status: AC | PRN
Start: 2024-02-10 — End: 2024-02-10
  Administered 2024-02-10: 80 mL via INTRAVENOUS

## 2024-02-17 ENCOUNTER — Inpatient Hospital Stay

## 2024-02-17 ENCOUNTER — Inpatient Hospital Stay: Attending: Hematology and Oncology | Admitting: Hematology and Oncology

## 2024-02-17 VITALS — BP 100/52 | HR 89 | Temp 98.0°F | Resp 16 | Ht 66.0 in | Wt 135.4 lb

## 2024-02-17 DIAGNOSIS — C50919 Malignant neoplasm of unspecified site of unspecified female breast: Secondary | ICD-10-CM | POA: Diagnosis not present

## 2024-02-17 DIAGNOSIS — Z1722 Progesterone receptor negative status: Secondary | ICD-10-CM | POA: Diagnosis not present

## 2024-02-17 DIAGNOSIS — C7951 Secondary malignant neoplasm of bone: Secondary | ICD-10-CM | POA: Insufficient documentation

## 2024-02-17 DIAGNOSIS — C50911 Malignant neoplasm of unspecified site of right female breast: Secondary | ICD-10-CM | POA: Diagnosis present

## 2024-02-17 DIAGNOSIS — Z1732 Human epidermal growth factor receptor 2 negative status: Secondary | ICD-10-CM | POA: Insufficient documentation

## 2024-02-17 DIAGNOSIS — Z923 Personal history of irradiation: Secondary | ICD-10-CM | POA: Diagnosis not present

## 2024-02-17 DIAGNOSIS — Z79811 Long term (current) use of aromatase inhibitors: Secondary | ICD-10-CM | POA: Insufficient documentation

## 2024-02-17 DIAGNOSIS — C7931 Secondary malignant neoplasm of brain: Secondary | ICD-10-CM | POA: Insufficient documentation

## 2024-02-17 DIAGNOSIS — Z170 Estrogen receptor positive status [ER+]: Secondary | ICD-10-CM | POA: Insufficient documentation

## 2024-02-17 LAB — CBC WITH DIFFERENTIAL (CANCER CENTER ONLY)
Abs Immature Granulocytes: 0.01 K/uL (ref 0.00–0.07)
Basophils Absolute: 0.1 K/uL (ref 0.0–0.1)
Basophils Relative: 2 %
Eosinophils Absolute: 0.2 K/uL (ref 0.0–0.5)
Eosinophils Relative: 4 %
HCT: 42.5 % (ref 36.0–46.0)
Hemoglobin: 14.5 g/dL (ref 12.0–15.0)
Immature Granulocytes: 0 %
Lymphocytes Relative: 13 %
Lymphs Abs: 0.6 K/uL — ABNORMAL LOW (ref 0.7–4.0)
MCH: 33.4 pg (ref 26.0–34.0)
MCHC: 34.1 g/dL (ref 30.0–36.0)
MCV: 97.9 fL (ref 80.0–100.0)
Monocytes Absolute: 0.4 K/uL (ref 0.1–1.0)
Monocytes Relative: 10 %
Neutro Abs: 3.1 K/uL (ref 1.7–7.7)
Neutrophils Relative %: 71 %
Platelet Count: 166 K/uL (ref 150–400)
RBC: 4.34 MIL/uL (ref 3.87–5.11)
RDW: 15.5 % (ref 11.5–15.5)
WBC Count: 4.3 K/uL (ref 4.0–10.5)
nRBC: 0 % (ref 0.0–0.2)

## 2024-02-17 LAB — CMP (CANCER CENTER ONLY)
ALT: 16 U/L (ref 0–44)
AST: 24 U/L (ref 15–41)
Albumin: 4.2 g/dL (ref 3.5–5.0)
Alkaline Phosphatase: 53 U/L (ref 38–126)
Anion gap: 7 (ref 5–15)
BUN: 32 mg/dL — ABNORMAL HIGH (ref 8–23)
CO2: 30 mmol/L (ref 22–32)
Calcium: 9.8 mg/dL (ref 8.9–10.3)
Chloride: 103 mmol/L (ref 98–111)
Creatinine: 1.16 mg/dL — ABNORMAL HIGH (ref 0.44–1.00)
GFR, Estimated: 47 mL/min — ABNORMAL LOW (ref >60)
Glucose, Bld: 104 mg/dL — ABNORMAL HIGH (ref 70–99)
Potassium: 5 mmol/L (ref 3.5–5.1)
Sodium: 140 mmol/L (ref 135–145)
Total Bilirubin: 0.8 mg/dL (ref 0.0–1.2)
Total Protein: 7.2 g/dL (ref 6.5–8.1)

## 2024-02-17 NOTE — Assessment & Plan Note (Signed)
 Originally diagnosed in 1995, patient did not take prescribed tamoxifen. Now presenting with metastatic disease based on scans done 03/11/2020 CT CAP: Numerous lytic lesions in the thoracic spine several with expansion extraosseous extension. 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  Uncertain if any radiation was given to the skull lesions.   Current Treatment:   letrozole . Started December 2021, discontinued for progression and switched to Faslodex  10/15/2022   Poor nutritional status: Since the tube feedings have started her weight is improving.  Energy levels are improving and she is got a better positive outlook.    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  No evidence of visceral metastatic disease. CT CAP 12/30/2022: No significant interval change in the bone metastases.  Stable bulky subpectoral lymph nodes. CT CAP 04/29/2023: No significant interval change in the bone metastasis, no change in the bulky right subpectoral lymph nodes, new hypodense foci in the cecum (?  Ingested material or tablets) Guardant360 05/11/2023: PIK3CA mutation, RAF 1 mutation, TMB 21.35, MSI not detected I had previously discussed with her about different options for treatment including Verzinio, Capivasertib or Keytruda immunotherapy.  CT CAP 08/18/2023: Slight progression of subpectoral lymph node from 6.1 cm to 6.4 cm, stable bone metastases   Current treatment: Verzinio with letrozole  started 12/14/2023 Toxicities: Constipation: She is using MiraLAX  Eye discharge: I sent a prescription for ciprofloxacin  eyedrops Fatigue  CT CAP 02/14/2024: Interval decrease in the right subpectoral lymph node from 5.3 cm to 3.6 cm, stable bone metastases   Dizziness and vertigo:

## 2024-02-17 NOTE — Progress Notes (Signed)
 Patient Care Team: Jodi Dorothyann BIRCH, MD as PCP - General (Family Medicine) Jodi Potts, MD as Consulting Physician (Hematology and Oncology)  DIAGNOSIS:  Encounter Diagnosis  Name Primary?   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) Yes    SUMMARY OF ONCOLOGIC HISTORY: Oncology History  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)  1995 Initial Biopsy   Right breast cancer stage IIb ER positive IDC s/p mastectomy and 2 months of tamoxifen, lost to follow-up (treatment at [facility] )   03/12/2020 - 03/26/2020 Radiation Therapy   Palliative radiation to the brain and spine for brain metastases   03/14/2020 Initial Diagnosis   Scans on 03/14/2020: Multiple bone metastases and brain metastases, biopsy of the rib: Breast cancer ER 80% PR negative HER-2 negative.  Moved to [facility] December 2021 to be closer to her family   02/2020 -  Anti-estrogen oral therapy   Anastrozole  daily   10/21/2020 Treatment Plan Change   Xgeva  every 3 months   10/06/2022 Imaging   IMPRESSION: 1. Slight interval enlargement of bulky right subpectoral lymph nodes, consistent with worsened nodal metastatic disease. 2. Multiple unchanged mixed lytic and sclerotic osseous metastatic lesions, notable for a large, expansile lesion of the right second rib as well as numerous additional expansile rib lesions, thoracolumbar vertebral body lesions, and pelvic lesions. 3. No other evidence of metastatic disease in the chest, abdomen, or pelvis. 4. Coronary artery disease. 5. Aortic valve calcifications. Correlate for echocardiographic evidence of aortic valve dysfunction.   Aortic Atherosclerosis (ICD10-I70.0).     Electronically Signed   By: Jodi Kaufman M.D.   On: 10/08/2022 07:16   10/15/2022 Cancer Staging   Staging form: Breast, AJCC 8th Edition - Clinical: Stage IV (pM1) - Signed by Jodi Morna Pickle, NP on 10/15/2022     CHIEF COMPLIANT:  Follow-up to discuss results of recent CT scans for metastatic breast cancer  HISTORY OF PRESENT ILLNESS:  History of Present Illness Jodi Kaufman is an 83 year old female with metastatic cancer who presents for follow-up on her treatment response and blood work.  Recent imaging shows a decrease in the size of the right subpectoral lymph node from 5.3 cm to 3.6 cm, while bone metastases remain unchanged. She experiences back pain, which she attributes to bone metastases or arthritis.  She is currently taking letrozole  and verzenio . She generally feels well after taking verzenio  and does not experience significant side effects such as diarrhea, although she occasionally has loose stools. Blood work shows an improvement in blood counts, with lymphocytes remaining low.      ALLERGIES:  is allergic to atorvastatin, bactrim  [sulfamethoxazole -trimethoprim ], clavulanic acid, guaifenesin & derivatives, and tamoxifen.  MEDICATIONS:  Current Outpatient Medications  Medication Sig Dispense Refill   abemaciclib  (VERZENIO ) 100 MG tablet Take 1 tablet (100 mg total) by mouth 2 (two) times daily. 60 tablet 3   acetaminophen  (TYLENOL ) 160 MG/5ML liquid Take 500 mg by mouth every 4 (four) hours as needed for fever.     albuterol  (VENTOLIN  HFA) 108 (90 Base) MCG/ACT inhaler Inhale 2 puffs into the lungs every 6 (six) hours as needed for wheezing. 8.5 g 11   ciprofloxacin  (CILOXAN ) 0.3 % ophthalmic solution Place 1 drop into the left eye every 2 (two) hours. Administer 1 drop, every 2 hours, while awake, for 2 days. Then 1 drop, every 4 hours, while awake, for the next 5 days. 10 mL 0   letrozole  (FEMARA ) 2.5 MG tablet Take 1 tablet (2.5  mg total) by mouth daily. 90 tablet 3   LORazepam  (ATIVAN ) 0.5 MG tablet Take 1 tablet 1 hour before scan- may repeat at time of scan if needed. 10 tablet 0   Spacer/Aero-Holding Chambers (AEROCHAMBER PLUS WITH MASK) inhaler Use with inhaler 1 each 3   Vitamin D -Vitamin K  (VITAMIN K2 -VITAMIN D3) 90-125 MCG CAPS Take 1 capsule by mouth daily at 12 noon.     No current facility-administered medications for this visit.    PHYSICAL EXAMINATION: ECOG PERFORMANCE STATUS: 1 - Symptomatic but completely ambulatory  Vitals:   02/17/24 1316  BP: (!) 100/52  Pulse: 89  Resp: 16  Temp: 98 F (36.7 C)  SpO2: 100%   Filed Weights   02/17/24 1316  Weight: 135 lb 6.4 oz (61.4 kg)    Physical Exam   (exam performed in the presence of a chaperone)  LABORATORY DATA:  I have reviewed the data as listed    Latest Ref Rng & Units 01/20/2024    1:10 PM 01/06/2024    1:42 PM 12/21/2023   10:25 AM  CMP  Glucose 70 - 99 mg/dL 893  98  897   BUN 8 - 23 mg/dL 32  35  32   Creatinine 0.44 - 1.00 mg/dL 8.86  8.82  8.82   Sodium 135 - 145 mmol/L 139  139  138   Potassium 3.5 - 5.1 mmol/L 5.1  5.0  4.7   Chloride 98 - 111 mmol/L 104  104  103   CO2 22 - 32 mmol/L 32  31  30   Calcium 8.9 - 10.3 mg/dL 9.6  9.7  9.5   Total Protein 6.5 - 8.1 g/dL 7.0  7.1  6.7   Total Bilirubin 0.0 - 1.2 mg/dL 0.6  0.7  0.8   Alkaline Phos 38 - 126 U/L 61  51  52   AST 15 - 41 U/L 29  26  27    ALT 0 - 44 U/L 26  19  23      Lab Results  Component Value Date   WBC 4.3 02/17/2024   HGB 14.5 02/17/2024   HCT 42.5 02/17/2024   MCV 97.9 02/17/2024   PLT 166 02/17/2024   NEUTROABS 3.1 02/17/2024    ASSESSMENT & PLAN:  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 Now presenting with metastatic disease based on scans done 03/11/2020 CT CAP: Numerous lytic lesions in the thoracic spine several with expansion extraosseous extension. 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  Uncertain if any radiation was given to the skull  lesions.   Current Treatment:   letrozole . Started December 2021, discontinued for progression and switched to Faslodex  10/15/2022   Poor nutritional status: Since the tube feedings have started her weight is improving.  Energy levels are improving and she is got a better positive outlook.    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  No evidence of visceral metastatic disease. CT CAP 12/30/2022: No significant interval change in the bone metastases.  Stable bulky subpectoral lymph nodes. CT CAP 04/29/2023: No significant interval change in the bone metastasis, no change in the bulky right subpectoral lymph nodes, new hypodense foci in the cecum (?  Ingested material or tablets) Guardant360 05/11/2023: PIK3CA mutation, RAF  1 mutation, TMB 21.35, MSI not detected I had previously discussed with her about different options for treatment including Verzinio, Capivasertib or Keytruda immunotherapy.  CT CAP 08/18/2023: Slight progression of subpectoral lymph node from 6.1 cm to 6.4 cm, stable bone metastases   Current treatment: Verzinio with letrozole  started 12/14/2023 Toxicities: Constipation alternating with loose stools  Fatigue  CT CAP 02/14/2024: Interval decrease in the right subpectoral lymph node from 5.3 cm to 3.6 cm, stable bone metastases Since you are responding very well to treatment we will continue with the Verzinio and letrozole .  Follow-up in 2 months with lab and Xgeva  injection. Scans will be in 3 months. Assessment & Plan Metastatic breast cancer Decrease in right subpectoral lymph node size indicates positive treatment response. Bone metastases stable. Blood counts improved; lymphocytes slightly low as expected. No new or worsening medication side effects. Liver function tests pending. - Continue abemaciclib  and letrozole . - Schedule CT scan in three months, then every six months if stable. - Perform blood work every three months. -  Follow-up in two months for injection and labs.      Orders Placed This Encounter  Procedures   CBC with Differential (Cancer Center Only)    Standing Status:   Future    Expiration Date:   02/16/2025   CMP (Cancer Center only)    Standing Status:   Future    Expiration Date:   02/16/2025   CA 27.29    Standing Status:   Future    Expiration Date:   02/16/2025   The patient has a good understanding of the overall plan. she agrees with it. she will call with any problems that may develop before the next visit here.  I personally spent a total of 30 minutes in the care of the patient today including preparing to see the patient, getting/reviewing separately obtained history, performing a medically appropriate exam/evaluation, counseling and educating, placing orders, referring and communicating with other health care professionals, documenting clinical information in the EHR, independently interpreting results, communicating results, and coordinating care.   Viinay K Mahi Zabriskie, MD 02/17/24

## 2024-02-23 ENCOUNTER — Inpatient Hospital Stay (HOSPITAL_COMMUNITY): Admission: RE | Admit: 2024-02-23 | Source: Ambulatory Visit

## 2024-02-24 ENCOUNTER — Inpatient Hospital Stay: Admitting: Hematology and Oncology

## 2024-02-28 ENCOUNTER — Other Ambulatory Visit (HOSPITAL_COMMUNITY): Payer: Self-pay

## 2024-02-29 ENCOUNTER — Other Ambulatory Visit (HOSPITAL_COMMUNITY): Payer: Self-pay

## 2024-02-29 ENCOUNTER — Other Ambulatory Visit: Payer: Self-pay

## 2024-03-07 ENCOUNTER — Other Ambulatory Visit (HOSPITAL_COMMUNITY)

## 2024-03-08 ENCOUNTER — Other Ambulatory Visit: Payer: Self-pay | Admitting: Hematology and Oncology

## 2024-03-12 ENCOUNTER — Other Ambulatory Visit: Payer: Self-pay

## 2024-03-12 ENCOUNTER — Emergency Department (HOSPITAL_COMMUNITY)
Admission: EM | Admit: 2024-03-12 | Discharge: 2024-03-12 | Disposition: A | Discharge status: Home / Self Care | Attending: Emergency Medicine | Admitting: Emergency Medicine

## 2024-03-12 ENCOUNTER — Emergency Department (HOSPITAL_COMMUNITY)

## 2024-03-12 DIAGNOSIS — K9423 Gastrostomy malfunction: Secondary | ICD-10-CM | POA: Insufficient documentation

## 2024-03-12 DIAGNOSIS — Z853 Personal history of malignant neoplasm of breast: Secondary | ICD-10-CM | POA: Insufficient documentation

## 2024-03-12 MED ORDER — IOHEXOL 300 MG/ML  SOLN
50.0000 mL | Freq: Once | INTRAMUSCULAR | Status: AC | PRN
  Administered 2024-03-12: 50 mL

## 2024-03-12 NOTE — ED Triage Notes (Signed)
 Patient to ED by POV with c/o Peg-Tube popping out this AM. States she has had peg-tube for 4 years the newer one she has had for 3 months and this is the second time this has come out.

## 2024-03-12 NOTE — ED Provider Notes (Signed)
 [place] EMERGENCY DEPARTMENT AT [facility] Provider Note   CSN: [id number] Arrival date & time: 03/12/24  1009     Patient presents with: Pegtube issue   Jodi  Kaufman is a 83 y.o. female patient with past medical history of breast cancer with metastasis, peg tube due to poor feeding, GERD, frequent UTI, overactive bladder presenting to emergency room with complaint of peg tube displacement.  Patient reports this has been out for approximately 3 hours prior to arrival to ED.  Patient thinks that the PEG tube was wrapped up in her shirt when she was sleeping and yanked out.  It was pulled out with the balloon still inflated, has some surrounding bleeding at the site.   HPI     Prior to Admission medications   Medication Sig Start Date End Date Taking? Authorizing Provider  acetaminophen  (TYLENOL ) 160 MG/5ML liquid Take 500 mg by mouth every 4 (four) hours as needed for fever.    [provider]  albuterol  (VENTOLIN  HFA) 108 (90 Base) MCG/ACT inhaler Inhale 2 puffs into the lungs every 6 (six) hours as needed for wheezing. 02/22/23   Bevin Bernice RAMAN, DO  ciprofloxacin  (CILOXAN ) 0.3 % ophthalmic solution Place 1 drop into the left eye every 2 (two) hours. Administer 1 drop, every 2 hours, while awake, for 2 days. Then 1 drop, every 4 hours, while awake, for the next 5 days. 12/21/23   Gudena, Vinay, MD  letrozole  (FEMARA ) 2.5 MG tablet Take 1 tablet (2.5 mg total) by mouth daily. 11/22/23   Gudena, Vinay, MD  LORazepam  (ATIVAN ) 0.5 MG tablet Take 1 tablet 1 hour before scan- may repeat at time of scan if needed. 10/25/23   Gudena, Vinay, MD  Spacer/Aero-Holding Chambers (AEROCHAMBER PLUS WITH MASK) inhaler Use with inhaler 02/22/23   Wachs, Erika S, DO  VERZENIO  100 MG tablet TAKE 1 TABLET BY MOUTH TWICE DAILY 03/08/24   Gudena, Vinay, MD  Vitamin D -Vitamin K (VITAMIN K2 -VITAMIN D3) 90-125 MCG CAPS Take 1 capsule by mouth daily at 12 noon. 12/21/23   Gudena, Vinay, MD     Allergies: Atorvastatin, Bactrim  [sulfamethoxazole -trimethoprim ], Clavulanic acid, Guaifenesin & derivatives, and Tamoxifen    Review of Systems  Gastrointestinal:  Positive for abdominal pain.    Updated Vital Signs BP 134/73   Pulse 84   Temp 98.1 F (36.7 C) (Oral)   Resp 16   Ht 5' 6.5 (1.689 m)   Wt 61.2 kg   SpO2 99%   BMI 21.46 kg/m   Physical Exam Vitals and nursing note reviewed.  Constitutional:      General: She is not in acute distress.    Appearance: She is not toxic-appearing.  HENT:     Head: Normocephalic and atraumatic.  Eyes:     General: No scleral icterus.    Conjunctiva/sclera: Conjunctivae normal.  Cardiovascular:     Rate and Rhythm: Normal rate and regular rhythm.     Pulses: Normal pulses.     Heart sounds: Normal heart sounds.  Pulmonary:     Effort: Pulmonary effort is normal. No respiratory distress.     Breath sounds: Normal breath sounds.  Abdominal:     General: Abdomen is flat. Bowel sounds are normal.     Palpations: Abdomen is soft.     Tenderness: There is no abdominal tenderness.     Comments: Tract for PEG tube in left upper abd, some mild surrounding redness and scant bleeding  Skin:    General: Skin is  warm and dry.     Findings: No lesion.  Neurological:     General: No focal deficit present.     Mental Status: She is alert and oriented to person, place, and time. Mental status is at baseline.     (all labs ordered are listed, but only abnormal results are displayed) Labs Reviewed - No data to display  EKG: None  Radiology: DG Abdomen PEG Tube Location Result Date: 03/12/2024 EXAM: 1 VIEW XRAY OF THE ABDOMEN 03/12/2024 12:09:00 PM COMPARISON: None available. CLINICAL HISTORY: [id number] Gastrojejunostomy tube status (HCC) 401611 Gastrojejunostomy tube status (HCC) FINDINGS: BOWEL: Enteric contrast instilled via gastrostomy demonstrates localization of the contrast to the gas of the stomach and emptying into small  bowel. Nonobstructive bowel gas pattern. SOFT TISSUES: No opaque urinary calculi. BONES: No acute osseous abnormality. IMPRESSION: 1. Gastrojejunostomy tube in appropriate position with enteric contrast demonstrating appropriate localization and emptying. Electronically signed by: Fonda Field MD 03/12/2024 12:23 PM EST RP Workstation: [id number]     .Gastrostomy tube replacement  Date/Time: 03/12/2024 12:28 PM  Performed by: Shermon Warren SAILOR, PA-C Authorized by: Shermon Warren SAILOR, PA-C  Consent: Verbal consent obtained Consent given by: patient Patient understanding: patient states understanding of the procedure being performed Patient consent: the patient's understanding of the procedure matches consent given Procedure consent: procedure consent matches procedure scheduled Relevant documents: relevant documents present and verified Test results: test results available and properly labeled Site marked: the operative site was marked Imaging studies: imaging studies available Patient identity confirmed: verbally with patient Time out: Immediately prior to procedure a time out was called to verify the correct patient, procedure, equipment, support staff and site/side marked as required. Preparation: Patient was prepped and draped in the usual sterile fashion. Local anesthesia used: no  Anesthesia: Local anesthesia used: no  Sedation: Patient sedated: no  Patient tolerance: patient tolerated the procedure well with no immediate complications      Medications Ordered in the ED  iohexol  (OMNIPAQUE ) 300 MG/ML solution 50 mL (50 mLs Per Tube Contrast Given 03/12/24 1210)                                    Medical Decision Making Amount and/or Complexity of Data Reviewed Radiology: ordered.  Risk Prescription drug management.   This patient presents to the ED for concern of PEG tube problem, this involves an extensive number of treatment options, and is a complaint that  carries with it a high risk of complications and morbidity.  The differential diagnosis includes G-tube malfunction, G-tube displacement, infection   Imaging Studies ordered:  I ordered imaging studies including Abd xray  I independently visualized and interpreted imaging which showed adequate placement I agree with the radiologist interpretation   Cardiac Monitoring: / EKG:  The patient was maintained on a cardiac monitor.     Problem List / ED Course / Critical interventions / Medication management  Presenting to emergency room with complaint of G-tube displacement.  This started approximately 3 hours prior to arrival.  She is unsure exactly what happened but the G-tube was ripped out while fully inflated.  She does have some chronic soft tissue changes surrounding this area and some mild bleeding from the trauma.  She is otherwise hemodynamically stable and well-appearing.  I was able to very easily replace G-tube and it was replaced with a 20 French.  Patient tolerated the procedure without complication and x-ray  here does confirm adequate placement. Hemodynamically stable and well-appearing appropriate discharge with close outpatient follow-up.        Final diagnoses:  PEG tube malfunction [facility])    ED Discharge Orders     None          Shermon Warren SAILOR, PA-C 03/12/24 1239    Suzette Pac, MD 03/14/24 0930

## 2024-03-12 NOTE — Discharge Instructions (Addendum)
 It was a pleasure taking care of you today.  PEG tube is confirmed to be in the correct placement.  Please return to emergency room with new or worsening symptoms.

## 2024-03-20 ENCOUNTER — Other Ambulatory Visit: Payer: Self-pay

## 2024-03-26 ENCOUNTER — Emergency Department (HOSPITAL_COMMUNITY)
Admission: EM | Admit: 2024-03-26 | Discharge: 2024-03-26 | Disposition: A | Discharge status: Home / Self Care | Attending: Emergency Medicine | Admitting: Emergency Medicine

## 2024-03-26 ENCOUNTER — Emergency Department (HOSPITAL_COMMUNITY)

## 2024-03-26 ENCOUNTER — Encounter (HOSPITAL_COMMUNITY): Payer: Self-pay | Admitting: *Deleted

## 2024-03-26 ENCOUNTER — Other Ambulatory Visit: Payer: Self-pay

## 2024-03-26 DIAGNOSIS — R109 Unspecified abdominal pain: Secondary | ICD-10-CM | POA: Insufficient documentation

## 2024-03-26 DIAGNOSIS — K9420 Gastrostomy complication, unspecified: Secondary | ICD-10-CM | POA: Insufficient documentation

## 2024-03-26 DIAGNOSIS — Z85830 Personal history of malignant neoplasm of bone: Secondary | ICD-10-CM | POA: Diagnosis not present

## 2024-03-26 DIAGNOSIS — Z853 Personal history of malignant neoplasm of breast: Secondary | ICD-10-CM | POA: Insufficient documentation

## 2024-03-26 LAB — CBC WITH DIFFERENTIAL/PLATELET
Abs Immature Granulocytes: 0.01 K/uL (ref 0.00–0.07)
Basophils Absolute: 0.1 K/uL (ref 0.0–0.1)
Basophils Relative: 1 %
Eosinophils Absolute: 0.2 K/uL (ref 0.0–0.5)
Eosinophils Relative: 4 %
HCT: 38.5 % (ref 36.0–46.0)
Hemoglobin: 12.9 g/dL (ref 12.0–15.0)
Immature Granulocytes: 0 %
Lymphocytes Relative: 13 %
Lymphs Abs: 0.6 K/uL — ABNORMAL LOW (ref 0.7–4.0)
MCH: 34.8 pg — ABNORMAL HIGH (ref 26.0–34.0)
MCHC: 33.5 g/dL (ref 30.0–36.0)
MCV: 103.8 fL — ABNORMAL HIGH (ref 80.0–100.0)
Monocytes Absolute: 0.5 K/uL (ref 0.1–1.0)
Monocytes Relative: 11 %
Neutro Abs: 3.4 K/uL (ref 1.7–7.7)
Neutrophils Relative %: 71 %
Platelets: 176 K/uL (ref 150–400)
RBC: 3.71 MIL/uL — ABNORMAL LOW (ref 3.87–5.11)
RDW: 14.2 % (ref 11.5–15.5)
WBC: 4.7 K/uL (ref 4.0–10.5)
nRBC: 0 % (ref 0.0–0.2)

## 2024-03-26 LAB — COMPREHENSIVE METABOLIC PANEL WITH GFR
ALT: 16 U/L (ref 0–44)
AST: 25 U/L (ref 15–41)
Albumin: 3.8 g/dL (ref 3.5–5.0)
Alkaline Phosphatase: 54 U/L (ref 38–126)
Anion gap: 9 (ref 5–15)
BUN: 29 mg/dL — ABNORMAL HIGH (ref 8–23)
CO2: 27 mmol/L (ref 22–32)
Calcium: 9.4 mg/dL (ref 8.9–10.3)
Chloride: 102 mmol/L (ref 98–111)
Creatinine, Ser: 1.01 mg/dL — ABNORMAL HIGH (ref 0.44–1.00)
GFR, Estimated: 55 mL/min — ABNORMAL LOW (ref >60)
Glucose, Bld: 104 mg/dL — ABNORMAL HIGH (ref 70–99)
Potassium: 4.3 mmol/L (ref 3.5–5.1)
Sodium: 137 mmol/L (ref 135–145)
Total Bilirubin: 0.7 mg/dL (ref 0.0–1.2)
Total Protein: 6.6 g/dL (ref 6.5–8.1)

## 2024-03-26 MED ORDER — DIATRIZOATE MEGLUMINE & SODIUM 66-10 % PO SOLN: 50.0000 mL | Freq: Once | ORAL | Status: DC

## 2024-03-26 MED ORDER — IOHEXOL 300 MG/ML  SOLN
25.0000 mL | Freq: Once | INTRAMUSCULAR | Status: AC | PRN
  Administered 2024-03-26: 25 mL

## 2024-03-26 MED ORDER — CIPRO 500 MG/5ML (10%) PO SUSR: 500.0000 mg | Freq: Two times a day (BID) | ORAL | 0 refills | Status: AC

## 2024-03-26 MED ORDER — LORAZEPAM 2 MG/ML IJ SOLN
0.2500 mg | Freq: Once | INTRAMUSCULAR | Status: AC
  Administered 2024-03-26: 0.25 mg via INTRAVENOUS
  Filled 2024-03-26: qty 1

## 2024-03-26 MED ORDER — IOHEXOL 300 MG/ML  SOLN
80.0000 mL | Freq: Once | INTRAMUSCULAR | Status: AC | PRN
  Administered 2024-03-26: 80 mL via INTRAVENOUS

## 2024-03-26 NOTE — ED Notes (Addendum)
 Patient transported to CT

## 2024-03-26 NOTE — ED Provider Notes (Signed)
 [place] EMERGENCY DEPARTMENT AT [facility] Provider Note  CSN: [id number] Arrival date & time: 03/26/24 0118  Chief Complaint(s) Abdominal Pain and Peg Tube dislodged  HPI Jodi  Kaufman is a 83 y.o. female with a past medical history listed below including dysphagia requiring PEG tube placement here for PEG tube dislodgment this evening.  Patient has recurrent dislodgment send PEG tube replaced 2 weeks ago here in the emergency department.  Reports that the tube has been leaking and she has been tightening down stopper.  Patient has also developed worsening hyperemia and swelling around the insertion site with discomfort to palpation.  Denies any other physical complaints.  The history is provided by the patient.    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) 11/26/2022   Neuropathic pain 11/26/2022   Palliative care by specialist 11/26/2022   History of colonic polyps 11/26/2022   Cough productive of purulent sputum 09/08/2021   Uses feeding tube 09/08/2021   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(HCC) 08/23/2020   Encounter for PEG (percutaneous endoscopic gastrostomy) (HCC)    Protein-calorie malnutrition, severe 04/11/2020   Failure to thrive in adult 04/10/2020   Dehydration    Abnormal liver enzymes    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) 04/01/2020   RUQ pain 11/09/2019   Abnormal weight loss 11/09/2019   Family history of colon cancer 08/16/2019   Mild emphysema (HCC) 08/16/2019   Discrete lymph node 08/16/2019   Rheumatoid factor positive 07/06/2019    Recurrent UTI 05/17/2019   Hyperlipidemia 05/15/2018   GAD (generalized anxiety disorder) 02/09/2018   History of osteopenia 11/27/2015   GERD (gastroesophageal reflux disease) 11/27/2015   Vitamin D  deficiency 11/06/2011   Overactive bladder 10/12/2011   History of right breast cancer 10/12/2011   Home Medication(s) Prior to Admission medications   Medication Sig Start Date End Date Taking? Authorizing Provider  ciprofloxacin  (CIPRO ) 500 MG/5ML (10%) suspension Take 5 mLs (500 mg total) by mouth 2 (two) times daily for 7 days. Do NOT administer via tube 03/26/24 04/02/24 Yes Jabarie Pop, Raynell Moder, MD  acetaminophen  (TYLENOL ) 160 MG/5ML liquid Take 500 mg by mouth every 4 (four) hours as needed for fever.    [provider]  albuterol  (VENTOLIN  HFA) 108 (90 Base) MCG/ACT inhaler Inhale 2 puffs into the lungs every 6 (six) hours as needed for wheezing. 02/22/23   Bevin Bernice RAMAN, DO  ciprofloxacin  (CILOXAN ) 0.3 % ophthalmic solution Place 1 drop into the left eye every 2 (two) hours. Administer 1 drop, every 2 hours, while awake, for 2 days. Then 1 drop, every 4 hours, while awake, for the next 5 days. 12/21/23   Gudena, Vinay, MD  letrozole  (FEMARA ) 2.5 MG tablet Take 1 tablet (2.5 mg total) by mouth daily. 11/22/23   Gudena, Vinay, MD  LORazepam  (ATIVAN ) 0.5 MG tablet Take 1 tablet 1 hour before scan- may repeat at time of scan if needed. 10/25/23   Gudena, Vinay, MD  Spacer/Aero-Holding Chambers (AEROCHAMBER PLUS WITH MASK) inhaler Use with inhaler 02/22/23   Bevin Bernice RAMAN, DO  VERZENIO  100 MG tablet TAKE 1  TABLET BY MOUTH TWICE DAILY 03/08/24   Gudena, Vinay, MD  Vitamin D -Vitamin K (VITAMIN K2 -VITAMIN D3) 90-125 MCG CAPS Take 1 capsule by mouth daily at 12 noon. 12/21/23   Gudena, Vinay, MD                                                                                                                                    Allergies Atorvastatin, Bactrim   [sulfamethoxazole -trimethoprim ], Clavulanic acid, Guaifenesin & derivatives, and Tamoxifen  Review of Systems Review of Systems As noted in HPI  Physical Exam Vital Signs  I have reviewed the triage vital signs BP (!) 108/59   Pulse 92   Temp 98 F (36.7 C)   Resp 16   SpO2 94%   Physical Exam Vitals reviewed.  Constitutional:      General: She is not in acute distress.    Appearance: She is well-developed. She is not diaphoretic.  HENT:     Head: Normocephalic and atraumatic.     Right Ear: External ear normal.     Left Ear: External ear normal.     Nose: Nose normal.  Eyes:     General: No scleral icterus.    Conjunctiva/sclera: Conjunctivae normal.  Neck:     Trachea: Phonation normal.  Cardiovascular:     Rate and Rhythm: Normal rate and regular rhythm.  Pulmonary:     Effort: Pulmonary effort is normal. No respiratory distress.     Breath sounds: No stridor.  Abdominal:     General: There is no distension.      Comments: PEG site with surrounding erythema and mild edema. TTP.   Musculoskeletal:        General: Normal range of motion.     Cervical back: Normal range of motion.  Neurological:     Mental Status: She is alert and oriented to person, place, and time.  Psychiatric:        Behavior: Behavior normal.      ED Results and Treatments Labs (all labs ordered are listed, but only abnormal results are displayed) Labs Reviewed  CBC WITH DIFFERENTIAL/PLATELET - Abnormal; Notable for the following components:      Result Value   RBC 3.71 (*)    MCV 103.8 (*)    MCH 34.8 (*)    Lymphs Abs 0.6 (*)    All other components within normal limits  COMPREHENSIVE METABOLIC PANEL WITH GFR - Abnormal; Notable for the following components:   Glucose, Bld 104 (*)    BUN 29 (*)    Creatinine, Ser 1.01 (*)    GFR, Estimated 55 (*)    All other components within normal limits  EKG  EKG Interpretation Date/Time:    Ventricular Rate:    PR Interval:    QRS Duration:    QT Interval:    QTC Calculation:   R Axis:      Text Interpretation:         Radiology CT ABDOMEN PELVIS W CONTRAST Result Date: 03/26/2024 EXAM: CT ABDOMEN AND PELVIS WITH CONTRAST 03/26/2024 04:26:30 AM TECHNIQUE: CT of the abdomen and pelvis was performed with the administration of 25 mL of iohexol  (OMNIPAQUE ) 300 MG/ML solution. Multiplanar reformatted images are provided for review. Automated exposure control, iterative reconstruction, and/or weight-based adjustment of the mA/kV was utilized to reduce the radiation dose to as low as reasonably achievable. COMPARISON: CT of the chest, abdomen and pelvis dated 02/10/2024. CLINICAL HISTORY: PEG site erythema. FINDINGS: LOWER CHEST: Streaky opacities again demonstrated within the lower lobes bilaterally. There is an ovoid circumscribed calcified lesion again demonstrated arising from the right lateral eighth rib, measuring approximately 3.0 x 2.5 x 4.5 cm. There is moderate calcific coronary artery disease present. LIVER: The liver is enlarged, measuring approximately 21 cm in the midclavicular line. GALLBLADDER AND BILE DUCTS: Gallbladder is unremarkable. No biliary ductal dilatation. SPLEEN: No acute abnormality. PANCREAS: No acute abnormality. ADRENAL GLANDS: No acute abnormality. KIDNEYS, URETERS AND BLADDER: There is a 1 cm simple cyst within the lower pole of the left kidney. Per consensus, no follow-up is needed for simple Bosniak type 1 and 2 renal cysts, unless the patient has a malignancy history or risk factors. No stones in the kidneys or ureters. No hydronephrosis. No perinephric or periureteral stranding. Urinary bladder is unremarkable. GI AND BOWEL: There is a gastrostomy tube present with enteric contrast media in the duodenum and proximal small bowel. There is soft tissue thickening/inflammation along the  tract of the gastrostomy tube. The anchoring bulb does appear to be within the gastric lumen. There are numerous sigmoid diverticula, but no definite evidence of diverticulitis. There are few stones present within the base of the appendix, which is focally thickened, but the appendix is otherwise normal in caliber. There is no bowel obstruction. PERITONEUM AND RETROPERITONEUM: No ascites. No free air. VASCULATURE: Aorta is normal in caliber. There is moderate calcific atheromatous disease within the aortoiliac arteries. LYMPH NODES: No lymphadenopathy. REPRODUCTIVE ORGANS: The patient is status post hysterectomy and bilateral salpingo-oophorectomy. BONES AND SOFT TISSUES: There is widespread osseous metastatic disease throughout the visualized spine, pelvis and ribcage. No focal soft tissue abnormality. IMPRESSION: 1. Soft tissue thickening/inflammation along the tract of the gastrostomy tube; anchoring bulb appears intragastric. Correlate clinically for PEG site infection. 2. Hepatomegaly measuring approximately 21 cm in the midclavicular line. 3. Widespread osseous metastatic disease throughout the visualized spine, pelvis, and ribcage. Electronically signed by: Evalene Coho MD 03/26/2024 04:47 AM EST RP Workstation: [id number]    Medications Ordered in ED Medications  diatrizoate  meglumine -sodium (GASTROGRAFIN ) 66-10 % solution 50 mL (has no administration in time range)  LORazepam  (ATIVAN ) injection 0.25 mg (0.25 mg Intravenous Given 03/26/24 0304)  iohexol  (OMNIPAQUE ) 300 MG/ML solution 80 mL (80 mLs Intravenous Contrast Given 03/26/24 0426)  iohexol  (OMNIPAQUE ) 300 MG/ML solution 25 mL (25 mLs Per Tube Contrast Given 03/26/24 0403)   Procedures Procedures  (including critical care time) Medical Decision Making / ED Course   Medical Decision Making Amount and/or Complexity of Data Reviewed Labs: ordered. Decision-making details documented in ED Course. Radiology: ordered and independent  interpretation performed. Decision-making details documented in ED Course.  Risk Prescription drug management.  PEG dislodgement. Surrounding erythema and edema. DDX considered and work up below.  PEG tube replaced with same 20 French catheter.  No larger balloon size available.  Tube was inflated and pulled back.  At the skin measuring 6 cm.  Tube that was dislodged had the stopper almost touching the bulb.  Patient reported that she had been slowly adjusting the stopper to that level over the past 2 weeks.  This raises my concern for local irritation from medication or food resulting in the surrounding tissue edema and erythema.  Will obtain labs and CT to rule out obvious infectious process.  CBC without leukocytosis or anemia.  CMP without significant electrolyte derangements or renal sufficiency.  No evidence of bili obstruction.  CT scan with soft tissue thickening/inflammation along the tract of the gastrostomy tube - possible site infection; anchoring bulb appears intragastric.  Has IR r/u in 3 days. Will treat for possible infection with Cipro .    Final Clinical Impression(s) / ED Diagnoses Final diagnoses:  Complication of feeding tube [facility])   The patient appears reasonably screened and/or stabilized for discharge and I doubt any other medical condition or other [facility] requiring further screening, evaluation, or treatment in the ED at this time. I have discussed the findings, Dx and Tx plan with the patient/family who expressed understanding and agree(s) with the plan. Discharge instructions discussed at length. The patient/family was given strict return precautions who verbalized understanding of the instructions. No further questions at time of discharge.  Disposition: Discharge  Condition: Good  ED Discharge Orders          Ordered    ciprofloxacin  (CIPRO ) 500 MG/5ML (10%) suspension  2 times daily        03/26/24 0631             Follow Up: Alvan Dorothyann BIRCH,  MD [address] [place] [address] [phone]  Call  to schedule an appointment for close follow up  Interventional Radiology  Go on 03/28/2024 as scheduled    This chart was dictated using voice recognition software.  Despite best efforts to proofread,  errors can occur which can change the documentation meaning.    Trine Raynell Moder, MD 03/26/24 [phone]

## 2024-03-26 NOTE — ED Triage Notes (Signed)
 Pt arrived with family member after patient's peg tube came out tonight. Pt denies pain at this time. Reports she was lying in the bed tonight and the tube fell out. Daughter reports two days ago pt started having severe abd pain at the site, denies warmth or infection at the site.

## 2024-03-26 NOTE — ED Notes (Signed)
 PT did not want temp to be taken orally & asked for it to be taken axillary. NT attempted three times & was unsuccessful, triage RN notified; will reattempt temperature in room.

## 2024-03-27 ENCOUNTER — Other Ambulatory Visit: Payer: Self-pay

## 2024-03-27 ENCOUNTER — Other Ambulatory Visit (HOSPITAL_COMMUNITY): Payer: Self-pay

## 2024-03-27 MED ORDER — LETROZOLE 2.5 MG PO TABS
2.5000 mg | ORAL_TABLET | Freq: Every day | ORAL | 3 refills | Status: AC
  Filled 2024-03-31: qty 90, 90d supply, fill #0
  Filled 2024-04-15: qty 90, 90d supply, fill #1

## 2024-03-28 ENCOUNTER — Other Ambulatory Visit (HOSPITAL_COMMUNITY): Payer: Self-pay

## 2024-03-28 ENCOUNTER — Ambulatory Visit (HOSPITAL_COMMUNITY)
Admission: RE | Admit: 2024-03-28 | Discharge: 2024-03-28 | Disposition: A | Source: Ambulatory Visit | Attending: Diagnostic Radiology | Admitting: Diagnostic Radiology

## 2024-03-28 ENCOUNTER — Other Ambulatory Visit (HOSPITAL_COMMUNITY): Payer: Self-pay | Admitting: Diagnostic Radiology

## 2024-03-28 DIAGNOSIS — Z431 Encounter for attention to gastrostomy: Secondary | ICD-10-CM

## 2024-03-28 HISTORY — PX: IR RADIOLOGIST EVAL & MGMT: IMG5224

## 2024-03-28 NOTE — Progress Notes (Signed)
 Patient ID: Jodi Kaufman, female   DOB: August 11, 1940, 83 y.o.   MRN: [id number] Pt presented to IR dept today for G tube site check secondary to pain/leaking at site. On exam tube is intact, insertion site with small amount of nonbleeding granulation tissue, sl tender to palpation, minimal erythema. 11 cc saline was removed from existing balloon and 7 cc saline added with report of less abdominal discomfort for pt. Tube was made taut to skin, 1 layer drain sponge gauze placed under disc and tube flushed without difficulty. Pt told to contact IR team with any additional tube related concerns.

## 2024-03-29 ENCOUNTER — Telehealth: Payer: Self-pay

## 2024-03-29 NOTE — Telephone Encounter (Signed)
 Oral Oncology Patient Advocate Encounter   Received notification that prior authorization for VERZENIO   is required.   PA submitted on 03/29/2024 Key [id number] Status is pending      [name],  CPhT-Adv  she/her/hers [facility]  [facility] Pharmacy Technician Patient Advocate Specialist III [facility] Phone: [phone]  Fax: [phone] [email or web address]

## 2024-03-30 ENCOUNTER — Telehealth: Payer: Self-pay | Admitting: Pharmacist

## 2024-03-30 ENCOUNTER — Other Ambulatory Visit (HOSPITAL_COMMUNITY): Payer: Self-pay | Admitting: Radiology

## 2024-03-30 DIAGNOSIS — R1310 Dysphagia, unspecified: Secondary | ICD-10-CM

## 2024-03-30 NOTE — Telephone Encounter (Signed)
 Oral Oncology Patient Advocate Encounter  Prior Authorization Renewal for Verzenio  has been approved.    PA# [id number] Effective dates: 03/30/2024 through 04/26/2025    [name],  CPhT-Adv  she/her/hers [place]  [facility] Special[facility] Pharm[facility] Services Pharmacy Technician Patient Advocate Specialist III [facility] Phone: [phone]  Fax: [phone] [email or web address]

## 2024-03-30 NOTE — Telephone Encounter (Signed)
 [facility]        Telephone: [phone]?Fax: [phone]   Oncology Clinical Pharmacist Practitioner Progress Note   Jodi  Kaufman is a 83 y.o. female with a diagnosis of metastatic breast cancer currently on abemaciclib  + letrozole  + denosumab  120 mg under the care of Dr. [name].   I connected with Jodi  Kaufman today by telephone and verified that I was speaking with the correct person using two patient identifiers. I discussed the limitations, risks, security and privacy concerns of performing an evaluation and management service by telemedicine and the availability of in-person appointments. The patient/caregiver expressed understanding and agreed to proceed.  Other persons participating in the visit and their role in the encounter: self   Patient's location: home  Provider's location: clinic  [name] contacted clinical pharmacy because she is going to be starting oral ciprofloxacin  for an infection. We discussed out of an abundance of caution that Dr. Chad prefers patients hold abemaciclib  while on antibiotics because of the increased risk of diarrhea and the possibility of lowering infection fighting cells. She will start holding abemaciclib  today and resume once finished with ciprofloxacin .  She will see Dr. Chad with labs on 04/18/24 and contact the clinic sooner if needed.  Jodi  Kaufman participated in the discussion, expressed understanding, and voiced agreement with the above plan. All questions were answered to her satisfaction. The patient was advised to contact the clinic at [phone] with any questions or concerns prior to her return visit.  Clinical pharmacy will continue to support Jodi  Kaufman and Dr. [name] as needed.  Dondra Rhett A. Lucila, PharmD, BCOP, CPP  [name], RPH-CPP,  03/30/2024  3:02 PM   **Disclaimer: This note was dictated with voice recognition software. Similar sounding words can inadvertently be  transcribed and this note may contain transcription errors which may not have been corrected upon publication of note.**

## 2024-03-31 ENCOUNTER — Other Ambulatory Visit (HOSPITAL_COMMUNITY)

## 2024-03-31 ENCOUNTER — Other Ambulatory Visit: Payer: Self-pay

## 2024-03-31 ENCOUNTER — Other Ambulatory Visit (HOSPITAL_COMMUNITY): Payer: Self-pay

## 2024-04-02 ENCOUNTER — Other Ambulatory Visit (HOSPITAL_COMMUNITY): Payer: Self-pay

## 2024-04-03 ENCOUNTER — Telehealth: Payer: Self-pay

## 2024-04-03 NOTE — Telephone Encounter (Signed)
 Received confirmation of successful fax transmission of signed statement of medical necessity.

## 2024-04-05 ENCOUNTER — Other Ambulatory Visit (HOSPITAL_COMMUNITY)

## 2024-04-06 ENCOUNTER — Telehealth: Payer: Self-pay | Admitting: *Deleted

## 2024-04-06 ENCOUNTER — Ambulatory Visit: Payer: Self-pay

## 2024-04-06 NOTE — Telephone Encounter (Signed)
 Received call from pt with complaint of GI distress and diarrhea since starting a new prescription of Letrozole  on Monday.  Pt states she is unsure if she received a different manufacture of letrozole  and is having a reaction to that or if she caught a stomach bug.  Per MD pt to hold Letrozole  x1 to 2 weeks until symptoms resolve and then re-start Letrozole .  If symptoms develop again, then it is related to Letrozole  and pharmacy will need to be notified to change manufactures.  Pt notified and verbalized understanding.

## 2024-04-06 NOTE — Telephone Encounter (Signed)
 FYI Only or Action Required?: FYI only for provider: appointment scheduled on 04/07/24.  Patient was last seen in primary care on 08/30/2023 by Alvan Dorothyann BIRCH, MD.  Called Nurse Triage reporting Cough.  Symptoms began several days ago.  Interventions attempted: Nothing.  Symptoms are: unchanged.  Triage Disposition: See Physician Within 24 Hours  Patient/caregiver understands and will follow disposition?: Yes    Copied from CRM #[id number]. Topic: Clinical - Red Word Triage >> Apr 06, 2024  4:03 PM Wess RAMAN wrote: Red Word that prompted transfer to Nurse Triage: Cough, yellow mucus discharge, diarrhea.  Would like an appt with Dr. Alvan. Reason for Disposition  [1] Continuous (nonstop) coughing interferes with work or school AND [2] no improvement using cough treatment per Care Advice  Answer Assessment - Initial Assessment Questions No appt with pcp. Patient requests video visit, scheduled with alt provider 04/07/24.  Advised call back or ED/911 if symptoms worsen: severe dif breathing, faint/ weakness, chest pain last more than 5 minutes.  Patient verbalized understanding.  Patient reports called cancer office regarding diarrhea, office told patient to stop taking Letrozole .  1. ONSET: When did the cough begin?      Ongoing for 4 years with mucous and hacking for month 2. SEVERITY: How bad is the cough today?      Moderate;  Cough, chest congestion, diarrhea 3. SPUTUM: Describe the color of your sputum (e.g., none, dry cough; clear, white, yellow, green)     Yellow and green phlegm 4. HEMOPTYSIS: Are you coughing up any blood? If Yes, ask: How much? (e.g., flecks, streaks, tablespoons, etc.)     no 5. DIFFICULTY BREATHING: Are you having difficulty breathing? If Yes, ask: How bad is it? (e.g., mild, moderate, severe)      no 6. FEVER: Do you have a fever? If Yes, ask: What is your temperature, how was it measured, and when did it start?      Denies fever, chills, vomiting Nausea, diarrhea 24 hours 6 watery and loose, brown; denies blood, mucous Hydration:urinating as normal 7. CARDIAC HISTORY: Do you have any history of heart disease? (e.g., heart attack, congestive heart failure)      no 8. LUNG HISTORY: Do you have any history of lung disease?  (e.g., pulmonary embolus, asthma, emphysema)     no 9. PE RISK FACTORS: Do you have a history of blood clots? (or: recent major surgery, recent prolonged travel, bedridden)     no 10. OTHER SYMPTOMS: Do you have any other symptoms? (e.g., runny nose, wheezing, chest pain)       Cancer treatments, nasal drainage Denies sore throat wheezing chest pain  Protocols used: Cough - Acute Productive-A-AH

## 2024-04-07 ENCOUNTER — Encounter: Payer: Self-pay | Admitting: Family Medicine

## 2024-04-07 ENCOUNTER — Telehealth: Payer: Self-pay

## 2024-04-07 ENCOUNTER — Telehealth (INDEPENDENT_AMBULATORY_CARE_PROVIDER_SITE_OTHER): Admitting: Family Medicine

## 2024-04-07 VITALS — Ht 66.5 in | Wt 135.0 lb

## 2024-04-07 DIAGNOSIS — R197 Diarrhea, unspecified: Secondary | ICD-10-CM | POA: Insufficient documentation

## 2024-04-07 DIAGNOSIS — J0140 Acute pansinusitis, unspecified: Secondary | ICD-10-CM | POA: Diagnosis not present

## 2024-04-07 DIAGNOSIS — J019 Acute sinusitis, unspecified: Secondary | ICD-10-CM | POA: Insufficient documentation

## 2024-04-07 MED ORDER — DOXYCYCLINE HYCLATE 100 MG PO TABS: 100.0000 mg | ORAL_TABLET | Freq: Two times a day (BID) | ORAL | 0 refills | Status: DC

## 2024-04-07 NOTE — Assessment & Plan Note (Signed)
 Treating with course of doxycycline .  Additionally recommended supportive care with increased fluid intake. Red flags reviewed.

## 2024-04-07 NOTE — Telephone Encounter (Signed)
 Patient concerned about her insurance paying for her appointment- call to office- she does have double coverage- so if [facility] denies- her other coverage should pick up the charge- she agrees to go ahead with appointment .

## 2024-04-07 NOTE — Progress Notes (Signed)
 Jodi  Kaufman - 83 y.o. female MRN [id number]  Date of birth: 08/13/1940   All issues noted in this document were discussed and addressed.  No physical exam was performed (except for noted visual exam findings with Video Visits).  I discussed the limitations of evaluation and management by telemedicine and the availability of in person appointments. The patient expressed understanding and agreed to proceed.  I connected withNAME@ on 04/07/2024 at 10:30 AM EST by a video enabled telemedicine application and verified that I am speaking with the correct person using two identifiers.  Present at visit: Velma Ku, DO Jodi  Kaufman   Patient Location: Home [address] [place] [address]   Provider location:   [facility]  Chief Complaint  Patient presents with   URI    HPI  Jodi  Kaufman is a 83 y.o. female who presents via audio/video conferencing for a telehealth visit today.  She has complaint of sinus congestion with pressure and pain along with headaches.  Has post nasal drainage that is causing her to cough.  Phlegm is thick and green/yellow appearing.  Symptoms initially started about 5 weeks ago and have worsened recently.    Denies fever.   She has also had diarrhea for the past 4 days.  Manufacturer of her letrozole  changed.  She contacted her oncologist who recommended holding this.  Diarrhea is improved some today.  Denies abdominal pain.  Does have some mild nausea.  Has PEG tube so doesn't really notice is appetite is changed.    ROS:  A comprehensive ROS was completed and negative except as noted per HPI  Past Medical History:  Diagnosis Date   Breast cancer [facility])     Past Surgical History:  Procedure Laterality Date   BREAST BIOPSY     IR GASTROSTOMY TUBE MOD SED  04/15/2020   IR PATIENT EVAL TECH 0-60 MINS  08/22/2020   IR PATIENT EVAL TECH 0-60 MINS  08/29/2021   IR PATIENT EVAL TECH 0-60 MINS  09/24/2021   IR RADIOLOGIST EVAL & MGMT  11/10/2022   IR  RADIOLOGIST EVAL & MGMT  03/28/2024   IR REPLC GASTRO/COLONIC TUBE PERCUT W/FLUORO  08/25/2023   MASTECTOMY     MASTECTOMY W/ SENTINEL NODE BIOPSY     Right. 18 years ago.      Family History  Problem Relation Age of Onset   Heart disease Mother    Anemia Mother        penicous anemia.    Alzheimer's disease Mother     Social History   Socioeconomic History   Marital status: Single    Spouse name: Not on file   Number of children: Not on file   Years of education: Not on file   Highest education level: Not on file  Occupational History   Not on file  Tobacco Use   Smoking status: Never   Smokeless tobacco: Never  Substance and Sexual Activity   Alcohol use: Never    Comment: [place].     Drug use: Never   Sexual activity: Not Currently  Other Topics Concern   Not on file  Social History Narrative   Lives in [place] but stays with her daughter 1-2 weeks out of the month.     Social Drivers of Health   Tobacco Use: Low Risk (03/26/2024)   Patient History    Smoking Tobacco Use: Never    Smokeless Tobacco Use: Never    Passive Exposure: Not on file  Financial Resource Strain:  Not on file  Food Insecurity: Not on file  Transportation Needs: Not on file  Physical Activity: Not on file  Stress: Not on file  Social Connections: Not on file  Intimate Partner Violence: Not on file  Depression (PHQ2-9): Low Risk (11/26/2022)   Depression (PHQ2-9)    PHQ-2 Score: 0  Alcohol Screen: Not on file  Housing: Not on file  Utilities: Not on file  Health Literacy: Not on file    Current Medications[1]  EXAM:  VITALS per patient if applicable: Ht 5' 6.5 (1.689 m)   Wt 135 lb (61.2 kg)   BMI 21.46 kg/m   GENERAL: alert, oriented, appears well and in no acute distress  HEENT: atraumatic, conjunttiva clear, no obvious abnormalities on inspection of external nose and ears  NECK: normal movements of the head and neck  LUNGS: on inspection no signs of respiratory distress,  breathing rate appears normal, no obvious gross SOB, gasping or wheezing  CV: no obvious cyanosis  MS: moves all visible extremities without noticeable abnormality  PSYCH/NEURO: pleasant and cooperative, no obvious depression or anxiety, speech and thought processing grossly intact  ASSESSMENT AND PLAN:  Discussed the following assessment and plan:  Acute sinusitis Treating with course of doxycycline .  Additionally recommended supportive care with increased fluid intake. Red flags reviewed.   Diarrhea Seems to be improved with holding letrozole .  Recommend in person visit if symptoms worsen again.      I discussed the assessment and treatment plan with the patient. The patient was provided an opportunity to ask questions and all were answered. The patient agreed with the plan and demonstrated an understanding of the instructions.   The patient was advised to call back or seek an in-person evaluation if the symptoms worsen or if the condition fails to improve as anticipated.    Velma Ku, DO      [1]  Current Outpatient Medications:    doxycycline  (VIBRA -TABS) 100 MG tablet, Take 1 tablet (100 mg total) by mouth 2 (two) times daily., Disp: 20 tablet, Rfl: 0   acetaminophen  (TYLENOL ) 160 MG/5ML liquid, Take 500 mg by mouth every 4 (four) hours as needed for fever., Disp: , Rfl:    albuterol  (VENTOLIN  HFA) 108 (90 Base) MCG/ACT inhaler, Inhale 2 puffs into the lungs every 6 (six) hours as needed for wheezing., Disp: 8.5 g, Rfl: 11   ciprofloxacin  (CILOXAN ) 0.3 % ophthalmic solution, Place 1 drop into the left eye every 2 (two) hours. Administer 1 drop, every 2 hours, while awake, for 2 days. Then 1 drop, every 4 hours, while awake, for the next 5 days., Disp: 10 mL, Rfl: 0   letrozole  (FEMARA ) 2.5 MG tablet, Take 1 tablet (2.5 mg total) by mouth daily., Disp: 90 tablet, Rfl: 3   LORazepam  (ATIVAN ) 0.5 MG tablet, Take 1 tablet 1 hour before scan- may repeat at time of scan if  needed., Disp: 10 tablet, Rfl: 0   Spacer/Aero-Holding Chambers (AEROCHAMBER PLUS WITH MASK) inhaler, Use with inhaler, Disp: 1 each, Rfl: 3   VERZENIO  100 MG tablet, TAKE 1 TABLET BY MOUTH TWICE DAILY, Disp: 56 tablet, Rfl: 5   Vitamin D -Vitamin K (VITAMIN K2 -VITAMIN D3) 90-125 MCG CAPS, Take 1 capsule by mouth daily at 12 noon., Disp: , Rfl:

## 2024-04-07 NOTE — Assessment & Plan Note (Signed)
 Seems to be improved with holding letrozole .  Recommend in person visit if symptoms worsen again.

## 2024-04-10 ENCOUNTER — Telehealth: Payer: Self-pay | Admitting: *Deleted

## 2024-04-10 ENCOUNTER — Inpatient Hospital Stay (HOSPITAL_COMMUNITY): Admission: RE | Admit: 2024-04-10 | Source: Ambulatory Visit

## 2024-04-10 NOTE — Telephone Encounter (Signed)
 Pt called to make office aware that she has held her letrozole  and Verzenio  while she is on an ABT for stomach infection. Advised to restart after completing ABT regimen. Also, she will be going to have peg tube evaluated due to leakage. Advised to f/u as scheduled. Pt verbalized understanding.

## 2024-04-11 NOTE — Telephone Encounter (Signed)
 Encounter opened in error.

## 2024-04-14 ENCOUNTER — Encounter (HOSPITAL_COMMUNITY): Payer: Self-pay

## 2024-04-14 ENCOUNTER — Ambulatory Visit (HOSPITAL_COMMUNITY)
Admission: RE | Admit: 2024-04-14 | Discharge: 2024-04-14 | Disposition: A | Discharge status: Home / Self Care | Source: Ambulatory Visit | Attending: Radiology | Admitting: Radiology

## 2024-04-14 ENCOUNTER — Other Ambulatory Visit (HOSPITAL_COMMUNITY)

## 2024-04-14 ENCOUNTER — Other Ambulatory Visit (HOSPITAL_COMMUNITY): Payer: Self-pay | Admitting: Radiology

## 2024-04-14 DIAGNOSIS — R1310 Dysphagia, unspecified: Secondary | ICD-10-CM

## 2024-04-14 HISTORY — PX: IR PATIENT EVAL TECH 0-60 MINS: IMG5564

## 2024-04-14 NOTE — Procedures (Signed)
 Patient presented to IR today with complaints of leaking around Gastrostomy Tube site.  Upon arrival, it was noted that the bumper of her gastrostomy tube was raised significantly, causing the tube to be very loose and not against her skin. It was found that the g tube site was leaking due to her tube being too loose.  Patient stated that she will sometimes move the tube and bumper around when trying to clean it.  It was explained that the tube should not be adjusted, and the patient should not put anymore than a SINGLE split gauze under the bumper of her G Tube, only to assist with the bumper not irritating the skin.  The bumper on the G Tube was tightened and then flushed multiple times to ensure there was no leaking at the site or around it.  Patient was then shown how the tube needs to be, how the bumper should be, and it was reiterated that there should be no more than one split gauze placed under the bumper against the patients skin.  Patient was also instructed to please call IR with any questions or concerns regarding her Gastrostomy Tube.

## 2024-04-15 ENCOUNTER — Encounter: Payer: Self-pay | Admitting: Hematology and Oncology

## 2024-04-15 ENCOUNTER — Other Ambulatory Visit (HOSPITAL_COMMUNITY): Payer: Self-pay

## 2024-04-17 ENCOUNTER — Other Ambulatory Visit (HOSPITAL_COMMUNITY): Payer: Self-pay

## 2024-04-17 ENCOUNTER — Other Ambulatory Visit: Payer: Self-pay

## 2024-04-18 ENCOUNTER — Inpatient Hospital Stay

## 2024-04-18 ENCOUNTER — Inpatient Hospital Stay: Attending: Hematology and Oncology

## 2024-04-18 ENCOUNTER — Other Ambulatory Visit (HOSPITAL_COMMUNITY): Payer: Self-pay

## 2024-04-18 ENCOUNTER — Inpatient Hospital Stay (HOSPITAL_BASED_OUTPATIENT_CLINIC_OR_DEPARTMENT_OTHER): Admitting: Hematology and Oncology

## 2024-04-18 VITALS — BP 122/58 | HR 63 | Temp 97.6°F | Resp 18 | Ht 66.5 in | Wt 170.6 lb

## 2024-04-18 DIAGNOSIS — Z79811 Long term (current) use of aromatase inhibitors: Secondary | ICD-10-CM | POA: Insufficient documentation

## 2024-04-18 DIAGNOSIS — C50911 Malignant neoplasm of unspecified site of right female breast: Secondary | ICD-10-CM | POA: Diagnosis present

## 2024-04-18 DIAGNOSIS — C7931 Secondary malignant neoplasm of brain: Secondary | ICD-10-CM | POA: Diagnosis not present

## 2024-04-18 DIAGNOSIS — Z7960 Long term (current) use of unspecified immunomodulators and immunosuppressants: Secondary | ICD-10-CM | POA: Diagnosis not present

## 2024-04-18 DIAGNOSIS — Z1722 Progesterone receptor negative status: Secondary | ICD-10-CM | POA: Insufficient documentation

## 2024-04-18 DIAGNOSIS — Z1732 Human epidermal growth factor receptor 2 negative status: Secondary | ICD-10-CM | POA: Insufficient documentation

## 2024-04-18 DIAGNOSIS — C7951 Secondary malignant neoplasm of bone: Secondary | ICD-10-CM

## 2024-04-18 DIAGNOSIS — C50919 Malignant neoplasm of unspecified site of unspecified female breast: Secondary | ICD-10-CM

## 2024-04-18 DIAGNOSIS — Z170 Estrogen receptor positive status [ER+]: Secondary | ICD-10-CM | POA: Diagnosis not present

## 2024-04-18 LAB — CMP (CANCER CENTER ONLY)
ALT: 26 U/L (ref 0–44)
AST: 35 U/L (ref 15–41)
Albumin: 4.3 g/dL (ref 3.5–5.0)
Alkaline Phosphatase: 58 U/L (ref 38–126)
Anion gap: 9 (ref 5–15)
BUN: 29 mg/dL — ABNORMAL HIGH (ref 8–23)
CO2: 27 mmol/L (ref 22–32)
Calcium: 9.7 mg/dL (ref 8.9–10.3)
Chloride: 103 mmol/L (ref 98–111)
Creatinine: 0.94 mg/dL (ref 0.44–1.00)
GFR, Estimated: >60 mL/min
Glucose, Bld: 99 mg/dL (ref 70–99)
Potassium: 4.7 mmol/L (ref 3.5–5.1)
Sodium: 139 mmol/L (ref 135–145)
Total Bilirubin: 0.6 mg/dL (ref 0.0–1.2)
Total Protein: 6.9 g/dL (ref 6.5–8.1)

## 2024-04-18 LAB — CBC WITH DIFFERENTIAL (CANCER CENTER ONLY)
Abs Immature Granulocytes: 0 K/uL (ref 0.00–0.07)
Basophils Absolute: 0 K/uL (ref 0.0–0.1)
Basophils Relative: 1 %
Eosinophils Absolute: 0.1 K/uL (ref 0.0–0.5)
Eosinophils Relative: 3 %
HCT: 40.7 % (ref 36.0–46.0)
Hemoglobin: 13.8 g/dL (ref 12.0–15.0)
Immature Granulocytes: 0 %
Lymphocytes Relative: 12 %
Lymphs Abs: 0.6 K/uL — ABNORMAL LOW (ref 0.7–4.0)
MCH: 34.2 pg — ABNORMAL HIGH (ref 26.0–34.0)
MCHC: 33.9 g/dL (ref 30.0–36.0)
MCV: 100.7 fL — ABNORMAL HIGH (ref 80.0–100.0)
Monocytes Absolute: 0.6 K/uL (ref 0.1–1.0)
Monocytes Relative: 13 %
Neutro Abs: 3.2 K/uL (ref 1.7–7.7)
Neutrophils Relative %: 71 %
Platelet Count: 174 K/uL (ref 150–400)
RBC: 4.04 MIL/uL (ref 3.87–5.11)
RDW: 13.7 % (ref 11.5–15.5)
WBC Count: 4.4 K/uL (ref 4.0–10.5)
nRBC: 0 % (ref 0.0–0.2)

## 2024-04-18 MED ORDER — DENOSUMAB 120 MG/1.7ML ~~LOC~~ SOLN
120.0000 mg | Freq: Once | SUBCUTANEOUS | Status: AC
  Administered 2024-04-18: 120 mg via SUBCUTANEOUS
  Filled 2024-04-18: qty 1.7

## 2024-04-18 NOTE — Progress Notes (Signed)
 "  Patient Care Team: Alvan Dorothyann BIRCH, MD as PCP - General (Family Medicine) Odean Potts, MD as Consulting Physician (Hematology and Oncology)  DIAGNOSIS:  Encounter Diagnosis  Name Primary?   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) Yes    SUMMARY OF ONCOLOGIC HISTORY: Oncology History  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)  1995 Initial Biopsy   Right breast cancer stage IIb ER positive IDC s/p mastectomy and 2 months of tamoxifen, lost to follow-up (treatment at [facility] )   03/12/2020 - 03/26/2020 Radiation Therapy   Palliative radiation to the brain and spine for brain metastases   03/14/2020 Initial Diagnosis   Scans on 03/14/2020: Multiple bone metastases and brain metastases, biopsy of the rib: Breast cancer ER 80% PR negative HER-2 negative.  Moved to [facility] December 2021 to be closer to her family   02/2020 -  Anti-estrogen oral therapy   Anastrozole  daily   10/21/2020 Treatment Plan Change   Xgeva  every 3 months   10/06/2022 Imaging   IMPRESSION: 1. Slight interval enlargement of bulky right subpectoral lymph nodes, consistent with worsened nodal metastatic disease. 2. Multiple unchanged mixed lytic and sclerotic osseous metastatic lesions, notable for a large, expansile lesion of the right second rib as well as numerous additional expansile rib lesions, thoracolumbar vertebral body lesions, and pelvic lesions. 3. No other evidence of metastatic disease in the chest, abdomen, or pelvis. 4. Coronary artery disease. 5. Aortic valve calcifications. Correlate for echocardiographic evidence of aortic valve dysfunction.   Aortic Atherosclerosis (ICD10-I70.0).     Electronically Signed   By: [name]   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  She has metastatic breast cancer with bone and brain involvement treated with letrozole , abemaciclib , and Xgeva . Letrozole  and abemaciclib  were held during a doxycycline  course for a bacterial infection per prior oncology guidance, and she plans to resume both tomorrow.  She reports a recent bacterial infection with malaise and abdominal symptoms, now about 85% improved after completing doxycycline . She denies current pain or fever, and her temperature is at baseline (97.8-97.29F).  She has not had new or worsening pain attributable to metastatic disease. A CT scan was done about a month ago and no new imaging is planned.  She had prolonged bruising and discomfort after multiple IV attempts and blood draws during a recent ER visit, with ecchymosis persisting over two weeks. She asked about ongoing bone monitoring and requested a copy of her recent laboratory results.       ALLERGIES:  is allergic to atorvastatin, bactrim  [sulfamethoxazole -trimethoprim ], clavulanic acid, guaifenesin & derivatives, and tamoxifen.  MEDICATIONS:  Current Outpatient Medications  Medication Sig Dispense Refill   acetaminophen  (TYLENOL ) 160 MG/5ML liquid Take 500 mg by mouth every 4 (four) hours as needed for fever.     albuterol  (VENTOLIN  HFA) 108 (90 Base) MCG/ACT inhaler Inhale 2 puffs into the lungs every 6 (six) hours as needed for wheezing. 8.5 g 11   ciprofloxacin  (  CILOXAN ) 0.3 % ophthalmic solution Place 1 drop into the left eye every 2 (two) hours. Administer 1 drop, every 2 hours,  while awake, for 2 days. Then 1 drop, every 4 hours, while awake, for the next 5 days. 10 mL 0   doxycycline  (VIBRA -TABS) 100 MG tablet Take 1 tablet (100 mg total) by mouth 2 (two) times daily. 20 tablet 0   letrozole  (FEMARA ) 2.5 MG tablet Take 1 tablet (2.5 mg total) by mouth daily. 90 tablet 3   LORazepam  (ATIVAN ) 0.5 MG tablet Take 1 tablet 1 hour before scan- may repeat at time of scan if needed. 10 tablet 0   Spacer/Aero-Holding Chambers (AEROCHAMBER PLUS WITH MASK) inhaler Use with inhaler 1 each 3   VERZENIO  100 MG tablet TAKE 1 TABLET BY MOUTH TWICE DAILY 56 tablet 5   Vitamin D -Vitamin K (VITAMIN K2 -VITAMIN D3) 90-125 MCG CAPS Take 1 capsule by mouth daily at 12 noon.     No current facility-administered medications for this visit.    PHYSICAL EXAMINATION: ECOG PERFORMANCE STATUS: 1 - Symptomatic but completely ambulatory  Vitals:   04/18/24 1356  BP: (!) 122/58  Pulse: 63  Resp: 18  Temp: 97.6 F (36.4 C)  SpO2: 97%   Filed Weights   04/18/24 1356  Weight: 170 lb 9.6 oz (77.4 kg)      LABORATORY DATA:  I have reviewed the data as listed    Latest Ref Rng & Units 04/18/2024    1:20 PM 03/26/2024    3:05 AM 02/17/2024   12:56 PM  CMP  Glucose 70 - 99 mg/dL 99  895  895   BUN 8 - 23 mg/dL 29  29  32   Creatinine 0.44 - 1.00 mg/dL 9.05  8.98  8.83   Sodium 135 - 145 mmol/L 139  137  140   Potassium 3.5 - 5.1 mmol/L 4.7  4.3  5.0   Chloride 98 - 111 mmol/L 103  102  103   CO2 22 - 32 mmol/L 27  27  30    Calcium 8.9 - 10.3 mg/dL 9.7  9.4  9.8   Total Protein 6.5 - 8.1 g/dL 6.9  6.6  7.2   Total Bilirubin 0.0 - 1.2 mg/dL 0.6  0.7  0.8   Alkaline Phos 38 - 126 U/L 58  54  53   AST 15 - 41 U/L 35  25  24   ALT 0 - 44 U/L 26  16  16      Lab Results  Component Value Date   WBC 4.4 04/18/2024   HGB 13.8 04/18/2024   HCT 40.7 04/18/2024   MCV 100.7 (H) 04/18/2024   PLT 174 04/18/2024   NEUTROABS 3.2 04/18/2024    ASSESSMENT & PLAN:  Primary malignant  neoplasm of breast with metastasis (HCC) Originally diagnosed in 1995, patient did not take prescribed tamoxifen. Now presenting with metastatic disease based on scans done 03/11/2020 CT CAP: Numerous lytic lesions in the thoracic spine several with expansion extraosseous extension. 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  Uncertain if any radiation was given to the skull lesions.   Current Treatment:   letrozole . Started December 2021, discontinued for progression and switched to Faslodex  10/15/2022   Poor nutritional status: Since the tube feedings have started her weight is improving.  Energy levels are improving and  she is got a better positive outlook.    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  No evidence of visceral metastatic disease. CT CAP 12/30/2022: No significant interval change in the bone metastases.  Stable bulky subpectoral lymph nodes. CT CAP 04/29/2023: No significant interval change in the bone metastasis, no change in the bulky right subpectoral lymph nodes, new hypodense foci in the cecum (?  Ingested material or tablets) Guardant360 05/11/2023: PIK3CA mutation, RAF 1 mutation, TMB 21.35, MSI not detected I had previously discussed with her about different options for treatment including Verzinio, Capivasertib or Keytruda immunotherapy.  CT CAP 08/18/2023: Slight progression of subpectoral lymph node from 6.1 cm to 6.4 cm, stable bone metastases   Current treatment: Verzinio with letrozole  started 12/14/2023 Toxicities: Constipation alternating with loose stools  Fatigue   CT CAP 02/10/2024: Interval decrease in the right subpectoral lymph node from 5.3 cm to 3.6 cm, stable bone metastases Since you are responding very well to treatment we will continue with the  Verzinio and letrozole .  CT abdomen 03/26/2024: Soft tissue thickening/inflammation along the gastrostomy tube, hepatomegaly, bone metastases Completed antibiotics with doxycycline  as of today 04/18/2024 Instructed to resume Verzinio and letrozole  therapy at this time.  Follow-up in 3 months with labs.  Scans can be done every 6 months going forwards. Assessment & Plan Metastatic breast cancer to bone Recurrent metastatic ER-positive, PR-negative, HER2-negative breast carcinoma with partial nodal response. Managed on letrozole  and abemaciclib  with mild gastrointestinal toxicity. Denosumab  administered for bone metastases. Prognosis guarded but responding to therapy. - Resume letrozole  (2.5 mg daily) and abemaciclib  (100 mg BID) after antibiotics. - Administer denosumab  (Xgeva ) every 3 months. - Monitor for loose stools, cytopenias, and infectious symptoms on abemaciclib . - CT scan every 3 months for surveillance. - Regular lab monitoring; next labs and denosumab  in 2 months. - Supportive care for bone metastases; monitor skeletal events. - Discuss clinical trial eligibility if disease progresses, especially with PIK3CA mutation. - Monitor long-term adverse effects of therapy. - Follow up in 2 months for labs and denosumab , 3 months for CT and review.  Bacterial infection under treatment Completed doxycycline  for resolving bacterial infection. Approximately 85% improvement with no fever or pain. Cancer therapy to resume post-antibiotics. - Complete final dose of doxycycline  today. - Resume letrozole  and abemaciclib  after antibiotics. - Report new infectious symptoms promptly, especially on immunosuppressive therapy.      No orders of the defined types were placed in this encounter.  The patient has a good understanding of the overall plan. she agrees with it. she will call with any problems that may develop before the next visit here.  I personally spent a total of 30 minutes in the  care of the patient today including preparing to see the patient, getting/reviewing separately obtained history, performing a medically appropriate exam/evaluation, counseling and educating, placing orders, referring and communicating with other health care professionals, documenting clinical information in the EHR, independently interpreting results, communicating results, and coordinating care.   Viinay K Milagros Middendorf, MD 04/18/2024    "

## 2024-04-18 NOTE — Assessment & Plan Note (Signed)
 Originally diagnosed in 1995, patient did not take prescribed tamoxifen. Now presenting with metastatic disease based on scans done 03/11/2020 CT CAP: Numerous lytic lesions in the thoracic spine several with expansion extraosseous extension. 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  Uncertain if any radiation was given to the skull lesions.   Current Treatment:   letrozole . Started December 2021, discontinued for progression and switched to Faslodex  10/15/2022   Poor nutritional status: Since the tube feedings have started her weight is improving.  Energy levels are improving and she is got a better positive outlook.    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  No evidence of visceral metastatic disease. CT CAP 12/30/2022: No significant interval change in the bone metastases.  Stable bulky subpectoral lymph nodes. CT CAP 04/29/2023: No significant interval change in the bone metastasis, no change in the bulky right subpectoral lymph nodes, new hypodense foci in the cecum (?  Ingested material or tablets) Guardant360 05/11/2023: PIK3CA mutation, RAF 1 mutation, TMB 21.35, MSI not detected I had previously discussed with her about different options for treatment including Verzinio, Capivasertib or Keytruda immunotherapy.  CT CAP 08/18/2023: Slight progression of subpectoral lymph node from 6.1 cm to 6.4 cm, stable bone metastases   Current treatment: Verzinio with letrozole  started 12/14/2023 Toxicities: Constipation alternating with loose stools  Fatigue   CT CAP 02/10/2024: Interval decrease in the right subpectoral lymph node from 5.3 cm to 3.6 cm, stable bone metastases Since you are responding very well to treatment we will continue with the Verzinio and letrozole .  CT abdomen  03/26/2024: Soft tissue thickening/inflammation along the gastrostomy tube, hepatomegaly, bone metastases  Plan is to obtain CT CAP end of February 2026

## 2024-04-19 ENCOUNTER — Other Ambulatory Visit (HOSPITAL_COMMUNITY): Payer: Self-pay

## 2024-04-19 LAB — CANCER ANTIGEN 27.29: CA 27.29: 323.8 U/mL — ABNORMAL HIGH (ref 0.0–38.6)

## 2024-04-21 ENCOUNTER — Telehealth: Payer: Self-pay

## 2024-04-21 NOTE — Telephone Encounter (Addendum)
 PAP reenrollment for year 2026    Application has been submitted for Verzenio  through [facility] with both Patient and doctor signatures, along with requested documentation.  Status: Approved through 04/26/2025

## 2024-05-05 ENCOUNTER — Other Ambulatory Visit: Payer: Self-pay

## 2024-05-05 ENCOUNTER — Emergency Department (HOSPITAL_COMMUNITY)

## 2024-05-05 ENCOUNTER — Other Ambulatory Visit (HOSPITAL_COMMUNITY): Payer: Self-pay | Admitting: Radiology

## 2024-05-05 ENCOUNTER — Emergency Department (HOSPITAL_COMMUNITY)
Admission: EM | Admit: 2024-05-05 | Discharge: 2024-05-05 | Disposition: A | Discharge status: Home / Self Care | Attending: Emergency Medicine | Admitting: Emergency Medicine

## 2024-05-05 ENCOUNTER — Other Ambulatory Visit (HOSPITAL_COMMUNITY)

## 2024-05-05 DIAGNOSIS — Z853 Personal history of malignant neoplasm of breast: Secondary | ICD-10-CM | POA: Insufficient documentation

## 2024-05-05 DIAGNOSIS — K9423 Gastrostomy malfunction: Secondary | ICD-10-CM | POA: Insufficient documentation

## 2024-05-05 DIAGNOSIS — R633 Feeding difficulties, unspecified: Secondary | ICD-10-CM

## 2024-05-05 MED ORDER — IOHEXOL 300 MG/ML  SOLN
30.0000 mL | Freq: Once | INTRAMUSCULAR | Status: AC | PRN
  Administered 2024-05-05: 30 mL

## 2024-05-05 MED ORDER — FENTANYL CITRATE (PF) 50 MCG/ML IJ SOSY
50.0000 ug | PREFILLED_SYRINGE | Freq: Once | INTRAMUSCULAR | Status: AC
  Administered 2024-05-05: 50 ug via INTRAMUSCULAR
  Filled 2024-05-05: qty 1

## 2024-05-05 NOTE — ED Provider Notes (Signed)
 " [place] [facility]MENT AT [facility] Provider Note   CSN: [id number] Arrival date & time: 05/05/24  1623     Patient presents with: peg tube removal   Jodi  Kaufman is a 84 y.o. female with history of breast cancer, PEG tube, presents with concern for PEG tube coming out today.  Reports that it seemed to slip out, and she reports that the liquid in the balloon seem to leak out.  Denies any concerns with the site.  No abdominal pain or fevers.   HPI     Prior to Admission medications  Medication Sig Start Date End Date Taking? Authorizing Provider  acetaminophen  (TYLENOL ) 160 MG/5ML liquid Take 500 mg by mouth every 4 (four) hours as needed for fever.    [provider]  albuterol  (VENTOLIN  HFA) 108 (90 Base) MCG/ACT inhaler Inhale 2 puffs into the lungs every 6 (six) hours as needed for wheezing. 02/22/23   Bevin Bernice RAMAN, DO  ciprofloxacin  (CILOXAN ) 0.3 % ophthalmic solution Place 1 drop into the left eye every 2 (two) hours. Administer 1 drop, every 2 hours, while awake, for 2 days. Then 1 drop, every 4 hours, while awake, for the next 5 days. 12/21/23   Gudena, Vinay, MD  doxycycline  (VIBRA -TABS) 100 MG tablet Take 1 tablet (100 mg total) by mouth 2 (two) times daily. 04/07/24   Alvia Bring, DO  letrozole  (FEMARA ) 2.5 MG tablet Take 1 tablet (2.5 mg total) by mouth daily. 03/27/24   Odean Potts, MD  LORazepam  (ATIVAN ) 0.5 MG tablet Take 1 tablet 1 hour before scan- may repeat at time of scan if needed. 10/25/23   Gudena, Vinay, MD  Spacer/Aero-Holding Chambers (AEROCHAMBER PLUS WITH MASK) inhaler Use with inhaler 02/22/23   Wachs, Erika S, DO  VERZENIO  100 MG tablet TAKE 1 TABLET BY MOUTH TWICE DAILY 03/08/24   Gudena, Vinay, MD  Vitamin D -Vitamin K (VITAMIN K2 -VITAMIN D3) 90-125 MCG CAPS Take 1 capsule by mouth daily at 12 noon. 12/21/23   Gudena, Vinay, MD    Allergies: Atorvastatin, Bactrim  [sulfamethoxazole -trimethoprim ], Clavulanic acid,  Guaifenesin & derivatives, and Tamoxifen    Review of Systems  Constitutional:  Negative for fever.    Updated Vital Signs BP 125/78 (BP Location: Right Arm)   Pulse 91   Temp (!) 97.4 F (36.3 C) (Axillary)   Resp 18   SpO2 98%   Physical Exam Vitals and nursing note reviewed.  Constitutional:      Appearance: Normal appearance.  HENT:     Head: Atraumatic.  Cardiovascular:     Rate and Rhythm: Normal rate and regular rhythm.  Pulmonary:     Effort: Pulmonary effort is normal.  Abdominal:     General: Abdomen is flat.     Palpations: Abdomen is soft.     Tenderness: There is no abdominal tenderness.     Comments: PEG tube out of place. PEG tube site with mild erythema from what looks like chronic skin irritation. No edema or purulence   Neurological:     General: No focal deficit present.     Mental Status: She is alert.  Psychiatric:        Mood and Affect: Mood normal.        Behavior: Behavior normal.       (all labs ordered are listed, but only abnormal results are displayed) Labs Reviewed - No data to display  EKG: None  Radiology: DG Abdomen PEG Tube Location Result Date: 05/05/2024 EXAM: 1 VIEW  XRAY OF THE ABDOMEN 05/05/2024 08:38:51 PM COMPARISON: 03/12/2024 CLINICAL HISTORY: PEG (percutaneous endoscopic gastrostomy) status (HCC) FINDINGS: LINES, TUBES AND DEVICES: Percutaneous gastrostomy tube with tip overlying the right lower abdomen. BOWEL: PO contrast noted administered via the gastrostomy tube with partial opacification of the duodenum, proximal ileum, and gastric lumen. Nonobstructive bowel gas pattern. SOFT TISSUES: No abnormal calcifications. BONES: Thoracolumbar osseous metastatic disease redemonstrated. IMPRESSION: 1. Percutaneous gastrostomy tube tip projects over the right lower abdomen with enteric contrast opacifying the stomach, duodenum, and proximal small bowel. 2. No acute abdominal abnormality on this single view. Electronically signed by:  Morgane Naveau MD MD 05/05/2024 08:45 PM EST RP Workstation: [id number]     .Gastrostomy tube replacement  Date/Time: 05/05/2024 10:16 PM  Performed by: Veta Palma, PA-C Authorized by: Veta Palma, PA-C  Consent: Verbal consent obtained Risks and benefits: risks, benefits and alternatives were discussed Consent given by: patient Patient understanding: patient states understanding of the procedure being performed Patient identity confirmed: verbally with patient and arm band Time out: Immediately prior to procedure a time out was called to verify the correct patient, procedure, equipment, support staff and site/side marked as required. Preparation: Patient was prepped and draped in the usual sterile fashion. Local anesthesia used: no  Anesthesia: Local anesthesia used: no  Sedation: Patient sedated: no  Patient tolerance: patient tolerated the procedure well with no immediate complications Comments: 31 French initially attempted without success, then 32 french with success      Medications Ordered in the ED  fentaNYL  (SUBLIMAZE ) injection 50 mcg (50 mcg Intramuscular Given 05/05/24 2000)  iohexol  (OMNIPAQUE ) 300 MG/ML solution 30 mL (30 mLs Per Tube Contrast Given 05/05/24 2022)                                    Medical Decision Making Amount and/or Complexity of Data Reviewed Radiology: ordered.  Risk Prescription drug management.     Differential diagnosis includes but is not limited to dislodged PEG tube, intra-abdominal infection, cellulitis   ED Course:  Upon initial evaluation, patient is well-appearing, stable vitals.  Has PEG tube out of place currently, has been out of place for about 2-3 hours.  The insertion site is clean appearing without significant erythema.  No purulence.  Patient does not have any abdominal tenderness to palpation. No concern for skin or intra-abdominal infection at this time. No indication for labs Patient consented and  PEG tube replaced here in the ER.  The original catheter size was a 20 French.  Unfortunately, we did not have this available.  22 French was initially attempted to be placed, but did not go in.  An 22 French was then placed by my attending Dr. Dreama.  Abdominal x-ray was obtained after PEG tube placement which confirmed proper placement.  Patient stable and appropriate for discharge home   Imaging Studies ordered: I ordered imaging studies including Abdominal x-ray  I independently visualized the imaging with scope of interpretation limited to determining acute life threatening conditions related to emergency care. Imaging showed  IMPRESSION:  1. Percutaneous gastrostomy tube tip projects over the right lower abdomen with  enteric contrast opacifying the stomach, duodenum, and proximal small bowel.  2. No acute abdominal abnormality on this single view.   I agree with the radiologist interpretation  Medications Given: Fentanyl   Impression: PEG tube dislodged  Disposition:  The patient was discharged home with instructions to follow up with  IR as needed for management of her PEG tube.  Return precautions given and patient verbalized understanding.    Record Review: External records from outside source obtained and reviewed including ER notes where she was seen for PEG tube coming out and needing it replaced     This chart was dictated using voice recognition software, Dragon. Despite the best efforts of this provider to proofread and correct errors, errors may still occur which can change documentation meaning.       Final diagnoses:  PEG tube malfunction [facility])    ED Discharge Orders     None          Veta Palma, [place] 05/05/24 2217  "

## 2024-05-05 NOTE — ED Triage Notes (Signed)
 Pt reports that peg tube fell out again today @ 1500, pt reports hx of same

## 2024-05-05 NOTE — Discharge Instructions (Addendum)
 Your PEG tube was replaced today.   Please follow up with your IR team as needed for further management of your tube.  Please return to the ER for any other new or concerning symptoms

## 2024-05-19 ENCOUNTER — Telehealth: Payer: Self-pay

## 2024-05-19 NOTE — Progress Notes (Signed)
 Pt called and reported diarrhea and what she believes to be a spastic bowel. She suspects it may be related to her treatment. Per MD RN advised pt to hold letrozole  for 2 weeks and transferred pt to PharmD Norleen Parkinson for advice on whether or not she should hold her Verzenio  as well. RN scheduled f/u w/ Odean MD to discuss whether holding anastrazole helped alleviate her sx and what further steps to take.

## 2024-05-19 NOTE — Progress Notes (Signed)
 error

## 2024-05-23 ENCOUNTER — Telehealth: Payer: Self-pay

## 2024-05-23 ENCOUNTER — Encounter: Payer: Self-pay | Admitting: Hematology and Oncology

## 2024-05-23 ENCOUNTER — Inpatient Hospital Stay

## 2024-05-23 ENCOUNTER — Telehealth: Payer: Self-pay | Admitting: Pharmacist

## 2024-05-23 NOTE — Telephone Encounter (Signed)
 RN called both pt and her daughter Teckla Christiansen and LVM w/ [name] Sukup informing that we've been trying to contact her mother and asking her to call us  back. [place]  [place] voicemail box is full.

## 2024-05-23 NOTE — Telephone Encounter (Signed)
 error

## 2024-05-23 NOTE — Telephone Encounter (Signed)
 [facility]        Telephone: [phone]?Fax: [phone]   Oncology Clinical Pharmacist Practitioner Progress Note   Jodi  Kaufman is a 84 y.o. female with a diagnosis of metastatic breast cancer currently on abemaciclib  + letrozole  + denosumab  120 mg under the care of Dr. [name].   Jodi Kaufman contacted Dr. Chad' clinic on Friday and talked with his nurse regarding diarrhea. She was told to hold letrozole  and discuss with pharmacy about her abemaciclib . Jodi Kaufman then left a v/m on 05/19/24 and she was called back today as we were out of the office until today.   We were unable to leave a v/m with patient due to mailbox being full and we were unable to reach Jodi Kaufman (daughter). This was relayed to Dr. Gara nurse. We will instead try to reach her via MyChart to let her know to hold abemaciclib  until symptoms resolve although low suspicion this is due to abemaciclib  since she has been on it since 12/02/23 and diarrhea would usually subside by now.  Reviewed with Dr.Gudena and nurse our recommendations and if she contacts the clinic back she should be assessed for dehydration. If symptoms have not resolved, consider appt with [facility] for labs and IVF/electrolytes if needed.  She next sees Dr. Gudena on 06/03/24 and we will have scheduling order labs prior to this visit.  Clinical pharmacy will continue to support Jodi  Kaufman and Dr. [name] as needed.  Jodi Kaufman, RPH-CPP,  05/23/2024  9:24 AM

## 2024-05-24 ENCOUNTER — Telehealth: Payer: Self-pay

## 2024-05-24 NOTE — Telephone Encounter (Signed)
 Pt called and LVM stating that she was trying to return our call regarding her ongoing diarrhea issues. She reported that she stopped taking her Letrozole  but is still taking her Verzenio . RN attempted to call her to inform her that Norleen advised she stop taking her Verzenio  as well however pt did not answer and VM full. Pt needs to be assessed for sx related to her diarrhea, as well as when it began, has she taken anything for relief? RN attempted to call her X2.

## 2024-05-30 ENCOUNTER — Encounter: Payer: Self-pay | Admitting: Family Medicine

## 2024-05-30 ENCOUNTER — Telehealth: Admitting: Family Medicine

## 2024-05-30 ENCOUNTER — Ambulatory Visit: Payer: Self-pay

## 2024-05-30 ENCOUNTER — Telehealth: Payer: Self-pay | Admitting: *Deleted

## 2024-05-30 DIAGNOSIS — J439 Emphysema, unspecified: Secondary | ICD-10-CM | POA: Diagnosis not present

## 2024-05-30 DIAGNOSIS — J019 Acute sinusitis, unspecified: Secondary | ICD-10-CM | POA: Diagnosis not present

## 2024-05-30 DIAGNOSIS — B9689 Other specified bacterial agents as the cause of diseases classified elsewhere: Secondary | ICD-10-CM | POA: Diagnosis not present

## 2024-05-30 MED ORDER — AMOXICILLIN 875 MG PO TABS: 875.0000 mg | ORAL_TABLET | Freq: Two times a day (BID) | ORAL | 0 refills | Status: AC

## 2024-05-30 NOTE — Telephone Encounter (Signed)
 The patient had an appointment with the provider today for the following symptoms. Note will be closed.

## 2024-05-30 NOTE — Assessment & Plan Note (Signed)
 No significant change in sxs. Some cough but feels like more in upper airway.

## 2024-05-30 NOTE — Progress Notes (Signed)
 "                    MyChart Video Visit    Virtual Visit via Video Note   This format is felt to be most appropriate for this patient at this time. Physical exam was limited by quality of the video and audio technology used for the visit.    Patient location: home Provider location: [place] [facility] [facility] Persons involved in the visit: patient, provider  I discussed the limitations of evaluation and management by telemedicine and the availability of in person appointments. The patient expressed understanding and agreed to proceed.  Patient: Jodi  Kaufman   DOB: 1940/08/14   84 y.o. Female  MRN: [id number] Visit Date: 05/30/2024  Today's healthcare provider: Dorothyann Byars, MD   No chief complaint on file.   Subjective:    HPI  Pt reports green gunk coming up for sometime now she said that this has been going on everyday for weeks now. She denies any f/s/c. Has sinusitis back in early Dec andtx'd with doxy and did feel about 99% better until about 3 weeks ago. Sig sinus congestion. + post nasal drip.  Lives with dtr who uses plug-in scent products ands she feels that makes  her congestion worse.  Because has been blood-tinged at times.  Has felt very nauseated she feels like a lot of mucus just drains into her stomach and then upsets her stomach.  She denies any recent fevers she does report some discomfort in her left ear.   She stated that 2 weeks ago she was taking the Letrozole  but was told to d/c due to it causing diarrhea. Asked if this should be added to her allergy/intolerance list she stated that she will speak with Dr. Gudena about this.    Reports a couple weeks ago she woke up and all her tongue toes were numb.  It did resolve she has had a couple episodes where she is woken up and had some weakness and could not move her legs she says.  But says she will make a different appointment for that it seems to come and  go.  ROS       Objective:    There were no vitals taken for this visit.      Physical Exam Vitals reviewed.  Constitutional:      Appearance: Normal appearance.  HENT:     Head: Normocephalic.  Pulmonary:     Effort: Pulmonary effort is normal.  Neurological:     Mental Status: She is alert and oriented to person, place, and time.  Psychiatric:        Mood and Affect: Mood normal.        Behavior: Behavior normal.         Assessment & Plan:    Problem List Items Addressed This Visit       Respiratory   Mild emphysema (HCC) - Primary   No significant change in sxs. Some cough but feels like more in upper airway.       Other Visit Diagnoses       Acute bacterial sinusitis       Relevant Medications   amoxicillin  (AMOXIL ) 875 MG tablet       Acute sinusitis - will tx with amox since recently had doxy.  Call I better you.      She knows to hold the Verzenio  while she is on antibiotics.  Then she  can restart the medication.  Also recommend a trial of nasal saline irrigation as well as running a humidifier and staying well-hydrated by drinking plenty of fluids.   Meds ordered this encounter  Medications   amoxicillin  (AMOXIL ) 875 MG tablet    Sig: Take 1 tablet (875 mg total) by mouth 2 (two) times daily.    Dispense:  14 tablet    Refill:  0     No follow-ups on file.     I discussed the assessment and treatment plan with the patient. The patient was provided an opportunity to ask questions and all were answered. The patient agreed with the plan and demonstrated an understanding of the instructions.   The patient was advised to call back or seek an in-person evaluation if the symptoms worsen or if the condition fails to improve as anticipated.  I provided 20 minutes of non-face-to-face time during this encounter.  Dorothyann Byars, MD [facility] at [facility]    "

## 2024-05-30 NOTE — Progress Notes (Signed)
 Pt reports green gunk coming up for sometime now she said that this has been going on everyday for weeks now. She denies any f/s/c.   She stated that 2 weeks ago she was taking the Letrozole  but was told to d/c due to it causing diarrhea. Asked if this should be added to her allergy/intolerance list she stated that she will speak with Dr. Gudena about this.

## 2024-05-31 ENCOUNTER — Telehealth: Payer: Self-pay | Admitting: Pharmacist

## 2024-05-31 NOTE — Telephone Encounter (Signed)
 [facility]        Telephone: [phone]?Fax: [phone]   Oncology Clinical Pharmacist Practitioner Progress Note   Jodi Kaufman is a 84 y.o. female with a diagnosis of metastatic breast cancer currently on abemaciclib  + letrozole  + denosumab  120 mg under the care of Dr. [name].   I connected with Jodi Kaufman today by telephone and verified that I was speaking with the correct person using two patient identifiers. I discussed the limitations, risks, security and privacy concerns of performing an evaluation and management service by telemedicine and the availability of in-person appointments. The patient/caregiver expressed understanding and agreed to proceed.  Other persons participating in the visit and their role in the encounter: none   Patients location: home  Providers location: clinic  [name] had contacted the oral chemotherapy office. We contacted her back and she explained that she is starting amoxicillin  today as her daughter was not able to pick up yesterday. It is for a 7-day supply. She will hold abemaciclib  while on amoxicillin  which will finish up on 06/06/24. If she is feeling better at that time, she will restart abemaciclib . We instructed her to contact Dr. Gara clinic at [phone] if worsening symptoms or no improvement and that number would be better to call as someone will respond quickly, even if after hours.   Her next appt with Dr. Gudena is scheduled for 06/13/24. She could also consider holding abemaciclib  until that next visit if needed.  Jodi Kaufman participated in the discussion, expressed understanding, and voiced agreement with the above plan. All questions were answered to their satisfaction. The patient was advised to contact the clinic at [phone] with any questions or concerns prior to their return visit.  Clinical pharmacy will continue to support Jodi Kaufman and Dr. [name] as needed.  Torrie Namba A.  Kaufman, PharmD, BCOP, CPP  Jodi Kaufman, RPH-CPP,  05/31/2024  4:02 PM   **Disclaimer: This note was dictated with voice recognition software. Similar sounding words can inadvertently be transcribed and this note may contain transcription errors which may not have been corrected upon publication of note.**

## 2024-06-13 ENCOUNTER — Inpatient Hospital Stay: Attending: Hematology and Oncology | Admitting: Hematology and Oncology

## 2024-06-13 ENCOUNTER — Inpatient Hospital Stay

## 2024-07-20 ENCOUNTER — Inpatient Hospital Stay: Admitting: Hematology and Oncology

## 2024-07-20 ENCOUNTER — Inpatient Hospital Stay
